# Patient Record
Sex: Female | Born: 1950
Health system: Southern US, Community
[De-identification: ages and names within clinical notes are randomized; demographics above are authoritative.]

## PROBLEM LIST (undated history)

## (undated) DIAGNOSIS — Z9289 Personal history of other medical treatment: Secondary | ICD-10-CM

## (undated) DIAGNOSIS — E079 Disorder of thyroid, unspecified: Secondary | ICD-10-CM

## (undated) DIAGNOSIS — J45909 Unspecified asthma, uncomplicated: Secondary | ICD-10-CM

## (undated) DIAGNOSIS — C55 Malignant neoplasm of uterus, part unspecified: Secondary | ICD-10-CM

## (undated) DIAGNOSIS — G709 Myoneural disorder, unspecified: Secondary | ICD-10-CM

## (undated) DIAGNOSIS — K219 Gastro-esophageal reflux disease without esophagitis: Secondary | ICD-10-CM

## (undated) DIAGNOSIS — E039 Hypothyroidism, unspecified: Secondary | ICD-10-CM

## (undated) DIAGNOSIS — M1990 Unspecified osteoarthritis, unspecified site: Secondary | ICD-10-CM

## (undated) DIAGNOSIS — I890 Lymphedema, not elsewhere classified: Secondary | ICD-10-CM

## (undated) HISTORY — PX: ABDOMINAL HYSTERECTOMY: SHX81

## (undated) HISTORY — PX: COLONOSCOPY W/ POLYPECTOMY: SHX1380

## (undated) HISTORY — PX: APPENDECTOMY: SHX54

## (undated) HISTORY — DX: Unspecified asthma, uncomplicated: J45.909

## (undated) HISTORY — DX: Disorder of thyroid, unspecified: E07.9

## (undated) HISTORY — PX: OTHER SURGICAL HISTORY: SHX169

## (undated) HISTORY — PX: LIVER BIOPSY: SHX301

## (undated) HISTORY — PX: EYE SURGERY: SHX253

## (undated) HISTORY — DX: Unspecified osteoarthritis, unspecified site: M19.90

## (undated) HISTORY — DX: Malignant neoplasm of uterus, part unspecified: C55

## (undated) HISTORY — DX: Myoneural disorder, unspecified: G70.9

---

## 2013-12-29 ENCOUNTER — Ambulatory Visit: Payer: Self-pay | Admitting: Gynecologic Oncology

## 2014-01-05 ENCOUNTER — Ambulatory Visit: Payer: Self-pay | Admitting: Gynecologic Oncology

## 2014-01-26 ENCOUNTER — Telehealth (HOSPITAL_COMMUNITY): Payer: Self-pay | Admitting: *Deleted

## 2015-05-28 ENCOUNTER — Encounter: Payer: Self-pay | Admitting: Gastroenterology

## 2015-06-03 ENCOUNTER — Encounter: Payer: Self-pay | Admitting: Gastroenterology

## 2015-06-03 ENCOUNTER — Other Ambulatory Visit (INDEPENDENT_AMBULATORY_CARE_PROVIDER_SITE_OTHER): Payer: BLUE CROSS/BLUE SHIELD

## 2015-06-03 ENCOUNTER — Ambulatory Visit (INDEPENDENT_AMBULATORY_CARE_PROVIDER_SITE_OTHER): Payer: BLUE CROSS/BLUE SHIELD | Admitting: Gastroenterology

## 2015-06-03 VITALS — BP 120/70 | HR 86 | Ht 62.0 in | Wt 163.8 lb

## 2015-06-03 DIAGNOSIS — R197 Diarrhea, unspecified: Secondary | ICD-10-CM

## 2015-06-03 DIAGNOSIS — C55 Malignant neoplasm of uterus, part unspecified: Secondary | ICD-10-CM

## 2015-06-03 DIAGNOSIS — R195 Other fecal abnormalities: Secondary | ICD-10-CM | POA: Diagnosis not present

## 2015-06-03 LAB — IGA: IgA: 108 mg/dL (ref 68–378)

## 2015-06-03 NOTE — Progress Notes (Signed)
HPI :  64 y/o female referred for heme-positive stool, diarrhea, and GERD from Dr. Shon Baton.  She denies blood in the stools. She had a (+) FOBT per Dr. Virgina Jock - resulted 05/17/15.  The patient reports she otheriwise has some bowel habit changes in relation to what she eats and stress levels. She reports episodes of diarrhea with urgency, after eating within 15-20 minutes she has an urge to have a BM, loose watery stools. She reports she has high frequency stools during this time. She has some abdominal cramping with this. No nausea or vomiting. She eats okay otherwise. She has some heartburn which bothers her, she thinks related to what she eats. No dysphagia. She has a strong gag reflux when she brushes her teeth which is chronic. She does not take any medications for heartburn at this time. She was on nexium remotely which she reported helped significantly, but she thinks it caused diarrhea at the time. She has had diarrhea ongoing for a few years at least. She reports most recently she had loose stools consistently for 3 weeks, and then some days of being symptom free. She has had some periods of time with nocturnal symptoms of loose stools that have woken her from sleep. She has had a few episodes of incontinence, unable to get to the restroom in time due to strong urge.   She has had synthroid started a few months ago. She has not noticed any changes in her bowels associated with this. She takes advil PRN, not routinely. She has lost a few lbs she thinks over the years with this symptom.   She has a history of uterine cancer, in remission, followed by oncology. She reports she has had multiple CT scans in the past year which have been normal and no concerning evidence for colon cancer or inflammation.   Last colonoscopy 2010 done in [place], report not available. She reports it was a normal exam. No FH of CRC. No FH of Crohns or celiac dz.   Past Medical History  Diagnosis Date  . Arthritis    . Uterine cancer ([place])   . Thyroid disease      Past Surgical History  Procedure Laterality Date  . Abdominal hysterectomy    . Appendectomy    . Liver biopsy    . Mass removed      2015 from pelvis   Family History  Problem Relation Age of Onset  . Heart disease Mother   . Multiple myeloma Mother    Social History  Substance Use Topics  . Smoking status: Never Smoker   . Smokeless tobacco: Never Used  . Alcohol Use: No   Current Outpatient Prescriptions  Medication Sig Dispense Refill  . levothyroxine (SYNTHROID, LEVOTHROID) 50 MCG tablet Take 50 mcg by mouth daily before breakfast.     No current facility-administered medications for this visit.   No Known Allergies   Review of Systems: All systems reviewed and negative except where noted in HPI.   L[facility]:  Labs 05/12/15  CBC - Hgb 13.3, MCV 96, WBC 4.7 Normal LFTs and renal panel  Physical Exam: BP 120/70 mmHg  Pulse 86  Ht 5' 2"  (1.575 m)  Wt 163 lb 12.8 oz (74.299 kg)  BMI 29.95 kg/m2  SpO2 97% Constitutional: Pleasant,well-developed, female in no acute distress. HEENT: Normocephalic and atraumatic. Conjunctivae are normal. No scleral icterus. Neck supple.  Cardiovascular: Normal rate, regular rhythm.  Pulmonary/chest: Effort normal and breath  sounds normal. No wheezing, rales or rhonchi. Abdominal: Soft, nondistended, nontender. Bowel sounds active throughout. There are no masses palpable. No hepatomegaly. Extremities: left leg (+) 1-2 edema, R leg with trace edema - stable per patient Lymphadenopathy: No cervical adenopathy noted. Neurological: Alert and oriented to person place and time. Skin: Skin is warm and dry. No rashes noted. Psychiatric: Normal mood and affect. Behavior is normal.   ASSESSMENT AND PLAN: 64 y/o female with a history of uterine cancer referred for (+) FOBT in the setting of diarrhea with increased fecal urgency. No overt rectal bleeding.  Symptoms of diarrhea have been longstanding.   I have discussed (+) FOBT and differential for this. I am recommending she have a colonoscopy to ensure no evidence of bleeding polyp / mass lesion, and to ensure no inflammatory changes / IBD in light of her symptoms. Following a discussion of the risks / benefits of colonoscopy, she did not want to go through with it and declined colonoscopy. I counseled her extensively on why I am recommending this in light of the FOBT, and she verbalized understanding, and declined. She reports she has had a lot of testing done for her uterine cancer and wants a "break" from procedures. She said she will think about it and let me know if she changes her mind. I provided her our contact information.   Otherwise, given her ongoing diarrhea, recommend celiac serology to ensure negative and I offered her a fecal calprotectin to assess for inflammation. If the fecal calprotectin is positive she said she may reconsider colonoscopy to rule out IBD. I otherwise counseled her on a low FODMOP diet, and she can try using immodium PRN for her symptoms. I will let her know results of labs. She will think about colonoscopy and let me know if and when she wishes to proceed with it.   [place] Cellar, MD [facility] Pager [phone]  CC: Dr. Shon Baton

## 2015-06-03 NOTE — Patient Instructions (Addendum)
Please start Low FODMAP diet.   Your physician has requested that you go to the basement for lab work before leaving today.  .It has been recommended to you by your physician that you have a(n) Colonoscopy completed. Per your request, we did not schedule the procedure(s) today. Please contact our office at [phone] should you decide to have the procedure completed.

## 2015-06-04 ENCOUNTER — Other Ambulatory Visit: Payer: BLUE CROSS/BLUE SHIELD

## 2015-06-04 DIAGNOSIS — R195 Other fecal abnormalities: Secondary | ICD-10-CM

## 2015-06-04 DIAGNOSIS — R197 Diarrhea, unspecified: Secondary | ICD-10-CM

## 2015-06-04 LAB — TISSUE TRANSGLUTAMINASE, IGA: Tissue Transglutaminase Ab, IgA: 1 U/mL (ref <4)

## 2015-06-17 ENCOUNTER — Telehealth: Payer: Self-pay | Admitting: Gastroenterology

## 2015-06-17 NOTE — Telephone Encounter (Signed)
Patient given lab results. Patient told that stool study is not back yet.

## 2015-06-23 LAB — CALPROTECTIN, FECAL: Calprotectin, Fecal: 52 ug/g (ref 0–120)

## 2016-05-19 ENCOUNTER — Telehealth (INDEPENDENT_AMBULATORY_CARE_PROVIDER_SITE_OTHER): Payer: Self-pay | Admitting: Orthopedic Surgery

## 2016-05-19 ENCOUNTER — Telehealth (INDEPENDENT_AMBULATORY_CARE_PROVIDER_SITE_OTHER): Payer: Self-pay | Admitting: Orthopaedic Surgery

## 2016-05-19 NOTE — Telephone Encounter (Signed)
Pt called would like to have hip replacement surgery before end of year but would also like cb with [phone]

## 2016-05-19 NOTE — Telephone Encounter (Signed)
Dana [name] sees Dr. Erlinda Hong for her hip.

## 2016-05-22 ENCOUNTER — Telehealth (INDEPENDENT_AMBULATORY_CARE_PROVIDER_SITE_OTHER): Payer: Self-pay | Admitting: Orthopaedic Surgery

## 2016-05-25 NOTE — Telephone Encounter (Signed)
I called patient and discuss surgery cost.   I made patient an appointment with Dr. Erlinda Hong to come in to further discuss.

## 2016-06-01 ENCOUNTER — Encounter (INDEPENDENT_AMBULATORY_CARE_PROVIDER_SITE_OTHER): Payer: Self-pay | Admitting: Orthopaedic Surgery

## 2016-06-01 ENCOUNTER — Encounter (INDEPENDENT_AMBULATORY_CARE_PROVIDER_SITE_OTHER): Payer: Self-pay

## 2016-06-01 ENCOUNTER — Ambulatory Visit (INDEPENDENT_AMBULATORY_CARE_PROVIDER_SITE_OTHER): Payer: BLUE CROSS/BLUE SHIELD | Admitting: Orthopaedic Surgery

## 2016-06-01 DIAGNOSIS — M1611 Unilateral primary osteoarthritis, right hip: Secondary | ICD-10-CM | POA: Insufficient documentation

## 2016-06-01 NOTE — Progress Notes (Signed)
Office Visit Note   Patient: Dana [name]           Date of Birth: [birth year]           MRN: [id number] Visit Date: 06/01/2016              Requested by: No referring provider defined for this encounter. PCP: Precious Reel, MD   Assessment & Plan: Visit Diagnoses:  1. Primary osteoarthritis of right hip     Plan:  Total face to face encounter time was greater than 25 minutes and over half of this time was spent in counseling and/or coordination of care. Today we had a long discussion about expectations from a hip replacement surgery including the risks benefits alternatives to surgery. She understands these risks and she wishes to proceed. I did show her on a model today with the surgery entails and where the incision would be placed. She denies a history of DVT. She would like to have the surgery done around Christmas time but before the end of the year. We will get her set up at her convenience.  Follow-Up Instructions: Return for will call patient to set up postop visit.   Orders:  No orders of the defined types were placed in this encounter.  No orders of the defined types were placed in this encounter.     Procedures: No procedures performed   Clinical Data: No additional findings.   Subjective: Chief Complaint  Patient presents with  . Right Hip - Pain    The patient follows up today for continued and worsening right hip pain related to her osteoarthritis. She is limping significantly. Her quality of life has deteriorated. She is interested in discussing surgery. She denies any back pain. She does endorse groin pain. The pain does not radiate.    Review of Systems  Constitutional: Negative.   HENT: Negative.   Eyes: Negative.   Respiratory: Negative.   Cardiovascular: Negative.   Endocrine: Negative.   Musculoskeletal: Negative.   Neurological: Negative.   Hematological: Negative.   Psychiatric/Behavioral: Negative.   All other systems reviewed and are  negative.    Objective: Vital Signs: There were no vitals taken for this visit.  Physical Exam  Constitutional: She is oriented to person, place, and time. She appears well-developed and well-nourished.  HENT:  Head: Atraumatic.  Eyes: EOM are normal.  Neck: Neck supple.  Cardiovascular: Intact distal pulses.   Pulmonary/Chest: Effort normal.  Abdominal: Soft.  Neurological: She is alert and oriented to person, place, and time.  Skin: Skin is warm. Capillary refill takes less than 2 seconds.  Psychiatric: She has a normal mood and affect. Her behavior is normal. Judgment and thought content normal.  Nursing note and vitals reviewed.   Ortho Exam Examination of the right hip shows no skin changes. She has significant pain with rotation of her hip. She has a positive Stinchfield sign. No sciatic tension signs. No radicular signs. The right lower extremity is neurovascularly intact. There is no knee pain. Specialty Comments:  No specialty comments available.  Imaging: Previous pelvis x-ray shows advanced arthritis of the right hip.   PMFS History: Patient Active Problem List   Diagnosis Date Noted  . Primary osteoarthritis of right hip 06/01/2016  . Uterine cancer ([place]) 06/03/2015  . Diarrhea 06/03/2015   Past Medical History:  Diagnosis Date  . Arthritis   . Thyroid disease   . Uterine cancer [facility])     Family History  Problem Relation  Age of Onset  . Heart disease Mother   . Multiple myeloma Mother     Past Surgical History:  Procedure Laterality Date  . ABDOMINAL HYSTERECTOMY    . APPENDECTOMY    . LIVER BIOPSY    . mass removed     2015 from pelvis   Social History   Occupational History  . food Nature conservation officer    Social History Main Topics  . Smoking status: Never Smoker  . Smokeless tobacco: Never Used  . Alcohol use No  . Drug use: No  . Sexual activity: Not on file

## 2016-06-02 ENCOUNTER — Ambulatory Visit (INDEPENDENT_AMBULATORY_CARE_PROVIDER_SITE_OTHER): Payer: Self-pay | Admitting: Orthopaedic Surgery

## 2016-06-05 ENCOUNTER — Other Ambulatory Visit: Payer: Self-pay | Admitting: Internal Medicine

## 2016-06-05 DIAGNOSIS — E010 Iodine-deficiency related diffuse (endemic) goiter: Secondary | ICD-10-CM

## 2016-06-13 ENCOUNTER — Ambulatory Visit
Admission: RE | Admit: 2016-06-13 | Discharge: 2016-06-13 | Disposition: A | Discharge status: Home / Self Care | Payer: BLUE CROSS/BLUE SHIELD | Source: Ambulatory Visit | Attending: Internal Medicine | Admitting: Internal Medicine

## 2016-06-13 DIAGNOSIS — E010 Iodine-deficiency related diffuse (endemic) goiter: Secondary | ICD-10-CM

## 2016-06-21 ENCOUNTER — Other Ambulatory Visit (INDEPENDENT_AMBULATORY_CARE_PROVIDER_SITE_OTHER): Payer: Self-pay | Admitting: Orthopaedic Surgery

## 2016-06-21 DIAGNOSIS — M1612 Unilateral primary osteoarthritis, left hip: Secondary | ICD-10-CM

## 2016-06-22 ENCOUNTER — Telehealth (INDEPENDENT_AMBULATORY_CARE_PROVIDER_SITE_OTHER): Payer: Self-pay | Admitting: Orthopaedic Surgery

## 2016-06-22 NOTE — Telephone Encounter (Signed)
Patient called asked if the FMLA papers were received  From [facility] claims management services.  The number to contact her is [phone]

## 2016-06-23 NOTE — Telephone Encounter (Signed)
Called pt no answer LMOM we received forms and I will fill out and fax with notes.

## 2016-06-26 ENCOUNTER — Other Ambulatory Visit (HOSPITAL_COMMUNITY): Payer: BLUE CROSS/BLUE SHIELD

## 2016-06-30 ENCOUNTER — Encounter (HOSPITAL_COMMUNITY): Payer: Self-pay

## 2016-07-03 ENCOUNTER — Other Ambulatory Visit (INDEPENDENT_AMBULATORY_CARE_PROVIDER_SITE_OTHER): Payer: Self-pay | Admitting: Orthopaedic Surgery

## 2016-07-03 ENCOUNTER — Encounter (HOSPITAL_COMMUNITY): Payer: Self-pay

## 2016-07-03 ENCOUNTER — Encounter (HOSPITAL_COMMUNITY)
Admission: RE | Admit: 2016-07-03 | Discharge: 2016-07-03 | Disposition: A | Discharge status: Home / Self Care | Payer: BLUE CROSS/BLUE SHIELD | Source: Ambulatory Visit | Attending: Orthopaedic Surgery | Admitting: Orthopaedic Surgery

## 2016-07-03 DIAGNOSIS — K219 Gastro-esophageal reflux disease without esophagitis: Secondary | ICD-10-CM | POA: Insufficient documentation

## 2016-07-03 DIAGNOSIS — E079 Disorder of thyroid, unspecified: Secondary | ICD-10-CM | POA: Insufficient documentation

## 2016-07-03 DIAGNOSIS — Z01812 Encounter for preprocedural laboratory examination: Secondary | ICD-10-CM | POA: Diagnosis present

## 2016-07-03 DIAGNOSIS — R197 Diarrhea, unspecified: Secondary | ICD-10-CM | POA: Diagnosis not present

## 2016-07-03 DIAGNOSIS — C55 Malignant neoplasm of uterus, part unspecified: Secondary | ICD-10-CM | POA: Insufficient documentation

## 2016-07-03 DIAGNOSIS — M1611 Unilateral primary osteoarthritis, right hip: Secondary | ICD-10-CM | POA: Insufficient documentation

## 2016-07-03 HISTORY — DX: Personal history of other medical treatment: Z92.89

## 2016-07-03 HISTORY — DX: Lymphedema, not elsewhere classified: I89.0

## 2016-07-03 HISTORY — DX: Gastro-esophageal reflux disease without esophagitis: K21.9

## 2016-07-03 HISTORY — DX: Hypothyroidism, unspecified: E03.9

## 2016-07-03 LAB — COMPREHENSIVE METABOLIC PANEL
ALT: 14 U/L (ref 14–54)
AST: 17 U/L (ref 15–41)
Albumin: 3.7 g/dL (ref 3.5–5.0)
Alkaline Phosphatase: 63 U/L (ref 38–126)
Anion gap: 7 (ref 5–15)
BUN: 14 mg/dL (ref 6–20)
CO2: 27 mmol/L (ref 22–32)
Calcium: 9.2 mg/dL (ref 8.9–10.3)
Chloride: 104 mmol/L (ref 101–111)
Creatinine, Ser: 0.87 mg/dL (ref 0.44–1.00)
GFR calc Af Amer: >60 mL/min (ref >60)
GFR calc non Af Amer: >60 mL/min (ref >60)
Glucose, Bld: 92 mg/dL (ref 65–99)
Potassium: 4.4 mmol/L (ref 3.5–5.1)
Sodium: 138 mmol/L (ref 135–145)
Total Bilirubin: 0.8 mg/dL (ref 0.3–1.2)
Total Protein: 6.1 g/dL — ABNORMAL LOW (ref 6.5–8.1)

## 2016-07-03 LAB — CBC WITH DIFFERENTIAL/PLATELET
Basophils Absolute: 0 10*3/uL (ref 0.0–0.1)
Basophils Relative: 1 %
Eosinophils Absolute: 0.1 10*3/uL (ref 0.0–0.7)
Eosinophils Relative: 2 %
HCT: 42.4 % (ref 36.0–46.0)
Hemoglobin: 14 g/dL (ref 12.0–15.0)
Lymphocytes Relative: 31 %
Lymphs Abs: 1.9 10*3/uL (ref 0.7–4.0)
MCH: 30.4 pg (ref 26.0–34.0)
MCHC: 33 g/dL (ref 30.0–36.0)
MCV: 92 fL (ref 78.0–100.0)
Monocytes Absolute: 0.5 10*3/uL (ref 0.1–1.0)
Monocytes Relative: 8 %
Neutro Abs: 3.5 10*3/uL (ref 1.7–7.7)
Neutrophils Relative %: 58 %
Platelets: 262 10*3/uL (ref 150–400)
RBC: 4.61 MIL/uL (ref 3.87–5.11)
RDW: 13.5 % (ref 11.5–15.5)
WBC: 6 10*3/uL (ref 4.0–10.5)

## 2016-07-03 LAB — URINALYSIS, ROUTINE W REFLEX MICROSCOPIC
Bacteria, UA: NONE SEEN
Bilirubin Urine: NEGATIVE
Glucose, UA: NEGATIVE mg/dL
Ketones, ur: NEGATIVE mg/dL
Leukocytes, UA: NEGATIVE
Nitrite: NEGATIVE
Protein, ur: NEGATIVE mg/dL
Specific Gravity, Urine: 1.016 (ref 1.005–1.030)
Squamous Epithelial / LPF: NONE SEEN
pH: 5 (ref 5.0–8.0)

## 2016-07-03 LAB — APTT: aPTT: 31 seconds (ref 24–36)

## 2016-07-03 LAB — SURGICAL PCR SCREEN
MRSA, PCR: NEGATIVE
Staphylococcus aureus: NEGATIVE

## 2016-07-03 LAB — C-REACTIVE PROTEIN: CRP: <0.8 mg/dL (ref <1.0)

## 2016-07-03 LAB — TYPE AND SCREEN
ABO/RH(D): O POS
Antibody Screen: NEGATIVE

## 2016-07-03 LAB — PROTIME-INR
INR: 1
Prothrombin Time: 13.2 seconds (ref 11.4–15.2)

## 2016-07-03 LAB — ABO/RH: ABO/RH(D): O POS

## 2016-07-03 LAB — SEDIMENTATION RATE: Sed Rate: 6 mm/hr (ref 0–22)

## 2016-07-03 NOTE — Pre-Procedure Instructions (Addendum)
    Dana [name]  07/03/2016    Your procedure is scheduled on Thursday, December 28.  Report to [facility] Admitting at 10:15 AM              Your surgery or procedure is scheduled for 12:15 PM   Call this number if you have problems the morning of surgery: [phone]   Remember:  Do not eat food or drink liquids after midnight December 27.  Take these medicines the morning of surgery with A SIP OF WATER: levothyroxine (SYNTHROID, LEVOTHROID)     1 Week prior to surgery STOP taking Aspirin, Aspirin Products (Goody Powder, Excedrin Migraine), Ibuprofen (Advil), Naproxen (Aleve), Vitamins and Herbal Products (ie Fish Oil)  Do not wear jewelry, make-up or nail polish.  Do not wear lotions, powders, or perfumes, or deodorant.  Do not shave 48 hours prior to surgery.    Do not bring valuables to the hospital.  [facility] is not responsible for any belongings or valuables.  Contacts, dentures or bridgework may not be worn into surgery.  Leave your suitcase in the car.  After surgery it may be brought to your room.  For patients admitted to the hospital, discharge time will be determined by your treatment team.  Special instructions:  Review  [facility] - Preparing For Surgery.  Please read over the following fact sheets that you were given: .[facility]- Preparing For Surgery and Patient Instructions for Mupirocin Application, Incentive Spirometry, Pain Booklet

## 2016-07-03 NOTE — Progress Notes (Signed)
Dana [name] has Lymphoedema in left leg, TED hose make it worse.I called and left a message for Dr Erlinda Hong, with his scheduler to see if he wants her to wear TED hose on right leg only.

## 2016-07-07 ENCOUNTER — Telehealth (INDEPENDENT_AMBULATORY_CARE_PROVIDER_SITE_OTHER): Payer: Self-pay | Admitting: Orthopaedic Surgery

## 2016-07-07 NOTE — Telephone Encounter (Signed)
Pt requesting back about her fmla forms they have some sort of urgency. Her number is  [phone]

## 2016-07-12 MED ORDER — SODIUM CHLORIDE 0.9 % IV SOLN
1000.0000 mg | INTRAVENOUS | Status: AC
  Administered 2016-07-13: 1000 mg via INTRAVENOUS
  Filled 2016-07-12: qty 10

## 2016-07-12 NOTE — Telephone Encounter (Signed)
Pt called requesting FMLA papers. She is having surgery tomorrow. Please call pt when forms are ready for pick up.

## 2016-07-12 NOTE — Telephone Encounter (Signed)
Called pt forms are done. She would like for me to fax and also mail her a copy.

## 2016-07-13 ENCOUNTER — Inpatient Hospital Stay (HOSPITAL_COMMUNITY): Payer: BLUE CROSS/BLUE SHIELD | Admitting: Certified Registered"

## 2016-07-13 ENCOUNTER — Encounter (HOSPITAL_COMMUNITY): Payer: Self-pay | Admitting: *Deleted

## 2016-07-13 ENCOUNTER — Inpatient Hospital Stay (HOSPITAL_COMMUNITY)
Admission: RE | Admit: 2016-07-13 | Discharge: 2016-07-15 | DRG: 470 | Disposition: A | Discharge status: Home Health | Payer: BLUE CROSS/BLUE SHIELD | Source: Ambulatory Visit | Attending: Orthopaedic Surgery | Admitting: Orthopaedic Surgery

## 2016-07-13 ENCOUNTER — Encounter (HOSPITAL_COMMUNITY)
Admission: RE | Disposition: A | Discharge status: Home Health | Payer: Self-pay | Source: Ambulatory Visit | Attending: Orthopaedic Surgery

## 2016-07-13 ENCOUNTER — Inpatient Hospital Stay (HOSPITAL_COMMUNITY): Payer: BLUE CROSS/BLUE SHIELD

## 2016-07-13 DIAGNOSIS — E039 Hypothyroidism, unspecified: Secondary | ICD-10-CM | POA: Diagnosis present

## 2016-07-13 DIAGNOSIS — Z91018 Allergy to other foods: Secondary | ICD-10-CM | POA: Diagnosis not present

## 2016-07-13 DIAGNOSIS — Z96649 Presence of unspecified artificial hip joint: Secondary | ICD-10-CM

## 2016-07-13 DIAGNOSIS — R110 Nausea: Secondary | ICD-10-CM | POA: Diagnosis not present

## 2016-07-13 DIAGNOSIS — Z79899 Other long term (current) drug therapy: Secondary | ICD-10-CM

## 2016-07-13 DIAGNOSIS — I890 Lymphedema, not elsewhere classified: Secondary | ICD-10-CM | POA: Diagnosis present

## 2016-07-13 DIAGNOSIS — D62 Acute posthemorrhagic anemia: Secondary | ICD-10-CM | POA: Diagnosis not present

## 2016-07-13 DIAGNOSIS — Z8249 Family history of ischemic heart disease and other diseases of the circulatory system: Secondary | ICD-10-CM | POA: Diagnosis not present

## 2016-07-13 DIAGNOSIS — Z880 Allergy status to penicillin: Secondary | ICD-10-CM | POA: Diagnosis not present

## 2016-07-13 DIAGNOSIS — Z91011 Allergy to milk products: Secondary | ICD-10-CM | POA: Diagnosis not present

## 2016-07-13 DIAGNOSIS — K219 Gastro-esophageal reflux disease without esophagitis: Secondary | ICD-10-CM | POA: Diagnosis present

## 2016-07-13 DIAGNOSIS — R262 Difficulty in walking, not elsewhere classified: Secondary | ICD-10-CM

## 2016-07-13 DIAGNOSIS — M1611 Unilateral primary osteoarthritis, right hip: Secondary | ICD-10-CM | POA: Diagnosis present

## 2016-07-13 DIAGNOSIS — M1612 Unilateral primary osteoarthritis, left hip: Secondary | ICD-10-CM

## 2016-07-13 DIAGNOSIS — Z419 Encounter for procedure for purposes other than remedying health state, unspecified: Secondary | ICD-10-CM

## 2016-07-13 DIAGNOSIS — Z8542 Personal history of malignant neoplasm of other parts of uterus: Secondary | ICD-10-CM | POA: Diagnosis not present

## 2016-07-13 HISTORY — PX: TOTAL HIP ARTHROPLASTY: SHX124

## 2016-07-13 SURGERY — ARTHROPLASTY, HIP, TOTAL, ANTERIOR APPROACH
Anesthesia: Spinal | Site: Hip | Laterality: Right

## 2016-07-13 MED ORDER — METHOCARBAMOL 750 MG PO TABS: 750.0000 mg | ORAL_TABLET | Freq: Two times a day (BID) | ORAL | 0 refills | Status: DC | PRN

## 2016-07-13 MED ORDER — SODIUM CHLORIDE 0.9% FLUSH
INTRAVENOUS | Status: DC | PRN
  Administered 2016-07-13: 30 mL via INTRAVENOUS

## 2016-07-13 MED ORDER — ALUM & MAG HYDROXIDE-SIMETH 200-200-20 MG/5ML PO SUSP: 30.0000 mL | ORAL | Status: DC | PRN

## 2016-07-13 MED ORDER — PROPOFOL 500 MG/50ML IV EMUL
INTRAVENOUS | Status: DC | PRN
  Administered 2016-07-13: 75 ug/kg/min via INTRAVENOUS

## 2016-07-13 MED ORDER — VITAMIN D 1000 UNITS PO TABS
4000.0000 [IU] | ORAL_TABLET | Freq: Every day | ORAL | Status: DC
  Administered 2016-07-14 – 2016-07-15 (×2): 4000 [IU] via ORAL
  Filled 2016-07-13 (×2): qty 4

## 2016-07-13 MED ORDER — BUPIVACAINE LIPOSOME 1.3 % IJ SUSP
20.0000 mL | INTRAMUSCULAR | Status: AC
  Administered 2016-07-13: 10 mL
  Filled 2016-07-13: qty 20

## 2016-07-13 MED ORDER — METHOCARBAMOL 500 MG PO TABS
500.0000 mg | ORAL_TABLET | Freq: Four times a day (QID) | ORAL | Status: DC | PRN
  Filled 2016-07-13: qty 1

## 2016-07-13 MED ORDER — LIDOCAINE 2% (20 MG/ML) 5 ML SYRINGE
INTRAMUSCULAR | Status: AC
  Filled 2016-07-13: qty 5

## 2016-07-13 MED ORDER — MAGNESIUM CITRATE PO SOLN: 1.0000 | Freq: Once | ORAL | Status: DC | PRN

## 2016-07-13 MED ORDER — SENNOSIDES-DOCUSATE SODIUM 8.6-50 MG PO TABS: 1.0000 | ORAL_TABLET | Freq: Every evening | ORAL | 1 refills | Status: DC | PRN

## 2016-07-13 MED ORDER — PROMETHAZINE HCL 25 MG/ML IJ SOLN
6.2500 mg | INTRAMUSCULAR | Status: DC | PRN
  Administered 2016-07-13: 12.5 mg via INTRAVENOUS

## 2016-07-13 MED ORDER — SODIUM CHLORIDE 0.9 % IV SOLN
INTRAVENOUS | Status: DC
  Administered 2016-07-13 – 2016-07-14 (×2): via INTRAVENOUS

## 2016-07-13 MED ORDER — VANCOMYCIN HCL IN DEXTROSE 1-5 GM/200ML-% IV SOLN
1000.0000 mg | Freq: Two times a day (BID) | INTRAVENOUS | Status: AC
  Administered 2016-07-13 – 2016-07-14 (×2): 1000 mg via INTRAVENOUS
  Filled 2016-07-13 (×2): qty 200

## 2016-07-13 MED ORDER — FENTANYL CITRATE (PF) 100 MCG/2ML IJ SOLN
INTRAMUSCULAR | Status: AC
  Filled 2016-07-13: qty 2

## 2016-07-13 MED ORDER — ONDANSETRON HCL 4 MG PO TABS: 4.0000 mg | ORAL_TABLET | Freq: Four times a day (QID) | ORAL | Status: DC | PRN

## 2016-07-13 MED ORDER — DEXAMETHASONE SODIUM PHOSPHATE 10 MG/ML IJ SOLN
10.0000 mg | Freq: Once | INTRAMUSCULAR | Status: AC
  Administered 2016-07-14: 10 mg via INTRAVENOUS
  Filled 2016-07-13: qty 1

## 2016-07-13 MED ORDER — OXYCODONE HCL 5 MG PO TABS
ORAL_TABLET | ORAL | Status: AC
  Filled 2016-07-13: qty 1

## 2016-07-13 MED ORDER — HYDROMORPHONE HCL 1 MG/ML IJ SOLN: 0.2500 mg | INTRAMUSCULAR | Status: DC | PRN

## 2016-07-13 MED ORDER — SODIUM CHLORIDE 0.9 % IV SOLN
2000.0000 mg | INTRAVENOUS | Status: DC
  Filled 2016-07-13: qty 20

## 2016-07-13 MED ORDER — PHENOL 1.4 % MT LIQD: 1.0000 | OROMUCOSAL | Status: DC | PRN

## 2016-07-13 MED ORDER — CEFAZOLIN SODIUM-DEXTROSE 2-4 GM/100ML-% IV SOLN
2.0000 g | INTRAVENOUS | Status: AC
  Administered 2016-07-13: 2 g via INTRAVENOUS
  Filled 2016-07-13: qty 100

## 2016-07-13 MED ORDER — SORBITOL 70 % SOLN
30.0000 mL | Freq: Every day | Status: DC | PRN
Start: 2016-07-13 — End: 2016-07-15

## 2016-07-13 MED ORDER — POLYETHYLENE GLYCOL 3350 17 G PO PACK: 17.0000 g | PACK | Freq: Every day | ORAL | Status: DC | PRN

## 2016-07-13 MED ORDER — OXYCODONE HCL ER 10 MG PO T12A: 10.0000 mg | EXTENDED_RELEASE_TABLET | Freq: Two times a day (BID) | ORAL | Status: DC

## 2016-07-13 MED ORDER — FENTANYL CITRATE (PF) 100 MCG/2ML IJ SOLN
INTRAMUSCULAR | Status: DC | PRN
  Administered 2016-07-13: 100 ug via INTRAVENOUS

## 2016-07-13 MED ORDER — ACETAMINOPHEN 650 MG RE SUPP: 650.0000 mg | Freq: Four times a day (QID) | RECTAL | Status: DC | PRN

## 2016-07-13 MED ORDER — PHENYLEPHRINE HCL 10 MG/ML IJ SOLN
INTRAMUSCULAR | Status: DC | PRN
  Administered 2016-07-13: 50 ug/min via INTRAVENOUS

## 2016-07-13 MED ORDER — SODIUM CHLORIDE 0.9 % IR SOLN
Status: DC | PRN
  Administered 2016-07-13: 3000 mL

## 2016-07-13 MED ORDER — LACTATED RINGERS IV SOLN
INTRAVENOUS | Status: DC
  Administered 2016-07-13: 10:00:00 via INTRAVENOUS

## 2016-07-13 MED ORDER — ASPIRIN EC 325 MG PO TBEC: 325.0000 mg | DELAYED_RELEASE_TABLET | Freq: Two times a day (BID) | ORAL | 0 refills | Status: DC

## 2016-07-13 MED ORDER — CHLORHEXIDINE GLUCONATE 4 % EX LIQD: 60.0000 mL | Freq: Once | CUTANEOUS | Status: DC

## 2016-07-13 MED ORDER — OXYCODONE HCL ER 10 MG PO T12A: 10.0000 mg | EXTENDED_RELEASE_TABLET | Freq: Two times a day (BID) | ORAL | 0 refills | Status: DC

## 2016-07-13 MED ORDER — MENTHOL 3 MG MT LOZG: 1.0000 | LOZENGE | OROMUCOSAL | Status: DC | PRN

## 2016-07-13 MED ORDER — LEVOTHYROXINE SODIUM 50 MCG PO TABS
50.0000 ug | ORAL_TABLET | Freq: Every day | ORAL | Status: DC
  Administered 2016-07-14 – 2016-07-15 (×2): 50 ug via ORAL
  Filled 2016-07-13 (×2): qty 1

## 2016-07-13 MED ORDER — PROMETHAZINE HCL 25 MG PO TABS: 25.0000 mg | ORAL_TABLET | Freq: Four times a day (QID) | ORAL | 1 refills | Status: DC | PRN

## 2016-07-13 MED ORDER — LIDOCAINE 2% (20 MG/ML) 5 ML SYRINGE
INTRAMUSCULAR | Status: DC | PRN
  Administered 2016-07-13: 60 mg via INTRAVENOUS

## 2016-07-13 MED ORDER — ONDANSETRON HCL 4 MG/2ML IJ SOLN
4.0000 mg | Freq: Four times a day (QID) | INTRAMUSCULAR | Status: DC | PRN
Start: 2016-07-13 — End: 2016-07-15
  Administered 2016-07-13: 4 mg via INTRAVENOUS
  Filled 2016-07-13: qty 2

## 2016-07-13 MED ORDER — METHOCARBAMOL 1000 MG/10ML IJ SOLN
500.0000 mg | Freq: Four times a day (QID) | INTRAVENOUS | Status: DC | PRN
  Filled 2016-07-13: qty 5

## 2016-07-13 MED ORDER — MIDAZOLAM HCL 2 MG/2ML IJ SOLN
INTRAMUSCULAR | Status: AC
  Filled 2016-07-13: qty 2

## 2016-07-13 MED ORDER — BISMUTH SUBSALICYLATE 262 MG PO CHEW
262.0000 mg | CHEWABLE_TABLET | Freq: Every day | ORAL | Status: DC | PRN
  Filled 2016-07-13: qty 1

## 2016-07-13 MED ORDER — MORPHINE SULFATE (PF) 2 MG/ML IV SOLN
1.0000 mg | INTRAVENOUS | Status: DC | PRN
  Administered 2016-07-13 – 2016-07-14 (×2): 1 mg via INTRAVENOUS
  Filled 2016-07-13 (×2): qty 1

## 2016-07-13 MED ORDER — ASPIRIN EC 325 MG PO TBEC
325.0000 mg | DELAYED_RELEASE_TABLET | Freq: Two times a day (BID) | ORAL | Status: DC
  Administered 2016-07-14 – 2016-07-15 (×3): 325 mg via ORAL
  Filled 2016-07-13 (×5): qty 1

## 2016-07-13 MED ORDER — OXYCODONE HCL 5 MG PO TABS: 5.0000 mg | ORAL_TABLET | ORAL | 0 refills | Status: DC | PRN

## 2016-07-13 MED ORDER — 0.9 % SODIUM CHLORIDE (POUR BTL) OPTIME
TOPICAL | Status: DC | PRN
  Administered 2016-07-13: 1000 mL

## 2016-07-13 MED ORDER — ACETAMINOPHEN 500 MG PO TABS
1000.0000 mg | ORAL_TABLET | Freq: Four times a day (QID) | ORAL | Status: AC
  Administered 2016-07-13 – 2016-07-14 (×3): 1000 mg via ORAL
  Filled 2016-07-13 (×3): qty 2

## 2016-07-13 MED ORDER — DIPHENHYDRAMINE HCL 12.5 MG/5ML PO ELIX: 25.0000 mg | ORAL_SOLUTION | ORAL | Status: DC | PRN

## 2016-07-13 MED ORDER — KETOROLAC TROMETHAMINE 30 MG/ML IJ SOLN
30.0000 mg | Freq: Four times a day (QID) | INTRAMUSCULAR | Status: DC | PRN
  Administered 2016-07-13: 30 mg via INTRAVENOUS
  Filled 2016-07-13: qty 1

## 2016-07-13 MED ORDER — MIDAZOLAM HCL 5 MG/5ML IJ SOLN
INTRAMUSCULAR | Status: DC | PRN
  Administered 2016-07-13: 2 mg via INTRAVENOUS

## 2016-07-13 MED ORDER — PROMETHAZINE HCL 25 MG/ML IJ SOLN
INTRAMUSCULAR | Status: AC
  Filled 2016-07-13: qty 1

## 2016-07-13 MED ORDER — TRANEXAMIC ACID 1000 MG/10ML IV SOLN
INTRAVENOUS | Status: DC | PRN
  Administered 2016-07-13: 2000 mg via TOPICAL

## 2016-07-13 MED ORDER — ONDANSETRON HCL 4 MG PO TABS: 4.0000 mg | ORAL_TABLET | Freq: Three times a day (TID) | ORAL | 0 refills | Status: DC | PRN

## 2016-07-13 MED ORDER — METOCLOPRAMIDE HCL 5 MG PO TABS: 5.0000 mg | ORAL_TABLET | Freq: Three times a day (TID) | ORAL | Status: DC | PRN

## 2016-07-13 MED ORDER — OXYCODONE HCL 5 MG PO TABS
5.0000 mg | ORAL_TABLET | ORAL | Status: DC | PRN
  Administered 2016-07-13: 5 mg via ORAL
  Filled 2016-07-13: qty 3

## 2016-07-13 MED ORDER — ACETAMINOPHEN 325 MG PO TABS
650.0000 mg | ORAL_TABLET | Freq: Four times a day (QID) | ORAL | Status: DC | PRN
  Administered 2016-07-14: 650 mg via ORAL
  Filled 2016-07-13: qty 2

## 2016-07-13 MED ORDER — METOCLOPRAMIDE HCL 5 MG/ML IJ SOLN: 5.0000 mg | Freq: Three times a day (TID) | INTRAMUSCULAR | Status: DC | PRN

## 2016-07-13 SURGICAL SUPPLY — 52 items
BAG DECANTER FOR FLEXI CONT (MISCELLANEOUS) ×4 IMPLANT
CAPT HIP TOTAL 2 ×2 IMPLANT
CELLS DAT CNTRL 66122 CELL SVR (MISCELLANEOUS) ×1 IMPLANT
COVER SURGICAL LIGHT HANDLE (MISCELLANEOUS) ×4 IMPLANT
DRAPE C-ARM 42X72 X-RAY (DRAPES) ×2 IMPLANT
DRAPE STERI IOBAN 125X83 (DRAPES) ×2 IMPLANT
DRAPE U-SHAPE 47X51 STRL (DRAPES) ×6 IMPLANT
DRSG AQUACEL AG ADV 3.5X10 (GAUZE/BANDAGES/DRESSINGS) ×2 IMPLANT
DRSG MEPILEX BORDER 4X8 (GAUZE/BANDAGES/DRESSINGS) ×2 IMPLANT
DURAPREP 26ML APPLICATOR (WOUND CARE) ×2 IMPLANT
ELECT BLADE 4.0 EZ CLEAN MEGAD (MISCELLANEOUS) ×2
ELECT REM PT RETURN 9FT ADLT (ELECTROSURGICAL) ×2
ELECTRODE BLDE 4.0 EZ CLN MEGD (MISCELLANEOUS) ×1 IMPLANT
ELECTRODE REM PT RTRN 9FT ADLT (ELECTROSURGICAL) ×1 IMPLANT
GLOVE SKINSENSE NS SZ7.5 (GLOVE) ×1
GLOVE SKINSENSE STRL SZ7.5 (GLOVE) ×1 IMPLANT
GLOVE SURG SYN 7.5  E (GLOVE) ×5
GLOVE SURG SYN 7.5 E (GLOVE) ×5 IMPLANT
GOWN SRG XL XLNG 56XLVL 4 (GOWN DISPOSABLE) ×1 IMPLANT
GOWN STRL NON-REIN XL XLG LVL4 (GOWN DISPOSABLE) ×1
GOWN STRL REUS W/ TWL LRG LVL3 (GOWN DISPOSABLE) IMPLANT
GOWN STRL REUS W/TWL LRG LVL3 (GOWN DISPOSABLE)
HANDPIECE INTERPULSE COAX TIP (DISPOSABLE) ×1
HOOD PEEL AWAY FLYTE STAYCOOL (MISCELLANEOUS) ×4 IMPLANT
IV NS 1000ML (IV SOLUTION) ×1
IV NS 1000ML BAXH (IV SOLUTION) ×1 IMPLANT
IV NS IRRIG 3000ML ARTHROMATIC (IV SOLUTION) ×2 IMPLANT
KIT BASIN OR (CUSTOM PROCEDURE TRAY) ×2 IMPLANT
MARKER SKIN DUAL TIP RULER LAB (MISCELLANEOUS) ×2 IMPLANT
NEEDLE SPNL 18GX3.5 QUINCKE PK (NEEDLE) ×2 IMPLANT
PACK TOTAL JOINT (CUSTOM PROCEDURE TRAY) ×2 IMPLANT
PACK UNIVERSAL I (CUSTOM PROCEDURE TRAY) ×2 IMPLANT
RTRCTR WOUND ALEXIS 18CM MED (MISCELLANEOUS) ×2
SAW OSC TIP CART 19.5X105X1.3 (SAW) ×2 IMPLANT
SEALER BIPOLAR AQUA 6.0 (INSTRUMENTS) ×2 IMPLANT
SET HNDPC FAN SPRY TIP SCT (DISPOSABLE) ×1 IMPLANT
STAPLER VISISTAT 35W (STAPLE) IMPLANT
STRIP CLOSURE SKIN 1/2X4 (GAUZE/BANDAGES/DRESSINGS) ×2 IMPLANT
SUT ETHIBOND 2 V 37 (SUTURE) ×2 IMPLANT
SUT ETHIBOND NAB CT1 #1 30IN (SUTURE) ×6 IMPLANT
SUT MNCRL AB 4-0 PS2 18 (SUTURE) ×2 IMPLANT
SUT VIC AB 0 CT1 27 (SUTURE) ×1
SUT VIC AB 0 CT1 27XBRD ANBCTR (SUTURE) ×1 IMPLANT
SUT VIC AB 1 CT1 27 (SUTURE) ×1
SUT VIC AB 1 CT1 27XBRD ANBCTR (SUTURE) ×1 IMPLANT
SUT VIC AB 2-0 CT1 27 (SUTURE) ×1
SUT VIC AB 2-0 CT1 TAPERPNT 27 (SUTURE) ×1 IMPLANT
SYR 20CC LL (SYRINGE) ×2 IMPLANT
SYR 50ML LL SCALE MARK (SYRINGE) ×2 IMPLANT
TOWEL OR 17X26 10 PK STRL BLUE (TOWEL DISPOSABLE) ×2 IMPLANT
TRAY CATH 16FR W/PLASTIC CATH (SET/KITS/TRAYS/PACK) ×2 IMPLANT
YANKAUER SUCT BULB TIP NO VENT (SUCTIONS) ×2 IMPLANT

## 2016-07-13 NOTE — Op Note (Signed)
RIGHT TOTAL HIP ARTHROPLASTY ANTERIOR APPROACH  Procedure Note MILDRID MYRICK   IC:[id number]  Pre-op Diagnosis: right hip degenerative joint disease     Post-op Diagnosis: same   Operative Procedures  1. Total hip replacement; Right hip; uncemented [id number]   Personnel  Surgeon(s): Leandrew Koyanagi, MD   Anesthesia: spinal  Prosthesis: Depuy Acetabulum: Pinnacle 52 mm Femur: Corail KA 14 Head: 36 size: +1.5 Liner: +4 Bearing Type: ceramic on poly  Date of Service: 07/13/2016  Total Hip Arthroplasty (Anterior Approach) Op Note:  After informed consent was obtained and the operative extremity marked in the holding area, the patient was brought back to the operating room and placed supine on the HANA table. Next, the operative extremity was prepped and draped in normal sterile fashion. Surgical timeout occurred verifying patient identification, surgical site, surgical procedure and administration of antibiotics.  A modified anterior Smith-Peterson approach to the hip was performed, using the interval between tensor fascia lata and sartorius.  Dissection was carried bluntly down onto the anterior hip capsule. The lateral femoral circumflex vessels were identified and coagulated. A capsulotomy was performed and the capsular flaps tagged for later repair.  Fluoroscopy was utilized to prepare for the femoral neck cut. The neck osteotomy was performed. The femoral head was removed, the acetabular rim was cleared of soft tissue and attention was turned to reaming the acetabulum.  Sequential reaming was performed under fluoroscopic guidance. We reamed to a size 51 mm, and then impacted the acetabular shell. The liner was then placed after irrigation and attention turned to the femur.  After placing the femoral hook, the leg was taken to externally rotated, extended and adducted position taking care to perform soft tissue releases to allow for adequate mobilization of the femur. Soft tissue was  cleared from the shoulder of the greater trochanter and the hook elevator used to improve exposure of the proximal femur. Sequential broaching performed up to a size 14. Trial neck and head were placed. The leg was brought back up to neutral and the construct reduced. The position and sizing of components, offset and leg lengths were checked using fluoroscopy. Stability of the  construct was checked in extension and external rotation without any subluxation or impingement of prosthesis. We dislocated the prosthesis, dropped the leg back into position, removed trial components, and irrigated copiously. The final stem and head was then placed, the leg brought back up, the system reduced and fluoroscopy used to verify positioning.  We irrigated, obtained hemostasis and closed the capsule using #2 ethibond suture.  Dilute betadyne solution was used. The fascia was closed with #1 vicryl plus, the deep fat layer was closed with 0 vicryl, the subcutaneous layers closed with 2.0 Vicryl Plus and the skin closed with 3.0 monocryl and steri strips. A sterile dressing was applied. The patient was awakened in the operating room and taken to recovery in stable condition.  All sponge, needle, and instrument counts were correct at the end of the case.   Position: supine  Complications: none.  Time Out: performed   Drains/Packing: none  Estimated blood loss: 100 cc  Returned to Recovery Room: in good condition.   Antibiotics: yes   Mechanical VTE (DVT) Prophylaxis: sequential compression devices, TED thigh-high  Chemical VTE (DVT) Prophylaxis: aspirin   Fluid Replacement: see anesthesia record  Specimens Removed: 1 to pathology   Sponge and Instrument Count Correct? yes   PACU: portable radiograph - low AP   Admission: inpatient status  Plan/RTC:  Return in 2 weeks for staple removal. Weight Bearing/Load Lower Extremity: full  Hip precautions: none Suture Removal: 10-14 days  Betadine to incision  twice daily once dressing is removed on POD#7  N. Eduard Roux, MD [place] [phone] 2:08 PM      Implant Name Type Inv. Item Serial No. Manufacturer Lot No. LRB No. Used  PIN SECTOR W/GRIP ACE CUP 52MM - [id number] Hips PIN SECTOR W/GRIP ACE CUP 52MM  DEPUY SYNTHES [id number] Right 1  LINER NEUTRAL 52X36MM PLUS 4 - [id number] Liner LINER NEUTRAL 52X36MM PLUS 4  DEPUY SYNTHES [id number] Right 1  STEM CORAIL KA14 - [id number] Stem STEM CORAIL KA14  DEPUY SYNTHES [id number] Right 1  HEAD CERAMIC DELTA 36 PLUS 1.5 - [id number] Hips HEAD CERAMIC DELTA 36 PLUS 1.5   DEPUY SYNTHES [id number] Right 1

## 2016-07-13 NOTE — Progress Notes (Signed)
Report given to ly rn as caregiver 

## 2016-07-13 NOTE — Transfer of Care (Signed)
Immediate Anesthesia Transfer of Care Note  Patient: Dana [name]  Procedure(s) Performed: Procedure(s): RIGHT TOTAL HIP ARTHROPLASTY ANTERIOR APPROACH (Right)  Patient Location: PACU  Anesthesia Type:MAC and Spinal  Level of Consciousness: awake, alert , oriented and patient cooperative  Airway & Oxygen Therapy: Patient Spontanous Breathing and Patient connected to nasal cannula oxygen  Post-op Assessment: Report given to RN and Post -op Vital signs reviewed and stable  Post vital signs: Reviewed and stable  Last Vitals:  Vitals:   07/13/16 1003  BP: (!) 152/91  Pulse: 79  Resp: 20  Temp: 36.8 C    Last Pain:  Vitals:   07/13/16 1005  TempSrc:   PainSc: 5       Patients Stated Pain Goal: 5 ([id number])  Complications: No apparent anesthesia complications

## 2016-07-13 NOTE — Discharge Instructions (Signed)

## 2016-07-13 NOTE — Progress Notes (Signed)
Patient has lymphedema in the left leg Dr Erlinda Hong requesting that ted hose not be placed before surgery.

## 2016-07-13 NOTE — Anesthesia Postprocedure Evaluation (Signed)
Anesthesia Post Note  Patient: Dana [name]  Procedure(s) Performed: Procedure(s) (LRB): RIGHT TOTAL HIP ARTHROPLASTY ANTERIOR APPROACH (Right)  Patient location during evaluation: PACU Anesthesia Type: Spinal Level of consciousness: oriented and awake and alert Pain management: pain level controlled Vital Signs Assessment: post-procedure vital signs reviewed and stable Respiratory status: spontaneous breathing, respiratory function stable and patient connected to nasal cannula oxygen Cardiovascular status: blood pressure returned to baseline and stable Postop Assessment: no headache and no backache Anesthetic complications: no       Last Vitals:  Vitals:   07/13/16 1558 07/13/16 1600  BP: 102/77   Pulse: 61 (!) 129  Resp: 15 16  Temp:      Last Pain:  Vitals:   07/13/16 1612  TempSrc:   PainSc: 3                  Kartel Wolbert S

## 2016-07-13 NOTE — H&P (Signed)
PREOPERATIVE H&P  Chief Complaint: right hip degenerative joint disease  HPI: Dana [name] is a 64 y.o. female who presents for surgical treatment of right hip degenerative joint disease.  She denies any changes in medical history.  Past Medical History:  Diagnosis Date  . Arthritis   . GERD (gastroesophageal reflux disease)   . History of blood transfusion   . Hypothyroidism   . Lymphedema    left leg- ted hose makes it worse  . Thyroid disease   . Uterine cancer [facility])    Endometrial cancer - surgery and chemo   Past Surgical History:  Procedure Laterality Date  . ABDOMINAL HYSTERECTOMY    . APPENDECTOMY    . COLONOSCOPY W/ POLYPECTOMY    . EYE SURGERY Bilateral    cataracts  . LIVER BIOPSY    . mass removed     2015 from pelvis   Social History   Social History  . Marital status: Single    Spouse name: N/A  . Number of children: N/A  . Years of education: N/A   Occupational History  . food Nature conservation officer    Social History Main Topics  . Smoking status: Never Smoker  . Smokeless tobacco: Never Used  . Alcohol use No  . Drug use: No  . Sexual activity: Not Asked   Other Topics Concern  . None   Social History Narrative  . None   Family History  Problem Relation Age of Onset  . Heart disease Mother   . Multiple myeloma Mother    Allergies  Allergen Reactions  . Chicken Allergy Diarrhea and Other (See Comments)    Claims must have "organic"  . Garlic Diarrhea and Other (See Comments)    Claims must have "organic"  . Other Diarrhea and Other (See Comments)    DAIRY PRODUCTS REDNESS Claims must have "organic"  . Penicillins Rash    Has patient had a PCN reaction causing immediate rash, facial/tongue/throat swelling, SOB or lightheadedness with hypotension: Yes Has patient had a PCN reaction causing severe rash involving mucus membranes or skin necrosis: No Has patient had a PCN reaction that required hospitalization No Has patient had a  PCN reaction occurring within the last 10 years: Yes If all of the above answers are "NO", then may proceed with Cephalosporin use.    Prior to Admission medications   Medication Sig Start Date End Date Taking? Authorizing Provider  bismuth subsalicylate (PEPTO BISMOL) 262 MG chewable tablet Chew 262 mg by mouth as needed for indigestion.   Yes Historical Provider, MD  Cholecalciferol (VITAMIN D) 2000 units CAPS Take 4,000 Units by mouth daily.   Yes Historical Provider, MD  levothyroxine (SYNTHROID, LEVOTHROID) 50 MCG tablet Take 50 mcg by mouth daily before breakfast.   Yes Historical Provider, MD  naproxen (NAPROSYN) 500 MG tablet Take 500 mg by mouth daily as needed for mild pain.   Yes Historical Provider, MD     Positive ROS: All other systems have been reviewed and were otherwise negative with the exception of those mentioned in the HPI and as above.  Physical Exam: General: Alert, no acute distress Cardiovascular: No pedal edema Respiratory: No cyanosis, no use of accessory musculature GI: abdomen soft Skin: No lesions in the area of chief complaint Neurologic: Sensation intact distally Psychiatric: Patient is competent for consent with normal mood and affect Lymphatic: no lymphedema  MUSCULOSKELETAL: exam stable  Assessment: right hip degenerative joint disease  Plan: Plan for Procedure(s):  RIGHT TOTAL HIP ARTHROPLASTY ANTERIOR APPROACH  The risks benefits and alternatives were discussed with the patient including but not limited to the risks of nonoperative treatment, versus surgical intervention including infection, bleeding, nerve injury,  blood clots, cardiopulmonary complications, morbidity, mortality, among others, and they were willing to proceed.   Eduard Roux, MD   07/13/2016 10:27 AM

## 2016-07-13 NOTE — Anesthesia Preprocedure Evaluation (Signed)
Anesthesia Evaluation  Patient identified by MRN, date of birth, ID band Patient awake    Reviewed: Allergy & Precautions, NPO status , Patient's Chart, lab work & pertinent test results  Airway Mallampati: II  TM Distance: >3 FB Neck ROM: Full    Dental no notable dental hx.    Pulmonary neg pulmonary ROS,    Pulmonary exam normal breath sounds clear to auscultation       Cardiovascular negative cardio ROS Normal cardiovascular exam Rhythm:Regular Rate:Normal     Neuro/Psych negative neurological ROS  negative psych ROS   GI/Hepatic negative GI ROS, Neg liver ROS,   Endo/Other  Hypothyroidism   Renal/GU negative Renal ROS  negative genitourinary   Musculoskeletal negative musculoskeletal ROS (+)   Abdominal   Peds negative pediatric ROS (+)  Hematology negative hematology ROS (+)   Anesthesia Other Findings   Reproductive/Obstetrics negative OB ROS                             Anesthesia Physical Anesthesia Plan  ASA: II  Anesthesia Plan: Spinal   Post-op Pain Management:    Induction: Intravenous  Airway Management Planned: Simple Face Mask  Additional Equipment:   Intra-op Plan:   Post-operative Plan:   Informed Consent: I have reviewed the patients History and Physical, chart, labs and discussed the procedure including the risks, benefits and alternatives for the proposed anesthesia with the patient or authorized representative who has indicated his/her understanding and acceptance.   Dental advisory given  Plan Discussed with: CRNA and Surgeon  Anesthesia Plan Comments:         Anesthesia Quick Evaluation

## 2016-07-13 NOTE — Anesthesia Procedure Notes (Signed)
Spinal  Patient location during procedure: OR Staffing Anesthesiologist: Nolon Nations Performed: anesthesiologist  Preanesthetic Checklist Completed: patient identified, site marked, surgical consent, pre-op evaluation, timeout performed, IV checked, risks and benefits discussed and monitors and equipment checked Spinal Block Patient position: sitting Prep: DuraPrep Patient monitoring: heart rate, continuous pulse ox and blood pressure Approach: right paramedian Location: L2-3 Injection technique: single-shot Needle Needle type: Sprotte  Needle gauge: 24 G Needle length: 9 cm Additional Notes Expiration date of kit checked and confirmed. Patient tolerated procedure well, without complications.

## 2016-07-14 ENCOUNTER — Encounter (HOSPITAL_COMMUNITY): Payer: Self-pay | Admitting: Orthopaedic Surgery

## 2016-07-14 LAB — BASIC METABOLIC PANEL
Anion gap: 8 (ref 5–15)
BUN: 15 mg/dL (ref 6–20)
CO2: 23 mmol/L (ref 22–32)
Calcium: 8 mg/dL — ABNORMAL LOW (ref 8.9–10.3)
Chloride: 106 mmol/L (ref 101–111)
Creatinine, Ser: 0.92 mg/dL (ref 0.44–1.00)
GFR calc Af Amer: >60 mL/min (ref >60)
GFR calc non Af Amer: >60 mL/min (ref >60)
Glucose, Bld: 113 mg/dL — ABNORMAL HIGH (ref 65–99)
Potassium: 3.7 mmol/L (ref 3.5–5.1)
Sodium: 137 mmol/L (ref 135–145)

## 2016-07-14 LAB — CBC
HCT: 34.5 % — ABNORMAL LOW (ref 36.0–46.0)
Hemoglobin: 11.5 g/dL — ABNORMAL LOW (ref 12.0–15.0)
MCH: 30.7 pg (ref 26.0–34.0)
MCHC: 33.3 g/dL (ref 30.0–36.0)
MCV: 92 fL (ref 78.0–100.0)
Platelets: 213 10*3/uL (ref 150–400)
RBC: 3.75 MIL/uL — ABNORMAL LOW (ref 3.87–5.11)
RDW: 13.8 % (ref 11.5–15.5)
WBC: 10 10*3/uL (ref 4.0–10.5)

## 2016-07-14 MED ORDER — NAPROXEN 250 MG PO TABS: 250.0000 mg | ORAL_TABLET | Freq: Two times a day (BID) | ORAL | Status: DC | PRN

## 2016-07-14 MED ORDER — HYDROMORPHONE HCL 2 MG PO TABS
1.0000 mg | ORAL_TABLET | ORAL | Status: DC | PRN
  Administered 2016-07-14 – 2016-07-15 (×3): 1 mg via ORAL
  Filled 2016-07-14 (×4): qty 1

## 2016-07-14 MED ORDER — HYDROMORPHONE HCL 2 MG PO TABS: 1.0000 mg | ORAL_TABLET | ORAL | 0 refills | Status: DC | PRN

## 2016-07-14 MED ORDER — NAPROXEN 250 MG PO TABS: 250.0000 mg | ORAL_TABLET | Freq: Two times a day (BID) | ORAL | 1 refills | Status: DC | PRN

## 2016-07-14 NOTE — Evaluation (Signed)
Occupational Therapy Evaluation Patient Details Name: Dana [name] MRN: [id number] DOB: [birth year] Today's Date: 07/14/2016    History of Present Illness 65 yo female admitted on 07/13/16 for elective right THA. PMH significant for OA, GERD, Hypothyroid, lymphadema LLE.    Clinical Impression   PTA, pt was independent with ADL and functional mobility. Pt currently requires mod assist for LB ADL and min assist for functional toilet transfers. Pt limited by pain this session and RN gave pain meds during session. Pt educated on compensatory ADL strategies and safety with ADL post-operatively. Pt will have intermittent assistance from friends at home. She would benefit from continued OT services while admitted to improve independence with ADL and functional mobility to maximize safety and return to PLOF. OT will continue to follow acutely with focus on shower transfers, use of AE for LB ADL, and fall prevention.    Follow Up Recommendations  No OT follow up;Supervision - Intermittent    Equipment Recommendations  3 in 1 bedside commode;Other (comment) (RW)    Recommendations for Other Services       Precautions / Restrictions Precautions Precautions: None Restrictions Weight Bearing Restrictions: Yes RLE Weight Bearing: Weight bearing as tolerated      Mobility Bed Mobility Overal bed mobility: Needs Assistance Bed Mobility: Supine to Sit     Supine to sit: Min assist;HOB elevated     General bed mobility comments: Received in recliner  Transfers Overall transfer level: Needs assistance Equipment used: Rolling walker (2 wheeled) Transfers: Sit to/from Stand Sit to Stand: Min assist         General transfer comment: Min A from EOB with assistance to stabilize at trunk, MIn a from commode with use of grab bars to stand    Balance Overall balance assessment: Needs assistance Sitting-balance support: Single extremity supported Sitting balance-Leahy Scale:  Fair Sitting balance - Comments: Relies on railings initially to sit at EOB with no back support.    Standing balance support: Bilateral upper extremity supported;During functional activity;No upper extremity supported Standing balance-Leahy Scale: Fair Standing balance comment: Able to stand without UE support for grooming at sink but requires B UE support for dynamic standing tasks.                            ADL Overall ADL's : Needs assistance/impaired     Grooming: Set up;Sitting   Upper Body Bathing: Set up;Sitting   Lower Body Bathing: Moderate assistance;Sit to/from stand   Upper Body Dressing : Set up;Sitting   Lower Body Dressing: Moderate assistance;Sit to/from stand   Toilet Transfer: Minimal assistance;Ambulation;RW   Toileting- Clothing Manipulation and Hygiene: Supervision/safety;Sitting/lateral lean       Functional mobility during ADLs: Minimal assistance;Rolling walker General ADL Comments: Educated pt on compensatory ADL strategies, safe toilet transfers, safe use of DME, and fall prevention strategies.      Vision Vision Assessment?: No apparent visual deficits   Perception     Praxis      Pertinent Vitals/Pain Pain Assessment: Faces Pain Score: 5  Faces Pain Scale: Hurts even more Pain Location: right hip Pain Descriptors / Indicators: Pressure Pain Intervention(s): Limited activity within patient's tolerance;Monitored during session;Repositioned;Ice applied;RN gave pain meds during session     Hand Dominance Right   Extremity/Trunk Assessment Upper Extremity Assessment Upper Extremity Assessment: Overall WFL for tasks assessed   Lower Extremity Assessment Lower Extremity Assessment: Defer to PT evaluation LLE Deficits / Details: normal post  op pain and weakenss. At least 3/5 grossly RLE   Cervical / Trunk Assessment Cervical / Trunk Assessment: Normal   Communication Communication Communication: No difficulties    Cognition Arousal/Alertness: Awake/alert Behavior During Therapy: WFL for tasks assessed/performed Overall Cognitive Status: Within Functional Limits for tasks assessed                     General Comments       Exercises Exercises: Total Joint     Shoulder Instructions      Home Living Family/patient expects to be discharged to:: Private residence Living Arrangements: Alone Available Help at Discharge: Friend(s) Type of Home: Other(Comment) (1 level townhome) Home Access: Stairs to enter [employer] of Steps: 2 Entrance Stairs-Rails: None Home Layout: One level     Bathroom Shower/Tub: Walk-in shower;Door   [employer] Toilet: Standard Bathroom Accessibility: Yes How Accessible: Accessible via walker Home Equipment: Cane - quad          Prior Functioning/Environment Level of Independence: Independent        Comments: pt was working for [employer], does a lot of walking and lifting. completely independent        OT Problem List: Decreased strength;Decreased range of motion;Decreased activity tolerance;Impaired balance (sitting and/or standing);Decreased safety awareness;Decreased knowledge of use of DME or AE;Decreased knowledge of precautions;Pain   OT Treatment/Interventions: Self-care/ADL training;Therapeutic exercise;DME and/or AE instruction;Therapeutic activities;Patient/family education;Balance training    OT Goals(Current goals can be found in the care plan section) Acute Rehab OT Goals Patient Stated Goal: to get home OT Goal Formulation: With patient Time For Goal Achievement: 07/21/16 Potential to Achieve Goals: Good ADL Goals Pt Will Perform Lower Body Bathing: sit to/from stand;with modified independence;with adaptive equipment Pt Will Perform Lower Body Dressing: with modified independence;with adaptive equipment;sit to/from stand Pt Will Transfer to Toilet: with modified independence;ambulating;bedside commode Pt Will Perform  Toileting - Clothing Manipulation and hygiene: with modified independence;sit to/from stand Pt Will Perform Tub/Shower Transfer: with modified independence;Shower transfer;rolling walker;3 in 1;ambulating  OT Frequency: Min 2X/week   Barriers to D/C:            Co-evaluation              End of Session Equipment Utilized During Treatment: Gait belt;Rolling walker Nurse Communication:  (Equipment/HH recommendations )  Activity Tolerance: Patient tolerated treatment well Patient left: in chair;with call bell/phone within reach   Time: 1027-1048 OT Time Calculation (min): 21 min Charges:  OT General Charges $OT Visit: 1 Procedure OT Evaluation $OT Eval Moderate Complexity: 1 Procedure G-CodesNorman Herrlich, OTR/L [id number] 07/14/2016, 11:47 AM

## 2016-07-14 NOTE — Progress Notes (Signed)
   Subjective:  Patient reports pain as moderate.  Nausea with oxycodone.  Objective:   VITALS:   Vitals:   07/13/16 1730 07/13/16 2018 07/14/16 0015 07/14/16 0440  BP:  114/69 (!) 102/58 108/62  Pulse: 61 67 69 65  Resp: 11 15 15 15   Temp:  98.1 F (36.7 C) 98.6 F (37 C) 98 F (36.7 C)  TempSrc:  Oral Oral Oral  SpO2: 100% 96% 94% 95%  Weight:        Neurologically intact Neurovascular intact Sensation intact distally Intact pulses distally Dorsiflexion/Plantar flexion intact Incision: dressing C/D/I and no drainage No cellulitis present Compartment soft   Lab Results  Component Value Date   WBC 10.0 07/14/2016   HGB 11.5 (L) 07/14/2016   HCT 34.5 (L) 07/14/2016   MCV 92.0 07/14/2016   PLT 213 07/14/2016     Assessment/Plan:  1 Day Post-Op   - Expected postop acute blood loss anemia - will monitor for symptoms - Up with PT/OT - DVT ppx - SCDs, ambulation, aspirin - WBAT operative extremity - Pain control - d/c'ed oxycodone, oxycontin.  Started dilaudid prn.  Naprosyn and toradol as needed. - Discharge planning - home sat likely  Eduard Roux 07/14/2016, 7:52 AM [phone]

## 2016-07-14 NOTE — Evaluation (Signed)
Physical Therapy Evaluation Patient Details Name: Dana [name] MRN: [id number] DOB: 1951/02/16 Today's Date: 07/14/2016   History of Present Illness  65 yo female admitted on 07/13/16 for elective right THA. PMH significant for OA, GERD, Hypothyroid, lymphadema LLE.   Clinical Impression  Pt is POD 1 following the above procedure. Prior to admission, pt was completely independent, living alone and working full time. Pt plans to return to her single level townhouse upon discharge with intermittent assistance from her friends. Pt is moving well with therapy with minimal assistance for bed mobs, transfers, and gait inside room and to bathroom. Pt will benefit from continuing to be seen acutely to address the below deficits in order to assist with a smooth transition home. Pt will benefit from HHPT upon discharge to maximize functional outcomes.     Follow Up Recommendations Home health PT    Equipment Recommendations  Rolling walker with 5" wheels;3in1 (PT)    Recommendations for Other Services       Precautions / Restrictions Precautions Precautions: None Restrictions Weight Bearing Restrictions: Yes RLE Weight Bearing: Weight bearing as tolerated      Mobility  Bed Mobility Overal bed mobility: Needs Assistance Bed Mobility: Supine to Sit     Supine to sit: Min assist;HOB elevated     General bed mobility comments: Min A to advance RLE EOB.   Transfers Overall transfer level: Needs assistance   Transfers: Sit to/from Stand Sit to Stand: Min assist;From elevated surface         General transfer comment: Min A from EOB with assistance to stabilize at trunk, MIn a from commode with use of grab bars to stand  Ambulation/Gait Ambulation/Gait assistance: Min assist Ambulation Distance (Feet): 30 Feet Assistive device: Rolling walker (2 wheeled) Gait Pattern/deviations: Step-to pattern;Decreased step length - left;Decreased stance time - right;Antalgic;Decreased weight  shift to right Gait velocity: decreased Gait velocity interpretation: Below normal speed for age/gender General Gait Details: Moderate antalgic gait, cues for proper sequencing and heel strike RLE.   Stairs            Wheelchair Mobility    Modified Rankin (Stroke Patients Only)       Balance Overall balance assessment: Needs assistance Sitting-balance support: Single extremity supported Sitting balance-Leahy Scale: Fair Sitting balance - Comments: Relies on railings initially to sit at EOB with no back support.    Standing balance support: Bilateral upper extremity supported;During functional activity Standing balance-Leahy Scale: Poor Standing balance comment: Relies on RW for stability in standing                             Pertinent Vitals/Pain Pain Assessment: 0-10 Pain Score: 5  Pain Location: right hip Pain Descriptors / Indicators: Pressure Pain Intervention(s): Monitored during session;Premedicated before session;Repositioned;Ice applied    Home Living Family/patient expects to be discharged to:: Private residence Living Arrangements: Alone Available Help at Discharge: Friend(s) Type of Home: Other(Comment) (1 level townhouse) Home Access: Stairs to enter Entrance Stairs-Rails: None Entrance Stairs-Number of Steps: 2 Home Layout: One level Home Equipment: Cane - quad      Prior Function Level of Independence: Independent         Comments: pt was working for [employer], does a lot of walking and lifting. completely independent     Hand Dominance   Dominant Hand: Right    Extremity/Trunk Assessment   Upper Extremity Assessment Upper Extremity Assessment: Defer to OT evaluation  Lower Extremity Assessment Lower Extremity Assessment: LLE deficits/detail LLE Deficits / Details: normal post op pain and weakenss. At least 3/5 grossly RLE    Cervical / Trunk Assessment Cervical / Trunk Assessment: Normal  Communication    Communication: No difficulties  Cognition Arousal/Alertness: Awake/alert Behavior During Therapy: WFL for tasks assessed/performed Overall Cognitive Status: Within Functional Limits for tasks assessed                      General Comments      Exercises Total Joint Exercises Ankle Circles/Pumps: AROM;Both;20 reps;Supine Quad Sets: AROM;Right;10 reps;Supine Heel Slides: AAROM;10 reps;Right;Supine   Assessment/Plan    PT Assessment Patient needs continued PT services  PT Problem List Decreased strength;Decreased range of motion;Decreased activity tolerance;Decreased balance;Decreased mobility;Decreased knowledge of use of DME;Pain          PT Treatment Interventions DME instruction;Gait training;Stair training;Functional mobility training;Therapeutic activities;Therapeutic exercise;Balance training;Patient/family education    PT Goals (Current goals can be found in the Care Plan section)  Acute Rehab PT Goals Patient Stated Goal: to get home PT Goal Formulation: With patient Time For Goal Achievement: 07/21/16 Potential to Achieve Goals: Good    Frequency 7X/week   Barriers to discharge        Co-evaluation               End of Session Equipment Utilized During Treatment: Gait belt Activity Tolerance: Patient tolerated treatment well Patient left: in chair;with call bell/phone within reach Nurse Communication: Mobility status         Time: [id number] PT Time Calculation (min) (ACUTE ONLY): 40 min   Charges:   PT Evaluation $PT Eval Moderate Complexity: 1 Procedure PT Treatments $Gait Training: 8-22 mins $Therapeutic Exercise: 8-22 mins   PT G Codes:        Scheryl Marten PT, DPT  [phone]  07/14/2016, 8:55 AM

## 2016-07-14 NOTE — Progress Notes (Signed)
Physical Therapy Treatment Patient Details Name: Dana [name] MRN: [id number] DOB: 23-Apr-1951 Today's Date: 07/14/2016    History of Present Illness 65 yo female admitted on 07/13/16 for elective right THA. PMH significant for OA, GERD, Hypothyroid, lymphadema LLE.     PT Comments    Pt presents with improved mobility and decreased discomfort this session. Increased gait distance with improved sequencing and initiating step through patterning. Pt's friend who will assist when she goes home is present throughout session. Reviewed exercises in HEP.    Follow Up Recommendations  Home health PT     Equipment Recommendations  Rolling walker with 5" wheels;3in1 (PT)    Recommendations for Other Services       Precautions / Restrictions Precautions Precautions: None Restrictions Weight Bearing Restrictions: Yes RLE Weight Bearing: Weight bearing as tolerated    Mobility  Bed Mobility               General bed mobility comments: Received in recliner  Transfers Overall transfer level: Needs assistance Equipment used: Rolling walker (2 wheeled) Transfers: Sit to/from Stand Sit to Stand: Min guard         General transfer comment: Min guard from recliner and commode  Ambulation/Gait Ambulation/Gait assistance: Min guard Ambulation Distance (Feet): 100 Feet Assistive device: Rolling walker (2 wheeled) Gait Pattern/deviations: Step-to pattern;Decreased step length - left;Decreased stance time - right;Antalgic;Decreased weight shift to right Gait velocity: decreased Gait velocity interpretation: Below normal speed for age/gender General Gait Details: Mild antalgic gait this session, improved sequencing and distance   Stairs            Wheelchair Mobility    Modified Rankin (Stroke Patients Only)       Balance Overall balance assessment: Needs assistance Sitting-balance support: Single extremity supported Sitting balance-Leahy Scale: Fair Sitting  balance - Comments: Relies on railings initially to sit at EOB with no back support.    Standing balance support: Bilateral upper extremity supported;During functional activity;No upper extremity supported Standing balance-Leahy Scale: Fair Standing balance comment: Able to stand without UE support for grooming at sink but requires B UE support for dynamic standing tasks.                    Cognition Arousal/Alertness: Awake/alert Behavior During Therapy: WFL for tasks assessed/performed Overall Cognitive Status: Within Functional Limits for tasks assessed                      Exercises Total Joint Exercises Short Arc Quad: AROM;Right;10 reps;Supine Heel Slides: AAROM;10 reps;Right;Supine Hip ABduction/ADduction: AROM;Right;10 reps;Supine    General Comments        Pertinent Vitals/Pain Pain Assessment: 0-10 Pain Score: 3  Faces Pain Scale: Hurts even more Pain Location: right hip Pain Descriptors / Indicators: Pressure Pain Intervention(s): Monitored during session;Premedicated before session;Ice applied    Home Living Family/patient expects to be discharged to:: Private residence Living Arrangements: Alone Available Help at Discharge: Friend(s) Type of Home: Other(Comment) (1 level townhome) Home Access: Stairs to enter Entrance Stairs-Rails: None Home Layout: One level Home Equipment: Cane - quad      Prior Function Level of Independence: Independent      Comments: pt was working for [employer], does a lot of walking and lifting. completely independent   PT Goals (current goals can now be found in the care plan section) Acute Rehab PT Goals Patient Stated Goal: to get home Progress towards PT goals: Progressing toward goals    Frequency  7X/week      PT Plan Current plan remains appropriate    Co-evaluation             End of Session Equipment Utilized During Treatment: Gait belt Activity Tolerance: Patient tolerated treatment  well Patient left: in chair;with call bell/phone within reach;with family/visitor present     Time: [id number] PT Time Calculation (min) (ACUTE ONLY): 26 min  Charges:  $Gait Training: 8-22 mins $Therapeutic Exercise: 8-22 mins                    G Codes:      Scheryl Marten PT, DPT  [phone]  07/14/2016, 1:52 PM

## 2016-07-14 NOTE — Care Management Note (Signed)
Case Management Note  Patient Details  Name: Dana [name] MRN: [id number] Date of Birth: May 16, 1951  Subjective/Objective:  65 yr old female s/p right total hip arthroplasty.                  Action/Plan: Case manager spoke with patient concerning Home health and DME needs. Patient was preoperatively setup with [facility], no changes. Case manager has ordered RW and 3in1 from [facility]. Patient states she has friends that will assist her at discharge.    Expected Discharge Date:   07/15/16               Expected Discharge Plan:  [place]  In-House Referral:  NA  Discharge planning Services  CM Consult  Post Acute Care Choice:  Durable Medical Equipment, Home Health Choice offered to:  Patient  DME Arranged:  3-N-1, [facility] youth DME Agency:  [place]:  PT [place] Agency:  [facility] (formerly [employer])  Status of Service:  Completed, signed off  If discussed at [employer], dates discussed:    Additional Comments:  Ninfa Meeker, RN 07/14/2016, 11:50 AM

## 2016-07-15 NOTE — Progress Notes (Signed)
Discharge instructions gone over with patient. Home medications gone over. Prescriptions given. Follow up appointment is made. Diet, activity, and reasons to call the doctor discussed. Incisional care and bowel regimen discussed. Hip precautions, signs and symptoms of infection, and reasons to call 911 also discussed. Patient verbalized understanding of instructions.

## 2016-07-15 NOTE — Progress Notes (Signed)
Occupational Therapy Treatment Patient Details Name: Dana [name] MRN: [id number] DOB: 08/10/1950 Today's Date: 07/15/2016    History of present illness 65 yo female admitted on 07/13/16 for elective right THA. PMH significant for OA, GERD, Hypothyroid, lymphadema LLE.    OT comments  Focus of today's session was introduction and use of A/E for LB ADLs.  Pt. Able to return demo of use and interested in purchase for home.  Reports she will sponge bathe initially and defer shower transfer to [facility] therapies.  Note d/c later today.    Follow Up Recommendations       Equipment Recommendations       Recommendations for Other Services      Precautions / Restrictions Precautions Precautions: None Restrictions Weight Bearing Restrictions: Yes RLE Weight Bearing: Weight bearing as tolerated       Mobility Bed Mobility               General bed mobility comments: seated in recliner upon entry into room, and end of session.  reports using bed sheet to guide RLE in/out of bed  Transfers Overall transfer level: Needs assistance Equipment used: Rolling walker (2 wheeled) Transfers: Sit to/from Stand Sit to Stand: Supervision         General transfer comment: Supervision for safety    Balance                                   ADL Overall ADL's : Needs assistance/impaired             Lower Body Bathing: Set up;Sitting/lateral leans;Cueing for sequencing;With adaptive equipment Lower Body Bathing Details (indicate cue type and reason): pt. interested in purchase of A/E for home use     Lower Body Dressing: Min guard;With adaptive equipment;Cueing for sequencing;Sitting/lateral leans;Sit to/from stand Lower Body Dressing Details (indicate cue type and reason): pt. interested in purchase of A/E for home use   Toilet Transfer Details (indicate cue type and reason): declined need for review, states she has been amb. to/from b.room without assist        Tub/Shower Transfer Details (indicate cue type and reason): declined need for review.  states she will sponge bathe initially and agreed not to attempt shower transfer at home until reviewed with [facility] therapy   General ADL Comments: provided demo of use for A/E, pt. interested in purchase for home.  reports she will have friends "in and out" but would not want them assisting with ADLS.        Vision                     Perception     Praxis      Cognition   Behavior During Therapy: [facility] for tasks assessed/performed Overall Cognitive Status: Within Functional Limits for tasks assessed                       Extremity/Trunk Assessment               Exercises Total Joint Exercises Ankle Circles/Pumps: AROM;Both;20 reps;Supine Quad Sets: AROM;Right;10 reps;Supine Heel Slides: AAROM;10 reps;Right;Supine Hip ABduction/ADduction: AROM;Right;10 reps;Supine Long Arc Quad: AROM;Right;10 reps;Seated   Shoulder Instructions       General Comments      Pertinent Vitals/ Pain       Pain Assessment: No/denies pain Pain Score: 4  Pain Location: right hip Pain Descriptors /  Indicators: Sore Pain Intervention(s): Monitored during session;Premedicated before session;Ice applied  Home Living                                          Prior Functioning/Environment              Frequency           Progress Toward Goals  OT Goals(current goals can now be found in the care plan section)  Progress towards OT goals: Progressing toward goals  Acute Rehab OT Goals Patient Stated Goal: to get home  Plan      Co-evaluation                 End of Session Equipment Utilized During Treatment: Other (comment) (A/E)   Activity Tolerance     Patient Left     Nurse Communication          Time: [id number] OT Time Calculation (min): 13 min  Charges: OT General Charges $OT Visit: 1 Procedure OT Treatments $Self Care/Home  Management : 8-22 mins  Janice Coffin, COTA/L 07/15/2016, 10:30 AM

## 2016-07-15 NOTE — Care Management Note (Addendum)
Case Management Note  Patient Details  Name: Dana [name] MRN: [id number] Date of Birth: Oct 23, 1950  Subjective/Objective:                  65 yr old female s/p right total hip arthroplasty.                 Action/Plan:  RNCM spoke with patient, states she is ready for discharge, no additional CM needs at this time.   Has received DME and a call from [facility] agency to verify start of care.    Telephone call to [name] at [facility] [phone]) advised patient discharged.  Expected Discharge Date: 07/15/16             Expected Discharge Plan:  [place]  In-House Referral:  NA  Discharge planning Services  CM Consult  Post Acute Care Choice:  Durable Medical Equipment, Home Health Choice offered to:  Patient  DME Arranged:  3-N-1, [facility] youth DME Agency:  [place]:  PT [place] Agency:  [facility] (formerly [employer])  Status of Service:  Completed, signed off  If discussed at [employer], dates discussed:    Additional Comments:  Serena Colonel, RN 07/15/2016, 11:36 AM

## 2016-07-15 NOTE — Progress Notes (Signed)
Physical Therapy Treatment Patient Details Name: Dana [name] MRN: [id number] DOB: 1951-03-01 Today's Date: 07/15/2016    History of Present Illness 64 yo female admitted on 07/13/16 for elective right THA. PMH significant for OA, GERD, Hypothyroid, lymphadema LLE.     PT Comments    Pt presents POD 2 and continues to be moving well with therapy. Performed seated exercises and gait training with c/o stiffness which improved with mobility. Instructed pt on continuing to get up at least 1x an hour at home in order to maintain mobility and reduce stiffness. Performed 1 step to simulate getting into home and pt was able to perform with little difficulty this session. Pt making good progress toward goals.   Follow Up Recommendations  Home health PT     Equipment Recommendations  Rolling walker with 5" wheels;3in1 (PT)    Recommendations for Other Services       Precautions / Restrictions Precautions Precautions: None Restrictions Weight Bearing Restrictions: Yes RLE Weight Bearing: Weight bearing as tolerated    Mobility  Bed Mobility               General bed mobility comments: Received in recliner  Transfers Overall transfer level: Needs assistance Equipment used: Rolling walker (2 wheeled) Transfers: Sit to/from Stand Sit to Stand: Supervision         General transfer comment: Supervision for safety  Ambulation/Gait Ambulation/Gait assistance: Supervision Ambulation Distance (Feet): 150 Feet Assistive device: Rolling walker (2 wheeled) Gait Pattern/deviations: Step-to pattern;Decreased step length - left;Decreased stance time - right;Antalgic;Decreased weight shift to right Gait velocity: decreased Gait velocity interpretation: Below normal speed for age/gender General Gait Details: Mild antalgic gait this session, improved sequencing and distance   Stairs Stairs: Yes   Stair Management: No rails;With walker;Forwards Number of Stairs: 1 General stair  comments: Up and down 1 step to simulate getting into home. Pt requires min cues for sequencing, but is able to perfrom with little difficulty this session.   Wheelchair Mobility    Modified Rankin (Stroke Patients Only)       Balance                                    Cognition Arousal/Alertness: Awake/alert Behavior During Therapy: WFL for tasks assessed/performed Overall Cognitive Status: Within Functional Limits for tasks assessed                      Exercises Total Joint Exercises Ankle Circles/Pumps: AROM;Both;20 reps;Supine Quad Sets: AROM;Right;10 reps;Supine Heel Slides: AAROM;10 reps;Right;Supine Hip ABduction/ADduction: AROM;Right;10 reps;Supine Long Arc Quad: AROM;Right;10 reps;Seated    General Comments        Pertinent Vitals/Pain Pain Assessment: 0-10 Pain Score: 4  Pain Location: right hip Pain Descriptors / Indicators: Sore Pain Intervention(s): Monitored during session;Premedicated before session;Ice applied    Home Living                      Prior Function            PT Goals (current goals can now be found in the care plan section) Acute Rehab PT Goals Patient Stated Goal: to get home Progress towards PT goals: Progressing toward goals    Frequency    7X/week      PT Plan Current plan remains appropriate    Co-evaluation  End of Session Equipment Utilized During Treatment: Gait belt Activity Tolerance: Patient tolerated treatment well Patient left: in chair;with call bell/phone within reach     Time: 0935-1002 PT Time Calculation (min) (ACUTE ONLY): 27 min  Charges:  $Gait Training: 23-37 mins                    G Codes:      Scheryl Marten PT, DPT  [phone]  07/15/2016, 10:27 AM

## 2016-07-15 NOTE — Discharge Summary (Signed)
Patient ID: Dana [name] MRN: [id number] DOB/AGE: June 03, 1951 65 y.o.  Admit date: 07/13/2016 Discharge date: 07/15/2016  Admission Diagnoses:  Active Problems:   Hip joint replacement status   Discharge Diagnoses:  Status post right total hip arthroplasty Post op anemia asymptomatic   Past Medical History:  Diagnosis Date  . Arthritis   . GERD (gastroesophageal reflux disease)   . History of blood transfusion   . Hypothyroidism   . Lymphedema    left leg- ted hose makes it worse  . Thyroid disease   . Uterine cancer [facility])    Endometrial cancer - surgery and chemo    Surgeries: Procedure(s): RIGHT TOTAL HIP ARTHROPLASTY ANTERIOR APPROACH on 07/13/2016   Consultants:   Discharged Condition: Improved  Hospital Course: Dana [name] is an 65 y.o. female who was admitted 07/13/2016 for operative treatment of<principal problem not specified>. Patient has severe unremitting pain that affects sleep, daily activities, and work/hobbies. After pre-op clearance the patient was taken to the operating room on 07/13/2016 and underwent  Procedure(s): RIGHT TOTAL HIP ARTHROPLASTY ANTERIOR APPROACH.    Patient was given perioperative antibiotics: Anti-infectives    Start     Dose/Rate Route Frequency Ordered Stop   07/13/16 1815  vancomycin (VANCOCIN) IVPB 1000 mg/200 mL premix     1,000 mg 200 mL/hr over 60 Minutes Intravenous Every 12 hours 07/13/16 1804 07/14/16 0655   07/13/16 1130  ceFAZolin (ANCEF) IVPB 2g/100 mL premix     2 g 200 mL/hr over 30 Minutes Intravenous To Surgery 07/13/16 0946 07/13/16 1234       Patient was given sequential compression devices, early ambulation, and chemoprophylaxis to prevent DVT.  Patient benefited maximally from hospital stay and there were no complications.    Recent vital signs: Patient Vitals for the past 24 hrs:  BP Temp Temp src Pulse Resp SpO2  07/15/16 0510 125/65 98.3 F (36.8 C) Oral 72 16 97 %  07/14/16 2021 138/76 98 F  (36.7 C) Oral 89 16 96 %  07/14/16 1348 123/70 98.6 F (37 C) Oral 85 16 96 %     Recent laboratory studies:  Recent Labs  07/14/16 0530  WBC 10.0  HGB 11.5*  HCT 34.5*  PLT 213  NA 137  K 3.7  CL 106  CO2 23  BUN 15  CREATININE 0.92  GLUCOSE 113*  CALCIUM 8.0*     Discharge Medications:   Allergies as of 07/15/2016      Reactions   Chicken Allergy Diarrhea, Other (See Comments)   Claims must have "organic"   Garlic Diarrhea, Other (See Comments)   Claims must have "organic"   Other Diarrhea, Other (See Comments)   DAIRY PRODUCTS REDNESS Claims must have "organic"   Penicillins Rash   Has patient had a PCN reaction causing immediate rash, facial/tongue/throat swelling, SOB or lightheadedness with hypotension: Yes Has patient had a PCN reaction causing severe rash involving mucus membranes or skin necrosis: No Has patient had a PCN reaction that required hospitalization No Has patient had a PCN reaction occurring within the last 10 years: Yes If all of the above answers are "NO", then may proceed with Cephalosporin use.      Medication List    TAKE these medications   aspirin EC 325 MG tablet Take 1 tablet (325 mg total) by mouth 2 (two) times daily.   bismuth subsalicylate 99991111 MG chewable tablet Commonly known as:  PEPTO BISMOL Chew 262 mg by mouth as needed for indigestion.  HYDROmorphone 2 MG tablet Commonly known as:  DILAUDID Take 0.5 tablets (1 mg total) by mouth every 4 (four) hours as needed for severe pain.   levothyroxine 50 MCG tablet Commonly known as:  SYNTHROID, LEVOTHROID Take 50 mcg by mouth daily before breakfast.   methocarbamol 750 MG tablet Commonly known as:  ROBAXIN Take 1 tablet (750 mg total) by mouth 2 (two) times daily as needed for muscle spasms.   naproxen 250 MG tablet Commonly known as:  NAPROSYN Take 1-2 tablets (250-500 mg total) by mouth 2 (two) times daily as needed (pain). What changed:  medication  strength  how much to take  when to take this  reasons to take this   ondansetron 4 MG tablet Commonly known as:  ZOFRAN Take 1-2 tablets (4-8 mg total) by mouth every 8 (eight) hours as needed for nausea or vomiting.   oxyCODONE 5 MG immediate release tablet Commonly known as:  Oxy IR/ROXICODONE Take 1-3 tablets (5-15 mg total) by mouth every 4 (four) hours as needed.   oxyCODONE 10 mg 12 hr tablet Commonly known as:  OXYCONTIN Take 1 tablet (10 mg total) by mouth every 12 (twelve) hours.   promethazine 25 MG tablet Commonly known as:  PHENERGAN Take 1 tablet (25 mg total) by mouth every 6 (six) hours as needed for nausea.   senna-docusate 8.6-50 MG tablet Commonly known as:  SENOKOT S Take 1 tablet by mouth at bedtime as needed.   Vitamin D 2000 units Caps Take 4,000 Units by mouth daily.            Durable Medical Equipment        Start     Ordered   07/13/16 1805  DME Walker rolling  Once    Question:  Patient needs a walker to treat with the following condition  Answer:  History of hip replacement   07/13/16 1804   07/13/16 1805  DME 3 n 1  Once     07/13/16 1804   07/13/16 1805  DME Bedside commode  Once    Question:  Patient needs a bedside commode to treat with the following condition  Answer:  History of hip replacement   07/13/16 1804      Diagnostic Studies: Dg Pelvis Portable  Result Date: 07/13/2016 CLINICAL DATA:  Status post right total hip arthroplasty EXAM: PORTABLE PELVIS 1-2 VIEWS COMPARISON:  Intraoperative fluoroscopic radiographs from right hip arthroplasty from earlier today. FINDINGS: Status post right total hip arthroplasty with well-positioned right acetabular and right proximal femoral prostheses, with no evidence of hardware fracture or loosening. No evidence of hip dislocation on the single frontal views. No osseous fracture. No suspicious focal osseous lesion. Spondylosis in the visualized lower lumbar spine. Expected postsurgical  soft tissue gas surrounding the right hip joint. IMPRESSION: Satisfactory postoperative appearance status post right hip arthroplasty. Electronically Signed   By: Ilona Sorrel M.D.   On: 07/13/2016 15:30   Dg C-arm 1-60 Min  Result Date: 07/13/2016 CLINICAL DATA:  Right total hip arthroplasty. EXAM: OPERATIVE  HIP (WITH PELVIS IF PERFORMED)  VIEWS TECHNIQUE: Fluoroscopic spot image(s) were submitted for interpretation post-operatively. COMPARISON:  None. FINDINGS: Three images are submitted. At right total hip arthroplasty was performed. No immediate complications are evident. IMPRESSION: Right total hip arthroplasty without radiographic evidence for complication on a single AP intraoperative spot image. Electronically Signed   By: San Morelle M.D.   On: 07/13/2016 14:40   Dg Hip Operative Unilat With Pelvis Left  Result Date: 07/13/2016 CLINICAL DATA:  Right total hip arthroplasty. EXAM: OPERATIVE  HIP (WITH PELVIS IF PERFORMED)  VIEWS TECHNIQUE: Fluoroscopic spot image(s) were submitted for interpretation post-operatively. COMPARISON:  None. FINDINGS: Three images are submitted. At right total hip arthroplasty was performed. No immediate complications are evident. IMPRESSION: Right total hip arthroplasty without radiographic evidence for complication on a single AP intraoperative spot image. Electronically Signed   By: San Morelle M.D.   On: 07/13/2016 14:40    Disposition: Final discharge disposition not confirmed    Follow-up Information    Eduard Roux, MD Follow up in 2 week(s).   Specialty:  Orthopedic Surgery Why:  For suture removal, For wound re-check Contact information: [place] [address] [phone]        [facility] Follow up.   Specialty:  [place] Why:  Someone from [facility] will contact you to arrange start date and time for therapy. Contact information: [address] [place]  [id number] [phone]            Signed: Erskine Emery 07/15/2016, 10:12 AM

## 2016-07-15 NOTE — Progress Notes (Signed)
Subjective: 2 Days Post-Op Procedure(s) (LRB): RIGHT TOTAL HIP ARTHROPLASTY ANTERIOR APPROACH (Right) Patient reports pain as mild.  No complaints wanting to be discharged home today.   Objective: Vital signs in last 24 hours: Temp:  [98 F (36.7 C)-98.6 F (37 C)] 98.3 F (36.8 C) (12/30 0510) Pulse Rate:  [72-89] 72 (12/30 0510) Resp:  [16] 16 (12/30 0510) BP: (123-138)/(65-76) 125/65 (12/30 0510) SpO2:  [96 %-97 %] 97 % (12/30 0510)  Intake/Output from previous day: 12/29 0701 - 12/30 0700 In: 2895 [P.O.:720; I.V.:2175] Out: -  Intake/Output this shift: Total I/O In: 120 [P.O.:120] Out: -    Recent Labs  07/14/16 0530  HGB 11.5*    Recent Labs  07/14/16 0530  WBC 10.0  RBC 3.75*  HCT 34.5*  PLT 213    Recent Labs  07/14/16 0530  NA 137  K 3.7  CL 106  CO2 23  BUN 15  CREATININE 0.92  GLUCOSE 113*  CALCIUM 8.0*   No results for input(s): LABPT, INR in the last 72 hours.  Right lower extremity: Sensation intact distally Intact pulses distally Dorsiflexion/Plantar flexion intact Incision: scant drainage Compartment soft  Assessment/Plan: 2 Days Post-Op Procedure(s) (LRB): RIGHT TOTAL HIP ARTHROPLASTY ANTERIOR APPROACH (Right) Up with therapy Discharge home with home health today.   GILBERT CLARK 07/15/2016, 10:09 AM

## 2016-07-18 NOTE — Telephone Encounter (Signed)
Please advise on message below.

## 2016-07-18 NOTE — Telephone Encounter (Signed)
yes

## 2016-07-18 NOTE — Telephone Encounter (Signed)
[name] Sunday 07/16/16, 2x a week for 3 weeks Need a verbal to proceed [phone]

## 2016-07-19 NOTE — Telephone Encounter (Signed)
[facility] Dr Erlinda Hong approved orders.

## 2016-07-20 ENCOUNTER — Inpatient Hospital Stay (INDEPENDENT_AMBULATORY_CARE_PROVIDER_SITE_OTHER): Payer: BLUE CROSS/BLUE SHIELD | Admitting: Orthopaedic Surgery

## 2016-07-27 ENCOUNTER — Ambulatory Visit (INDEPENDENT_AMBULATORY_CARE_PROVIDER_SITE_OTHER): Payer: BLUE CROSS/BLUE SHIELD | Admitting: Orthopaedic Surgery

## 2016-07-27 DIAGNOSIS — M1611 Unilateral primary osteoarthritis, right hip: Secondary | ICD-10-CM

## 2016-07-27 NOTE — Progress Notes (Signed)
Patient is 2 weeks status post right total hip replacement. She is doing well and almost with home health physical therapy. She does have some numbness around the incision and some pain but not taking narcotic pain medicines. The incisions well-healed no signs of infection. The surgical dressing was removed permanently today. Continue aspirin for DVT prophylaxis. Patient is doing very well and in my opinion does not need outpatient physical therapy. 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 Follow-up in 4 weeks with standing AP pelvis x-ray.

## 2016-08-24 ENCOUNTER — Ambulatory Visit (INDEPENDENT_AMBULATORY_CARE_PROVIDER_SITE_OTHER): Payer: BLUE CROSS/BLUE SHIELD

## 2016-08-24 ENCOUNTER — Encounter (INDEPENDENT_AMBULATORY_CARE_PROVIDER_SITE_OTHER): Payer: Self-pay | Admitting: Orthopaedic Surgery

## 2016-08-24 ENCOUNTER — Ambulatory Visit (INDEPENDENT_AMBULATORY_CARE_PROVIDER_SITE_OTHER): Payer: BLUE CROSS/BLUE SHIELD | Admitting: Orthopaedic Surgery

## 2016-08-24 DIAGNOSIS — M1611 Unilateral primary osteoarthritis, right hip: Secondary | ICD-10-CM | POA: Diagnosis not present

## 2016-08-24 NOTE — Progress Notes (Signed)
6 week THA follow up plan  Patient presents for follow up 6 weeks status post total hip replacement. The wound is healed and there is no evidence of infection. TED hose may be discontinued. Radiographs reveal a total hip arthroplasty in good position, with no evidence of subsidence, loosening, or complicating features. It was reinforced that with any procedure including dental work, colonoscopy, or any invasive procedure that pre-procedural prophylactic antibiotics must be taken to decrease the risk of infection. Reminders were given about signs to be aware of including redness, drainage, increased pain, fevers, calf pain, shortness of breath, or any concern should generate a phone call or a return to see [place] immediately. Will plan to follow up at 3 months postoperatively for recheck.  She needs to be out of work for another 6 weeks.  Continue with PT and HEP.  Increase activity as tolerated.

## 2016-08-25 ENCOUNTER — Telehealth: Payer: Self-pay | Admitting: Orthopaedic Surgery

## 2016-08-25 NOTE — Telephone Encounter (Signed)
-----   Message from Milbert Coulter sent at 08/25/2016  8:23 AM EST ----- Regarding: Out of Work Letter Contact: [phone] Good morning,  This patient called a moment ago. She said her work letter was insufficient for extending her benefits and they need a medical reason on the letter as to why she needs to be out the next 6 weeks. She needs an updated letter sent to fax#: [phone].

## 2016-08-25 NOTE — Telephone Encounter (Signed)
Faxed note to [phone].

## 2016-08-25 NOTE — Telephone Encounter (Signed)
Good morning,   This patient called a moment ago. She said her work letter was insufficient for extending her benefits and they need a medical reason on the letter as to why she needs to be out the next 6 weeks. She needs an updated letter sent to fax#: [phone].

## 2016-08-25 NOTE — Telephone Encounter (Signed)
State that she has not progressed with PT and strengthening and independence with her ADLs as expected so therefore she needs an additional 6 weeks.

## 2016-08-25 NOTE — Telephone Encounter (Signed)
Please advise. Thanks.  

## 2016-08-29 ENCOUNTER — Telehealth (INDEPENDENT_AMBULATORY_CARE_PROVIDER_SITE_OTHER): Payer: Self-pay | Admitting: Orthopaedic Surgery

## 2016-08-29 NOTE — Telephone Encounter (Signed)
Patient is requesting a copy of the letter that was faxed to [facility] regarding her extension of benefits for being out of work.  Please call her when ready to pick up.  [phone].

## 2016-08-29 NOTE — Telephone Encounter (Signed)
Letter ready for pick up at the front desk. Called pt.

## 2016-09-20 ENCOUNTER — Telehealth (INDEPENDENT_AMBULATORY_CARE_PROVIDER_SITE_OTHER): Payer: Self-pay | Admitting: *Deleted

## 2016-09-20 NOTE — Telephone Encounter (Signed)
Patient called in this morning in regards to needing a note for work stating that she is put out of work until March 22nd when she comes back in to see Dr. Erlinda Hong. Her CB #[phone]. Thank you

## 2016-09-20 NOTE — Telephone Encounter (Signed)
yes

## 2016-09-20 NOTE — Telephone Encounter (Signed)
Please advise thanks.

## 2016-09-21 NOTE — Telephone Encounter (Signed)
Called pt to let her know Work not is ready for pick up at the front desk. No answer LMOM

## 2016-10-02 ENCOUNTER — Ambulatory Visit (INDEPENDENT_AMBULATORY_CARE_PROVIDER_SITE_OTHER): Payer: BLUE CROSS/BLUE SHIELD | Admitting: Orthopaedic Surgery

## 2016-10-02 ENCOUNTER — Encounter (INDEPENDENT_AMBULATORY_CARE_PROVIDER_SITE_OTHER): Payer: Self-pay | Admitting: Orthopaedic Surgery

## 2016-10-02 ENCOUNTER — Ambulatory Visit (INDEPENDENT_AMBULATORY_CARE_PROVIDER_SITE_OTHER): Payer: Self-pay

## 2016-10-02 DIAGNOSIS — M79672 Pain in left foot: Secondary | ICD-10-CM | POA: Diagnosis not present

## 2016-10-02 DIAGNOSIS — M1611 Unilateral primary osteoarthritis, right hip: Secondary | ICD-10-CM

## 2016-10-02 MED ORDER — CLINDAMYCIN HCL 300 MG PO CAPS: 600.0000 mg | ORAL_CAPSULE | Freq: Once | ORAL | 0 refills | Status: AC

## 2016-10-02 MED ORDER — CLINDAMYCIN HCL 300 MG PO CAPS: 600.0000 mg | ORAL_CAPSULE | Freq: Once | ORAL | 0 refills | Status: DC

## 2016-10-02 NOTE — Progress Notes (Signed)
Office Visit Note   Patient: Dana [name]           Date of Birth: 08-Dec-1950           MRN: [id number] Visit Date: 10/02/2016              Requested by: Dana Baton, MD [place], [place] [address] PCP: Dana Reel, MD   Assessment & Plan: Visit Diagnoses:  1. Pain in left foot   2. Primary osteoarthritis of right hip     Plan: My impression is first ray pain and mild hallux rigidus from altered gait. Recommend continued ibuprofen and rest. I expect this to improve-her gait normalizes. I did give her a prescription for clindamycin for her upcoming dental cleaning for her antibiotic prophylaxis. I will see her back in 9 months for her 1 year visit with standing AP pelvis  Follow-Up Instructions: Return in about 9 months (around 07/04/2017).   Orders:  Orders Placed This Encounter  Procedures  . XR Foot Complete Left   Meds ordered this encounter  Medications  . DISCONTD: clindamycin (CLEOCIN) 300 MG capsule    Sig: Take 2 capsules (600 mg total) by mouth once.    Dispense:  1 capsule    Refill:  0  . clindamycin (CLEOCIN) 300 MG capsule    Sig: Take 2 capsules (600 mg total) by mouth once.    Dispense:  2 capsule    Refill:  0  . clindamycin (CLEOCIN) 300 MG capsule    Sig: Take 2 capsules (600 mg total) by mouth once.    Dispense:  1 capsule    Refill:  0      Procedures: No procedures performed   Clinical Data: No additional findings.   Subjective: Chief Complaint  Patient presents with  . Left Foot - Pain  . Right Hip - Follow-up    Patient comes in today for a new problem of left foot pain. She originally struck her small toe about 2 weeks ago. Now she has more pain on the first ray. She has been walking with an altered gait because of the pain on the lateral aspect of the foot. Terms of her right hip she is doing well she is about 3 months from surgery. She has no complaints.    Review of Systems  Constitutional: Negative.   HENT:  Negative.   Eyes: Negative.   Respiratory: Negative.   Cardiovascular: Negative.   Endocrine: Negative.   Musculoskeletal: Negative.   Neurological: Negative.   Hematological: Negative.   Psychiatric/Behavioral: Negative.   All other systems reviewed and are negative.    Objective: Vital Signs: There were no vitals taken for this visit.  Physical Exam  Constitutional: She is oriented to person, place, and time. She appears well-developed and well-nourished.  Pulmonary/Chest: Effort normal.  Neurological: She is alert and oriented to person, place, and time.  Skin: Skin is warm. Capillary refill takes less than 2 seconds.  Psychiatric: She has a normal mood and affect. Her behavior is normal. Judgment and thought content normal.  Nursing note and vitals reviewed.   Ortho Exam The left foot exam shows no significant swelling or bruising or acute changes. There is no skin changes. The small toe is not tender to palpation. She has more pain that localizes to the first ray. She has good range of motion of the great toe MP joint. Specialty Comments:  No specialty comments available.  Imaging: Xr Foot Complete Left  Result Date: 10/02/2016 No acute or structural abnormalities. Mild hallux rigidus    PMFS History: Patient Active Problem List   Diagnosis Date Noted  . Hip joint replacement status 07/13/2016  . Primary osteoarthritis of right hip 06/01/2016  . Uterine cancer ([place]) 06/03/2015  . Diarrhea 06/03/2015   Past Medical History:  Diagnosis Date  . Arthritis   . GERD (gastroesophageal reflux disease)   . History of blood transfusion   . Hypothyroidism   . Lymphedema    left leg- ted hose makes it worse  . Thyroid disease   . Uterine cancer [facility])    Endometrial cancer - surgery and chemo    Family History  Problem Relation Age of Onset  . Heart disease Mother   . Multiple myeloma Mother     Past Surgical History:  Procedure Laterality Date  . ABDOMINAL  HYSTERECTOMY    . APPENDECTOMY    . COLONOSCOPY W/ POLYPECTOMY    . EYE SURGERY Bilateral    cataracts  . LIVER BIOPSY    . mass removed     2015 from pelvis  . TOTAL HIP ARTHROPLASTY Right 07/13/2016   Procedure: RIGHT TOTAL HIP ARTHROPLASTY ANTERIOR APPROACH;  Surgeon: Dana Koyanagi, MD;  Location: [facility];  Service: Orthopedics;  Laterality: Right;   Social History   Occupational History  . food Nature conservation officer    Social History Main Topics  . Smoking status: Never Smoker  . Smokeless tobacco: Never Used  . Alcohol use No  . Drug use: No  . Sexual activity: Not on file

## 2016-10-04 ENCOUNTER — Telehealth (INDEPENDENT_AMBULATORY_CARE_PROVIDER_SITE_OTHER): Payer: Self-pay

## 2016-10-04 NOTE — Telephone Encounter (Signed)
Called pt and she states on May 1st she is having her teeth cleaned and later that week she will be having a procedure done. Will she need the ABx twice (each visit) or is she okay with ABx for for 1 day?  Please advise.

## 2016-10-04 NOTE — Telephone Encounter (Signed)
Yes, clindamycin 600 mg each time

## 2016-10-04 NOTE — Telephone Encounter (Signed)
-----   Message from Carl Best sent at 10/04/2016 11:22 AM EDT ----- Regarding: Rx for antibiotic Contact: [phone] Pt left a message asking when she is to take the antibiotic for dental work. Day of dental apt or day before? Please advise, thank you!

## 2016-10-05 ENCOUNTER — Ambulatory Visit (INDEPENDENT_AMBULATORY_CARE_PROVIDER_SITE_OTHER): Payer: BLUE CROSS/BLUE SHIELD | Admitting: Orthopaedic Surgery

## 2016-10-05 MED ORDER — CLINDAMYCIN HCL 300 MG PO CAPS: ORAL_CAPSULE | ORAL | 0 refills | Status: DC

## 2016-10-05 NOTE — Telephone Encounter (Signed)
Called pt she states she got Rx from Dr Erlinda Hong last time but needs another for next time she goes (cleaning & procedure)

## 2016-10-05 NOTE — Addendum Note (Signed)
Addended by: Precious Bard on: 10/05/2016 04:56 PM   Modules accepted: Orders

## 2016-10-20 ENCOUNTER — Telehealth (INDEPENDENT_AMBULATORY_CARE_PROVIDER_SITE_OTHER): Payer: Self-pay

## 2016-10-20 NOTE — Telephone Encounter (Signed)
Patient called and she states she is not getting paid for 1 day. She had her last examination 10/02/16. Dr Erlinda Hong took her OOW until 10/05/16. They need a medical reason why she had to be out until 10/05/16. Please advise.     CB [phone]

## 2016-10-20 NOTE — Telephone Encounter (Signed)
Patient is still recovering from her surgery.  Expected oow is approximately 3 months.  I wanted her to take the extra days in order to be ready to return to work

## 2016-10-23 ENCOUNTER — Other Ambulatory Visit (INDEPENDENT_AMBULATORY_CARE_PROVIDER_SITE_OTHER): Payer: Self-pay

## 2016-10-24 NOTE — Telephone Encounter (Signed)
Emailed work note to pt per pt.  Email: [email or web address]

## 2016-11-07 ENCOUNTER — Telehealth (INDEPENDENT_AMBULATORY_CARE_PROVIDER_SITE_OTHER): Payer: Self-pay | Admitting: Orthopaedic Surgery

## 2016-11-07 NOTE — Telephone Encounter (Signed)
Pt called and asked if you can re-email the same document from 10/24/16 you had sent her. She cannot find in her emails.

## 2016-11-08 NOTE — Telephone Encounter (Signed)
LMOM on her voicemail

## 2016-11-08 NOTE — Telephone Encounter (Signed)
Re emailed yesterday

## 2017-02-06 ENCOUNTER — Telehealth (INDEPENDENT_AMBULATORY_CARE_PROVIDER_SITE_OTHER): Payer: Self-pay

## 2017-02-06 NOTE — Telephone Encounter (Signed)
Patient would like a Rx for clindamycin for her dentist appt's.  Stated that she has 3 dentist appt's.  CB# is [phone].  Please advise.  Thank You.

## 2017-02-12 ENCOUNTER — Other Ambulatory Visit (INDEPENDENT_AMBULATORY_CARE_PROVIDER_SITE_OTHER): Payer: Self-pay

## 2017-02-12 MED ORDER — CLINDAMYCIN HCL 300 MG PO CAPS: ORAL_CAPSULE | ORAL | 0 refills | Status: DC

## 2017-05-11 ENCOUNTER — Telehealth (INDEPENDENT_AMBULATORY_CARE_PROVIDER_SITE_OTHER): Payer: Self-pay | Admitting: Orthopaedic Surgery

## 2017-05-11 MED ORDER — CLINDAMYCIN HCL 300 MG PO CAPS: ORAL_CAPSULE | ORAL | 0 refills | Status: DC

## 2017-05-11 NOTE — Telephone Encounter (Signed)
Please advise what you want me to send in. Thanks.

## 2017-05-11 NOTE — Telephone Encounter (Signed)
2 g of amoxicillin 30 minutes before dental procedure

## 2017-05-11 NOTE — Telephone Encounter (Signed)
Sent to pharmacy and advised patient done.

## 2017-05-11 NOTE — Telephone Encounter (Signed)
Patient states she need premeds for a dental cleaning next week.

## 2017-06-18 ENCOUNTER — Ambulatory Visit (INDEPENDENT_AMBULATORY_CARE_PROVIDER_SITE_OTHER): Payer: BLUE CROSS/BLUE SHIELD | Admitting: Orthopaedic Surgery

## 2017-06-26 ENCOUNTER — Ambulatory Visit (INDEPENDENT_AMBULATORY_CARE_PROVIDER_SITE_OTHER): Payer: BLUE CROSS/BLUE SHIELD | Admitting: Orthopaedic Surgery

## 2017-06-29 ENCOUNTER — Telehealth (INDEPENDENT_AMBULATORY_CARE_PROVIDER_SITE_OTHER): Payer: Self-pay | Admitting: Orthopaedic Surgery

## 2017-06-29 NOTE — Telephone Encounter (Signed)
Patient called and stated picked up handicap forms went to [facility] they will nor accept it because a line was drawn through the date.  Please call patient to advise when she can get another one.  [phone]

## 2017-09-03 IMAGING — RF DG HIP (WITH PELVIS) OPERATIVE*L*
1 series · 5 of 5 positions shown · non-contrast
Comparison: None.

CLINICAL DATA: Right total hip arthroplasty.

EXAM:
OPERATIVE  HIP (WITH PELVIS IF PERFORMED)  VIEWS
TECHNIQUE: Fluoroscopic spot image(s) were submitted for interpretation
post-operatively.

[Series 1: run · 5 of 5 slices shown]
[im 1/5]
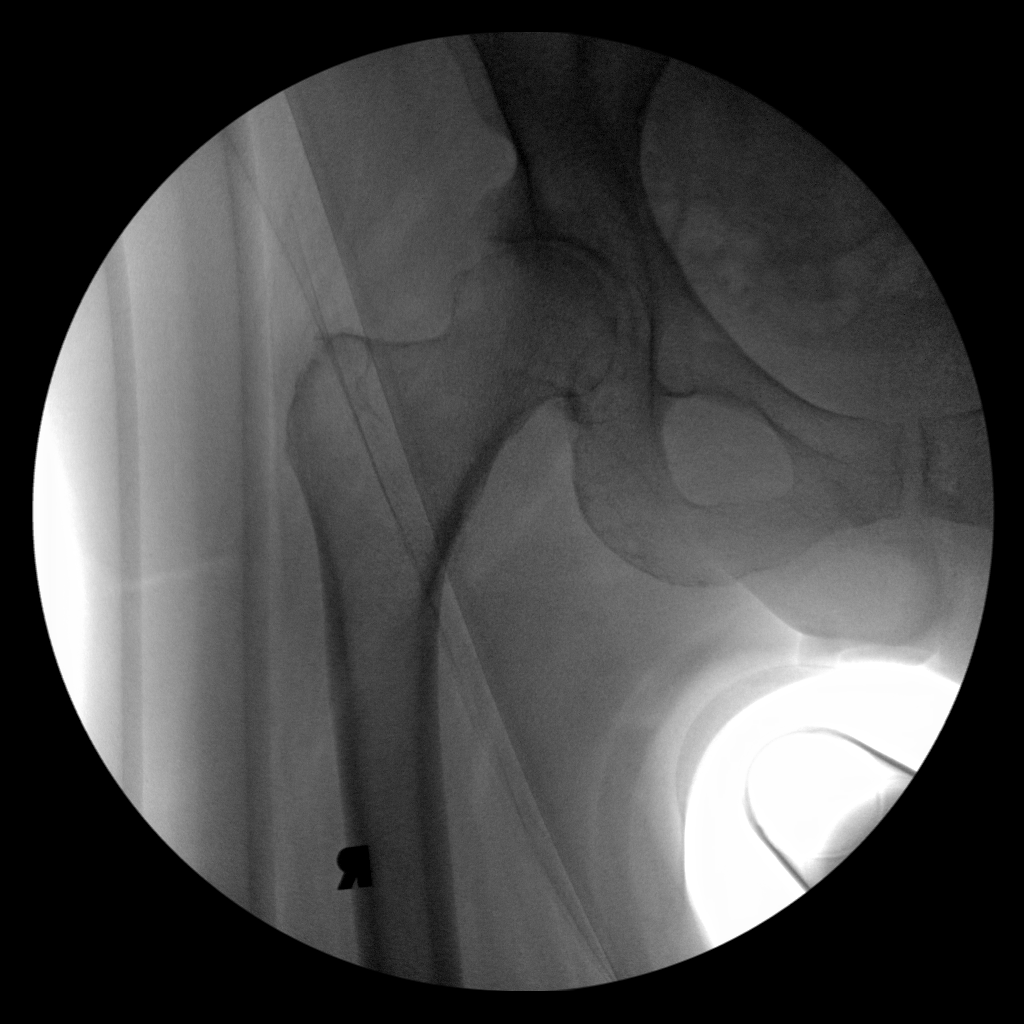
[im 2/5]
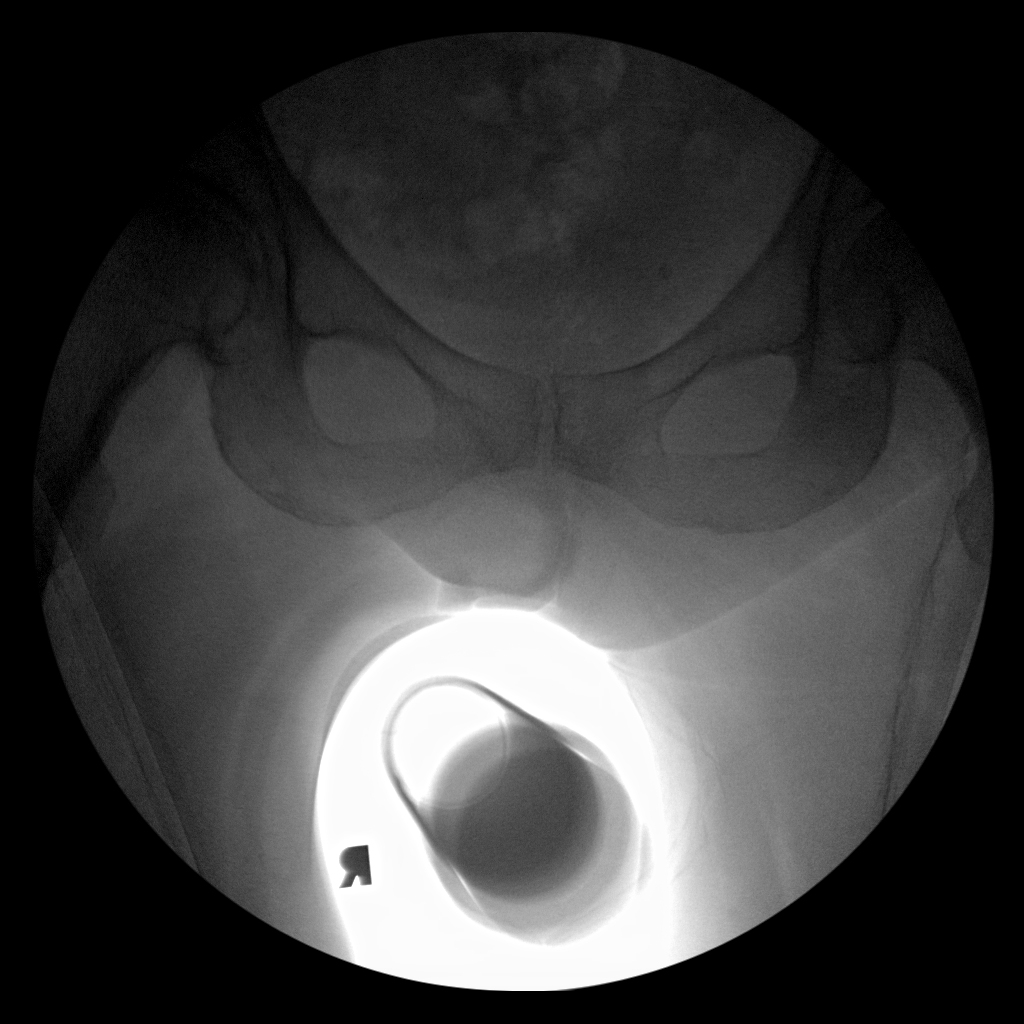
[im 3/5]
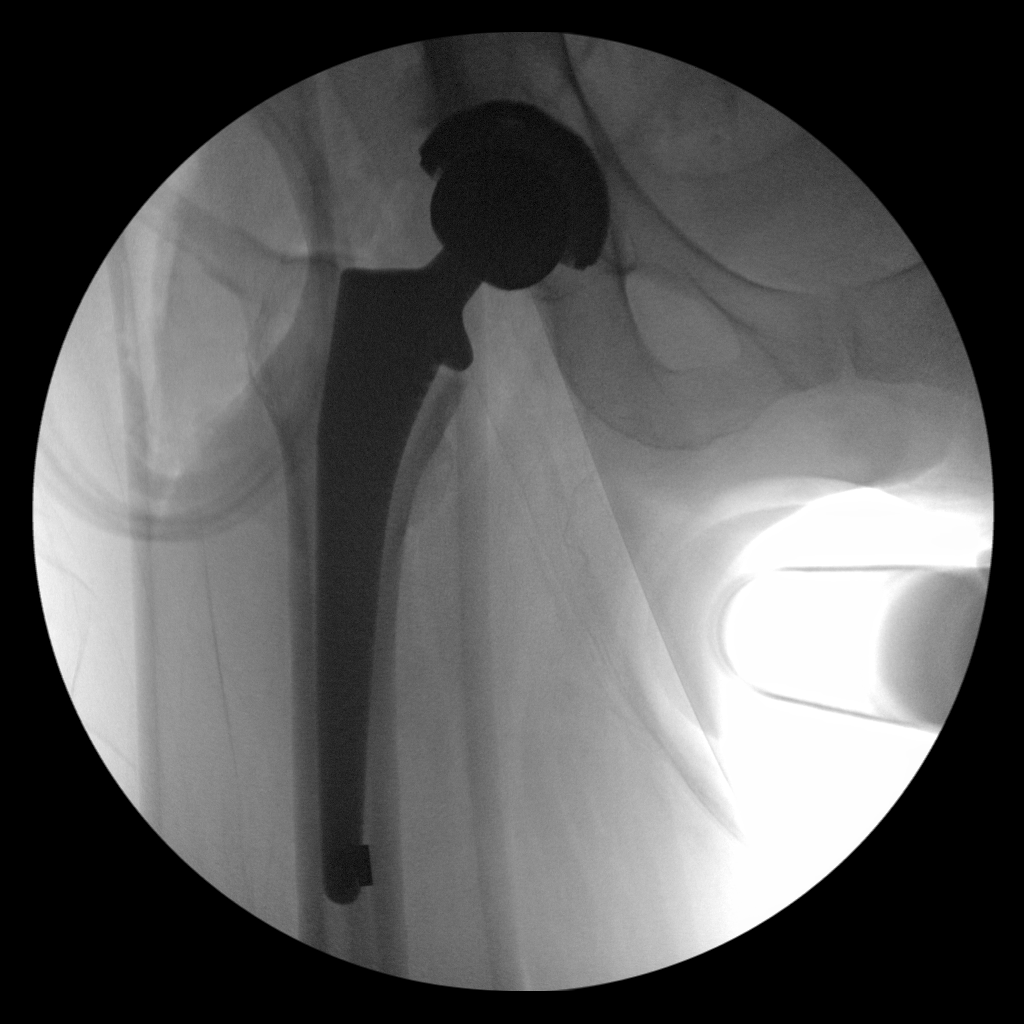
[im 4/5]
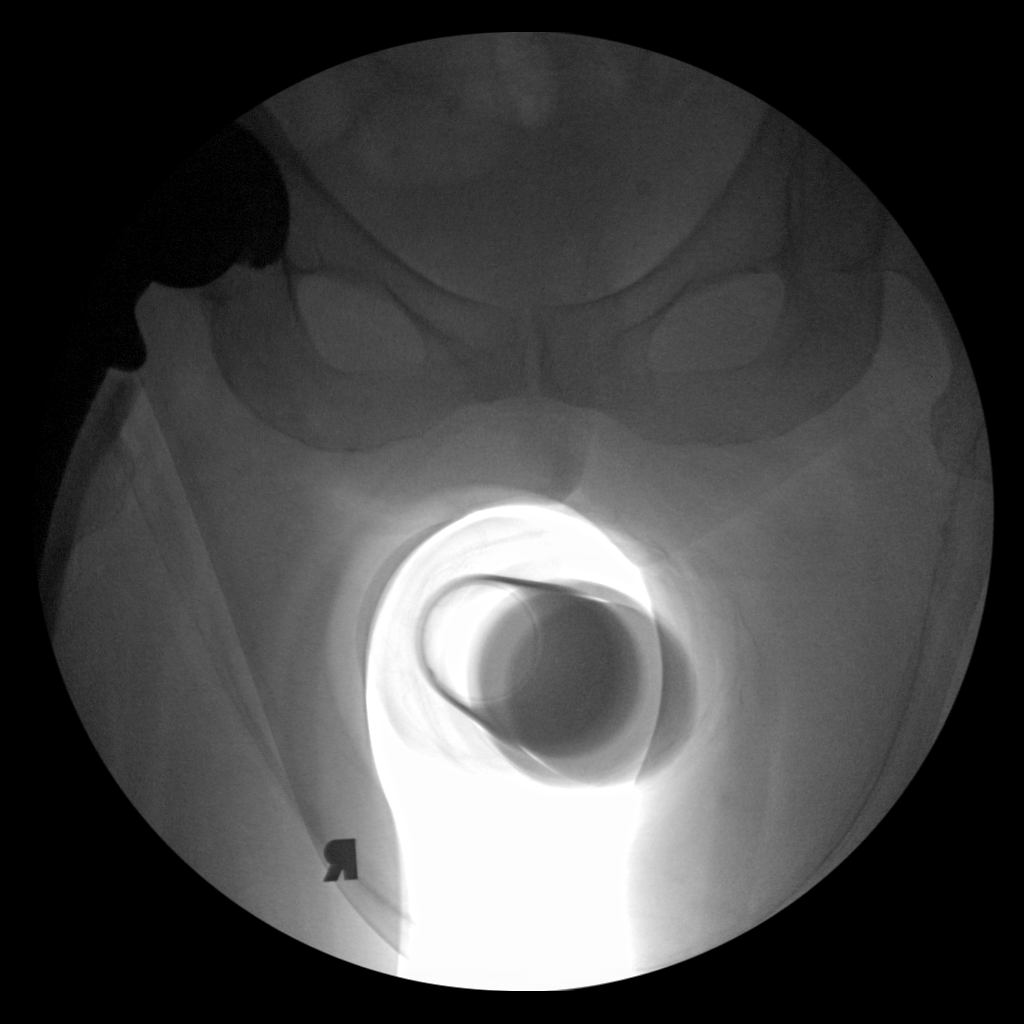
[im 5/5]
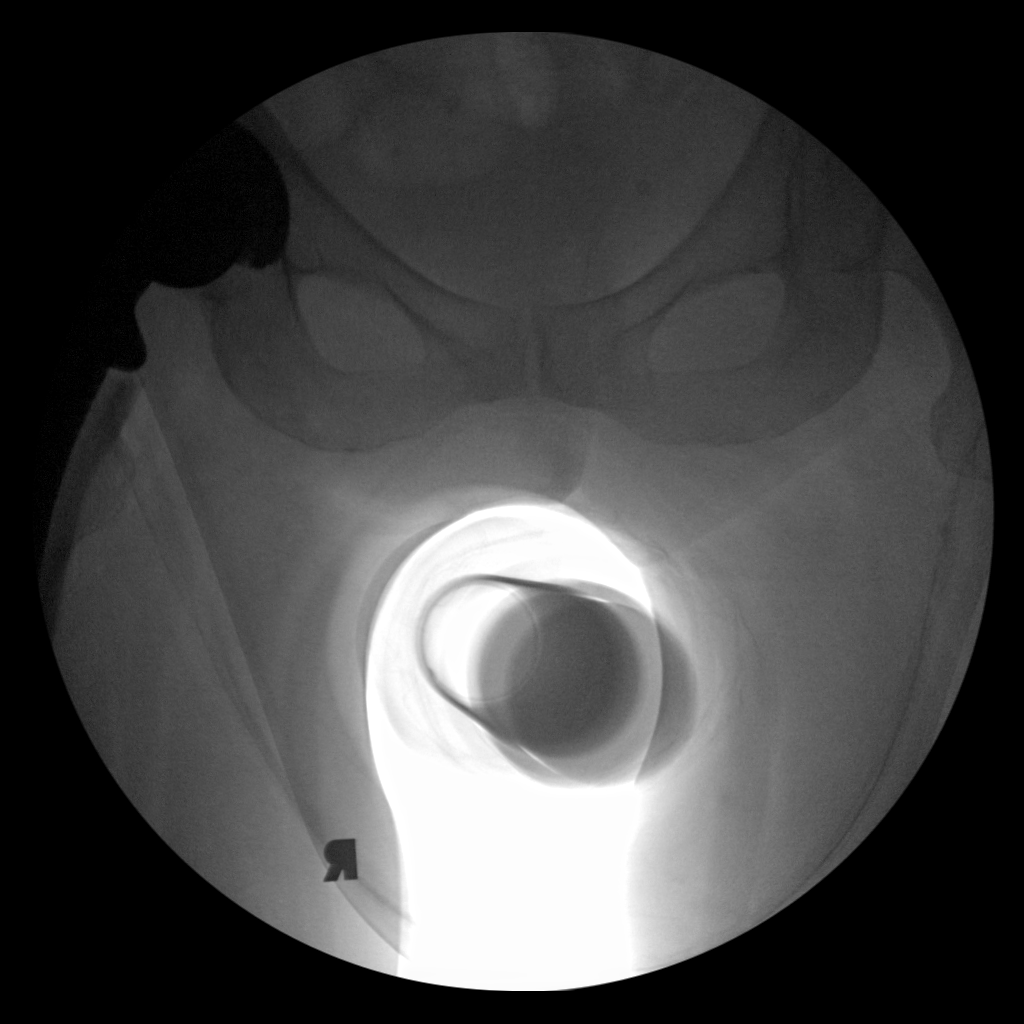

[5 of 5 positions shown; findings below may reference images not displayed]

FINDINGS: Three images are submitted. At right total hip arthroplasty was
performed. No immediate complications are evident.
IMPRESSION: Right total hip arthroplasty without radiographic evidence for
complication on a single AP intraoperative spot image.

## 2017-09-03 IMAGING — CR DG PORTABLE PELVIS
2 series · 2 of 2 positions shown · non-contrast
Comparison: Intraoperative fluoroscopic radiographs from right hip
arthroplasty from earlier today.

CLINICAL DATA: Status post right total hip arthroplasty

EXAM:
PORTABLE PELVIS 1-2 VIEWS

[AP (1 of 2)]
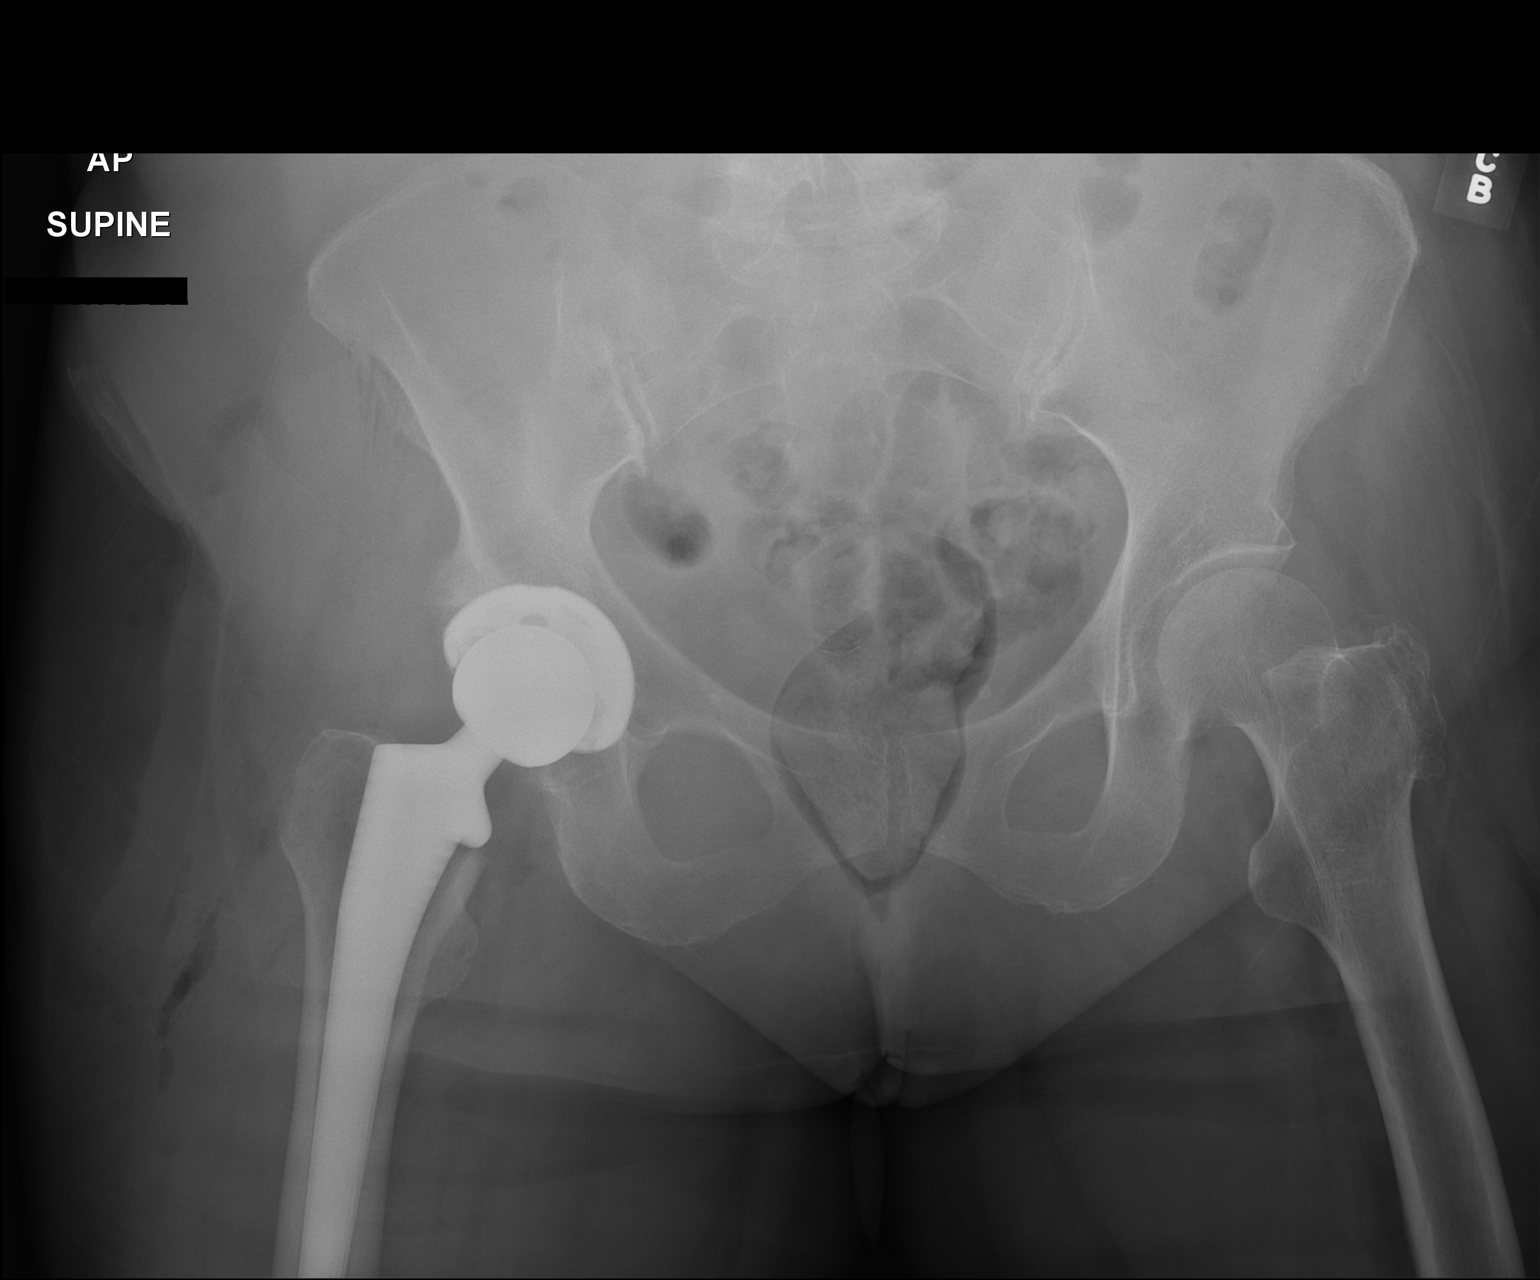

[AP (2 of 2)]
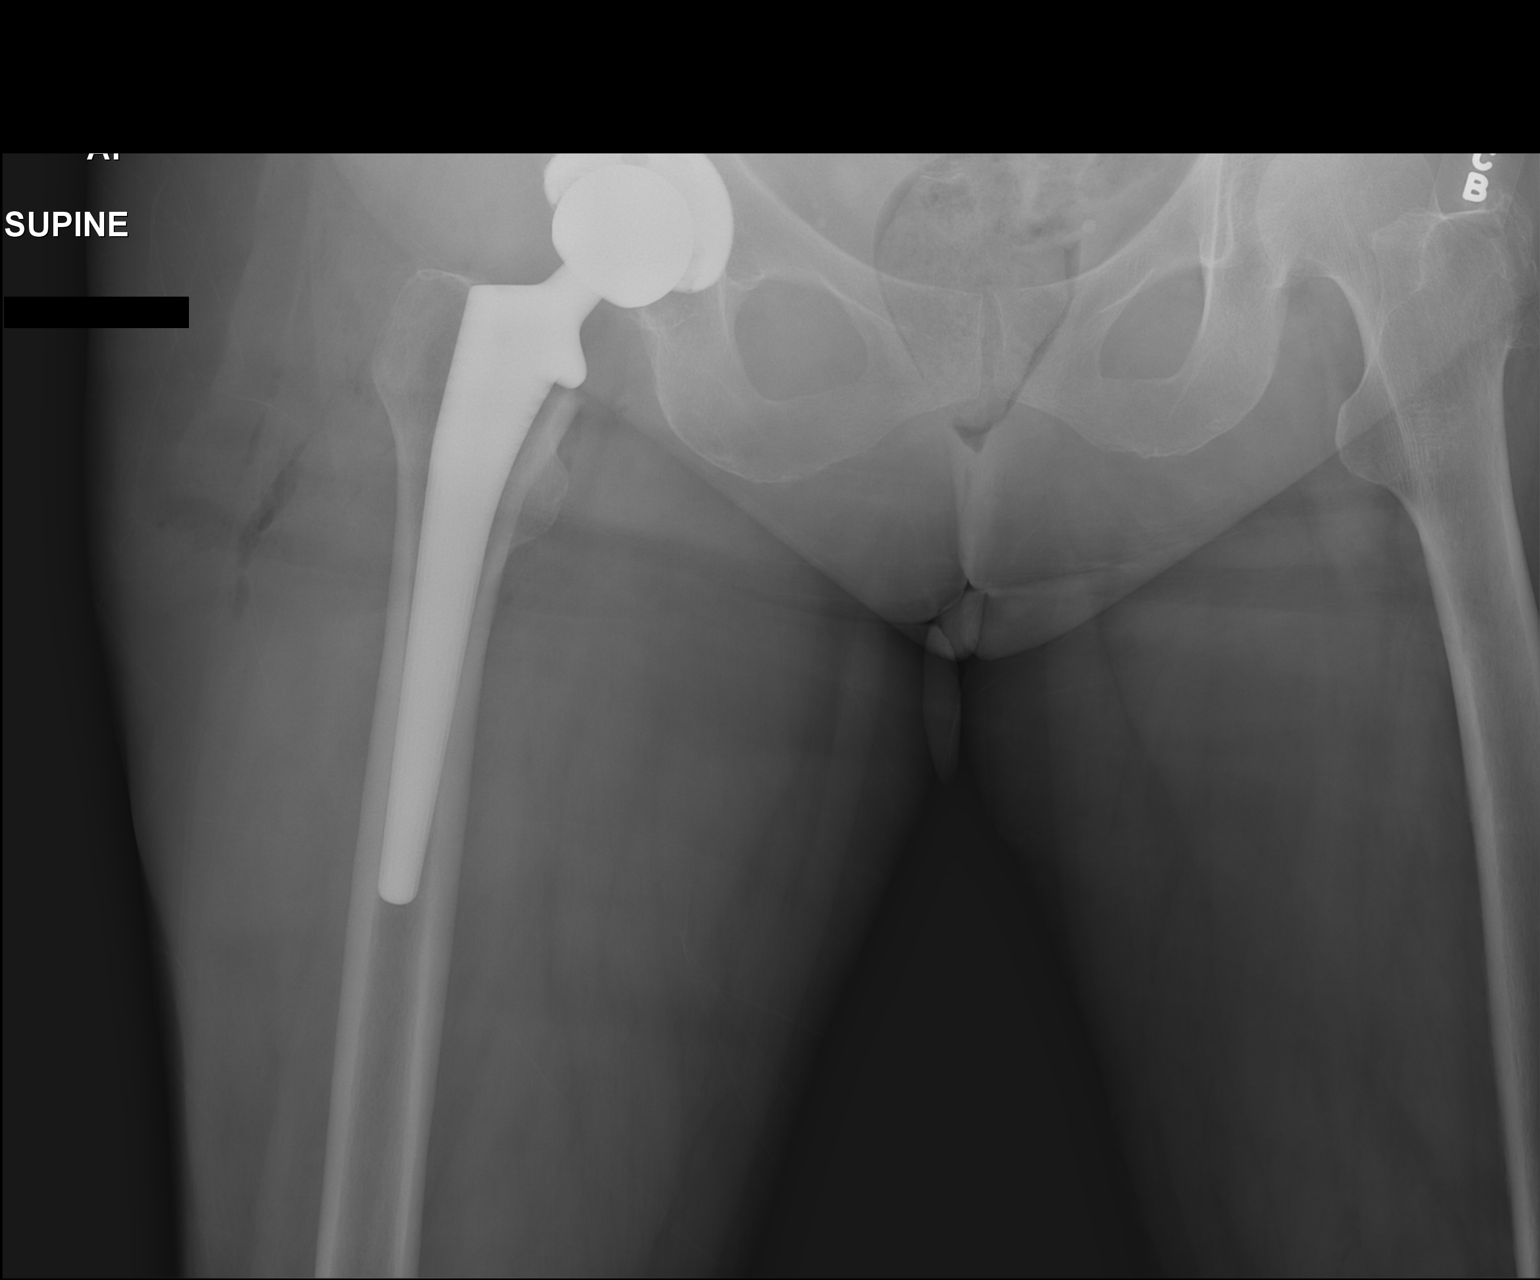

[2 of 2 positions shown; findings below may reference images not displayed]

FINDINGS: Status post right total hip arthroplasty with well-positioned right
acetabular and right proximal femoral prostheses, with no evidence
of hardware fracture or loosening. No evidence of hip dislocation on
the single frontal views. No osseous fracture. No suspicious focal
osseous lesion. Spondylosis in the visualized lower lumbar spine.
Expected postsurgical soft tissue gas surrounding the right hip
joint.
IMPRESSION: Satisfactory postoperative appearance status post right hip
arthroplasty.

## 2018-05-03 ENCOUNTER — Other Ambulatory Visit (HOSPITAL_COMMUNITY): Payer: Self-pay | Admitting: Internal Medicine

## 2018-05-03 ENCOUNTER — Ambulatory Visit (HOSPITAL_COMMUNITY)
Admission: RE | Admit: 2018-05-03 | Discharge: 2018-05-03 | Disposition: A | Discharge status: Home / Self Care | Payer: BLUE CROSS/BLUE SHIELD | Source: Ambulatory Visit | Attending: Family | Admitting: Family

## 2018-05-03 DIAGNOSIS — R42 Dizziness and giddiness: Secondary | ICD-10-CM | POA: Diagnosis present

## 2019-03-26 ENCOUNTER — Ambulatory Visit (HOSPITAL_COMMUNITY)
Admission: EM | Admit: 2019-03-26 | Discharge: 2019-03-26 | Disposition: A | Discharge status: Home / Self Care | Payer: 59 | Attending: Family Medicine | Admitting: Family Medicine

## 2019-03-26 ENCOUNTER — Ambulatory Visit (INDEPENDENT_AMBULATORY_CARE_PROVIDER_SITE_OTHER): Payer: 59

## 2019-03-26 ENCOUNTER — Encounter (HOSPITAL_COMMUNITY): Payer: Self-pay | Admitting: Emergency Medicine

## 2019-03-26 ENCOUNTER — Other Ambulatory Visit: Payer: Self-pay

## 2019-03-26 DIAGNOSIS — M546 Pain in thoracic spine: Secondary | ICD-10-CM

## 2019-03-26 DIAGNOSIS — R109 Unspecified abdominal pain: Secondary | ICD-10-CM

## 2019-03-26 LAB — POCT URINALYSIS DIP (DEVICE)
Bilirubin Urine: NEGATIVE
Glucose, UA: NEGATIVE mg/dL
Ketones, ur: NEGATIVE mg/dL
Leukocytes,Ua: NEGATIVE
Nitrite: NEGATIVE
Protein, ur: NEGATIVE mg/dL
Specific Gravity, Urine: 1.015 (ref 1.005–1.030)
Urobilinogen, UA: 0.2 mg/dL (ref 0.0–1.0)
pH: 6 (ref 5.0–8.0)

## 2019-03-26 MED ORDER — TRAMADOL HCL 50 MG PO TABS: 50.0000 mg | ORAL_TABLET | Freq: Four times a day (QID) | ORAL | 0 refills | Status: AC | PRN

## 2019-03-26 MED ORDER — TIZANIDINE HCL 4 MG PO TABS: 4.0000 mg | ORAL_TABLET | Freq: Four times a day (QID) | ORAL | 0 refills | Status: DC | PRN

## 2019-03-26 MED ORDER — ONDANSETRON 4 MG PO TBDP: 4.0000 mg | ORAL_TABLET | Freq: Three times a day (TID) | ORAL | 0 refills | Status: DC | PRN

## 2019-03-26 NOTE — Discharge Instructions (Addendum)
There was some blood in your urine today, x ray did not show kidney stones but this is not the best test for kidney stones. X ray showed some stool and gas which could be constipation.  The x ray also showed gallstones. This could be a gall bladder issue causing the pain that you are having.  I believe that you need an ultrasound.  Call your doctor to schedule this.  I will give you something for pain and nausea and if your symptoms worsen to include more severe pain, nausea or vomiting you will need to go to the hospital .

## 2019-03-26 NOTE — ED Provider Notes (Signed)
[facility]    CSN: [id number] Arrival date & time: 03/26/19  1258      History   Chief Complaint Chief Complaint  Patient presents with  . Appointment    1310  . Back Pain    HPI Dana [name] is a 68 y.o. female.   Patient is a 68 year old female with past medical history of arthritis, GERD, hypothyroidism, lymphedema, thyroid disease, uterine cancer, high hip replacement.  She presents today with approximately 5 days of right mid/upper back pain.  The pain is constant, waxing and waning.   Certain movements can exacerbate the pain but the pain is also there with sitting.  The pain radiates up her back towards her shoulder blade and around her flank into her groin area.  The pain is more intense at times than others.  She has been taking Aleve for the pain without any relief.  Slightly nauseous at times.  Denies any nausea currently.  No vomiting or diarrhea.  Last bowel movement was this morning and she reports is normal.  No blood in stool.  No fever, chills, body aches, dysuria, hematuria or urinary frequency.  She has had increased thirst. No numbess, tingling.   Patient has had a hysterectomy and appendectomy.   ROS per HPI      Past Medical History:  Diagnosis Date  . Arthritis   . GERD (gastroesophageal reflux disease)   . History of blood transfusion   . Hypothyroidism   . Lymphedema    left leg- ted hose makes it worse  . Thyroid disease   . Uterine cancer [facility])    Endometrial cancer - surgery and chemo    Patient Active Problem List   Diagnosis Date Noted  . Hip joint replacement status 07/13/2016  . Primary osteoarthritis of right hip 06/01/2016  . Uterine cancer ([place]) 06/03/2015  . Diarrhea 06/03/2015    Past Surgical History:  Procedure Laterality Date  . ABDOMINAL HYSTERECTOMY    . APPENDECTOMY    . COLONOSCOPY W/ POLYPECTOMY    . EYE SURGERY Bilateral    cataracts  . LIVER BIOPSY    . mass removed     2015 from pelvis  . TOTAL  HIP ARTHROPLASTY Right 07/13/2016   Procedure: RIGHT TOTAL HIP ARTHROPLASTY ANTERIOR APPROACH;  Surgeon: Leandrew Koyanagi, MD;  Location: [facility];  Service: Orthopedics;  Laterality: Right;    OB History   No obstetric history on file.      Home Medications    Prior to Admission medications   Medication Sig Start Date End Date Taking? Authorizing Provider  bismuth subsalicylate (PEPTO BISMOL) 262 MG chewable tablet Chew 262 mg by mouth as needed for indigestion.    [provider]  Cholecalciferol (VITAMIN D) 2000 units CAPS Take 4,000 Units by mouth daily.    [provider]  clindamycin (CLEOCIN) 300 MG capsule TAKE 2 TABS 30 MINS BEFORE PROCEDURE 05/11/17   Leandrew Koyanagi, MD  levothyroxine (SYNTHROID, LEVOTHROID) 50 MCG tablet Take 50 mcg by mouth daily before breakfast.    [provider]  ondansetron (ZOFRAN ODT) 4 MG disintegrating tablet Take 1 tablet (4 mg total) by mouth every 8 (eight) hours as needed for nausea or vomiting. 03/26/19   Loura Halt A, NP  tiZANidine (ZANAFLEX) 4 MG tablet Take 1 tablet (4 mg total) by mouth every 6 (six) hours as needed for muscle spasms. 03/26/19   Loura Halt A, NP  traMADol (ULTRAM) 50 MG tablet  Take 1 tablet (50 mg total) by mouth every 6 (six) hours as needed for up to 3 days. 03/26/19 03/29/19  Loura Halt A, NP  promethazine (PHENERGAN) 25 MG tablet Take 1 tablet (25 mg total) by mouth every 6 (six) hours as needed for nausea. Patient not taking: Reported on 08/24/2016 07/13/16 03/26/19  Leandrew Koyanagi, MD    Family History Family History  Problem Relation Age of Onset  . Heart disease Mother   . Multiple myeloma Mother     Social History Social History   Tobacco Use  . Smoking status: Never Smoker  . Smokeless tobacco: Never Used  Substance Use Topics  . Alcohol use: No    Alcohol/week: 0.0 standard drinks  . Drug use: No     Allergies   Chicken allergy, Garlic, Other, and Penicillins   Review of Systems  Review of Systems   Physical Exam Triage Vital Signs ED Triage Vitals [03/26/19 1320]  Enc Vitals Group     BP (!) 157/86     Pulse Rate 67     Resp 16     Temp 98.2 F (36.8 C)     Temp Source Temporal     SpO2 97 %     Weight      Height      Head Circumference      Peak Flow      Pain Score 8     Pain Loc      Pain Edu?      Excl. in [place]?    No data found.  Updated Vital Signs BP (!) 157/86 (BP Location: Left Arm)   Pulse 67   Temp 98.2 F (36.8 C) (Temporal)   Resp 16   SpO2 97%   Visual Acuity Right Eye Distance:   Left Eye Distance:   Bilateral Distance:    Right Eye Near:   Left Eye Near:    Bilateral Near:     Physical Exam Vitals signs and nursing note reviewed.  Constitutional:      General: She is not in acute distress.    Appearance: Normal appearance. She is not ill-appearing, toxic-appearing or diaphoretic.  HENT:     Head: Normocephalic and atraumatic.     Nose: Nose normal.  Eyes:     Conjunctiva/sclera: Conjunctivae normal.  Neck:     Musculoskeletal: Normal range of motion.  Cardiovascular:     Rate and Rhythm: Normal rate and regular rhythm.     Pulses: Normal pulses.     Heart sounds: Normal heart sounds.  Pulmonary:     Effort: Pulmonary effort is normal.     Breath sounds: Normal breath sounds.  Abdominal:     Palpations: Abdomen is soft.     Tenderness: There is right CVA tenderness. There is no guarding.  Musculoskeletal: Normal range of motion.        General: Tenderness present.     Thoracic back: She exhibits tenderness. She exhibits no swelling, no edema, no deformity, no laceration, no pain, no spasm and normal pulse.       Back:       Arms:  Skin:    Findings: No rash.  Neurological:     General: No focal deficit present.     Mental Status: She is alert.  Psychiatric:        Mood and Affect: Mood normal.      UC Treatments / Results  Labs (all labs ordered are listed, but only  abnormal results are  displayed) Labs Reviewed  POCT URINALYSIS DIP (DEVICE) - Abnormal; Notable for the following components:      Result Value   Hgb urine dipstick TRACE (*)    All other components within normal limits    EKG   Radiology Dg Abd 2 Views  Result Date: 03/26/2019 CLINICAL DATA:  68 year old female with history of flank pain and back pain. Right upper quadrant abdominal pain. EXAM: ABDOMEN - 2 VIEW COMPARISON:  No priors. FINDINGS: Gas and stool are seen scattered throughout the colon extending to the level of the distal rectum. No pathologic distension of small bowel is noted. No gross evidence of pneumoperitoneum. Faintly visualized rounded opacity projecting over the lower margin of the right lobe of the liver on the first image measuring 1.3 cm, suspicious for a gallstone. Small calcifications in the pelvis with central lucency, most compatible with small phleboliths. No definite calcifications projecting over either kidney or the expected course of either ureter. Surgical clips projecting over the mid abdomen. Status post right hip arthroplasty. IMPRESSION: 1. No definite urinary tract calculi confidently identified on today's plain film evaluation. 2. However, there may be a partially calcified gallstone. If there is clinical concern for cholelithiasis, further evaluation with right upper quadrant abdominal ultrasound would be recommended. Electronically Signed   By: Vinnie Langton M.D.   On: 03/26/2019 14:15    Procedures Procedures (including critical care time)  Medications Ordered in UC Medications - No data to display  Initial Impression / Assessment and Plan / UC Course  I have reviewed the triage vital signs and the nursing notes.  Pertinent labs & imaging results that were available during my care of the patient were reviewed by me and considered in my medical decision making (see chart for details).     Patient is a 68 year old female who presents with right upper back/right  flank pain that is been waxing waning over the past 5 days.  Vital signs stable here today she is nontoxic or ill-appearing. No specific abdominal discomfort or acute abdomen on exam. Patient did have some blood in her urine but was without infection No concern for pyelonephritis or UTI at this time. Not super tender on palpation of the right thoracic paravertebral musculature.  No rashes, bruising or swelling. Mild right CVA tenderness X-ray revealed stool and gas scattered throughout the colon along with possible  gallstone Recommended follow-up with ultrasound if suspicious for Cholelithiasis. No obvious kidney stones Will have patient call her PCP to schedule appointment for tomorrow for further evaluation with CT scan or ultrasound No need to send to the ER today, patient otherwise stable. We will give her something for pain and nausea to use as needed along with a muscle relaxer. Recommended trying some MiraLAX for constipation Drink plenty of fluids and if for any reason symptoms get more severe to include severe pain, nausea or vomiting she will need to go the hospital. Pt understanding and agreed.  Final Clinical Impressions(s) / UC Diagnoses   Final diagnoses:  Acute right-sided thoracic back pain  Flank pain     Discharge Instructions     There was some blood in your urine today, x ray did not show kidney stones but this is not the best test for kidney stones. X ray showed some stool and gas which could be constipation.  The x ray also showed gallstones. This could be a gall bladder issue causing the pain that you are having.  I believe that  you need an ultrasound.  Call your doctor to schedule this.  I will give you something for pain and nausea and if your symptoms worsen to include more severe pain, nausea or vomiting you will need to go to the hospital .      ED Prescriptions    Medication Sig Dispense Auth. Provider   traMADol (ULTRAM) 50 MG tablet Take 1 tablet  (50 mg total) by mouth every 6 (six) hours as needed for up to 3 days. 12 tablet Viridiana Spaid A, NP   ondansetron (ZOFRAN ODT) 4 MG disintegrating tablet Take 1 tablet (4 mg total) by mouth every 8 (eight) hours as needed for nausea or vomiting. 20 tablet Dierdra Salameh A, NP   tiZANidine (ZANAFLEX) 4 MG tablet Take 1 tablet (4 mg total) by mouth every 6 (six) hours as needed for muscle spasms. 30 tablet Loura Halt A, NP     Controlled Substance Prescriptions [place] Controlled Substance Registry consulted? Not Applicable   Orvan July, NP 03/26/19 1550

## 2019-03-26 NOTE — ED Triage Notes (Signed)
Pt sts right sided mid back pain x 2 days; denies obvious injury

## 2019-03-27 ENCOUNTER — Other Ambulatory Visit: Payer: Self-pay | Admitting: Internal Medicine

## 2019-03-27 DIAGNOSIS — M549 Dorsalgia, unspecified: Secondary | ICD-10-CM

## 2019-03-28 ENCOUNTER — Other Ambulatory Visit: Payer: Self-pay | Admitting: Internal Medicine

## 2019-03-28 ENCOUNTER — Ambulatory Visit
Admission: RE | Admit: 2019-03-28 | Discharge: 2019-03-28 | Disposition: A | Discharge status: Home / Self Care | Payer: 59 | Source: Ambulatory Visit | Attending: Internal Medicine | Admitting: Internal Medicine

## 2019-03-28 DIAGNOSIS — M549 Dorsalgia, unspecified: Secondary | ICD-10-CM

## 2019-03-28 MED ORDER — IOPAMIDOL (ISOVUE-300) INJECTION 61%
100.0000 mL | Freq: Once | INTRAVENOUS | Status: AC | PRN
  Administered 2019-03-28: 100 mL via INTRAVENOUS

## 2019-04-01 ENCOUNTER — Other Ambulatory Visit (HOSPITAL_COMMUNITY): Payer: Self-pay | Admitting: Internal Medicine

## 2019-04-01 DIAGNOSIS — Z8542 Personal history of malignant neoplasm of other parts of uterus: Secondary | ICD-10-CM

## 2019-04-01 DIAGNOSIS — R599 Enlarged lymph nodes, unspecified: Secondary | ICD-10-CM

## 2019-04-01 DIAGNOSIS — R590 Localized enlarged lymph nodes: Secondary | ICD-10-CM

## 2019-04-01 DIAGNOSIS — R591 Generalized enlarged lymph nodes: Secondary | ICD-10-CM

## 2019-04-09 ENCOUNTER — Other Ambulatory Visit: Payer: Self-pay | Admitting: Radiology

## 2019-04-09 ENCOUNTER — Other Ambulatory Visit: Payer: Self-pay | Admitting: Physician Assistant

## 2019-04-10 ENCOUNTER — Ambulatory Visit (HOSPITAL_COMMUNITY)
Admission: RE | Admit: 2019-04-10 | Discharge: 2019-04-10 | Disposition: A | Discharge status: Home / Self Care | Payer: 59 | Source: Ambulatory Visit | Attending: Internal Medicine | Admitting: Internal Medicine

## 2019-04-10 ENCOUNTER — Other Ambulatory Visit: Payer: Self-pay

## 2019-04-10 ENCOUNTER — Encounter (HOSPITAL_COMMUNITY): Payer: Self-pay

## 2019-04-10 DIAGNOSIS — Z8542 Personal history of malignant neoplasm of other parts of uterus: Secondary | ICD-10-CM | POA: Diagnosis not present

## 2019-04-10 DIAGNOSIS — R591 Generalized enlarged lymph nodes: Secondary | ICD-10-CM | POA: Insufficient documentation

## 2019-04-10 DIAGNOSIS — R590 Localized enlarged lymph nodes: Secondary | ICD-10-CM | POA: Diagnosis present

## 2019-04-10 DIAGNOSIS — R599 Enlarged lymph nodes, unspecified: Secondary | ICD-10-CM

## 2019-04-10 LAB — PROTIME-INR
INR: 1 (ref 0.8–1.2)
Prothrombin Time: 12.6 seconds (ref 11.4–15.2)

## 2019-04-10 LAB — CBC
HCT: 47 % — ABNORMAL HIGH (ref 36.0–46.0)
Hemoglobin: 15.3 g/dL — ABNORMAL HIGH (ref 12.0–15.0)
MCH: 30.5 pg (ref 26.0–34.0)
MCHC: 32.6 g/dL (ref 30.0–36.0)
MCV: 93.6 fL (ref 80.0–100.0)
Platelets: 292 10*3/uL (ref 150–400)
RBC: 5.02 MIL/uL (ref 3.87–5.11)
RDW: 12.9 % (ref 11.5–15.5)
WBC: 6.8 10*3/uL (ref 4.0–10.5)
nRBC: 0 % (ref 0.0–0.2)

## 2019-04-10 MED ORDER — MIDAZOLAM HCL 2 MG/2ML IJ SOLN
INTRAMUSCULAR | Status: AC | PRN
  Administered 2019-04-10: 1 mg via INTRAVENOUS

## 2019-04-10 MED ORDER — MIDAZOLAM HCL 2 MG/2ML IJ SOLN
INTRAMUSCULAR | Status: AC
  Filled 2019-04-10: qty 2

## 2019-04-10 MED ORDER — SODIUM CHLORIDE 0.9 % IV SOLN: INTRAVENOUS | Status: DC

## 2019-04-10 MED ORDER — FENTANYL CITRATE (PF) 100 MCG/2ML IJ SOLN
INTRAMUSCULAR | Status: AC | PRN
  Administered 2019-04-10: 50 ug via INTRAVENOUS

## 2019-04-10 MED ORDER — LIDOCAINE-EPINEPHRINE 1 %-1:100000 IJ SOLN
INTRAMUSCULAR | Status: AC
  Filled 2019-04-10: qty 1

## 2019-04-10 MED ORDER — FENTANYL CITRATE (PF) 100 MCG/2ML IJ SOLN
INTRAMUSCULAR | Status: AC
  Filled 2019-04-10: qty 2

## 2019-04-10 NOTE — H&P (Signed)
Chief Complaint: Lymphadenopathy  Referring Physician(s): Russo,John  Supervising Physician: Sandi Mariscal  Patient Status: [facility] - Out-pt  History of Present Illness: Dana [name] is a 68 y.o. female with history of endometrial cancer who developed some right sided back pain.  CT showed= 1. Enlarged right inguinal lymph node or soft tissue mass, measuring 3.8 x 3.5 cm (series 2, image 69). Additional enlarged right iliac lymph node measuring 1.9 x 1.1 cm (series 2, image 68). Findings are concerning for malignancy.  We are asked to perform an image guided biopsy of the right lymph node.  She is NPO. No blood thinners. No nausea/vomiting. No Fever/chills. ROS negative.   Past Medical History:  Diagnosis Date  . Arthritis   . GERD (gastroesophageal reflux disease)   . History of blood transfusion   . Hypothyroidism   . Lymphedema    left leg- ted hose makes it worse  . Thyroid disease   . Uterine cancer [facility])    Endometrial cancer - surgery and chemo    Past Surgical History:  Procedure Laterality Date  . ABDOMINAL HYSTERECTOMY    . APPENDECTOMY    . COLONOSCOPY W/ POLYPECTOMY    . EYE SURGERY Bilateral    cataracts  . LIVER BIOPSY    . mass removed     2015 from pelvis  . TOTAL HIP ARTHROPLASTY Right 07/13/2016   Procedure: RIGHT TOTAL HIP ARTHROPLASTY ANTERIOR APPROACH;  Surgeon: Leandrew Koyanagi, MD;  Location: [place];  Service: Orthopedics;  Laterality: Right;    Allergies: Oxycodone, Garlic, Other, and Penicillins  Medications: Prior to Admission medications   Medication Sig Start Date End Date Taking? Authorizing Provider  amLODipine (NORVASC) 5 MG tablet Take 5 mg by mouth daily.  01/26/19  Yes [provider]  clindamycin (CLEOCIN) 300 MG capsule TAKE 2 TABS 30 MINS BEFORE PROCEDURE 05/11/17   Leandrew Koyanagi, MD  promethazine (PHENERGAN) 25 MG tablet Take 1 tablet (25 mg total) by mouth every 6 (six) hours as needed for nausea. Patient not  taking: Reported on 08/24/2016 07/13/16 03/26/19  Leandrew Koyanagi, MD     Family History  Problem Relation Age of Onset  . Heart disease Mother   . Multiple myeloma Mother     Social History   Socioeconomic History  . Marital status: Single    Spouse name: Not on file  . Number of children: Not on file  . Years of education: Not on file  . Highest education level: Not on file  Occupational History  . Occupation: Armed forces technical officer  Social Needs  . Financial resource strain: Not on file  . Food insecurity    Worry: Not on file    Inability: Not on file  . Transportation needs    Medical: Not on file    Non-medical: Not on file  Tobacco Use  . Smoking status: Never Smoker  . Smokeless tobacco: Never Used  Substance and Sexual Activity  . Alcohol use: No    Alcohol/week: 0.0 standard drinks  . Drug use: No  . Sexual activity: Not on file  Lifestyle  . Physical activity    Days per week: Not on file    Minutes per session: Not on file  . Stress: Not on file  Relationships  . Social Herbalist on phone: Not on file    Gets together: Not on file    Attends religious service: Not on file  Active member of club or organization: Not on file    Attends meetings of clubs or organizations: Not on file    Relationship status: Not on file  Other Topics Concern  . Not on file  Social History Narrative  . Not on file     Review of Systems: A 12 point ROS discussed and pertinent positives are indicated in the HPI above.  All other systems are negative.  Review of Systems  Vital Signs: BP (!) 136/101   Pulse 79   Temp 98.1 F (36.7 C) (Oral)   Resp 16   Ht 5' 2"  (1.575 m)   Wt 80.7 kg   SpO2 100%   BMI 32.56 kg/m   Physical Exam Vitals signs reviewed.  Constitutional:      Appearance: Normal appearance.  HENT:     Head: Normocephalic and atraumatic.  Eyes:     Extraocular Movements: Extraocular movements intact.  Neck:     Musculoskeletal: Normal  range of motion.  Cardiovascular:     Rate and Rhythm: Normal rate and regular rhythm.  Pulmonary:     Effort: Pulmonary effort is normal.     Breath sounds: Normal breath sounds.  Abdominal:     Palpations: Abdomen is soft.     Tenderness: There is no abdominal tenderness.  Musculoskeletal: Normal range of motion.  Skin:    General: Skin is warm and dry.  Neurological:     General: No focal deficit present.     Mental Status: She is alert and oriented to person, place, and time.  Psychiatric:        Mood and Affect: Mood normal.        Behavior: Behavior normal.        Thought Content: Thought content normal.        Judgment: Judgment normal.     Imaging: Ct Abdomen Pelvis W Contrast  Result Date: 03/28/2019 CLINICAL DATA:  Mid CP 8 tenderness, right-sided back pain, query gallbladder pathology or polyp size cysts, history of endometrial cancer, hysterectomy, appendectomy EXAM: CT ABDOMEN AND PELVIS WITH CONTRAST TECHNIQUE: Multidetector CT imaging of the abdomen and pelvis was performed using the standard protocol following bolus administration of intravenous contrast. CONTRAST:  159m ISOVUE-300 IOPAMIDOL (ISOVUE-300) INJECTION 61% COMPARISON:  None. FINDINGS: Lower chest: No acute abnormality.  Small hiatal hernia. Hepatobiliary: There are multiple subcentimeter low-attenuation lesions of the liver, the largest in the left lobe the measuring 7 mm, too small to characterize (series 2, image 24). No gallstones, gallbladder wall thickening, or biliary dilatation. Pancreas: Unremarkable. No pancreatic ductal dilatation or surrounding inflammatory changes. Spleen: Normal in size without significant abnormality. Adrenals/Urinary Tract: Adrenal glands are unremarkable. Kidneys are normal, without renal calculi, solid lesion, or hydronephrosis. Bladder is unremarkable. Stomach/Bowel: Stomach is within normal limits. Appendix is surgically absent. No evidence of bowel wall thickening,  distention, or inflammatory changes. Sigmoid diverticulosis. Vascular/Lymphatic: Aortic atherosclerosis. Enlarged right inguinal lymph node or soft tissue mass, measuring 3.8 x 3.5 cm (series 2, image 69). Additional enlarged right iliac lymph node measuring 1.9 x 1.1 cm (series 2, image 68). Reproductive: Status post hysterectomy. Other: Small fat containing midline ventral hernia (series 2, image 36). No abdominopelvic ascites. Musculoskeletal: No acute or significant osseous findings. Status post right hip total arthroplasty IMPRESSION: 1. Enlarged right inguinal lymph node or soft tissue mass, measuring 3.8 x 3.5 cm (series 2, image 69). Additional enlarged right iliac lymph node measuring 1.9 x 1.1 cm (series 2, image 68). Findings are  concerning for malignancy. 2. Status post hysterectomy per stated history of endometrial malignancy. 3. No other findings in the abdomen or pelvis concerning for malignancy or metastatic disease. 4. There are multiple subcentimeter low-attenuation lesions of the liver, the largest in the left lobe the measuring 7 mm, too small to characterize (series 2, image 24). Attention on follow-up although these are most likely incidental benign small cysts and/or hemangiomata. 5.  Aortic atherosclerosis. Electronically Signed   By: Eddie Candle M.D.   On: 03/28/2019 16:17   Dg Abd 2 Views  Result Date: 03/26/2019 CLINICAL DATA:  68 year old female with history of flank pain and back pain. Right upper quadrant abdominal pain. EXAM: ABDOMEN - 2 VIEW COMPARISON:  No priors. FINDINGS: Gas and stool are seen scattered throughout the colon extending to the level of the distal rectum. No pathologic distension of small bowel is noted. No gross evidence of pneumoperitoneum. Faintly visualized rounded opacity projecting over the lower margin of the right lobe of the liver on the first image measuring 1.3 cm, suspicious for a gallstone. Small calcifications in the pelvis with central lucency, most  compatible with small phleboliths. No definite calcifications projecting over either kidney or the expected course of either ureter. Surgical clips projecting over the mid abdomen. Status post right hip arthroplasty. IMPRESSION: 1. No definite urinary tract calculi confidently identified on today's plain film evaluation. 2. However, there may be a partially calcified gallstone. If there is clinical concern for cholelithiasis, further evaluation with right upper quadrant abdominal ultrasound would be recommended. Electronically Signed   By: Vinnie Langton M.D.   On: 03/26/2019 14:15    Labs:  CBC: Recent Labs    04/10/19 1101  WBC 6.8  HGB 15.3*  HCT 47.0*  PLT 292    COAGS: Recent Labs    04/10/19 1101  INR 1.0    BMP: No results for input(s): NA, K, CL, CO2, GLUCOSE, BUN, CALCIUM, CREATININE, GFRNONAA, GFRAA in the last 8760 hours.  Invalid input(s): CMP  LIVER FUNCTION TESTS: No results for input(s): BILITOT, AST, ALT, ALKPHOS, PROT, ALBUMIN in the last 8760 hours.  TUMOR MARKERS: No results for input(s): AFPTM, CEA, CA199, CHROMGRNA in the last 8760 hours.  Assessment and Plan:  History of endometrial cancer.  Enlarged right inguinal lymph node.  Will proceed with right lymph node biopsy today by Dr. Pascal Lux.  Risks and benefits of lymph node biopsy was discussed with the patient and/or patient's family including, but not limited to bleeding, infection, damage to adjacent structures or low yield requiring additional tests.  All of the questions were answered and there is agreement to proceed.  Consent signed and in chart.  Thank you for this interesting consult.  I greatly enjoyed meeting [facility]RA[facility] and look forward to participating in their care.  A copy of this report was sent to the requesting provider on this date.  Electronically Signed: Murrell Redden, PA-C   04/10/2019, 12:02 PM      I spent a total of  30 Minutes   in face to face in clinical  consultation, greater than 50% of which was counseling/coordinating care for lymph node biopsy.

## 2019-04-10 NOTE — Discharge Instructions (Addendum)
Needle Biopsy, Care After °This sheet gives you information about how to care for yourself after your procedure. Your health care provider may also give you more specific instructions. If you have problems or questions, contact your health care provider. °What can I expect after the procedure? °After the procedure, it is common to have soreness, bruising, or mild pain at the puncture site. This should go away in a few days. °Follow these instructions at home: °Needle insertion site care ° °· Wash your hands with soap and water before you change your bandage (dressing). If you cannot use soap and water, use hand sanitizer. °· Follow instructions from your health care provider about how to take care of your puncture site. This includes: °? When and how to change your dressing. °? When to remove your dressing. °· Check your puncture site every day for signs of infection. Check for: °? Redness, swelling, or pain. °? Fluid or blood. °? Pus or a bad smell. °? Warmth. °General instructions °· Return to your normal activities as told by your health care provider. Ask your health care provider what activities are safe for you. °· Do not take baths, swim, or use a hot tub until your health care provider approves. Ask your health care provider if you may take showers. You may only be allowed to take sponge baths. °· Take over-the-counter and prescription medicines only as told by your health care provider. °· Keep all follow-up visits as told by your health care provider. This is important. °Contact a health care provider if: °· You have a fever. °· You have redness, swelling, or pain at the puncture site that lasts longer than a few days. °· You have fluid, blood, or pus coming from your puncture site. °· Your puncture site feels warm to the touch. °Get help right away if: °· You have severe bleeding from the puncture site. °Summary °· After the procedure, it is common to have soreness, bruising, or mild pain at the puncture  site. This should go away in a few days. °· Check your puncture site every day for signs of infection, such as redness, swelling, or pain. °· Get help right away if you have severe bleeding from your puncture site. °This information is not intended to replace advice given to you by your health care provider. Make sure you discuss any questions you have with your health care provider. °Document Released: 11/17/2014 Document Revised: 09/14/2017 Document Reviewed: 07/16/2017 °Elsevier Patient Education © 2020 [employer] °Moderate Conscious Sedation, Adult, Care After °These instructions provide you with information about caring for yourself after your procedure. Your health care provider may also give you more specific instructions. Your treatment has been planned according to current medical practices, but problems sometimes occur. Call your health care provider if you have any problems or questions after your procedure. °What can I expect after the procedure? °After your procedure, it is common: °· To feel sleepy for several hours. °· To feel clumsy and have poor balance for several hours. °· To have poor judgment for several hours. °· To vomit if you eat too soon. °Follow these instructions at home: °For at least 24 hours after the procedure: ° °· Do not: °? Participate in activities where you could fall or become injured. °? Drive. °? Use heavy machinery. °? Drink alcohol. °? Take sleeping pills or medicines that cause drowsiness. °? Make important decisions or sign legal documents. °? Take care of children on your own. °· Rest. °Eating and   drinking °· Follow the diet recommended by your health care provider. °· If you vomit: °? Drink water, juice, or soup when you can drink without vomiting. °? Make sure you have little or no nausea before eating solid foods. °General instructions °· Have a responsible adult stay with you until you are awake and alert. °· Take over-the-counter and prescription medicines only as  told by your health care provider. °· If you smoke, do not smoke without supervision. °· Keep all follow-up visits as told by your health care provider. This is important. °Contact a health care provider if: °· You keep feeling nauseous or you keep vomiting. °· You feel light-headed. °· You develop a rash. °· You have a fever. °Get help right away if: °· You have trouble breathing. °This information is not intended to replace advice given to you by your health care provider. Make sure you discuss any questions you have with your health care provider. °Document Released: 04/23/2013 Document Revised: 06/15/2017 Document Reviewed: 10/23/2015 °Elsevier Patient Education © 2020 [employer] ° °

## 2019-04-10 NOTE — Procedures (Signed)
Pre Procedure Dx: Right inguinal LAN Post Procedural Dx: Same  Technically successful US guided biopsy of indeterminate right inguinal LN.  EBL: None  No immediate complications.   Ronny Bacon, MD Pager #: [phone]

## 2019-04-14 ENCOUNTER — Telehealth: Payer: Self-pay | Admitting: *Deleted

## 2019-04-14 ENCOUNTER — Other Ambulatory Visit: Payer: Self-pay | Admitting: Internal Medicine

## 2019-04-14 DIAGNOSIS — C55 Malignant neoplasm of uterus, part unspecified: Secondary | ICD-10-CM

## 2019-04-14 DIAGNOSIS — R590 Localized enlarged lymph nodes: Secondary | ICD-10-CM

## 2019-04-14 NOTE — Telephone Encounter (Signed)
Called and spoke with the patient. Scheduled a new patient appt for 10/5

## 2019-04-17 ENCOUNTER — Other Ambulatory Visit: Payer: Self-pay

## 2019-04-21 ENCOUNTER — Encounter: Payer: Self-pay | Admitting: Oncology

## 2019-04-21 ENCOUNTER — Encounter: Payer: Self-pay | Admitting: Gynecologic Oncology

## 2019-04-21 ENCOUNTER — Other Ambulatory Visit: Payer: Self-pay

## 2019-04-21 ENCOUNTER — Telehealth: Payer: Self-pay | Admitting: Oncology

## 2019-04-21 ENCOUNTER — Inpatient Hospital Stay: Payer: 59 | Attending: Gynecologic Oncology | Admitting: Gynecologic Oncology

## 2019-04-21 VITALS — BP 138/90 | HR 80 | Temp 98.1°F | Resp 18 | Ht 61.0 in | Wt 179.4 lb

## 2019-04-21 DIAGNOSIS — Z90710 Acquired absence of both cervix and uterus: Secondary | ICD-10-CM | POA: Insufficient documentation

## 2019-04-21 DIAGNOSIS — Z79899 Other long term (current) drug therapy: Secondary | ICD-10-CM | POA: Insufficient documentation

## 2019-04-21 DIAGNOSIS — E039 Hypothyroidism, unspecified: Secondary | ICD-10-CM | POA: Diagnosis not present

## 2019-04-21 DIAGNOSIS — Z5111 Encounter for antineoplastic chemotherapy: Secondary | ICD-10-CM | POA: Diagnosis not present

## 2019-04-21 DIAGNOSIS — Z90722 Acquired absence of ovaries, bilateral: Secondary | ICD-10-CM | POA: Diagnosis not present

## 2019-04-21 DIAGNOSIS — Z23 Encounter for immunization: Secondary | ICD-10-CM | POA: Insufficient documentation

## 2019-04-21 DIAGNOSIS — C778 Secondary and unspecified malignant neoplasm of lymph nodes of multiple regions: Secondary | ICD-10-CM | POA: Diagnosis not present

## 2019-04-21 DIAGNOSIS — I890 Lymphedema, not elsewhere classified: Secondary | ICD-10-CM | POA: Diagnosis not present

## 2019-04-21 DIAGNOSIS — C55 Malignant neoplasm of uterus, part unspecified: Secondary | ICD-10-CM

## 2019-04-21 DIAGNOSIS — Z8542 Personal history of malignant neoplasm of other parts of uterus: Secondary | ICD-10-CM | POA: Insufficient documentation

## 2019-04-21 DIAGNOSIS — K219 Gastro-esophageal reflux disease without esophagitis: Secondary | ICD-10-CM | POA: Insufficient documentation

## 2019-04-21 DIAGNOSIS — M1990 Unspecified osteoarthritis, unspecified site: Secondary | ICD-10-CM | POA: Diagnosis not present

## 2019-04-21 DIAGNOSIS — C774 Secondary and unspecified malignant neoplasm of inguinal and lower limb lymph nodes: Secondary | ICD-10-CM | POA: Insufficient documentation

## 2019-04-21 NOTE — Progress Notes (Signed)
Met with Festus Aloe after Dr. Serita Grit appointment.  Reviewed plan for chemotherapy and radiation. She was given the [facility] folder and was encouraged to call with any questions.

## 2019-04-21 NOTE — Progress Notes (Signed)
Consult Note: Gyn-Onc  Consult was requested by Dr. Virgina Jock for the evaluation of Dana [name] 68 y.o. female  CC:  Chief Complaint  Patient presents with  . Endometrioid adenocarcinoma    metastatic    Assessment/Plan:  Dana [name]  is a 68 y.o.  year old with recurrent endometrial adenocarcinoma (grade 3) in inguinal and pelvic lymph nodes.  I discussed options with the patient including all potential therapies including surgery, radiation, and chemotherapy.  I discussed the limited role for surgery in the setting of recurrent endometrial cancer, as the standard of care therapy is chemoradiation.  She is never received radiation before and therefore is a candidate for this.  Given that she has extensive lymphedema in her left lower extremity from her original surgical intervention, and given that this is already quality of life limiting, I do not favor surgical intervention with pelvic and inguinal lymphadenectomy on the right as this would need to be accompanied by postoperative radiation and would certainly result in bilateral severe lymphedema which might and her ability to walk and have severe limitations in her quality of life.  Instead we decided the best approach would be to proceed with chemoradiation.  I am recommending consideration for radiation to the groins and pelvis followed by carboplatin paclitaxel chemotherapy.  We will test her tumor for microsatellite instability, ER PR, PDL 1 to evaluate for possible targeted therapeutic options.   HPI: Dana [name] is a 68 year old woman who is seen in consultation at the request of Dr Virgina Jock for recurrent endometrial cancer.  The patient has a history of a 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  The patient's initial surgery was with Dr. Marti [name] in [place] on January 05, 2014.  It included an exploratory laparotomy with TAH, BSO, appendectomy, pelvic  and para-aortic lymphadenectomy and wedge resection of a concerning appearing liver lesion.  Preoperatively she presented with a 26 cm right adnexal mass and an elevated Ca1 25 at 404 units/mL.  At the completion of surgery there was no gross residual disease.  Final pathology showed a primary grade 3 endometrial adenocarcinoma (endometrioid) involving both ovaries.  Lymph nodes and other biopsies were negative.  She had uncomplicated postoperative course.  She was treated with 6 cycles of carboplatin paclitaxel chemotherapy.  She was recommended adjuvant radiation therapy however declined this after counseling.  The patient was concerned about toxicities.  At the completion of therapy her Ca1 25 was normal at 4.1.  And CT scan showed no evidence of residual disease.  Following her initial therapy she developed severe left lower extremity lymphedema.  Follow-up CT scan in March 2016 showed no evidence of disease.  She was followed every 3 months for a total of 5 years with no recurrence appreciated during that time.  In the fall 2020 she began experiencing some right upper quadrant and back pain.  This prompted a series of evaluations including a CT scan of the abdomen and pelvis which was performed on March 28, 2019.  This scan showed enlarged right inguinal lymph node with soft tissue mass measuring 3.8 x 3.5 cm.  Additionally there was an enlarged right iliac lymph node measuring 1.9 x 1.1 cm.  Findings were concerning for malignancy.  There were no other findings in the abdomen and pelvis concerning for malignancy or metastatic disease.  There were benign findings in her liver.  A fine-needle aspirate biopsy was performed  of the right inguinal lymph node on April 10, 2019 and this revealed metastatic adenocarcinoma consistent with endometrioid primary.  The patient works in Contract[employer] for the [employer] in [place].  She stands on her feet for work.  She lives alone.  Current  Meds:  Outpatient Encounter Medications as of 04/21/2019  Medication Sig  . amLODipine (NORVASC) 5 MG tablet Take 5 mg by mouth daily.   Marland Kitchen ibuprofen (ADVIL) 200 MG tablet Take 200 mg by mouth every 6 (six) hours as needed.  . traMADol (ULTRAM) 50 MG tablet TK 1 T PO  BID PRF PAINS.  . [DISCONTINUED] ALPRAZolam (XANAX) 0.5 MG tablet TK 1/2 TO 1 T PO BID PRN  . [DISCONTINUED] clindamycin (CLEOCIN) 300 MG capsule TAKE 2 TABS 30 MINS BEFORE PROCEDURE (Patient not taking: Reported on 04/21/2019)  . [DISCONTINUED] promethazine (PHENERGAN) 25 MG tablet Take 1 tablet (25 mg total) by mouth every 6 (six) hours as needed for nausea. (Patient not taking: Reported on 08/24/2016)   No facility-administered encounter medications on file as of 04/21/2019.     Allergy:  Allergies  Allergen Reactions  . Oxycodone Nausea And Vomiting  . Garlic Diarrhea and Other (See Comments)    Claims must have "organic"  . Other Diarrhea and Other (See Comments)    DAIRY PRODUCTS REDNESS Claims must have "organic"  . Penicillins Rash    Has patient had a PCN reaction causing immediate rash, facial/tongue/throat swelling, SOB or lightheadedness with hypotension: Yes Has patient had a PCN reaction causing severe rash involving mucus membranes or skin necrosis: No Has patient had a PCN reaction that required hospitalization No Has patient had a PCN reaction occurring within the last 10 years: Yes If all of the above answers are "NO", then may proceed with Cephalosporin use.     Social Hx:   Social History   Socioeconomic History  . Marital status: Single    Spouse name: Not on file  . Number of children: Not on file  . Years of education: Not on file  . Highest education level: Not on file  Occupational History  . Occupation: Armed forces technical officer  Social Needs  . Financial resource strain: Not on file  . Food insecurity    Worry: Not on file    Inability: Not on file  . Transportation needs    Medical: Not  on file    Non-medical: Not on file  Tobacco Use  . Smoking status: Never Smoker  . Smokeless tobacco: Never Used  Substance and Sexual Activity  . Alcohol use: No    Alcohol/week: 0.0 standard drinks  . Drug use: No  . Sexual activity: Not on file  Lifestyle  . Physical activity    Days per week: Not on file    Minutes per session: Not on file  . Stress: Not on file  Relationships  . Social Herbalist on phone: Not on file    Gets together: Not on file    Attends religious service: Not on file    Active member of club or organization: Not on file    Attends meetings of clubs or organizations: Not on file    Relationship status: Not on file  . Intimate partner violence    Fear of current or ex partner: Not on file    Emotionally abused: Not on file    Physically abused: Not on file    Forced sexual activity: Not on file  Other  Topics Concern  . Not on file  Social History Narrative  . Not on file    Past Surgical Hx:  Past Surgical History:  Procedure Laterality Date  . ABDOMINAL HYSTERECTOMY    . APPENDECTOMY    . COLONOSCOPY W/ POLYPECTOMY    . EYE SURGERY Bilateral    cataracts  . LIVER BIOPSY    . mass removed     2015 from pelvis  . TOTAL HIP ARTHROPLASTY Right 07/13/2016   Procedure: RIGHT TOTAL HIP ARTHROPLASTY ANTERIOR APPROACH;  Surgeon: Leandrew Koyanagi, MD;  Location: [facility];  Service: Orthopedics;  Laterality: Right;    Past Medical Hx:  Past Medical History:  Diagnosis Date  . Arthritis   . GERD (gastroesophageal reflux disease)   . History of blood transfusion   . Hypothyroidism   . Lymphedema    left leg- ted hose makes it worse  . Thyroid disease   . Uterine cancer [facility])    Endometrial cancer - surgery and chemo    Past Gynecological History:  See HPI No LMP recorded. Patient has had a hysterectomy.  Family Hx:  Family History  Problem Relation Age of Onset  . Heart disease Mother   . Multiple myeloma Mother     Review of  Systems:  Constitutional  Feels well,    ENT Normal appearing ears and nares bilaterally Skin/Breast  No rash, sores, jaundice, itching, dryness Cardiovascular  No chest pain, shortness of breath, or edema  Pulmonary  No cough or wheeze.  Gastro Intestinal  No nausea, vomitting, or diarrhoea. No bright red blood per rectum, no abdominal pain, change in bowel movement, or constipation.  Genito Urinary  No frequency, urgency, dysuria, no bleeding Musculo Skeletal  No myalgia, arthralgia, joint swelling or pain  Neurologic  No weakness, numbness, change in gait,  Psychology  No depression, anxiety, insomnia.   Vitals:  Blood pressure 138/90, pulse 80, temperature 98.1 F (36.7 C), temperature source Oral, resp. rate 18, height 5' 1"  (1.549 m), weight 179 lb 6.4 oz (81.4 kg), SpO2 100 %.  Physical Exam: WD in NAD Neck  Supple NROM, without any enlargements.  Lymph Node Survey + right inguinal mass measuring 5cm, fixed.  Cardiovascular  Pulse normal rate, regularity and rhythm. S1 and S2 normal.  Lungs  Clear to auscultation bilateraly, without wheezes/crackles/rhonchi. Good air movement.  Skin  No rash/lesions/breakdown  Psychiatry  Alert and oriented to person, place, and time  Abdomen  Normoactive bowel sounds, abdomen soft, non-tender and obese without evidence of hernia.  Back No CVA tenderness Genito Urinary  Vulva/vagina: Normal external female genitalia.  No lesions. No discharge or bleeding.  Vaginal cuff smooth and free of lesions. Rectal  deferred Extremities  No bilateral cyanosis, clubbing or edema.   Thereasa Solo, MD  04/21/2019, 6:27 PM

## 2019-04-21 NOTE — Patient Instructions (Signed)
There is a recurrence of the endometrial cancer in the lymph nodes of the right pelvis and right groin.  The treatment for this is a combination of chemotherapy and radiation.  We will first look with a PET scan to see if the cancer has obviously spread to other organs.  We will evaluate the pathology to determine if there are different drugs (other than the medicines you received last time) that might work more specifically on your tumor.  Dr Serita Grit office can be reached at [phone].

## 2019-04-21 NOTE — Telephone Encounter (Signed)
Requested MSI, ER/PR and PD-L1 testing with Amber with [facility]ology on CASE: [id number].

## 2019-04-21 NOTE — Telephone Encounter (Signed)
[facility] and scheduled appointment to see Dr. Alvy Bimler on 04/23/19 at 8 am.  She verbalized understanding and agreement.

## 2019-04-22 ENCOUNTER — Encounter (HOSPITAL_COMMUNITY)
Admission: RE | Admit: 2019-04-22 | Discharge: 2019-04-22 | Disposition: A | Discharge status: Home / Self Care | Payer: 59 | Source: Ambulatory Visit | Attending: Internal Medicine | Admitting: Internal Medicine

## 2019-04-22 DIAGNOSIS — R590 Localized enlarged lymph nodes: Secondary | ICD-10-CM | POA: Diagnosis present

## 2019-04-22 DIAGNOSIS — C55 Malignant neoplasm of uterus, part unspecified: Secondary | ICD-10-CM | POA: Diagnosis not present

## 2019-04-22 LAB — GLUCOSE, CAPILLARY: Glucose-Capillary: 95 mg/dL (ref 70–99)

## 2019-04-22 MED ORDER — FLUDEOXYGLUCOSE F - 18 (FDG) INJECTION
9.6000 | Freq: Once | INTRAVENOUS | Status: AC | PRN
  Administered 2019-04-22: 9.6 via INTRAVENOUS

## 2019-04-23 ENCOUNTER — Encounter: Payer: Self-pay | Admitting: Hematology and Oncology

## 2019-04-23 ENCOUNTER — Inpatient Hospital Stay: Payer: 59 | Admitting: Hematology and Oncology

## 2019-04-23 ENCOUNTER — Other Ambulatory Visit: Payer: Self-pay

## 2019-04-23 VITALS — BP 131/82 | HR 72 | Temp 98.2°F | Resp 18 | Ht 61.0 in | Wt 182.8 lb

## 2019-04-23 DIAGNOSIS — C55 Malignant neoplasm of uterus, part unspecified: Secondary | ICD-10-CM

## 2019-04-23 DIAGNOSIS — Z23 Encounter for immunization: Secondary | ICD-10-CM

## 2019-04-23 DIAGNOSIS — C774 Secondary and unspecified malignant neoplasm of inguinal and lower limb lymph nodes: Secondary | ICD-10-CM

## 2019-04-23 DIAGNOSIS — Z5111 Encounter for antineoplastic chemotherapy: Secondary | ICD-10-CM | POA: Diagnosis not present

## 2019-04-23 MED ORDER — INFLUENZA VAC A&B SA ADJ QUAD 0.5 ML IM PRSY
0.5000 mL | PREFILLED_SYRINGE | Freq: Once | INTRAMUSCULAR | Status: AC
  Administered 2019-04-23: 0.5 mL via INTRAMUSCULAR

## 2019-04-23 MED ORDER — INFLUENZA VAC A&B SA ADJ QUAD 0.5 ML IM PRSY
PREFILLED_SYRINGE | INTRAMUSCULAR | Status: AC
  Filled 2019-04-23: qty 0.5

## 2019-04-24 ENCOUNTER — Telehealth: Payer: Self-pay | Admitting: Hematology and Oncology

## 2019-04-24 LAB — SURGICAL PATHOLOGY

## 2019-04-24 NOTE — Progress Notes (Signed)
[facility] CONSULT NOTE  Patient Care Team: Shon Baton, MD as PCP - General (Internal Medicine)  ASSESSMENT & PLAN:  Endometrioid adenocarcinoma of uterus [facility]) I have reviewed imaging studies and plan of care with the patient The patient has severe lymphedema from prior surgery Currently, she has recurrence of disease in the lymph node in the right groin Surgery is not recommended due to high risk of lymphedema as well She has appointment to see radiation oncologist next week to discuss about the role of radiation therapy If she decides not to pursue radiation treatment, I will prescribe systemic chemotherapy We reviewed the current guidelines Treatment options included carboplatin and Taxol, antiestrogen therapy with everolimus or pembrolizumab with lenvatinib I did not go into great details about each option If she decides to pursue radiation treatment, I will make return appointment to see her back at the end of radiation therapy to start treatment in a palliative fashion after that I have addressed all her questions  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[place]) She has minimum discomfort on the right groin She has prescription pain medicine to take as needed I recommend her to take analgesics as needed   No orders of the defined types were placed in this encounter.    CHIEF COMPLAINTS/PURPOSE OF CONSULTATION:  Recurrent endometrial cancer, for further management  HISTORY OF PRESENTING ILLNESS:  Dana [name] 68 y.o. female is here because of recent findings of recurrent endometrial cancer I have reviewed some electronic records Back in 2015, she presented with symptoms of bloating, postmenopausal bleeding and nausea She was subsequently diagnosed with uterine cancer and underwent surgery followed by chemotherapy She did not receive adjuvant radiation treatment She has regular follow-up  From her original treatment, she developed severe  lymphedema on the left leg.  She denies peripheral neuropathy or infectious complications Several months ago, she palpated a lump in the right groin with associated right groin pain She also have occasional pressure sensation in the region radiating to her back She underwent further evaluation and imaging study which show abnormalities She underwent biopsy and was referred here for further management  I have reviewed her chart and materials related to her cancer extensively and collaborated history with the patient. 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  The patient's initial surgery was with Dr. Marti Sleigh in [place] on January 05, 2014.  It included an exploratory laparotomy with TAH, BSO, appendectomy, pelvic and para-aortic lymphadenectomy and wedge resection of a concerning appearing liver lesion.  Preoperatively she presented with a 26 cm right adnexal mass and an elevated Ca1 25 at 404 units/mL.  At the completion of surgery there was no gross residual disease.  Final pathology showed a primary grade 3 endometrial adenocarcinoma (endometrioid) involving both ovaries.  Lymph nodes and other biopsies were negative.  She had uncomplicated postoperative course.  She was treated with 6 cycles of carboplatin paclitaxel chemotherapy.  She was recommended adjuvant radiation therapy however declined this after counseling.  Following her initial therapy she developed severe left lower extremity lymphedema.   Follow-up CT scan in March 2016 showed no evidence of disease.  She was followed every 3 months for a total of 5 years with no recurrence appreciated during that time   03/28/2019  Imaging   Ct abdomen and pelvis 1. Enlarged right inguinal lymph node or soft tissue  mass, measuring 3.8 x 3.5 cm (series 2, image 69). Additional enlarged right iliac lymph node measuring 1.9 x 1.1 cm (series 2, image 68). Findings are concerning for malignancy.   2. Status post hysterectomy per stated history of endometrial malignancy.   3. No other findings in the abdomen or pelvis concerning for malignancy or metastatic disease.   4. There are multiple subcentimeter low-attenuation lesions of the liver, the largest in the left lobe the measuring 7 mm, too small to characterize (series 2, image 24). Attention on follow-up although these are most likely incidental benign small cysts and/or hemangiomata.   5.  Aortic atherosclerosis.   04/10/2019 Pathology Results   A. LYMPH NODE, RIGHT INGUINAL, NEEDLE CORE BIOPSY:  - Metastatic adenocarcinoma.  - See comment.   COMMENT:   The morphology is most consistent with metastatic endometrioid adenocarcinoma.  Immunohistochemistry can be performed if clinically indicated.  There is sufficient tissue for additional testing   04/10/2019 Procedure   Technically successful ultrasound guided biopsy of dominant right inguinal lymph node   04/22/2019 Cancer Staging   Staging form: Corpus Uteri - Carcinoma and Carcinosarcoma, AJCC 8th Edition - Pathologic stage from 04/22/2019: FIGO Stage IVB (rpT3a, pN0, pM1) - Signed by [name], MD on 04/22/2019   04/22/2019 PET scan   1. Two hypermetabolic RIGHT iliac lymph nodes and larger metastatic RIGHT inguinal lymph node. 2. No evidence of metastatic disease outside the pelvis. 3. No clear evidence of activity at the vaginal cuff.   She is currently active She continues to work She denies abnormal weight loss but have reduced appetite  MEDICAL HISTORY:  Past Medical History:  Diagnosis Date  . Arthritis   . GERD (gastroesophageal reflux disease)   . History of blood transfusion   . Hypothyroidism   . Lymphedema    left leg- ted hose makes it worse  . Thyroid disease   . Uterine  cancer [facility])    Endometrial cancer - surgery and chemo    SURGICAL HISTORY: Past Surgical History:  Procedure Laterality Date  . ABDOMINAL HYSTERECTOMY    . APPENDECTOMY    . COLONOSCOPY W/ POLYPECTOMY    . EYE SURGERY Bilateral    cataracts  . LIVER BIOPSY    . mass removed     2015 from pelvis  . TOTAL HIP ARTHROPLASTY Right 07/13/2016   Procedure: RIGHT TOTAL HIP ARTHROPLASTY ANTERIOR APPROACH;  Surgeon: Leandrew Koyanagi, MD;  Location: [facility];  Service: Orthopedics;  Laterality: Right;    SOCIAL HISTORY: Social History   Socioeconomic History  . Marital status: Single    Spouse name: Not on file  . Number of children: Not on file  . Years of education: Not on file  . Highest education level: Not on file  Occupational History  . Occupation: Armed forces technical officer  Social Needs  . Financial resource strain: Not on file  . Food insecurity    Worry: Not on file    Inability: Not on file  . Transportation needs    Medical: Not on file    Non-medical: Not on file  Tobacco Use  . Smoking status: Never Smoker  . Smokeless tobacco: Never Used  Substance and Sexual Activity  . Alcohol use: No    Alcohol/week: 0.0 standard drinks  . Drug use: No  . Sexual activity: Not on file  Lifestyle  . Physical activity  Days per week: Not on file    Minutes per session: Not on file  . Stress: Not on file  Relationships  . Social Herbalist on phone: Not on file    Gets together: Not on file    Attends religious service: Not on file    Active member of club or organization: Not on file    Attends meetings of clubs or organizations: Not on file    Relationship status: Not on file  . Intimate partner violence    Fear of current or ex partner: Not on file    Emotionally abused: Not on file    Physically abused: Not on file    Forced sexual activity: Not on file  Other Topics Concern  . Not on file  Social History Narrative  . Not on file    FAMILY  HISTORY: Family History  Problem Relation Age of Onset  . Heart disease Mother   . Multiple myeloma Mother     ALLERGIES:  is allergic to oxycodone; garlic; other; and penicillins.  MEDICATIONS:  Current Outpatient Medications  Medication Sig Dispense Refill  . amLODipine (NORVASC) 5 MG tablet Take 5 mg by mouth daily.     Marland Kitchen ibuprofen (ADVIL) 200 MG tablet Take 200 mg by mouth every 6 (six) hours as needed.    . traMADol (ULTRAM) 50 MG tablet TK 1 T PO  BID PRF PAINS.     No current facility-administered medications for this visit.     REVIEW OF SYSTEMS:   Constitutional: Denies fevers, chills or abnormal night sweats Eyes: Denies blurriness of vision, double vision or watery eyes Ears, nose, mouth, throat, and face: Denies mucositis or sore throat Respiratory: Denies cough, dyspnea or wheezes Cardiovascular: Denies palpitation, chest discomfort  Gastrointestinal:  Denies nausea, heartburn or change in bowel habits Skin: Denies abnormal skin rashes Neurological:Denies numbness, tingling or new weaknesses Behavioral/Psych: Mood is stable, no new changes  All other systems were reviewed with the patient and are negative.  PHYSICAL EXAMINATION: ECOG PERFORMANCE STATUS: 1 - Symptomatic but completely ambulatory  Vitals:   04/23/19 0816  BP: 131/82  Pulse: 72  Resp: 18  Temp: 98.2 F (36.8 C)  SpO2: 100%   Filed Weights   04/23/19 0816  Weight: 182 lb 12.8 oz (82.9 kg)    GENERAL:alert, no distress and comfortable SKIN: skin color, texture, turgor are normal, no rashes or significant lesions EYES: normal, conjunctiva are pink and non-injected, sclera clear OROPHARYNX:no exudate, no erythema and lips, buccal mucosa, and tongue normal  NECK: supple, thyroid normal size, non-tender, without nodularity LYMPH: She has palpable lymph node on the right inguinal region LUNGS: clear to auscultation and percussion with normal breathing effort HEART: regular rate & rhythm and  no murmurs with significant bilateral lower extremity edema, left greater than the right ABDOMEN:abdomen soft, non-tender and normal bowel sounds Musculoskeletal:no cyanosis of digits and no clubbing  PSYCH: alert & oriented x 3 with fluent speech NEURO: no focal motor/sensory deficits  LABORATORY DATA:  I have reviewed the data as listed Lab Results  Component Value Date   WBC 6.8 04/10/2019   HGB 15.3 (H) 04/10/2019   HCT 47.0 (H) 04/10/2019   MCV 93.6 04/10/2019   PLT 292 04/10/2019   No results for input(s): NA, K, CL, CO2, GLUCOSE, BUN, CREATININE, CALCIUM, GFRNONAA, GFRAA, PROT, ALBUMIN, AST, ALT, ALKPHOS, BILITOT, BILIDIR, IBILI in the last 8760 hours.  RADIOGRAPHIC STUDIES: I have reviewed multiple imaging studies  with the patient I have personally reviewed the radiological images as listed and agreed with the findings in the report. Ct Abdomen Pelvis W Contrast  Result Date: 03/28/2019 CLINICAL DATA:  Mid CP 8 tenderness, right-sided back pain, query gallbladder pathology or polyp size cysts, history of endometrial cancer, hysterectomy, appendectomy EXAM: CT ABDOMEN AND PELVIS WITH CONTRAST TECHNIQUE: Multidetector CT imaging of the abdomen and pelvis was performed using the standard protocol following bolus administration of intravenous contrast. CONTRAST:  182m ISOVUE-300 IOPAMIDOL (ISOVUE-300) INJECTION 61% COMPARISON:  None. FINDINGS: Lower chest: No acute abnormality.  Small hiatal hernia. Hepatobiliary: There are multiple subcentimeter low-attenuation lesions of the liver, the largest in the left lobe the measuring 7 mm, too small to characterize (series 2, image 24). No gallstones, gallbladder wall thickening, or biliary dilatation. Pancreas: Unremarkable. No pancreatic ductal dilatation or surrounding inflammatory changes. Spleen: Normal in size without significant abnormality. Adrenals/Urinary Tract: Adrenal glands are unremarkable. Kidneys are normal, without renal calculi,  solid lesion, or hydronephrosis. Bladder is unremarkable. Stomach/Bowel: Stomach is within normal limits. Appendix is surgically absent. No evidence of bowel wall thickening, distention, or inflammatory changes. Sigmoid diverticulosis. Vascular/Lymphatic: Aortic atherosclerosis. Enlarged right inguinal lymph node or soft tissue mass, measuring 3.8 x 3.5 cm (series 2, image 69). Additional enlarged right iliac lymph node measuring 1.9 x 1.1 cm (series 2, image 68). Reproductive: Status post hysterectomy. Other: Small fat containing midline ventral hernia (series 2, image 36). No abdominopelvic ascites. Musculoskeletal: No acute or significant osseous findings. Status post right hip total arthroplasty IMPRESSION: 1. Enlarged right inguinal lymph node or soft tissue mass, measuring 3.8 x 3.5 cm (series 2, image 69). Additional enlarged right iliac lymph node measuring 1.9 x 1.1 cm (series 2, image 68). Findings are concerning for malignancy. 2. Status post hysterectomy per stated history of endometrial malignancy. 3. No other findings in the abdomen or pelvis concerning for malignancy or metastatic disease. 4. There are multiple subcentimeter low-attenuation lesions of the liver, the largest in the left lobe the measuring 7 mm, too small to characterize (series 2, image 24). Attention on follow-up although these are most likely incidental benign small cysts and/or hemangiomata. 5.  Aortic atherosclerosis. Electronically Signed   By: AEddie CandleM.D.   On: 03/28/2019 16:17   Nm Pet Image Initial (pi) Skull Base To Thigh  Result Date: 04/22/2019 CLINICAL DATA:  Initial treatment strategy for uterine carcinoma. EXAM: NUCLEAR MEDICINE PET SKULL BASE TO THIGH TECHNIQUE: 9.6 mCi F-18 FDG was injected intravenously. Full-ring PET imaging was performed from the skull base to thigh after the radiotracer. CT data was obtained and used for attenuation correction and anatomic localization. Fasting blood glucose: 96 mg/dl  COMPARISON:  6 FINDINGS: Mediastinal blood pool activity: SUV max 2.79 Liver activity: SUV max NA NECK: No hypermetabolic lymph nodes in the neck. Incidental CT findings: none CHEST: No hypermetabolic mediastinal or hilar nodes. No suspicious pulmonary nodules on the CT scan. Incidental CT findings: none ABDOMEN/PELVIS: Post hysterectomy anatomy. Two small intensely hypermetabolic nodes in the RIGHT pelvis. Node in the deep perirectal fat along the piriformis muscle measures 9 mm (image 166/4) on the RIGHT with SUV max equal 12.7. Small RIGHT external iliac lymph node measuring 8 mm (image 161/4) with SUV max equal 9.2. Larger lobular node in the RIGHT inguinal nodal station measuring 3.6 x 3.4 cm SUV max equal 9.2. Focus of intenseradiotracer activity in the RIGHT operator space is favored an extension of the bladder. No evidence of metastatic adenopathy outside the  pelvis. No abnormal activity in liver. Incidental CT findings: none SKELETON: No focal hypermetabolic activity to suggest skeletal metastasis. Incidental CT findings: none IMPRESSION: 1. Two hypermetabolic RIGHT iliac lymph nodes and larger metastatic RIGHT inguinal lymph node. 2. No evidence of metastatic disease outside the pelvis. 3. No clear evidence of activity at the vaginal cuff. Electronically Signed   By: Suzy Bouchard M.D.   On: 04/22/2019 16:22   Dg Abd 2 Views  Result Date: 03/26/2019 CLINICAL DATA:  68 year old female with history of flank pain and back pain. Right upper quadrant abdominal pain. EXAM: ABDOMEN - 2 VIEW COMPARISON:  No priors. FINDINGS: Gas and stool are seen scattered throughout the colon extending to the level of the distal rectum. No pathologic distension of small bowel is noted. No gross evidence of pneumoperitoneum. Faintly visualized rounded opacity projecting over the lower margin of the right lobe of the liver on the first image measuring 1.3 cm, suspicious for a gallstone. Small calcifications in the pelvis  with central lucency, most compatible with small phleboliths. No definite calcifications projecting over either kidney or the expected course of either ureter. Surgical clips projecting over the mid abdomen. Status post right hip arthroplasty. IMPRESSION: 1. No definite urinary tract calculi confidently identified on today's plain film evaluation. 2. However, there may be a partially calcified gallstone. If there is clinical concern for cholelithiasis, further evaluation with right upper quadrant abdominal ultrasound would be recommended. Electronically Signed   By: Vinnie Langton M.D.   On: 03/26/2019 14:15   [place] Core Biopsy (lymph Nodes)  Result Date: 04/10/2019 INDICATION: History of endometrial cancer, now with indeterminate right inguinal lymphadenopathy. Please perform ultrasound-guided right inguinal lymph node biopsy for tissue diagnostic purposes. EXAM: ULTRASOUND-GUIDED RIGHT INGUINAL LYMPH NODE BIOPSY COMPARISON:  CT abdomen and pelvis - 03/28/2019 MEDICATIONS: None ANESTHESIA/SEDATION: Moderate (conscious) sedation was employed during this procedure. A total of Fentanyl 50 mcg was administered intravenously. Moderate Sedation Time: 10 minutes. The patient's level of consciousness and vital signs were monitored continuously by radiology nursing throughout the procedure under my direct supervision. COMPLICATIONS: None immediate. TECHNIQUE: Informed written consent was obtained from the patient after a discussion of the risks, benefits and alternatives to treatment. Questions regarding the procedure were encouraged and answered. Initial ultrasound scanning demonstrated 2 adjacent malignant appearing right inguinal lymph nodes with dominant inguinal lymph node measuring 3.6 x 3.1 cm (image 3). An ultrasound image was saved for documentation purposes. The procedure was planned. A timeout was performed prior to the initiation of the procedure. The operative was prepped and draped in the usual sterile  fashion, and a sterile drape was applied covering the operative field. A timeout was performed prior to the initiation of the procedure. Local anesthesia was provided with 1% lidocaine with epinephrine. Under direct ultrasound guidance, an 18 gauge core needle device was utilized to obtain to obtain 6 core needle biopsies of the dominant right inguinal lymph node. The samples were placed in saline and submitted to pathology. The needle was removed and hemostasis was achieved with manual compression. Post procedure scan was negative for significant hematoma. A dressing was placed. The patient tolerated the procedure well without immediate postprocedural complication. IMPRESSION: Technically successful ultrasound guided biopsy of dominant right inguinal lymph node. Electronically Signed   By: Sandi Mariscal M.D.   On: 04/10/2019 15:47    I spent 60 minutes counseling the patient face to face. The total time spent in the appointment was 80 minutes and more than 50% was  on counseling.  All questions were answered. The patient knows to call the clinic with any problems, questions or concerns.  [name], MD 04/24/2019 10:43 AM

## 2019-04-24 NOTE — Telephone Encounter (Signed)
No 10/7 los.   

## 2019-04-24 NOTE — Assessment & Plan Note (Signed)
She has minimum discomfort on the right groin She has prescription pain medicine to take as needed I recommend her to take analgesics as needed

## 2019-04-24 NOTE — Assessment & Plan Note (Signed)
I have reviewed imaging studies and plan of care with the patient The patient has severe lymphedema from prior surgery Currently, she has recurrence of disease in the lymph node in the right groin Surgery is not recommended due to high risk of lymphedema as well She has appointment to see radiation oncologist next week to discuss about the role of radiation therapy If she decides not to pursue radiation treatment, I will prescribe systemic chemotherapy We reviewed the current guidelines Treatment options included carboplatin and Taxol, antiestrogen therapy with everolimus or pembrolizumab with lenvatinib I did not go into great details about each option If she decides to pursue radiation treatment, I will make return appointment to see her back at the end of radiation therapy to start treatment in a palliative fashion after that I have addressed all her questions

## 2019-04-25 NOTE — Progress Notes (Signed)
GYN Location of Tumor / Histology: recurrent endometrial adenocarcinoma (grade 3) in inguinal and pelvic lymph nodes.  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   In the fall 2020 she began experiencing some right upper quadrant and back pain.  This prompted a series of evaluations including a CT scan of the abdomen and pelvis which was performed on March 28, 2019.  This scan showed enlarged right inguinal lymph node with soft tissue mass measuring 3.8 x 3.5 cm.  Additionally there was an enlarged right iliac lymph node measuring 1.9 x 1.1 cm.  Findings were concerning for malignancy.  There were no other findings in the abdomen and pelvis concerning for malignancy or metastatic disease.  There were benign findings in her liver.  A fine-needle aspirate biopsy was performed of the right inguinal lymph node on April 10, 2019 and this revealed metastatic adenocarcinoma consistent with endometrioid primary.  Biopsies revealed: 04/10/19:  SURGICAL PATHOLOGY   CASE: [id number]  PATIENT: Dana [name]  Surgical Pathology Report   Clinical History: History of endometrial cancer, now with right inguinal  lymphadenopathy worrisome for mets (cm)   DIAGNOSIS:   A. LYMPH NODE, RIGHT INGUINAL, NEEDLE CORE BIOPSY:  - Metastatic adenocarcinoma.  - See comment.   COMMENT:   The morphology is most consistent with metastatic endometrioid  adenocarcinoma.  Estrogen Receptor:    POSITIVE, 90%, STRONG STAINING  Progesterone Receptor:  POSITIVE, 95% STRONG STAINING  Past/Anticipated interventions by Gyn/Onc surgery, if any: The patient's initial surgery was with Dr. Marti Sleigh in [place] on January 05, 2014.  It included an exploratory laparotomy with TAH, BSO, appendectomy, pelvic and para-aortic lymphadenectomy and wedge resection of a concerning appearing  liver lesion.  Preoperatively she presented with a 26 cm right adnexal mass and an elevated Ca1 25 at 404 units/mL.  At the completion of surgery there was no gross residual disease.  Final pathology showed a primary grade 3 endometrial adenocarcinoma (endometrioid) involving both ovaries.  Lymph nodes and other biopsies were negative.  She had uncomplicated postoperative course.  She was treated with 6 cycles of carboplatin paclitaxel chemotherapy.  She was recommended adjuvant radiation therapy however declined this after counseling.  The patient was concerned about toxicities.  At the completion of therapy her Ca1 25 was normal at 4.1.  And CT scan showed no evidence of residual disease.  Following her initial therapy she developed severe left lower extremity lymphedema.  Follow-up CT scan in March 2016 showed no evidence of disease.  She was followed every 3 months for a total of 5 years with no recurrence appreciated during that time.   Past/Anticipated interventions by medical oncology, if any: Per Dr. Alvy Bimler 04/23/19: ASSESSMENT & PLAN:  Endometrioid adenocarcinoma of uterus ([place]) I have reviewed imaging studies and plan of care with the patient The patient has severe lymphedema from prior surgery Currently, she has recurrence of disease in the lymph node in the right groin Surgery is not recommended due to high risk of lymphedema as well She has appointment to see radiation oncologist next week to discuss about the role of radiation therapy If she decides not to pursue radiation treatment, I will prescribe systemic chemotherapy We reviewed the current guidelines Treatment options included carboplatin and Taxol, antiestrogen therapy with everolimus or pembrolizumab with lenvatinib I did not go into great details about each option If she  decides to pursue radiation treatment, I will make return appointment to see her back at the end of radiation therapy to start treatment in a palliative  fashion after that I have addressed all her questions  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[place]) She has minimum discomfort on the right groin She has prescription pain medicine to take as needed I recommend her to take analgesics as needed  Weight changes, if any:  Wt Readings from Last 3 Encounters:  04/28/19 179 lb 8 oz (81.4 kg)  04/23/19 182 lb 12.8 oz (82.9 kg)  04/21/19 179 lb 6.4 oz (81.4 kg)     Bowel/Bladder complaints, if any: Pt denies dysuria/hematuria. Pt denies vaginal bleeding/discharge. Pt reports frequent diarrhea, takes Pepto Bismol with relief.  Nausea/Vomiting, if any: Pt reports Dr. Alvy Bimler thinks pt has abdominal bloating. Pt currently nauseated, no vomiting.  Pain issues, if any:  Pt reports "a twinge" LEFT lower rib cage. Pt states "its like a spasm and then it goes away". Rates 2/10 on pain scale.  SAFETY ISSUES:  Prior radiation? No  Pacemaker/ICD? No  Possible current pregnancy? No  Is the patient on methotrexate? No  Current Complaints / other details:  Pt presents today for initial consult with Dr. Sondra Come for Radiation Oncology. Pt is anxious.   BP (!) 147/89 (BP Location: Left Arm, Patient Position: Sitting)   Pulse 70   Temp 98 F (36.7 C) (Temporal)   Resp 18   Ht 5\' 1"  (1.549 m)   Wt 179 lb 8 oz (81.4 kg)   SpO2 99%   BMI 33.92 kg/m   Loma Sousa, RN BSN

## 2019-04-28 ENCOUNTER — Other Ambulatory Visit: Payer: Self-pay

## 2019-04-28 ENCOUNTER — Ambulatory Visit
Admission: RE | Admit: 2019-04-28 | Discharge: 2019-04-28 | Disposition: A | Discharge status: Home / Self Care | Payer: 59 | Source: Ambulatory Visit | Attending: Radiation Oncology | Admitting: Radiation Oncology

## 2019-04-28 ENCOUNTER — Encounter: Payer: Self-pay | Admitting: Radiation Oncology

## 2019-04-28 ENCOUNTER — Other Ambulatory Visit: Payer: Self-pay | Admitting: Oncology

## 2019-04-28 VITALS — BP 147/89 | HR 70 | Temp 98.0°F | Resp 18 | Ht 61.0 in | Wt 179.5 lb

## 2019-04-28 DIAGNOSIS — Z79899 Other long term (current) drug therapy: Secondary | ICD-10-CM | POA: Diagnosis not present

## 2019-04-28 DIAGNOSIS — C774 Secondary and unspecified malignant neoplasm of inguinal and lower limb lymph nodes: Secondary | ICD-10-CM | POA: Diagnosis not present

## 2019-04-28 DIAGNOSIS — Z90710 Acquired absence of both cervix and uterus: Secondary | ICD-10-CM | POA: Diagnosis not present

## 2019-04-28 DIAGNOSIS — K219 Gastro-esophageal reflux disease without esophagitis: Secondary | ICD-10-CM | POA: Diagnosis not present

## 2019-04-28 DIAGNOSIS — M129 Arthropathy, unspecified: Secondary | ICD-10-CM | POA: Insufficient documentation

## 2019-04-28 DIAGNOSIS — Z90722 Acquired absence of ovaries, bilateral: Secondary | ICD-10-CM | POA: Diagnosis not present

## 2019-04-28 DIAGNOSIS — E039 Hypothyroidism, unspecified: Secondary | ICD-10-CM | POA: Diagnosis not present

## 2019-04-28 DIAGNOSIS — C55 Malignant neoplasm of uterus, part unspecified: Secondary | ICD-10-CM | POA: Insufficient documentation

## 2019-04-28 DIAGNOSIS — R609 Edema, unspecified: Secondary | ICD-10-CM | POA: Insufficient documentation

## 2019-04-28 NOTE — Progress Notes (Signed)
Gynecologic Oncology Multi-Disciplinary Disposition Conference Note  Date of the Conference: 04/28/2019  Patient Name: Dana [name]  Primary GYN Oncologist: Dr. Denman George  Stage/Disposition:  Stage IIIa grade 3 recurrent endometrial adenocarcinoma. Disposition is to radiation to the groin/pelvis +/- cisplatin chemotherapy followed by carboplatin paclitaxel chemotherapy.   This Multidisciplinary conference took place involving physicians from [place], [place], Radiation Oncology, Pathology, Radiology along with the Gynecologic Oncology Nurse Practitioner and RN.  Comprehensive assessment of the patient's malignancy, staging, need for surgery, chemotherapy, radiation therapy, and need for further testing were reviewed. Supportive measures, both inpatient and following discharge were also discussed. The recommended plan of care is documented. Greater than 35 minutes were spent correlating and coordinating this patient's care.

## 2019-04-28 NOTE — Progress Notes (Signed)
Radiation Oncology         [phone] ________________________________  Initial Outpatient Consultation  Name: Dana [name] MRN: [id number]  Date: 04/28/2019  DOB: Mar 27, 1951  QK:MMNOT, Jenny Reichmann, MD  Everitt Amber, MD   REFERRING PHYSICIAN: Everitt Amber, MD  DIAGNOSIS: The primary encounter diagnosis was Endometrioid adenocarcinoma of uterus [facility]). A diagnosis of 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[place]) was also pertinent to this visit.   Recurrent endometrioid Adenocarcinoma  HISTORY OF PRESENT ILLNESS::Dana [name] is a 68 y.o. female who is accompanied by no one due to COVID-19 restrictions. The patient was initially diagnosed with stage IIIa, FIGO grade 3 endometrial cancer in 2015. She was found to have a 26 cm right adnexal mass and underwent hysterectomy with BSO, pelvic and para-aortic lymphadenectomy, and wedge resection of a liver lesion on 01/05/2014.  Surgery was performed at [facility] by Dr. Fermin Schwab.  final pathology showed a primary grade 3 endometrial adenocarcinoma involving both ovaries and negative lymph nodes. She was treated with 6 cycles of chemotherapy consisting of carboplatin and taxol under the care of Dr. Fermin Schwab. She was recommended to undergo radiation therapy but declined at that time. Follow up CT scan in 09/2014 showed no evidence of disease.  On 03/26/2019, she presented with right-sided thoracic back and flank pain. Abdomen x-ray performed at that time was indeterminate. She proceeded to abdomen/pelvis CT on 03/28/2019, which showed: enlarged right inguinal lymph node or soft tissue mass measuring 3.8 cm; additional enlarged right iliac lymph node measuring 1.9 cm; no other findings concerning for malignancy or metastatic disease; likely benign liver cysts.  The patient underwent a biopsy on 04/10/2019 showing: metastatic endometrioid adenocarcinoma; ER 90% positive, PR 95% positive.  She was referred to Dr. Denman George on 04/21/2019,  who recommended chemoradiation instead of surgical intervention given her severe problems with lymphedema along the left lower extremity. She ordered a PET scan.  Performed on 04/22/2019, PET scan showed: two hypermetabolic right iliac lymph nodes and larger metastatic right inguinal lymph node; no evidence of metastatic disease outside the pelvis; no clear evidence of activity at the vaginal cuff.  She met with Dr. Alvy Bimler on 04/24/2019, who recommended systemic chemotherapy or radiation treatment followed by full dose chemotherapy.  PREVIOUS RADIATION THERAPY: No  PAST MEDICAL HISTORY:  Past Medical History:  Diagnosis Date   Arthritis    GERD (gastroesophageal reflux disease)    History of blood transfusion    Hypothyroidism    Lymphedema    left leg- ted hose makes it worse   Thyroid disease    Uterine cancer ([place])    Endometrial cancer - surgery and chemo    PAST SURGICAL HISTORY: Past Surgical History:  Procedure Laterality Date   ABDOMINAL HYSTERECTOMY     APPENDECTOMY     COLONOSCOPY W/ POLYPECTOMY     EYE SURGERY Bilateral    cataracts   LIVER BIOPSY     mass removed     2015 from pelvis   TOTAL HIP ARTHROPLASTY Right 07/13/2016   Procedure: RIGHT TOTAL HIP ARTHROPLASTY ANTERIOR APPROACH;  Surgeon: Leandrew Koyanagi, MD;  Location: [facility];  Service: Orthopedics;  Laterality: Right;    FAMILY HISTORY:  Family History  Problem Relation Age of Onset   Heart disease Mother    Multiple myeloma Mother     SOCIAL HISTORY:  Social History   Tobacco Use   Smoking status: Never Smoker   Smokeless tobacco: Never Used  Substance Use Topics  Alcohol use: No    Alcohol/week: 0.0 standard drinks   Drug use: No    ALLERGIES:  Allergies  Allergen Reactions   Oxycodone Nausea And Vomiting   Garlic Diarrhea and Other (See Comments)    Claims must have "organic"   Other Diarrhea and Other (See Comments)    DAIRY PRODUCTS REDNESS Claims must have  "organic"   Penicillins Rash    Has patient had a PCN reaction causing immediate rash, facial/tongue/throat swelling, SOB or lightheadedness with hypotension: Yes Has patient had a PCN reaction causing severe rash involving mucus membranes or skin necrosis: No Has patient had a PCN reaction that required hospitalization No Has patient had a PCN reaction occurring within the last 10 years: Yes If all of the above answers are "NO", then may proceed with Cephalosporin use.     MEDICATIONS:  Current Outpatient Medications  Medication Sig Dispense Refill   amLODipine (NORVASC) 5 MG tablet Take 5 mg by mouth daily.      ibuprofen (ADVIL) 200 MG tablet Take 200 mg by mouth every 6 (six) hours as needed.     traMADol (ULTRAM) 50 MG tablet TK 1 T PO  BID PRF PAINS.     No current facility-administered medications for this encounter.     REVIEW OF SYSTEMS:  A 10+ POINT REVIEW OF SYSTEMS WAS OBTAINED including neurology, dermatology, psychiatry, cardiac, respiratory, lymph, extremities, GI, GU, musculoskeletal, constitutional, reproductive, HEENT. Is without complaints today. She denies dysuria or hematuria, vaginal discharge or bleeding, constipation, and vomiting. She reports frequent diarrhea, for which she uses pepto bismol with relief, and current nausea. She states Dr. Alvy Bimler thinks she has abdominal bloating. She also reports a "twinge" of pain to her lower left rib cage, stating "it's like a spasm and then it goes away," which she rates 2/10.   PHYSICAL EXAM:  height is 5' 1"  (1.549 m) and weight is 179 lb 8 oz (81.4 kg). Her temporal temperature is 98 F (36.7 C). Her blood pressure is 147/89 (abnormal) and her pulse is 70. Her respiration is 18 and oxygen saturation is 99%.   General: Alert and oriented, in no acute distress HEENT: Head is normocephalic. Extraocular movements are intact. Oropharynx is clear. Neck: Neck is supple, no palpable cervical or supraclavicular  lymphadenopathy. Heart: Regular in rate and rhythm with no murmurs, rubs, or gallops. Chest: Clear to auscultation bilaterally, with no rhonchi, wheezes, or rales. Abdomen: Soft, nontender, nondistended, with no rigidity or guarding. Extremities: No cyanosis or edema. Lymphatics: see Neck Exam Skin: No concerning lesions. Musculoskeletal: symmetric strength and muscle tone throughout. Neurologic: Cranial nerves II through XII are grossly intact. No obvious focalities. Speech is fluent. Coordination is intact. Psychiatric: Judgment and insight are intact. Affect is appropriate. Pelvic exam the external genitalia unremarkable.  A speculum was performed.  No mucosal lesions noted in the vaginal vault.  On bimanual examination no pelvic masses appreciated. The left inguinal area is free of palpable adenopathy.  The right inguinal area shows a large node in the high right inguinal area, possibly fixed to surrounding structures.  This is estimated to be at least 4 x 4.5 cm.  Patient is noted to have significant edema in her left lower extremity, pitting and tense.  No drainage noted at this time.  ECOG = 1  0 - Asymptomatic (Fully active, able to carry on all predisease activities without restriction)  1 - Symptomatic but completely ambulatory (Restricted in physically strenuous activity but ambulatory and able  to carry out work of a light or sedentary nature. For example, light housework, office work)  2 - Symptomatic, <50% in bed during the day (Ambulatory and capable of all self care but unable to carry out any work activities. Up and about more than 50% of waking hours)  3 - Symptomatic, >50% in bed, but not bedbound (Capable of only limited self-care, confined to bed or chair 50% or more of waking hours)  4 - Bedbound (Completely disabled. Cannot carry on any self-care. Totally confined to bed or chair)  5 - Death   Eustace Pen MM, Creech RH, Tormey DC, et al. [phone]). "Toxicity and response  criteria of the [facility]". [place] Oncol. 5 (6): 649-55  LABORATORY DATA:  Lab Results  Component Value Date   WBC 6.8 04/10/2019   HGB 15.3 (H) 04/10/2019   HCT 47.0 (H) 04/10/2019   MCV 93.6 04/10/2019   PLT 292 04/10/2019   NEUTROABS 3.5 07/03/2016   Lab Results  Component Value Date   NA 137 07/14/2016   K 3.7 07/14/2016   CL 106 07/14/2016   CO2 23 07/14/2016   GLUCOSE 113 (H) 07/14/2016   CREATININE 0.92 07/14/2016   CALCIUM 8.0 (L) 07/14/2016      RADIOGRAPHY: Nm Pet Image Initial (pi) Skull Base To Thigh  Result Date: 04/22/2019 CLINICAL DATA:  Initial treatment strategy for uterine carcinoma. EXAM: NUCLEAR MEDICINE PET SKULL BASE TO THIGH TECHNIQUE: 9.6 mCi F-18 FDG was injected intravenously. Full-ring PET imaging was performed from the skull base to thigh after the radiotracer. CT data was obtained and used for attenuation correction and anatomic localization. Fasting blood glucose: 96 mg/dl COMPARISON:  6 FINDINGS: Mediastinal blood pool activity: SUV max 2.79 Liver activity: SUV max NA NECK: No hypermetabolic lymph nodes in the neck. Incidental CT findings: none CHEST: No hypermetabolic mediastinal or hilar nodes. No suspicious pulmonary nodules on the CT scan. Incidental CT findings: none ABDOMEN/PELVIS: Post hysterectomy anatomy. Two small intensely hypermetabolic nodes in the RIGHT pelvis. Node in the deep perirectal fat along the piriformis muscle measures 9 mm (image 166/4) on the RIGHT with SUV max equal 12.7. Small RIGHT external iliac lymph node measuring 8 mm (image 161/4) with SUV max equal 9.2. Larger lobular node in the RIGHT inguinal nodal station measuring 3.6 x 3.4 cm SUV max equal 9.2. Focus of intenseradiotracer activity in the RIGHT operator space is favored an extension of the bladder. No evidence of metastatic adenopathy outside the pelvis. No abnormal activity in liver. Incidental CT findings: none SKELETON: No focal  hypermetabolic activity to suggest skeletal metastasis. Incidental CT findings: none IMPRESSION: 1. Two hypermetabolic RIGHT iliac lymph nodes and larger metastatic RIGHT inguinal lymph node. 2. No evidence of metastatic disease outside the pelvis. 3. No clear evidence of activity at the vaginal cuff. Electronically Signed   By: Suzy Bouchard M.D.   On: 04/22/2019 16:22   [place] Core Biopsy (lymph Nodes)  Result Date: 04/10/2019 INDICATION: History of endometrial cancer, now with indeterminate right inguinal lymphadenopathy. Please perform ultrasound-guided right inguinal lymph node biopsy for tissue diagnostic purposes. EXAM: ULTRASOUND-GUIDED RIGHT INGUINAL LYMPH NODE BIOPSY COMPARISON:  CT abdomen and pelvis - 03/28/2019 MEDICATIONS: None ANESTHESIA/SEDATION: Moderate (conscious) sedation was employed during this procedure. A total of Fentanyl 50 mcg was administered intravenously. Moderate Sedation Time: 10 minutes. The patient's level of consciousness and vital signs were monitored continuously by radiology nursing throughout the procedure under my direct supervision. COMPLICATIONS: None immediate. TECHNIQUE: Informed  written consent was obtained from the patient after a discussion of the risks, benefits and alternatives to treatment. Questions regarding the procedure were encouraged and answered. Initial ultrasound scanning demonstrated 2 adjacent malignant appearing right inguinal lymph nodes with dominant inguinal lymph node measuring 3.6 x 3.1 cm (image 3). An ultrasound image was saved for documentation purposes. The procedure was planned. A timeout was performed prior to the initiation of the procedure. The operative was prepped and draped in the usual sterile fashion, and a sterile drape was applied covering the operative field. A timeout was performed prior to the initiation of the procedure. Local anesthesia was provided with 1% lidocaine with epinephrine. Under direct ultrasound guidance, an 18  gauge core needle device was utilized to obtain to obtain 6 core needle biopsies of the dominant right inguinal lymph node. The samples were placed in saline and submitted to pathology. The needle was removed and hemostasis was achieved with manual compression. Post procedure scan was negative for significant hematoma. A dressing was placed. The patient tolerated the procedure well without immediate postprocedural complication. IMPRESSION: Technically successful ultrasound guided biopsy of dominant right inguinal lymph node. Electronically Signed   By: Sandi Mariscal M.D.   On: 04/10/2019 15:47      IMPRESSION: Recurrent endometrial cancer.  Patient's recurrence is limited to the right pelvic nodal chain and right inguinal area.  As above the patient is not felt to be a candidate for surgery to this area.  the patient has severe problems with lymphedema in the left lower extremity.  If she were to develop this issue along the right side the patient would not be able to work and would have  difficulty with walking.  She would be a good candidate for definitive course of radiation therapy along with either radiosensitizing chemotherapy or radiation therapy alone followed by full dose chemotherapy.  Patient wished to delay initiation of her therapy until mid November in order to make a smooth transition at her workplace when she is out.  I discussed with the patient with this would not be optimal with further delay in treatment.  She appears to understand this issue and will proceed with planning in the next several days to begin treatments in late October or early November.  She will make a determination at a later date as to whether she would proceed with radiosensitizing chemotherapy during her pelvic radiation treatments.  She reports not undergoing physical therapy evaluation in the past for her left lower extremity edema and will schedule this evaluation in the near future.  Today, I talked to the patient  about the findings and work-up thus far.  We discussed the natural history of recurrent endometrial cancer , highlighting the role of radiotherapy in the management.  We discussed the available radiation techniques, and focused on the details of logistics and delivery.  We reviewed the anticipated acute and late sequelae associated with radiation in this setting.  The patient was encouraged to ask questions that I answered to the best of my ability.  A patient consent form was discussed and signed.  We retained a copy for our records.  The patient would like to proceed with radiation and will be scheduled for CT simulation.  PLAN: Patient will return on October 19 for simulation and planning.  She will begin her treatment several days later.  Anticipate 25 treatments directed at the pelvis and inguinal areas bilaterally.  This will then be followed by a boost to the PET positive disease  along the right pelvis and right inguinal region.    ------------------------------------------------  Blair Promise, PhD, MD  This document serves as a record of services personally performed by Gery Pray, MD. It was created on his behalf by Wilburn Mylar, a trained medical scribe. The creation of this record is based on the scribe's personal observations and the provider's statements to them. This document has been checked and approved by the attending provider.

## 2019-04-28 NOTE — Patient Instructions (Signed)
Coronavirus (COVID-19) Are you at risk?  Are you at risk for the Coronavirus (COVID-19)?  To be considered HIGH RISK for Coronavirus (COVID-19), you have to meet the following criteria:  . Traveled to [place], [place], [place], [place] or [place]; or in the [place] to [place], [place], [place], or [place]; and have fever, cough, and shortness of breath within the last 2 weeks of travel OR . Been in close contact with a person diagnosed with COVID-19 within the last 2 weeks and have fever, cough, and shortness of breath . IF YOU DO NOT MEET THESE CRITERIA, YOU ARE CONSIDERED LOW RISK FOR COVID-19.  What to do if you are HIGH RISK for COVID-19?  . If you are having a medical emergency, call 911. . Seek medical care right away. Before you go to a doctor's office, urgent care or emergency department, call ahead and tell them about your recent travel, contact with someone diagnosed with COVID-19, and your symptoms. You should receive instructions from your physician's office regarding next steps of care.  . When you arrive at healthcare provider, tell the healthcare staff immediately you have returned from visiting [place], [place], [place], [place] or [place]; or traveled in the [place] to [place], [place], [place], or [place]; in the last two weeks or you have been in close contact with a person diagnosed with COVID-19 in the last 2 weeks.   . Tell the health care staff about your symptoms: fever, cough and shortness of breath. . After you have been seen by a medical provider, you will be either: o Tested for (COVID-19) and discharged home on quarantine except to seek medical care if symptoms worsen, and asked to  - Stay home and avoid contact with others until you get your results (4-5 days)  - Avoid travel on public transportation if possible (such as bus, train, or airplane) or o Sent to the Emergency Department by EMS for evaluation, COVID-19 testing, and possible  admission depending on your condition and test results.  What to do if you are LOW RISK for COVID-19?  Reduce your risk of any infection by using the same precautions used for avoiding the common cold or flu:  . Wash your hands often with soap and warm water for at least 20 seconds.  If soap and water are not readily available, use an alcohol-based hand sanitizer with at least 60% alcohol.  . If coughing or sneezing, cover your mouth and nose by coughing or sneezing into the elbow areas of your shirt or coat, into a tissue or into your sleeve (not your hands). . Avoid shaking hands with others and consider head nods or verbal greetings only. . Avoid touching your eyes, nose, or mouth with unwashed hands.  . Avoid close contact with people who are sick. . Avoid places or events with large numbers of people in one location, like concerts or sporting events. . Carefully consider travel plans you have or are making. . If you are planning any travel outside or inside the [place], visit the CDC's Travelers' Health webpage for the latest health notices. . If you have some symptoms but not all symptoms, continue to monitor at home and seek medical attention if your symptoms worsen. . If you are having a medical emergency, call 911.   ADDITIONAL HEALTHCARE OPTIONS FOR PATIENTS  [facility] / e-Visit: [email or web address]         [facility]: [phone]  [facility]: [phone]                   [facility]: [phone]   

## 2019-04-29 ENCOUNTER — Telehealth: Payer: Self-pay

## 2019-04-29 ENCOUNTER — Telehealth: Payer: Self-pay | Admitting: Oncology

## 2019-04-29 NOTE — Telephone Encounter (Signed)
[facility] and scheduled appointment to see Dr. Alvy Bimler on Wednesday, 05/07/19 at 8:15 am to discuss chemotherapy.    Dana [name] also said she has talked to her insurance and would like codes for the chemotherapy medications so that she can check how much her out of pocket costs would be.  Advised her that the medications will be preauthorized with her insurance before she begins treatment.  Also advised her that radiation will be the same - preauthorized before she begins.  Gave her the phone number for Ailene Ravel, [employer] Advocate with Radiation who may be able to help her find out her co pay amount.

## 2019-04-29 NOTE — Telephone Encounter (Signed)
Spoke with patient she wishes to change appointment for Ct message passed to Ct.

## 2019-04-30 ENCOUNTER — Ambulatory Visit: Payer: 59 | Attending: Radiation Oncology

## 2019-04-30 ENCOUNTER — Other Ambulatory Visit: Payer: Self-pay

## 2019-04-30 DIAGNOSIS — M79605 Pain in left leg: Secondary | ICD-10-CM | POA: Insufficient documentation

## 2019-04-30 DIAGNOSIS — I890 Lymphedema, not elsewhere classified: Secondary | ICD-10-CM | POA: Diagnosis present

## 2019-04-30 DIAGNOSIS — C774 Secondary and unspecified malignant neoplasm of inguinal and lower limb lymph nodes: Secondary | ICD-10-CM

## 2019-04-30 DIAGNOSIS — C55 Malignant neoplasm of uterus, part unspecified: Secondary | ICD-10-CM | POA: Insufficient documentation

## 2019-04-30 DIAGNOSIS — R293 Abnormal posture: Secondary | ICD-10-CM | POA: Diagnosis present

## 2019-04-30 NOTE — Therapy (Signed)
[place], [place], [address] Phone: [phone]   Fax:  [phone]  Physical Therapy Evaluation  Patient Details  Name: Dana [name] MRN: [id number] Date of Birth: 1951/01/08 Referring Provider (PT): Gery Pray   Encounter Date: 04/30/2019  PT End of Session - 04/30/19 0929    Visit Number  1    Number of Visits  15    Date for PT Re-Evaluation  06/18/19    PT Start Time  0800    PT Stop Time  0855    PT Time Calculation (min)  55 min    Activity Tolerance  Patient tolerated treatment well    Behavior During Therapy  [facility] for tasks assessed/performed       Past Medical History:  Diagnosis Date  . Arthritis   . GERD (gastroesophageal reflux disease)   . History of blood transfusion   . Hypothyroidism   . Lymphedema    left leg- ted hose makes it worse  . Thyroid disease   . Uterine cancer [facility])    Endometrial cancer - surgery and chemo    Past Surgical History:  Procedure Laterality Date  . ABDOMINAL HYSTERECTOMY    . APPENDECTOMY    . COLONOSCOPY W/ POLYPECTOMY    . EYE SURGERY Bilateral    cataracts  . LIVER BIOPSY    . mass removed     2015 from pelvis  . TOTAL HIP ARTHROPLASTY Right 07/13/2016   Procedure: RIGHT TOTAL HIP ARTHROPLASTY ANTERIOR APPROACH;  Surgeon: Leandrew Koyanagi, MD;  Location: [facility];  Service: Orthopedics;  Laterality: Right;    There were no vitals filed for this visit.   Subjective Assessment - 04/30/19 0805    Subjective  Pt reports that she has noticed swelling in her lower extremity since her surgery in 2015. She states that even before surgery she had swelling in her L lateral ankle. She states that then after chemotherapy her L lower leg strated to swell even more. She has been wearing knee high compression which seemed to just keep pushing the fluid up her leg. Pt states recently her R ankle has started to swell and she is concerned becuase she is  getting radiation on that side starting on 05/19/2019. She reports that her MD told her that the Amlopidine could cause edema in her ankle and had increased her dosage about 7 months ago.    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  5 minutes    Patient Stated Goals  I want to be able to walk again.    Currently in Pain?  Yes    Pain Score  3     Pain Location  Leg    Pain Orientation  Left    Pain Descriptors / Indicators  Tightness    Pain Type  Chronic pain    Pain Onset  More than a month ago    Pain Frequency  Constant    Aggravating Factors   Standing, walking    Pain Relieving Factors  Elevating legs.         [facility] PT Assessment - 04/30/19 0001      Assessment   Medical Diagnosis  Endometrioid Cancer    Referring Provider (PT)  Gery Pray    Next MD Visit  05/07/2019    Prior Therapy  Not for lymphedema       Precautions   Precautions  Other (comment)   Cancer     Balance Screen   Has the patient fallen in the past 6 months  No    Has the patient had a decrease in activity level because of a fear of falling?   Yes    Is the patient reluctant to leave their home because of a fear of falling?   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   Overall Cognitive Status  Within Functional Limits for tasks assessed      Posture/Postural Control   Posture/Postural Control  Postural limitations    Postural Limitations  Rounded Shoulders;Forward head        LYMPHEDEMA/ONCOLOGY QUESTIONNAIRE - 04/30/19 0827      Type   Cancer Type  Endometrioid Adenocarcinoma of the inguinal  nodes      Treatment   Active Chemotherapy Treatment  No    Past Chemotherapy Treatment  Yes    Date  05/17/14    Active Radiation Treatment  No    Past Radiation Treatment  No    Current Hormone Treatment  No    Past Hormone Therapy  No      What other symptoms do you have   Are you Having Heaviness or Tightness  Yes    Are you having Pain  Yes    Are you having pitting edema  Yes    Body Site  R lateral lower leg +1 and L lower leg    Is it Hard or Difficult finding clothes that fit  No    Do you have infections  No    Stemmer Sign  Yes      Lymphedema Stage   Stage  STAGE 2 SPONTANEOUSLY IRREVERSIBLE      Lymphedema Assessments   Lymphedema Assessments  Lower extremities             Outpatient Re[facility]  Lymphedema Life Impact Scale Total Score  70.59 %      Objective measurements completed on examination: See above findings.              PT Education - 04/30/19 0926    Education Details  Pt was educated on the anatomy and physiology of the lymphatic system. Discussed in depth the management of lymphedema in cluding exercise, skin care, compression and MLD. She was educated on the difference between circular and flat knit garments and the indications for each. Discussed and agreed upon POC including pt will come 1x/week for 2 weeks and then 3x/week for 4 weeks since she will be taking off work starting at the beginning of next month. Discussed risk reduction practices and e-maile due to printer is not working    [employer]) Educated  Patient    Methods  Explanation    Comprehension  Verbalized understanding       PT Short Term Goals - 04/30/19 1015      PT SHORT TERM GOAL #1   Title  Pt will be independent  with lymphatic facilitation exercises within 2 weeks.    Baseline  Pt did not have exercises    Time  2    Period  Weeks    Status  New    Target Date  05/21/19      PT SHORT TERM GOAL  #2   Title  Pt will be independent with self MLD to demonstrate autonomy of care    Baseline  Pt does not know how to perform MLD    Time  2    Period  Weeks    Status  New    Target Date  05/21/19        PT Long Term Goals - 04/30/19 1017      PT LONG TERM GOAL #1   Title  Pt will report 1/10 pain or less within 6 weeks in order to promote mobility and increase gait ability within    Baseline  3/10 pain in LLE    Time  6    Period  Weeks    Status  New    Target Date  06/18/19      PT LONG TERM GOAL #2   Title  Patient will have reduction of LLE limb  girth by 3 cm or greater and RLE lower leg limb girth by atleast 2 cm within 6 weeks to demonstrate decrease lymphatic build up and decrease risk for infection.    Time  6    Period  Weeks    Status  New    Target Date  06/18/19      PT LONG TERM GOAL #3   Title  Patient to be properly fitted with compression garment to wear on daily basis    Baseline  pt has knee high circular knit compression but requires flat knee thigh high on the L and possibly flat knit knee high on the R    Time  6    Period  Weeks    Status  New    Target Date  06/18/19             Plan - 04/30/19 0931    Clinical Impression Statement  Pt presents to physical therapy with lymphedema stage II noted in the LLE as demonstrated by positive stemmers sign and reports that her edema stays consistently in her LLE that goes up to about 10 cm above her L knee and stage II in her R lower leg as demonstrated by positive stemmer's sign. Measurements were taken today but did not save in the computer and patient had left so measurements will be re-taken next session. Measurements showed LLE larger than the R up to 10 cm above the L knee and then comparable measurements followed by the R thigh was slightly larger than the L possibly due to greater use of the RLE due to prolonged pain in the LLE secondary to lymphedema. Pt will benefit from skilled physical therapy  services in order to address the above mentioned limitations 1x/week for 2 weeks due to work for education/HEP/self MLD followed by 3x/week for 4 weeks for wrapping and MLD for the BLE to decrease risk for increased lymphedema and infection.    Personal Factors and Comorbidities  Comorbidity 1    Comorbidities  Endometrioid cancer    Stability/Clinical Decision Making  Stable/Uncomplicated    Clinical Decision Making  Low    Rehab Potential  Excellent    PT Frequency  3x / week   1x/week for the first 2 weeks  due to pt is currently working and unable to come 3x/week she is off work beginning of November and will start wrapping at that time.   PT Duration  6 weeks    PT Treatment/Interventions  Cryotherapy;Moist Heat;Gait training;Stair training;Functional mobility training;Therapeutic activities;Therapeutic exercise;Balance training;Neuromuscular re-education;Patient/family education;Manual techniques;Manual lymph drainage;Compression bandaging    PT Next Visit Plan  Please re-measure; measurements got deleted. 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[place]) 04/21/2019  . Hip joint replacement status 07/13/2016  . Primary osteoarthritis of  right hip 06/01/2016  . 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 Diarrhea 06/03/2015    Ander Purpura, PT 04/30/2019, 10:27 AM  [place], [place], [address] Phone: [phone]   Fax:  [phone]  Name: Dana [name] MRN: [id number] Date of Birth: [birth year]

## 2019-05-05 ENCOUNTER — Ambulatory Visit: Payer: 59 | Admitting: Radiation Oncology

## 2019-05-06 ENCOUNTER — Ambulatory Visit: Payer: 59

## 2019-05-07 ENCOUNTER — Other Ambulatory Visit: Payer: Self-pay

## 2019-05-07 ENCOUNTER — Encounter: Payer: Self-pay | Admitting: Hematology and Oncology

## 2019-05-07 ENCOUNTER — Inpatient Hospital Stay: Payer: 59 | Admitting: Hematology and Oncology

## 2019-05-07 DIAGNOSIS — Z5111 Encounter for antineoplastic chemotherapy: Secondary | ICD-10-CM | POA: Diagnosis not present

## 2019-05-07 DIAGNOSIS — I890 Lymphedema, not elsewhere classified: Secondary | ICD-10-CM | POA: Diagnosis not present

## 2019-05-07 DIAGNOSIS — C55 Malignant neoplasm of uterus, part unspecified: Secondary | ICD-10-CM | POA: Diagnosis not present

## 2019-05-07 NOTE — Assessment & Plan Note (Signed)
She has chronic left leg lymphedema from prior surgery She has been referred to cancer rehab facility and will be undergoing aggressive physical therapy for lymphedema

## 2019-05-07 NOTE — Progress Notes (Signed)
Dana [name] OFFICE PROGRESS NOTE  Patient Care Team: Shon Baton, MD as PCP - General (Internal Medicine)  ASSESSMENT & PLAN:  Endometrioid adenocarcinoma of uterus [facility]) I reviewed recent GYN oncology tumor board recommendation with the patient There is a slight difference in opinions about the sequencing of treatment Everybody agrees surgery would be risky and could potentially cause lifelong lymphedema in her leg and the risk outweighs the benefits She has agreed to accept radiation treatment In terms of chemotherapy, we discussed the approach and the goals of treatment For most recurrent cancer patients, the disease are considered incurable and the patient will likely need long-term treatment Personally, I would prefer for her to start radiation treatment first followed by repeat imaging study and to consolidate her future treatment with chemotherapy Alternatively, a more aggressive approach would be integration of cisplatin to be given on days 1 and 28, followed by chemotherapy of carboplatin and Taxol for 4 cycles within 2 to 3 months of finishing radiation treatment, followed by possible maintenance therapy The addition of cisplatin as radiosensitizing agent will give a small benefit but at the expense of potential risk of bone marrow suppression, peripheral neuropathy and nausea We discussed the risk, benefits, side effects of cisplatin Ultimately, she is undecided She will need a port placement before cisplatin along with chemo education class I have advised my GYN oncology navigator to follow-up with her decision early next week  Lymphedema She has chronic left leg lymphedema from prior surgery She has been referred to cancer rehab facility and will be undergoing aggressive physical therapy for lymphedema   No orders of the defined types were placed in this encounter.   INTERVAL HISTORY: Please see below for problem oriented charting. 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  The patient's initial surgery was with Dr. Marti Sleigh in [place] on January 05, 2014.  It included an exploratory laparotomy with TAH, BSO, appendectomy, pelvic and para-aortic lymphadenectomy and wedge resection of a concerning appearing liver lesion.  Preoperatively she presented with a 26 cm right adnexal mass and an elevated Ca1 25 at 404 units/mL.  At the completion of surgery there was no gross residual disease.  Final pathology showed a primary grade 3 endometrial adenocarcinoma (endometrioid) involving both ovaries.  Lymph nodes and other biopsies were negative.  She had uncomplicated postoperative course.  She was treated with 6 cycles of carboplatin paclitaxel chemotherapy.  She was recommended adjuvant radiation therapy however declined this after counseling.  Following her initial therapy she developed severe left lower extremity lymphedema.   Follow-up CT scan in March 2016 showed no evidence of disease.  She was followed every 3 months for a total of 5 years with no recurrence appreciated during that time   03/28/2019 Imaging   Ct abdomen and pelvis 1. Enlarged right inguinal lymph node or soft tissue mass, measuring 3.8 x 3.5 cm (series 2, image 69). Additional enlarged right iliac lymph node measuring  1.9 x 1.1 cm (series 2, image 68). Findings are concerning for malignancy.   2. Status post hysterectomy per  stated history of endometrial malignancy.   3. No other findings in the abdomen or pelvis concerning for malignancy or metastatic disease.   4. There are multiple subcentimeter low-attenuation lesions of the liver, the largest in the left lobe the measuring 7 mm, too small to characterize (series 2, image 24). Attention on follow-up although these are most likely incidental benign small cysts and/or hemangiomata.   5.  Aortic atherosclerosis.   04/10/2019 Pathology Results   A. LYMPH NODE, RIGHT INGUINAL, NEEDLE CORE BIOPSY:  - Metastatic adenocarcinoma.  - See comment.   COMMENT:   The morphology is most consistent with metastatic endometrioid adenocarcinoma.  Immunohistochemistry can be performed if clinically indicated.  There is sufficient tissue for additional testing   04/10/2019 Procedure   Technically successful ultrasound guided biopsy of dominant right inguinal lymph node   04/22/2019 Cancer Staging   Staging form: Corpus Uteri - Carcinoma and Carcinosarcoma, AJCC 8th Edition - Pathologic stage from 04/22/2019: FIGO Stage IVB (rpT3a, pN0, pM1) - Signed by [name], MD on 04/22/2019   04/22/2019 PET scan   1. Two hypermetabolic RIGHT iliac lymph nodes and larger metastatic RIGHT inguinal lymph node. 2. No evidence of metastatic disease outside the pelvis. 3. No clear evidence of activity at the vaginal cuff.     REVIEW OF SYSTEMS:   Constitutional: Denies fevers, chills or abnormal weight loss Eyes: Denies blurriness of vision Ears, nose, mouth, throat, and face: Denies mucositis or sore throat Respiratory: Denies cough, dyspnea or wheezes Cardiovascular: Denies palpitation, chest discomfort  Gastrointestinal:  Denies nausea, heartburn or change in bowel habits Skin: Denies abnormal skin rashes Lymphatics: Denies new lymphadenopathy or easy bruising Neurological:Denies numbness, tingling or new weaknesses Behavioral/Psych: Mood is stable, no new changes  All other  systems were reviewed with the patient and are negative.  I have reviewed the past medical history, past surgical history, social history and family history with the patient and they are unchanged from previous note.  ALLERGIES:  is allergic to oxycodone; garlic; other; and penicillins.  MEDICATIONS:  Current Outpatient Medications  Medication Sig Dispense Refill  . amLODipine (NORVASC) 5 MG tablet Take 5 mg by mouth daily.     Marland Kitchen ibuprofen (ADVIL) 200 MG tablet Take 200 mg by mouth every 6 (six) hours as needed.    . traMADol (ULTRAM) 50 MG tablet TK 1 T PO  BID PRF PAINS.     No current facility-administered medications for this visit.     PHYSICAL EXAMINATION: ECOG PERFORMANCE STATUS: 1 - Symptomatic but completely ambulatory  Vitals:   05/07/19 0832  BP: 137/90  Pulse: 77  Resp: 18  Temp: 97.8 F (36.6 C)  SpO2: 99%   Filed Weights   05/07/19 0832  Weight: 179 lb 1.6 oz (81.2 kg)    GENERAL:alert, no distress and comfortable NEURO: alert & oriented x 3 with fluent speech, no focal motor/sensory deficits  LABORATORY DATA:  I have reviewed the data as listed    Component Value Date/Time   NA 137 07/14/2016 0530   K 3.7 07/14/2016 0530   CL 106 07/14/2016 0530   CO2 23 07/14/2016 0530   GLUCOSE 113 (H) 07/14/2016 0530   BUN 15 07/14/2016 0530   CREATININE 0.92 07/14/2016 0530   CALCIUM 8.0 (L) 07/14/2016 0530   PROT 6.1 (L) 07/03/2016 0921   ALBUMIN 3.7 07/03/2016 [id number]  AST 17 07/03/2016 0921   ALT 14 07/03/2016 0921   ALKPHOS 63 07/03/2016 0921   BILITOT 0.8 07/03/2016 0921   GFRNONAA >60 07/14/2016 0530   GFRAA >60 07/14/2016 0530    No results found for: SPEP, UPEP  Lab Results  Component Value Date   WBC 6.8 04/10/2019   NEUTROABS 3.5 07/03/2016   HGB 15.3 (H) 04/10/2019   HCT 47.0 (H) 04/10/2019   MCV 93.6 04/10/2019   PLT 292 04/10/2019      Chemistry      Component Value Date/Time   NA 137 07/14/2016 0530   K 3.7 07/14/2016 0530   CL  106 07/14/2016 0530   CO2 23 07/14/2016 0530   BUN 15 07/14/2016 0530   CREATININE 0.92 07/14/2016 0530      Component Value Date/Time   CALCIUM 8.0 (L) 07/14/2016 0530   ALKPHOS 63 07/03/2016 0921   AST 17 07/03/2016 0921   ALT 14 07/03/2016 0921   BILITOT 0.8 07/03/2016 0921       RADIOGRAPHIC STUDIES: I have personally reviewed the radiological images as listed and agreed with the findings in the report. Nm Pet Image Initial (pi) Skull Base To Thigh  Result Date: 04/22/2019 CLINICAL DATA:  Initial treatment strategy for uterine carcinoma. EXAM: NUCLEAR MEDICINE PET SKULL BASE TO THIGH TECHNIQUE: 9.6 mCi F-18 FDG was injected intravenously. Full-ring PET imaging was performed from the skull base to thigh after the radiotracer. CT data was obtained and used for attenuation correction and anatomic localization. Fasting blood glucose: 96 mg/dl COMPARISON:  6 FINDINGS: Mediastinal blood pool activity: SUV max 2.79 Liver activity: SUV max NA NECK: No hypermetabolic lymph nodes in the neck. Incidental CT findings: none CHEST: No hypermetabolic mediastinal or hilar nodes. No suspicious pulmonary nodules on the CT scan. Incidental CT findings: none ABDOMEN/PELVIS: Post hysterectomy anatomy. Two small intensely hypermetabolic nodes in the RIGHT pelvis. Node in the deep perirectal fat along the piriformis muscle measures 9 mm (image 166/4) on the RIGHT with SUV max equal 12.7. Small RIGHT external iliac lymph node measuring 8 mm (image 161/4) with SUV max equal 9.2. Larger lobular node in the RIGHT inguinal nodal station measuring 3.6 x 3.4 cm SUV max equal 9.2. Focus of intenseradiotracer activity in the RIGHT operator space is favored an extension of the bladder. No evidence of metastatic adenopathy outside the pelvis. No abnormal activity in liver. Incidental CT findings: none SKELETON: No focal hypermetabolic activity to suggest skeletal metastasis. Incidental CT findings: none IMPRESSION: 1. Two  hypermetabolic RIGHT iliac lymph nodes and larger metastatic RIGHT inguinal lymph node. 2. No evidence of metastatic disease outside the pelvis. 3. No clear evidence of activity at the vaginal cuff. Electronically Signed   By: Suzy Bouchard M.D.   On: 04/22/2019 16:22   [place] Core Biopsy (lymph Nodes)  Result Date: 04/10/2019 INDICATION: History of endometrial cancer, now with indeterminate right inguinal lymphadenopathy. Please perform ultrasound-guided right inguinal lymph node biopsy for tissue diagnostic purposes. EXAM: ULTRASOUND-GUIDED RIGHT INGUINAL LYMPH NODE BIOPSY COMPARISON:  CT abdomen and pelvis - 03/28/2019 MEDICATIONS: None ANESTHESIA/SEDATION: Moderate (conscious) sedation was employed during this procedure. A total of Fentanyl 50 mcg was administered intravenously. Moderate Sedation Time: 10 minutes. The patient's level of consciousness and vital signs were monitored continuously by radiology nursing throughout the procedure under my direct supervision. COMPLICATIONS: None immediate. TECHNIQUE: Informed written consent was obtained from the patient after a discussion of the risks, benefits and alternatives to treatment. Questions regarding the procedure  were encouraged and answered. Initial ultrasound scanning demonstrated 2 adjacent malignant appearing right inguinal lymph nodes with dominant inguinal lymph node measuring 3.6 x 3.1 cm (image 3). An ultrasound image was saved for documentation purposes. The procedure was planned. A timeout was performed prior to the initiation of the procedure. The operative was prepped and draped in the usual sterile fashion, and a sterile drape was applied covering the operative field. A timeout was performed prior to the initiation of the procedure. Local anesthesia was provided with 1% lidocaine with epinephrine. Under direct ultrasound guidance, an 18 gauge core needle device was utilized to obtain to obtain 6 core needle biopsies of the dominant right  inguinal lymph node. The samples were placed in saline and submitted to pathology. The needle was removed and hemostasis was achieved with manual compression. Post procedure scan was negative for significant hematoma. A dressing was placed. The patient tolerated the procedure well without immediate postprocedural complication. IMPRESSION: Technically successful ultrasound guided biopsy of dominant right inguinal lymph node. Electronically Signed   By: Sandi Mariscal M.D.   On: 04/10/2019 15:47    All questions were answered. The patient knows to call the clinic with any problems, questions or concerns. No barriers to learning was detected.  I spent 25 minutes counseling the patient face to face. The total time spent in the appointment was 30 minutes and more than 50% was on counseling and review of test results  [name], MD 05/07/2019 9:20 AM

## 2019-05-07 NOTE — Assessment & Plan Note (Signed)
I reviewed recent GYN oncology tumor board recommendation with the patient There is a slight difference in opinions about the sequencing of treatment Everybody agrees surgery would be risky and could potentially cause lifelong lymphedema in her leg and the risk outweighs the benefits She has agreed to accept radiation treatment In terms of chemotherapy, we discussed the approach and the goals of treatment For most recurrent cancer patients, the disease are considered incurable and the patient will likely need long-term treatment Personally, I would prefer for her to start radiation treatment first followed by repeat imaging study and to consolidate her future treatment with chemotherapy Alternatively, a more aggressive approach would be integration of cisplatin to be given on days 1 and 28, followed by chemotherapy of carboplatin and Taxol for 4 cycles within 2 to 3 months of finishing radiation treatment, followed by possible maintenance therapy The addition of cisplatin as radiosensitizing agent will give a small benefit but at the expense of potential risk of bone marrow suppression, peripheral neuropathy and nausea We discussed the risk, benefits, side effects of cisplatin Ultimately, she is undecided She will need a port placement before cisplatin along with chemo education class I have advised my GYN oncology navigator to follow-up with her decision early next week

## 2019-05-08 ENCOUNTER — Encounter: Payer: Self-pay | Admitting: Physical Therapy

## 2019-05-08 ENCOUNTER — Telehealth: Payer: Self-pay | Admitting: Oncology

## 2019-05-08 ENCOUNTER — Ambulatory Visit: Payer: 59 | Admitting: Physical Therapy

## 2019-05-08 DIAGNOSIS — I890 Lymphedema, not elsewhere classified: Secondary | ICD-10-CM

## 2019-05-08 DIAGNOSIS — C55 Malignant neoplasm of uterus, part unspecified: Secondary | ICD-10-CM

## 2019-05-08 DIAGNOSIS — C774 Secondary and unspecified malignant neoplasm of inguinal and lower limb lymph nodes: Secondary | ICD-10-CM | POA: Diagnosis not present

## 2019-05-08 NOTE — Telephone Encounter (Signed)
Called [facility] and scheduled a lab appointment for after her CT [facility] on Monday.  She verbalized understanding and agreement.

## 2019-05-08 NOTE — Patient Instructions (Signed)
Deep Effective Breath   Standing, sitting, or laying down place both hands on the belly. Take a deep breath IN, expanding the belly; then breath OUT, contracting the belly. Repeat __5__ times. Do __2-3__ sessions per day and before each self massage.  http://gt2.exer.us/866   Copyright  VHI. All rights reserved.  Inguinal Nodes to Axilla - Clear   On involved side, at armpit, make _5__ in-place circles. Then from hip proceed in sections to armpit with stationary circles or pumps _5_ times, this is your pathway. Do _1__ time per day.  Copyright  VHI. All rights reserved.  LEG: Knee to Hip - Clear   Pump up outer thigh of involved leg from knee to outer hip. Then do stationary circles from inner to outer thigh, then do outer thigh again. Next, interlace fingers behind knee IF ABLE and make in-place circles. Do _5_ times of each sequence.  Do _1__ time per day.  Copyright  VHI. All rights reserved.  LEG: Ankle to Hip Sweep   Hands on sides of ankle of involved leg, pump _5__ times up both sides of lower leg, then retrace steps up outer thigh to hip as before and back to pathway. Do _2-3_ times. Do __1_ time per day.  Copyright  VHI. All rights reserved.  FOOT: Dorsum of Foot and Toes Massage   One hand on top of foot make _5_ stationary circles or pumps, then either on top of toes or each individual toe do _5_ pumps. Then retrace all steps pumping back up both sides of lower leg, outer thigh, and then pathway. Finish with what you started with, _5_ circles at involved side arm pit. All _2-3_ times at each sequence. Do _1__ time per day.  Copyright  VHI. All rights reserved.    

## 2019-05-08 NOTE — Therapy (Signed)
[place], [place], [address] Phone: [phone]   Fax:  [phone]  Physical Therapy Treatment  Patient Details  Name: Dana [name] MRN: [id number] Date of Birth: 1951-05-25 Referring Provider (PT): Gery Pray   Encounter Date: 05/08/2019  PT End of Session - 05/08/19 1552    Visit Number  2    Number of Visits  15    Date for PT Re-Evaluation  06/18/19    PT Start Time  1501    PT Stop Time  1546    PT Time Calculation (min)  45 min    Activity Tolerance  Patient tolerated treatment well    Behavior During Therapy  [facility] for tasks assessed/performed       Past Medical History:  Diagnosis Date  . Arthritis   . GERD (gastroesophageal reflux disease)   . History of blood transfusion   . Hypothyroidism   . Lymphedema    left leg- ted hose makes it worse  . Thyroid disease   . Uterine cancer [facility])    Endometrial cancer - surgery and chemo    Past Surgical History:  Procedure Laterality Date  . ABDOMINAL HYSTERECTOMY    . APPENDECTOMY    . COLONOSCOPY W/ POLYPECTOMY    . EYE SURGERY Bilateral    cataracts  . LIVER BIOPSY    . mass removed     2015 from pelvis  . TOTAL HIP ARTHROPLASTY Right 07/13/2016   Procedure: RIGHT TOTAL HIP ARTHROPLASTY ANTERIOR APPROACH;  Surgeon: Leandrew Koyanagi, MD;  Location: [place];  Service: Orthopedics;  Laterality: Right;    There were no vitals filed for this visit.  Subjective Assessment - 05/08/19 1500    Subjective  The left leg is feeling pretty big. The swelling gets worse as the week goes on. I have to wear clogs by Friday.    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  5 minutes    Patient Stated Goals  I want to be able to walk again.    Currently in Pain?  No/denies    Pain Score  0-No pain             LYMPHEDEMA/ONCOLOGY QUESTIONNAIRE - 05/08/19 1504      Right Lower Extremity Lymphedema   20 cm Proximal to Suprapatella  59.5 cm    10 cm Proximal to Suprapatella  50.5 cm    At Midpatella/Popliteal Crease  40.5 cm    30 cm Proximal to Floor at Lateral Plantar Foot  41.5 cm    20 cm Proximal to Floor at Lateral Plantar Foot  33.3 1    10  cm Proximal to Floor at Lateral Malleoli  24 cm    5 cm Proximal to 1st MTP Joint  22 cm    Across MTP Joint  22.2 cm    Around Proximal Great Toe  8 cm      Left Lower Extremity Lymphedema   20 cm Proximal to Suprapatella  55 cm    10 cm Proximal to Suprapatella  49.7 cm    At Midpatella/Popliteal Crease  43.5 cm    30 cm Proximal to Floor at Lateral Plantar Foot  49.9 cm    20 cm Proximal to Floor at Lateral Plantar Foot  42 cm    10 cm Proximal to Floor  at Lateral Malleoli  30 cm    5 cm Proximal to 1st MTP Joint  22.3 cm    Across MTP Joint  23 cm    Around Proximal Great Toe  8 cm           Outpatient [facility]  Lymphedema Life Impact Scale Total Score  70.59 %           [facility] Adult PT Treatment/Exercise - 05/08/19 0001      Manual Therapy   Manual Therapy  Manual Lymphatic Drainage (MLD);Edema management    Edema Management  circumferential measurements    Manual Lymphatic Drainage (MLD)  In supine with head of bed elevated: issued handout and instructed pt throughout in correct technique and sequence as follows: 5 diaphragmatic breaths, left axillary nodes and establishment of axillo inguinal pathway, L LE working proximal to distal then retracing all steps               PT Short Term Goals - 04/30/19 1015      PT SHORT TERM GOAL #1   Title  Pt will be independent with lymphatic facilitation exercises within 2 weeks.    Baseline  Pt did not have exercises    Time  2    Period  Weeks    Status  New    Target Date  05/21/19      PT SHORT  TERM GOAL #2   Title  Pt will be independent with self MLD to demonstrate autonomy of care    Baseline  Pt does not know how to perform MLD    Time  2    Period  Weeks    Status  New    Target Date  05/21/19        PT Long Term Goals - 04/30/19 1017      PT LONG TERM GOAL #1   Title  Pt will report 1/10 pain or less within 6 weeks in order to promote mobility and increase gait ability within    Baseline  3/10 pain in LLE    Time  6    Period  Weeks    Status  New    Target Date  06/18/19      PT LONG TERM GOAL #2   Title  Patient will have reduction of LLE limb  girth by 3 cm or greater and RLE lower leg limb girth by atleast 2 cm within 6 weeks to demonstrate decrease lymphatic build up and decrease risk for infection.    Time  6    Period  Weeks    Status  New    Target Date  06/18/19      PT LONG TERM GOAL #3   Title  Patient to be properly fitted with compression garment to wear on daily basis    Baseline  pt has knee high circular knit compression but requires flat knee thigh high on the L and possibly flat knit knee high on the R    Time  6    Period  Weeks    Status  New    Target Date  06/18/19            Plan - 05/08/19 1552    Clinical Impression Statement  Remeasured pt's circumferences today. Pt is more swollen on LLE. Began instructing pt in self MLD to LLE today and issued handout. Educated pt in correct hand technique and sequence. Pt has  limited right shoulder ROM which may make it difficult for her to use that arm for self MLD. She would benefit from a compression pump because of this.    PT Frequency  3x / week   1x/wk for 2 weeks then increasing to 3x/wk for 4 wks for CDT   PT Duration  6 weeks    PT Treatment/Interventions  Cryotherapy;Moist Heat;Gait training;Stair training;Functional mobility training;Therapeutic activities;Therapeutic exercise;Balance training;Neuromuscular re-education;Patient/family education;Manual techniques;Manual lymph  drainage;Compression bandaging    PT Next Visit Plan  follow up with [facility], pt reports she has not heard from them, HEP for lymphedema in the lower extremities, continue teaching self MLD, first two visits then beginning of november begin MLD and wrapping    Consulted and Agree with Plan of Care  Patient       Patient will benefit from skilled therapeutic intervention in order to improve the following deficits and impairments:  Pain, Postural dysfunction, Decreased knowledge of precautions, Increased edema  Visit Diagnosis: Lymphedema, not elsewhere classified     Problem List Patient Active Problem List   Diagnosis Date Noted  . Lymphedema   . 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[place]) 04/21/2019  . Hip joint replacement status 07/13/2016  . Primary osteoarthritis of right hip 06/01/2016  . Endometrioid adenocarcinoma of uterus ([place]) 06/03/2015  . Diarrhea 06/03/2015    Allyson Sabal [facility] 05/08/2019, 3:55 PM  [place], [place], [address] Phone: [phone]   Fax:  [phone]  Name: MARKIYAH MALTER MRN: [id number] Date of Birth: 1951-05-19  Manus Gunning, PT 05/08/19 3:56 PM

## 2019-05-12 ENCOUNTER — Inpatient Hospital Stay: Payer: 59

## 2019-05-12 ENCOUNTER — Other Ambulatory Visit: Payer: Self-pay

## 2019-05-12 ENCOUNTER — Ambulatory Visit
Admission: RE | Admit: 2019-05-12 | Discharge: 2019-05-12 | Disposition: A | Discharge status: Home / Self Care | Payer: 59 | Source: Ambulatory Visit | Attending: Radiation Oncology | Admitting: Radiation Oncology

## 2019-05-12 ENCOUNTER — Encounter: Payer: Self-pay | Admitting: Oncology

## 2019-05-12 ENCOUNTER — Telehealth: Payer: Self-pay | Admitting: Hematology and Oncology

## 2019-05-12 ENCOUNTER — Other Ambulatory Visit: Payer: Self-pay | Admitting: Hematology and Oncology

## 2019-05-12 DIAGNOSIS — C55 Malignant neoplasm of uterus, part unspecified: Secondary | ICD-10-CM

## 2019-05-12 DIAGNOSIS — C541 Malignant neoplasm of endometrium: Secondary | ICD-10-CM | POA: Diagnosis not present

## 2019-05-12 DIAGNOSIS — Z7189 Other specified counseling: Secondary | ICD-10-CM | POA: Insufficient documentation

## 2019-05-12 MED ORDER — ONDANSETRON HCL 8 MG PO TABS: 8.0000 mg | ORAL_TABLET | Freq: Three times a day (TID) | ORAL | 1 refills | Status: DC | PRN

## 2019-05-12 MED ORDER — PROCHLORPERAZINE MALEATE 10 MG PO TABS: 10.0000 mg | ORAL_TABLET | Freq: Four times a day (QID) | ORAL | 1 refills | Status: DC | PRN

## 2019-05-12 MED ORDER — LIDOCAINE-PRILOCAINE 2.5-2.5 % EX CREA: TOPICAL_CREAM | CUTANEOUS | 3 refills | Status: DC

## 2019-05-12 NOTE — Progress Notes (Signed)
START OFF PATHWAY REGIMEN - Uterine   [id number]:Cisplatin 40 mg/m2 IV D1 q7 Days + RT:   A cycle is every 7 days:     Cisplatin   **Always confirm dose/schedule in your pharmacy ordering system**  Administration Notes: cisplatin to be given on days 1 and 28 only  Patient Characteristics: Endometrioid Histology, Recurrent/Progressive Disease, Second Line, Vaginal Recurrence Only, No Prior Radiation Therapy Histology: Endometrioid Histology Therapeutic Status: Recurrent or Progressive Disease AJCC T Category: T3 AJCC N Category: N0 AJCC M Category: M1 AJCC 8 Stage Grouping: IVB Line of Therapy: Second Line Radiation Therapy Status: No Prior Radiation Therapy Intent of Therapy: Curative Intent, Not Discussed with Patient

## 2019-05-12 NOTE — Progress Notes (Signed)
Dana [name] has decided to have cisplatin chemotherapy on day 1 and 28 concurrent with radiation followed by carboplatin and Taxol for 4 cycles.  Advised her that a port placement will be scheduled for this week if possible, that chemotherapy will start Friday, 10/30 or Monday, 11/2 and that a patient education class has been scheduled for Thursday, 05/15/19 at 4 pm.  She verbalized agreement and understanding.  She has also been given a new calender will all currently scheduled appointments.

## 2019-05-12 NOTE — Telephone Encounter (Signed)
Received telephone call on the patient's decision She would like to proceed with concurrent chemotherapy with cisplatin and radiation followed by carboplatin and Taxol I will order baseline blood work, chemo education class, prescriptions of EMLA cream along with antiemetics, chemotherapy as well as port placement.  We will try to get them scheduled as soon as possible.

## 2019-05-13 ENCOUNTER — Other Ambulatory Visit: Payer: Self-pay | Admitting: Radiology

## 2019-05-13 ENCOUNTER — Telehealth: Payer: Self-pay | Admitting: Hematology and Oncology

## 2019-05-13 ENCOUNTER — Ambulatory Visit: Payer: 59

## 2019-05-13 DIAGNOSIS — C55 Malignant neoplasm of uterus, part unspecified: Secondary | ICD-10-CM

## 2019-05-13 DIAGNOSIS — I890 Lymphedema, not elsewhere classified: Secondary | ICD-10-CM

## 2019-05-13 DIAGNOSIS — R293 Abnormal posture: Secondary | ICD-10-CM

## 2019-05-13 DIAGNOSIS — M79605 Pain in left leg: Secondary | ICD-10-CM

## 2019-05-13 DIAGNOSIS — C774 Secondary and unspecified malignant neoplasm of inguinal and lower limb lymph nodes: Secondary | ICD-10-CM

## 2019-05-13 NOTE — Therapy (Signed)
[place], [place], [address] Phone: [phone]   Fax:  [phone]  Physical Therapy Treatment  Patient Details  Name: Dana [name] MRN: [id number] Date of Birth: [birth year] Referring Provider (PT): Gery Pray   Encounter Date: 05/13/2019  PT End of Session - 05/13/19 1650    Visit Number  3    Number of Visits  15    Date for PT Re-Evaluation  06/18/19    PT Start Time  [id number]    PT Stop Time  1648    PT Time Calculation (min)  53 min    Activity Tolerance  Patient tolerated treatment well    Behavior During Therapy  [facility] for tasks assessed/performed       Past Medical History:  Diagnosis Date  . Arthritis   . GERD (gastroesophageal reflux disease)   . History of blood transfusion   . Hypothyroidism   . Lymphedema    left leg- ted hose makes it worse  . Thyroid disease   . Uterine cancer [facility])    Endometrial cancer - surgery and chemo    Past Surgical History:  Procedure Laterality Date  . ABDOMINAL HYSTERECTOMY    . APPENDECTOMY    . COLONOSCOPY W/ POLYPECTOMY    . EYE SURGERY Bilateral    cataracts  . LIVER BIOPSY    . mass removed     2015 from pelvis  . TOTAL HIP ARTHROPLASTY Right 07/13/2016   Procedure: RIGHT TOTAL HIP ARTHROPLASTY ANTERIOR APPROACH;  Surgeon: Leandrew Koyanagi, MD;  Location: [place];  Service: Orthopedics;  Laterality: Right;    There were no vitals filed for this visit.               Outpatient Rehab from [facility]/30/2019 [facility]  Lymphedema Life Impact Scale Total Score  70.59 %           [facility] Adult PT Treatment/Exercise - 05/13/19 0001      Exercises   Exercises  Other Exercises    Other Exercises   Lymphatic facilitation exercises for the BLE including cervical flexion/extension, cervical rotation R/L, cervicla side bending R/L, Shoulder flexion, Shoulder shrugs, scapular retraction, Trunk side  bending R/L, hip extension in standing R/L, KNee flexion/extension R/L, Ankle DF/PF R/L 5x for each movement.       Manual Therapy   Manual Therapy  Manual Lymphatic Drainage (MLD)    Manual Lymphatic Drainage (MLD)  In supine, instructed pt throughout session. Short neck, Bil axillary nodes, Bil inguinal nodes, Bil inguino-axillary anastomosis, LLE medial-lateral, lateral, posterior, medial knee, posterior knee, all surface of the lower leg, medial/lateral ankle then re-worked all surfaces and 5 diaphragmatic breathes.              PT Education - 05/13/19 1619    Education Details  Access Code: [email or web address] pt was provided w/new HEP to perform with demonstration and explantion for how this stimulate lymphatic flow. Discussed direction, pressure, body positioning and hand placement for self MLD. Discussed in depth type of shoe to wear for wrapping next week and provided pt with visuals.    Person(s) Educated  Patient    Methods  Explanation    Comprehension  Verbalized understanding       PT Short Term Goals - 04/30/19 1015      PT SHORT TERM GOAL #1   Title  Pt will be independent with lymphatic facilitation exercises within 2 weeks.  Baseline  Pt did not have exercises    Time  2    Period  Weeks    Status  New    Target Date  05/21/19      PT SHORT TERM GOAL #2   Title  Pt will be independent with self MLD to demonstrate autonomy of care    Baseline  Pt does not know how to perform MLD    Time  2    Period  Weeks    Status  New    Target Date  05/21/19        PT Long Term Goals - 04/30/19 1017      PT LONG TERM GOAL #1   Title  Pt will report 1/10 pain or less within 6 weeks in order to promote mobility and increase gait ability within    Baseline  3/10 pain in LLE    Time  6    Period  Weeks    Status  New    Target Date  06/18/19      PT LONG TERM GOAL #2   Title  Patient will have reduction of LLE limb  girth by 3 cm or greater and RLE lower leg limb girth by  atleast 2 cm within 6 weeks to demonstrate decrease lymphatic build up and decrease risk for infection.    Time  6    Period  Weeks    Status  New    Target Date  06/18/19      PT LONG TERM GOAL #3   Title  Patient to be properly fitted with compression garment to wear on daily basis    Baseline  pt has knee high circular knit compression but requires flat knee thigh high on the L and possibly flat knit knee high on the R    Time  6    Period  Weeks    Status  New    Target Date  06/18/19            Plan - 05/13/19 1603    Clinical Impression Statement  Pt presents with continued edema in her LLE compared to R with edema in B lower legs and upper L leg. Pt responds well to MLD as demonstrated by significant decrease in indentation from sock on the LLE following MLD for the trunk and LLE. Pt was educated throughout and discussed wrapping, shoes to be worn during wrapping, lymphatic facilitative exercises and purpose of MLD/vasopneumatic pump. Discussed direction, hand placement, body positioning and pressure througout MLD this session for pt home self MLD. Pt will benefit from continued POC.    Personal Factors and Comorbidities  Comorbidity 1    Comorbidities  Endometrioid cancer    Stability/Clinical Decision Making  Stable/Uncomplicated    PT Frequency  3x / week    PT Duration  6 weeks    PT Treatment/Interventions  Cryotherapy;Moist Heat;Gait training;Stair training;Functional mobility training;Therapeutic activities;Therapeutic exercise;Balance training;Neuromuscular re-education;Patient/family education;Manual techniques;Manual lymph drainage;Compression bandaging    PT Next Visit Plan  Measurements, Ask if Flexitouch was able to make contact, assess LE lymphedema HEP and MLD. Beginning of november begin MLD and wrapping.    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 Goals of care, counseling/discussion 05/12/2019  . Lymphedema   . 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[place]) 04/21/2019  . Hip joint replacement status 07/13/2016  . Primary osteoarthritis of right hip 06/01/2016  . Endometrioid adenocarcinoma of uterus ([place]) 06/03/2015  . Diarrhea 06/03/2015    Ander Purpura, PT 05/13/2019, 4:57 PM  [place], [place], [address] Phone: [phone]   Fax:  [phone]  Name: Dana [name] MRN: [id number] Date of Birth: [birth year]

## 2019-05-13 NOTE — Patient Instructions (Signed)
Access Code: [email or web address]  URL: [email or web address]  Date: 05/13/2019  Prepared by: Tomma Rakers   Exercises Standing Upper Cervical Flexion and Extension - 20 reps - 1 sets - 1x daily - 7x weekly Standing Cervical Sidebending AROM - 20 reps - 1 sets - 1x daily - 7x weekly Standing Cervical Rotation AROM - 20 reps - 1 sets - 1x daily - 7x weekly Standing Shoulder Flexion Full Range - 20 reps - 1 sets - 1x daily - 7x weekly Standing Shoulder Shrugs - 20 reps - 1 sets - 1x daily - 7x weekly Standing Scapular Retraction - 20 reps - 1 sets - 1x daily - 7x weekly Trunk Sidebending with Compression Garment - 20 reps - 1 sets - 1x daily - 7x weekly Standing Hip Extension with Counter Support - 20 reps - 1 sets - 1x daily - 7x weekly Seated Knee Flexion Extension AROM - 20 reps - 1 sets - 1x daily - 7x weekly Heel Toe Raises with Counter Support - 20 reps - 1 sets - 1x daily - 7x weekly

## 2019-05-13 NOTE — Telephone Encounter (Signed)
Called pt per 10/26 sch message - unable to reach pt . Left message with appt date and time

## 2019-05-15 ENCOUNTER — Inpatient Hospital Stay: Payer: 59

## 2019-05-15 ENCOUNTER — Ambulatory Visit (HOSPITAL_COMMUNITY)
Admission: RE | Admit: 2019-05-15 | Discharge: 2019-05-15 | Disposition: A | Discharge status: Home / Self Care | Payer: 59 | Source: Ambulatory Visit | Attending: Hematology and Oncology | Admitting: Hematology and Oncology

## 2019-05-15 ENCOUNTER — Other Ambulatory Visit: Payer: Self-pay

## 2019-05-15 ENCOUNTER — Ambulatory Visit (HOSPITAL_COMMUNITY)
Admission: RE | Admit: 2019-05-15 | Discharge: 2019-05-15 | Disposition: A | Discharge status: Home / Self Care | Payer: 59 | Source: Ambulatory Visit

## 2019-05-15 ENCOUNTER — Encounter: Payer: Self-pay | Admitting: Hematology and Oncology

## 2019-05-15 ENCOUNTER — Encounter (HOSPITAL_COMMUNITY): Payer: Self-pay

## 2019-05-15 DIAGNOSIS — C801 Malignant (primary) neoplasm, unspecified: Secondary | ICD-10-CM | POA: Diagnosis not present

## 2019-05-15 DIAGNOSIS — K219 Gastro-esophageal reflux disease without esophagitis: Secondary | ICD-10-CM | POA: Diagnosis not present

## 2019-05-15 DIAGNOSIS — Z807 Family history of other malignant neoplasms of lymphoid, hematopoietic and related tissues: Secondary | ICD-10-CM | POA: Insufficient documentation

## 2019-05-15 DIAGNOSIS — Z880 Allergy status to penicillin: Secondary | ICD-10-CM | POA: Insufficient documentation

## 2019-05-15 DIAGNOSIS — Z90710 Acquired absence of both cervix and uterus: Secondary | ICD-10-CM | POA: Insufficient documentation

## 2019-05-15 DIAGNOSIS — Z885 Allergy status to narcotic agent status: Secondary | ICD-10-CM | POA: Diagnosis not present

## 2019-05-15 DIAGNOSIS — C7982 Secondary malignant neoplasm of genital organs: Secondary | ICD-10-CM | POA: Diagnosis not present

## 2019-05-15 DIAGNOSIS — Z8249 Family history of ischemic heart disease and other diseases of the circulatory system: Secondary | ICD-10-CM | POA: Insufficient documentation

## 2019-05-15 DIAGNOSIS — M1990 Unspecified osteoarthritis, unspecified site: Secondary | ICD-10-CM | POA: Insufficient documentation

## 2019-05-15 DIAGNOSIS — Z79899 Other long term (current) drug therapy: Secondary | ICD-10-CM | POA: Insufficient documentation

## 2019-05-15 DIAGNOSIS — C541 Malignant neoplasm of endometrium: Secondary | ICD-10-CM | POA: Diagnosis not present

## 2019-05-15 DIAGNOSIS — E039 Hypothyroidism, unspecified: Secondary | ICD-10-CM | POA: Insufficient documentation

## 2019-05-15 DIAGNOSIS — C55 Malignant neoplasm of uterus, part unspecified: Secondary | ICD-10-CM

## 2019-05-15 HISTORY — PX: IR IMAGING GUIDED PORT INSERTION: IMG5740

## 2019-05-15 LAB — CBC WITH DIFFERENTIAL/PLATELET
Abs Immature Granulocytes: 0.02 10*3/uL (ref 0.00–0.07)
Basophils Absolute: 0 10*3/uL (ref 0.0–0.1)
Basophils Relative: 1 %
Eosinophils Absolute: 0.2 10*3/uL (ref 0.0–0.5)
Eosinophils Relative: 3 %
HCT: 44.4 % (ref 36.0–46.0)
Hemoglobin: 14.6 g/dL (ref 12.0–15.0)
Immature Granulocytes: 0 %
Lymphocytes Relative: 27 %
Lymphs Abs: 2 10*3/uL (ref 0.7–4.0)
MCH: 30.3 pg (ref 26.0–34.0)
MCHC: 32.9 g/dL (ref 30.0–36.0)
MCV: 92.1 fL (ref 80.0–100.0)
Monocytes Absolute: 0.6 10*3/uL (ref 0.1–1.0)
Monocytes Relative: 7 %
Neutro Abs: 4.6 10*3/uL (ref 1.7–7.7)
Neutrophils Relative %: 62 %
Platelets: 274 10*3/uL (ref 150–400)
RBC: 4.82 MIL/uL (ref 3.87–5.11)
RDW: 13 % (ref 11.5–15.5)
WBC: 7.5 10*3/uL (ref 4.0–10.5)
nRBC: 0 % (ref 0.0–0.2)

## 2019-05-15 LAB — BASIC METABOLIC PANEL
Anion gap: 10 (ref 5–15)
BUN: 12 mg/dL (ref 8–23)
CO2: 22 mmol/L (ref 22–32)
Calcium: 8.8 mg/dL — ABNORMAL LOW (ref 8.9–10.3)
Chloride: 105 mmol/L (ref 98–111)
Creatinine, Ser: 0.89 mg/dL (ref 0.44–1.00)
GFR calc Af Amer: >60 mL/min (ref >60)
GFR calc non Af Amer: >60 mL/min (ref >60)
Glucose, Bld: 96 mg/dL (ref 70–99)
Potassium: 3.7 mmol/L (ref 3.5–5.1)
Sodium: 137 mmol/L (ref 135–145)

## 2019-05-15 LAB — PROTIME-INR
INR: 0.9 (ref 0.8–1.2)
Prothrombin Time: 11.6 seconds (ref 11.4–15.2)

## 2019-05-15 MED ORDER — LIDOCAINE HCL (PF) 1 % IJ SOLN
INTRAMUSCULAR | Status: AC | PRN
  Administered 2019-05-15: 10 mL

## 2019-05-15 MED ORDER — CLINDAMYCIN PHOSPHATE 900 MG/50ML IV SOLN
900.0000 mg | Freq: Once | INTRAVENOUS | Status: AC
  Administered 2019-05-15: 900 mg via INTRAVENOUS
  Filled 2019-05-15: qty 50

## 2019-05-15 MED ORDER — LIDOCAINE-EPINEPHRINE 1 %-1:100000 IJ SOLN
INTRAMUSCULAR | Status: AC | PRN
  Administered 2019-05-15: 10 mL

## 2019-05-15 MED ORDER — LIDOCAINE HCL 1 % IJ SOLN
INTRAMUSCULAR | Status: AC
  Filled 2019-05-15: qty 20

## 2019-05-15 MED ORDER — HEPARIN SOD (PORK) LOCK FLUSH 100 UNIT/ML IV SOLN
INTRAVENOUS | Status: AC
  Filled 2019-05-15: qty 5

## 2019-05-15 MED ORDER — FENTANYL CITRATE (PF) 100 MCG/2ML IJ SOLN
INTRAMUSCULAR | Status: AC
  Filled 2019-05-15: qty 2

## 2019-05-15 MED ORDER — MIDAZOLAM HCL 2 MG/2ML IJ SOLN
INTRAMUSCULAR | Status: AC | PRN
  Administered 2019-05-15 (×3): 1 mg via INTRAVENOUS

## 2019-05-15 MED ORDER — FENTANYL CITRATE (PF) 100 MCG/2ML IJ SOLN
INTRAMUSCULAR | Status: AC | PRN
  Administered 2019-05-15 (×2): 50 ug via INTRAVENOUS

## 2019-05-15 MED ORDER — LIDOCAINE-EPINEPHRINE 1 %-1:100000 IJ SOLN
INTRAMUSCULAR | Status: AC
  Filled 2019-05-15: qty 1

## 2019-05-15 MED ORDER — SODIUM CHLORIDE 0.9 % IV SOLN: INTRAVENOUS | Status: DC

## 2019-05-15 MED ORDER — HEPARIN SOD (PORK) LOCK FLUSH 100 UNIT/ML IV SOLN
INTRAVENOUS | Status: AC | PRN
  Administered 2019-05-15: 500 [IU] via INTRAVENOUS

## 2019-05-15 MED ORDER — MIDAZOLAM HCL 2 MG/2ML IJ SOLN
INTRAMUSCULAR | Status: AC
  Filled 2019-05-15: qty 4

## 2019-05-15 NOTE — Discharge Instructions (Addendum)
Moderate Conscious Sedation, Adult, Care After °These instructions provide you with information about caring for yourself after your procedure. Your health care provider may also give you more specific instructions. Your treatment has been planned according to current medical practices, but problems sometimes occur. Call your health care provider if you have any problems or questions after your procedure. °What can I expect after the procedure? °After your procedure, it is common: °· To feel sleepy for several hours. °· To feel clumsy and have poor balance for several hours. °· To have poor judgment for several hours. °· To vomit if you eat too soon. °Follow these instructions at home: °For at least 24 hours after the procedure: ° °· Do not: °? Participate in activities where you could fall or become injured. °? Drive. °? Use heavy machinery. °? Drink alcohol. °? Take sleeping pills or medicines that cause drowsiness. °? Make important decisions or sign legal documents. °? Take care of children on your own. °· Rest. °Eating and drinking °· Follow the diet recommended by your health care provider. °· If you vomit: °? Drink water, juice, or soup when you can drink without vomiting. °? Make sure you have little or no nausea before eating solid foods. °General instructions °· Have a responsible adult stay with you until you are awake and alert. °· Take over-the-counter and prescription medicines only as told by your health care provider. °· If you smoke, do not smoke without supervision. °· Keep all follow-up visits as told by your health care provider. This is important. °Contact a health care provider if: °· You keep feeling nauseous or you keep vomiting. °· You feel light-headed. °· You develop a rash. °· You have a fever. °Get help right away if: °· You have trouble breathing. °This information is not intended to replace advice given to you by your health care provider. Make sure you discuss any questions you have  with your health care provider. °Document Released: 04/23/2013 Document Revised: 06/15/2017 Document Reviewed: 10/23/2015 °Elsevier Patient Education © 2020 Else[employer] Inc[employer] °Implanted Port Insertion, Care After °This sheet gives you information about how to care for yourself after your procedure. Your health care provider may also give you more specific instructions. If you have problems or questions, contact your health care provider. °What can I expect after the procedure? °After the procedure, it is common to have: °· Discomfort at the port insertion site. °· Bruising on the skin over the port. This should improve over 3-4 days. °Follow these instructions at home: °Port care °· After your port is placed, you will get a manufacturer's information card. The card has information about your port. Keep this card with you at all times. °· Take care of the port as told by your health care provider. Ask your health care provider if you or a family member can get training for taking care of the port at home. A home health care nurse may also take care of the port. °· Make sure to remember what type of port you have. °Incision care ° °  ° °· Follow instructions from your health care provider about how to take care of your port insertion site. Make sure you: °? Wash your hands with soap and water before and after you change your bandage (dressing). If soap and water are not available, use hand sanitizer. °? Change your dressing as told by your health care provider. °? Leave stitches (sutures), skin glue, or adhesive strips in place. These skin closures may   need to stay in place for 2 weeks or longer. If adhesive strip edges start to loosen and curl up, you may trim the loose edges. Do not remove adhesive strips completely unless your health care provider tells you to do that. °· Check your port insertion site every day for signs of infection. Check for: °? Redness, swelling, or pain. °? Fluid or blood. °? Warmth. °? Pus or a  bad smell. °Activity °· Return to your normal activities as told by your health care provider. Ask your health care provider what activities are safe for you. °· Do not lift anything that is heavier than 10 lb (4.5 kg), or the limit that you are told, until your health care provider says that it is safe. °General instructions °· Take over-the-counter and prescription medicines only as told by your health care provider. °· Do not take baths, swim, or use a hot tub until your health care provider approves. Ask your health care provider if you may take showers. You may only be allowed to take sponge baths. °· Do not drive for 24 hours if you were given a sedative during your procedure. °· Wear a medical alert bracelet in case of an emergency. This will tell any health care providers that you have a port. °· Keep all follow-up visits as told by your health care provider. This is important. °Contact a health care provider if: °· You cannot flush your port with saline as directed, or you cannot draw blood from the port. °· You have a fever or chills. °· You have redness, swelling, or pain around your port insertion site. °· You have fluid or blood coming from your port insertion site. °· Your port insertion site feels warm to the touch. °· You have pus or a bad smell coming from the port insertion site. °Get help right away if: °· You have chest pain or shortness of breath. °· You have bleeding from your port that you cannot control. °Summary °· Take care of the port as told by your health care provider. Keep the manufacturer's information card with you at all times. °· Change your dressing as told by your health care provider. °· Contact a health care provider if you have a fever or chills or if you have redness, swelling, or pain around your port insertion site. °· Keep all follow-up visits as told by your health care provider. °This information is not intended to replace advice given to you by your health care  provider. Make sure you discuss any questions you have with your health care provider. °Document Released: 04/23/2013 Document Revised: 01/29/2018 Document Reviewed: 01/29/2018 °Elsevier Patient Education © 2020 Elsevier Inc. ° ° °

## 2019-05-15 NOTE — Procedures (Signed)
Interventional Radiology Procedure:   Indications: Metastatic endometrial cancer  Procedure: Port placement  Findings: Right jugular port, tip in lower SVC.  Complications: None     EBL: Minimal, less than 10 ml  Plan: Discharge in one hour.  Keep port site and incisions dry for at least 24 hours.     Lanetra Hartley R. Anselm Pancoast, MD  Pager: [phone]

## 2019-05-15 NOTE — H&P (Signed)
Chief Complaint: Patient was seen in consultation today for port placement at the request of [place]  Referring Physician(s): [place]  Supervising Physician: Markus Daft  Patient Status: [facility] Out[facility]  History of Present Illness: Dana [name] is a 68 y.o. female with recurrent metastatic endometrial cancer. She is to have chemotherapy and is referred for port placement. She states she has had a right sided port in the past, removed in 2016. PMHx, meds, labs, imaging, allergies reviewed. Feels well, no recent fevers, chills, illness. Has been NPO today as directed.   Past Medical History:  Diagnosis Date  . Arthritis   . GERD (gastroesophageal reflux disease)   . History of blood transfusion   . Hypothyroidism   . Lymphedema    left leg- ted hose makes it worse  . Thyroid disease   . Uterine cancer [facility])    Endometrial cancer - surgery and chemo    Past Surgical History:  Procedure Laterality Date  . ABDOMINAL HYSTERECTOMY    . APPENDECTOMY    . COLONOSCOPY W/ POLYPECTOMY    . EYE SURGERY Bilateral    cataracts  . LIVER BIOPSY    . mass removed     2015 from pelvis  . TOTAL HIP ARTHROPLASTY Right 07/13/2016   Procedure: RIGHT TOTAL HIP ARTHROPLASTY ANTERIOR APPROACH;  Surgeon: Leandrew Koyanagi, MD;  Location: [place];  Service: Orthopedics;  Laterality: Right;    Allergies: Oxycodone, Garlic, Other, and Penicillins  Medications: Prior to Admission medications   Medication Sig Start Date End Date Taking? Authorizing Provider  amLODipine (NORVASC) 5 MG tablet Take 5 mg by mouth daily.  01/26/19  Yes [provider]  ibuprofen (ADVIL) 200 MG tablet Take 200 mg by mouth every 6 (six) hours as needed.   Yes [provider]  lidocaine-prilocaine (EMLA) cream Apply to affected area once 05/12/19   [name], MD  ondansetron (ZOFRAN) 8 MG tablet Take 1 tablet (8 mg total) by mouth every 8 (eight) hours as needed. 05/12/19   [name], MD   prochlorperazine (COMPAZINE) 10 MG tablet Take 1 tablet (10 mg total) by mouth every 6 (six) hours as needed (Nausea or vomiting). 05/12/19   [name], MD  traMADol (ULTRAM) 50 MG tablet TK 1 T PO  BID PRF PAINS. 03/31/19   [provider]  promethazine (PHENERGAN) 25 MG tablet Take 1 tablet (25 mg total) by mouth every 6 (six) hours as needed for nausea. Patient not taking: Reported on 08/24/2016 07/13/16 03/26/19  Leandrew Koyanagi, MD     Family History  Problem Relation Age of Onset  . Heart disease Mother   . Multiple myeloma Mother     Social History   Socioeconomic History  . Marital status: Single    Spouse name: Not on file  . Number of children: Not on file  . Years of education: Not on file  . Highest education level: Not on file  Occupational History  . Occupation: Armed forces technical officer  Social Needs  . Financial resource strain: Not on file  . Food insecurity    Worry: Not on file    Inability: Not on file  . Transportation needs    Medical: Not on file    Non-medical: Not on file  Tobacco Use  . Smoking status: Never Smoker  . Smokeless tobacco: Never Used  Substance and Sexual Activity  . Alcohol use: No    Alcohol/week: 0.0 standard drinks  . Drug use: No  .  Sexual activity: Not on file  Lifestyle  . Physical activity    Days per week: Not on file    Minutes per session: Not on file  . Stress: Not on file  Relationships  . Social Herbalist on phone: Not on file    Gets together: Not on file    Attends religious service: Not on file    Active member of club or organization: Not on file    Attends meetings of clubs or organizations: Not on file    Relationship status: Not on file  Other Topics Concern  . Not on file  Social History Narrative  . Not on file    Review of Systems: A 12 point ROS discussed and pertinent positives are indicated in the HPI above.  All other systems are negative.  Review of Systems  Vital Signs: BP  (!) 152/89   Pulse 95   Temp 98.7 F (37.1 C) (Oral)   Resp 16   SpO2 97%   Physical Exam Constitutional:      Appearance: Normal appearance.  HENT:     Mouth/Throat:     Mouth: Mucous membranes are moist.     Pharynx: Oropharynx is clear.  Cardiovascular:     Rate and Rhythm: Normal rate and regular rhythm.     Heart sounds: Normal heart sounds.  Pulmonary:     Effort: Pulmonary effort is normal. No respiratory distress.     Breath sounds: Normal breath sounds.  Skin:    General: Skin is warm.  Neurological:     General: No focal deficit present.     Mental Status: She is alert and oriented to person, place, and time.  Psychiatric:        Mood and Affect: Mood normal.        Judgment: Judgment normal.       Imaging: Nm Pet Image Initial (pi) Skull Base To Thigh  Result Date: 04/22/2019 CLINICAL DATA:  Initial treatment strategy for uterine carcinoma. EXAM: NUCLEAR MEDICINE PET SKULL BASE TO THIGH TECHNIQUE: 9.6 mCi F-18 FDG was injected intravenously. Full-ring PET imaging was performed from the skull base to thigh after the radiotracer. CT data was obtained and used for attenuation correction and anatomic localization. Fasting blood glucose: 96 mg/dl COMPARISON:  6 FINDINGS: Mediastinal blood pool activity: SUV max 2.79 Liver activity: SUV max NA NECK: No hypermetabolic lymph nodes in the neck. Incidental CT findings: none CHEST: No hypermetabolic mediastinal or hilar nodes. No suspicious pulmonary nodules on the CT scan. Incidental CT findings: none ABDOMEN/PELVIS: Post hysterectomy anatomy. Two small intensely hypermetabolic nodes in the RIGHT pelvis. Node in the deep perirectal fat along the piriformis muscle measures 9 mm (image 166/4) on the RIGHT with SUV max equal 12.7. Small RIGHT external iliac lymph node measuring 8 mm (image 161/4) with SUV max equal 9.2. Larger lobular node in the RIGHT inguinal nodal station measuring 3.6 x 3.4 cm SUV max equal 9.2. Focus of  intenseradiotracer activity in the RIGHT operator space is favored an extension of the bladder. No evidence of metastatic adenopathy outside the pelvis. No abnormal activity in liver. Incidental CT findings: none SKELETON: No focal hypermetabolic activity to suggest skeletal metastasis. Incidental CT findings: none IMPRESSION: 1. Two hypermetabolic RIGHT iliac lymph nodes and larger metastatic RIGHT inguinal lymph node. 2. No evidence of metastatic disease outside the pelvis. 3. No clear evidence of activity at the vaginal cuff. Electronically Signed   By: Helane Gunther.D.  On: 04/22/2019 16:22    Labs:  CBC: Recent Labs    04/10/19 1101 05/15/19 1156  WBC 6.8 7.5  HGB 15.3* 14.6  HCT 47.0* 44.4  PLT 292 274    COAGS: Recent Labs    04/10/19 1101 05/15/19 1156  INR 1.0 0.9    BMP: Recent Labs    05/15/19 1156  NA 137  K 3.7  CL 105  CO2 22  GLUCOSE 96  BUN 12  CALCIUM 8.8*  CREATININE 0.89  GFRNONAA >60  GFRAA >60    LIVER FUNCTION TESTS: No results for input(s): BILITOT, AST, ALT, ALKPHOS, PROT, ALBUMIN in the last 8760 hours.  TUMOR MARKERS: No results for input(s): AFPTM, CEA, CA199, CHROMGRNA in the last 8760 hours.  Assessment and Plan: Metastatic endometrial cancer For port  Labs ok Risks and benefits of image guided port-a-catheter placement was discussed with the patient including, but not limited to bleeding, infection, pneumothorax, or fibrin sheath development and need for additional procedures.  All of the patient's questions were answered, patient is agreeable to proceed. Consent signed and in chart.    Thank you for this interesting consult.  I greatly enjoyed meeting JADIN KAGEL and look forward to participating in their care.  A copy of this report was sent to the requesting provider on this date.  Electronically Signed: Ascencion Dike, PA-C 05/15/2019, 12:36 PM   I spent a total of 20 minutes in face to face in clinical  consultation, greater than 50% of which was counseling/coordinating care for port

## 2019-05-15 NOTE — Progress Notes (Signed)
Met with patient at registration to introduce myself as Arboriculturist and to offer available resources.  Discussed one-time $1000 Radio broadcast assistant to assist with personal expenses while going through treatment.  Gave her my card if interested in applying and for any additional financial questions or concerns.

## 2019-05-16 ENCOUNTER — Telehealth: Payer: Self-pay | Admitting: Oncology

## 2019-05-16 ENCOUNTER — Encounter (HOSPITAL_COMMUNITY): Payer: Self-pay | Admitting: Hematology and Oncology

## 2019-05-16 ENCOUNTER — Inpatient Hospital Stay: Payer: 59

## 2019-05-16 ENCOUNTER — Other Ambulatory Visit: Payer: Self-pay

## 2019-05-16 VITALS — BP 121/84 | HR 82 | Temp 97.8°F | Resp 18

## 2019-05-16 DIAGNOSIS — Z5111 Encounter for antineoplastic chemotherapy: Secondary | ICD-10-CM | POA: Diagnosis not present

## 2019-05-16 DIAGNOSIS — C55 Malignant neoplasm of uterus, part unspecified: Secondary | ICD-10-CM

## 2019-05-16 DIAGNOSIS — Z7189 Other specified counseling: Secondary | ICD-10-CM

## 2019-05-16 MED ORDER — HEPARIN SOD (PORK) LOCK FLUSH 100 UNIT/ML IV SOLN
500.0000 [IU] | Freq: Once | INTRAVENOUS | Status: AC | PRN
  Administered 2019-05-16: 500 [IU]
  Filled 2019-05-16: qty 5

## 2019-05-16 MED ORDER — PALONOSETRON HCL INJECTION 0.25 MG/5ML
0.2500 mg | Freq: Once | INTRAVENOUS | Status: AC
  Administered 2019-05-16: 0.25 mg via INTRAVENOUS

## 2019-05-16 MED ORDER — SODIUM CHLORIDE 0.9 % IV SOLN
50.0000 mg/m2 | Freq: Once | INTRAVENOUS | Status: AC
  Administered 2019-05-16: 94 mg via INTRAVENOUS
  Filled 2019-05-16: qty 94

## 2019-05-16 MED ORDER — SODIUM CHLORIDE 0.9 % IV SOLN
Freq: Once | INTRAVENOUS | Status: AC
  Administered 2019-05-16: 11:00:00 via INTRAVENOUS
  Filled 2019-05-16: qty 5

## 2019-05-16 MED ORDER — PALONOSETRON HCL INJECTION 0.25 MG/5ML
INTRAVENOUS | Status: AC
  Filled 2019-05-16: qty 5

## 2019-05-16 MED ORDER — SODIUM CHLORIDE 0.9% FLUSH
10.0000 mL | INTRAVENOUS | Status: DC | PRN
  Administered 2019-05-16: 10 mL
  Filled 2019-05-16: qty 10

## 2019-05-16 MED ORDER — POTASSIUM CHLORIDE 2 MEQ/ML IV SOLN
Freq: Once | INTRAVENOUS | Status: AC
  Administered 2019-05-16: 09:00:00 via INTRAVENOUS
  Filled 2019-05-16: qty 10

## 2019-05-16 MED ORDER — SODIUM CHLORIDE 0.9 % IV SOLN
Freq: Once | INTRAVENOUS | Status: AC
  Administered 2019-05-16: 09:00:00 via INTRAVENOUS
  Filled 2019-05-16: qty 250

## 2019-05-16 NOTE — Patient Instructions (Signed)
[facility] Discharge Instructions for Patients Receiving Chemotherapy  Today you received the following chemotherapy agents Cisplatin (PLATINOL).  To help prevent nausea and vomiting after your treatment, we encourage you to take your nausea medication as prescribed.   If you develop nausea and vomiting that is not controlled by your nausea medication, call the clinic.   BELOW ARE SYMPTOMS THAT SHOULD BE REPORTED IMMEDIATELY:  *FEVER GREATER THAN 100.5 F  *CHILLS WITH OR WITHOUT FEVER  NAUSEA AND VOMITING THAT IS NOT CONTROLLED WITH YOUR NAUSEA MEDICATION  *UNUSUAL SHORTNESS OF BREATH  *UNUSUAL BRUISING OR BLEEDING  TENDERNESS IN MOUTH AND THROAT WITH OR WITHOUT PRESENCE OF ULCERS  *URINARY PROBLEMS  *BOWEL PROBLEMS  UNUSUAL RASH Items with * indicate a potential emergency and should be followed up as soon as possible.  Feel free to call the clinic should you have any questions or concerns. The clinic phone number is [phone].  Please show the CHEMO ALERT CARD at check-in to the Emergency Department and triage nurse.  Cisplatin injection What is this medicine? CISPLATIN (SIS pla tin) is a chemotherapy drug. It targets fast dividing cells, like cancer cells, and causes these cells to die. This medicine is used to treat many types of cancer like bladder, ovarian, and testicular cancers. This medicine may be used for other purposes; ask your health care provider or pharmacist if you have questions. COMMON BRAND NAME(S): Platinol, Platinol -AQ What should I tell my health care provider before I take this medicine? They need to know if you have any of these conditions:  blood disorders  hearing problems  kidney disease  recent or ongoing radiation therapy  an unusual or allergic reaction to cisplatin, carboplatin, other chemotherapy, other medicines, foods, dyes, or preservatives  pregnant or trying to get pregnant  breast-feeding How should I  use this medicine? This drug is given as an infusion into a vein. It is administered in a hospital or clinic by a specially trained health care professional. Talk to your pediatrician regarding the use of this medicine in children. Special care may be needed. Overdosage: If you think you have taken too much of this medicine contact a poison control center or emergency room at once. NOTE: This medicine is only for you. Do not share this medicine with others. What if I miss a dose? It is important not to miss a dose. Call your doctor or health care professional if you are unable to keep an appointment. What may interact with this medicine?  dofetilide  foscarnet  medicines for seizures  medicines to increase blood counts like filgrastim, pegfilgrastim, sargramostim  probenecid  pyridoxine used with altretamine  rituximab  some antibiotics like amikacin, gentamicin, neomycin, polymyxin B, streptomycin, tobramycin  sulfinpyrazone  vaccines  zalcitabine Talk to your doctor or health care professional before taking any of these medicines:  acetaminophen  aspirin  ibuprofen  ketoprofen  naproxen This list may not describe all possible interactions. Give your health care provider a list of all the medicines, herbs, non-prescription drugs, or dietary supplements you use. Also tell them if you smoke, drink alcohol, or use illegal drugs. Some items may interact with your medicine. What should I watch for while using this medicine? Your condition will be monitored carefully while you are receiving this medicine. You will need important blood work done while you are taking this medicine. This drug may make you feel generally unwell. This is not uncommon, as chemotherapy can affect healthy cells as well   as cancer cells. Report any side effects. Continue your course of treatment even though you feel ill unless your doctor tells you to stop. In some cases, you may be given additional  medicines to help with side effects. Follow all directions for their use. Call your doctor or health care professional for advice if you get a fever, chills or sore throat, or other symptoms of a cold or flu. Do not treat yourself. This drug decreases your body's ability to fight infections. Try to avoid being around people who are sick. This medicine may increase your risk to bruise or bleed. Call your doctor or health care professional if you notice any unusual bleeding. Be careful brushing and flossing your teeth or using a toothpick because you may get an infection or bleed more easily. If you have any dental work done, tell your dentist you are receiving this medicine. Avoid taking products that contain aspirin, acetaminophen, ibuprofen, naproxen, or ketoprofen unless instructed by your doctor. These medicines may hide a fever. Do not become pregnant while taking this medicine. Women should inform their doctor if they wish to become pregnant or think they might be pregnant. There is a potential for serious side effects to an unborn child. Talk to your health care professional or pharmacist for more information. Do not breast-feed an infant while taking this medicine. Drink fluids as directed while you are taking this medicine. This will help protect your kidneys. Call your doctor or health care professional if you get diarrhea. Do not treat yourself. What side effects may I notice from receiving this medicine? Side effects that you should report to your doctor or health care professional as soon as possible:  allergic reactions like skin rash, itching or hives, swelling of the face, lips, or tongue  signs of infection - fever or chills, cough, sore throat, pain or difficulty passing urine  signs of decreased platelets or bleeding - bruising, pinpoint red spots on the skin, black, tarry stools, nosebleeds  signs of decreased red blood cells - unusually weak or tired, fainting spells,  lightheadedness  breathing problems  changes in hearing  gout pain  low blood counts - This drug may decrease the number of white blood cells, red blood cells and platelets. You may be at increased risk for infections and bleeding.  nausea and vomiting  pain, swelling, redness or irritation at the injection site  pain, tingling, numbness in the hands or feet  problems with balance, movement  trouble passing urine or change in the amount of urine Side effects that usually do not require medical attention (report to your doctor or health care professional if they continue or are bothersome):  changes in vision  loss of appetite  metallic taste in the mouth or changes in taste This list may not describe all possible side effects. Call your doctor for medical advice about side effects. You may report side effects to FDA at 1-800-FDA-1088. Where should I keep my medicine? This drug is given in a hospital or clinic and will not be stored at home. NOTE: This sheet is a summary. It may not cover all possible information. If you have questions about this medicine, talk to your doctor, pharmacist, or health care provider.  2020 Elsevier/Gold Standard (2007-10-08 14:40:54)  Coronavirus (COVID-19) Are you at risk?  Are you at risk for the Coronavirus (COVID-19)?  To be considered HIGH RISK for Coronavirus (COVID-19), you have to meet the following criteria:  . Traveled to [place], [place], [place],   [place] or [place]; or in the [place] to [place], [place], [place], or [place]; and have fever, cough, and shortness of breath within the last 2 weeks of travel OR . Been in close contact with a person diagnosed with COVID-19 within the last 2 weeks and have fever, cough, and shortness of breath . IF YOU DO NOT MEET THESE CRITERIA, YOU ARE CONSIDERED LOW RISK FOR COVID-19.  What to do if you are HIGH RISK for COVID-19?  . If you are having a medical emergency, call 911. . Seek  medical care right away. Before you go to a doctor's office, urgent care or emergency department, call ahead and tell them about your recent travel, contact with someone diagnosed with COVID-19, and your symptoms. You should receive instructions from your physician's office regarding next steps of care.  . When you arrive at healthcare provider, tell the healthcare staff immediately you have returned from visiting [place], [place], [place], [place] or [place]; or traveled in the [place] to [place], [place], [place], or [place]; in the last two weeks or you have been in close contact with a person diagnosed with COVID-19 in the last 2 weeks.   . Tell the health care staff about your symptoms: fever, cough and shortness of breath. . After you have been seen by a medical provider, you will be either: o Tested for (COVID-19) and discharged home on quarantine except to seek medical care if symptoms worsen, and asked to  - Stay home and avoid contact with others until you get your results (4-5 days)  - Avoid travel on public transportation if possible (such as bus, train, or airplane) or o Sent to the Emergency Department by EMS for evaluation, COVID-19 testing, and possible admission depending on your condition and test results.  What to do if you are LOW RISK for COVID-19?  Reduce your risk of any infection by using the same precautions used for avoiding the common cold or flu:  . Wash your hands often with soap and warm water for at least 20 seconds.  If soap and water are not readily available, use an alcohol-based hand sanitizer with at least 60% alcohol.  . If coughing or sneezing, cover your mouth and nose by coughing or sneezing into the elbow areas of your shirt or coat, into a tissue or into your sleeve (not your hands). . Avoid shaking hands with others and consider head nods or verbal greetings only. . Avoid touching your eyes, nose, or mouth with unwashed hands.  . Avoid close  contact with people who are sick. . Avoid places or events with large numbers of people in one location, like concerts or sporting events. . Carefully consider travel plans you have or are making. . If you are planning any travel outside or inside the [place], visit the CDC's Travelers' Health webpage for the latest health notices. . If you have some symptoms but not all symptoms, continue to monitor at home and seek medical attention if your symptoms worsen. . If you are having a medical emergency, call 911.   ADDITIONAL HEALTHCARE OPTIONS FOR PATIENTS  [facility] / e-Visit: [email or web address]         [facility]: [phone]  [facility]: [phone]                   [facility]: [phone]   

## 2019-05-16 NOTE — Telephone Encounter (Signed)
Dana [name] asked if her FMLA/Disability paperwork has been sent out yet.  Advised her that it went to Dr. Alvy Bimler to sign today and we will notifiy her when it is faxed.  She also asked when her appointment for radiation is on 05/21/19 because it is not showing up on her calendars.  Advised her it will be at 4:30 on 05/21/19.  She verbalized agreement.

## 2019-05-19 ENCOUNTER — Other Ambulatory Visit: Payer: Self-pay

## 2019-05-19 ENCOUNTER — Ambulatory Visit
Admission: RE | Admit: 2019-05-19 | Discharge: 2019-05-19 | Disposition: A | Discharge status: Home / Self Care | Payer: 59 | Source: Ambulatory Visit | Attending: Radiation Oncology | Admitting: Radiation Oncology

## 2019-05-19 DIAGNOSIS — C541 Malignant neoplasm of endometrium: Secondary | ICD-10-CM | POA: Insufficient documentation

## 2019-05-19 DIAGNOSIS — C55 Malignant neoplasm of uterus, part unspecified: Secondary | ICD-10-CM

## 2019-05-19 NOTE — Progress Notes (Signed)
  Radiation Oncology         [phone] ________________________________  Name: Dana [name] MRN: [id number]  Date: 05/19/2019  DOB: 1950/12/23  Simulation Verification Note    ICD-10-CM   1. 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 The clinical setup was verified. Then port films were obtained and uploaded to the radiation oncology medical record software.  The treatment beams were carefully compared against the planned radiation fields. The position location and shape of the radiation fields was reviewed. They targeted volume of tissue appears to be appropriately covered by the radiation beams. Organs at risk appear to be excluded as planned.  Based on my personal review, I approved the simulation verification. The patient's treatment will proceed as planned.  -----------------------------------  Blair Promise, PhD, MD

## 2019-05-20 ENCOUNTER — Ambulatory Visit: Payer: 59

## 2019-05-20 ENCOUNTER — Encounter (HOSPITAL_COMMUNITY): Payer: Self-pay | Admitting: Hematology and Oncology

## 2019-05-21 ENCOUNTER — Ambulatory Visit
Admission: RE | Admit: 2019-05-21 | Discharge: 2019-05-21 | Disposition: A | Discharge status: Home / Self Care | Payer: 59 | Source: Ambulatory Visit | Attending: Radiation Oncology | Admitting: Radiation Oncology

## 2019-05-21 ENCOUNTER — Ambulatory Visit: Payer: 59 | Attending: Radiation Oncology

## 2019-05-21 ENCOUNTER — Ambulatory Visit: Payer: 59 | Admitting: Radiation Oncology

## 2019-05-21 ENCOUNTER — Other Ambulatory Visit: Payer: Self-pay

## 2019-05-21 DIAGNOSIS — C774 Secondary and unspecified malignant neoplasm of inguinal and lower limb lymph nodes: Secondary | ICD-10-CM | POA: Diagnosis present

## 2019-05-21 DIAGNOSIS — R293 Abnormal posture: Secondary | ICD-10-CM

## 2019-05-21 DIAGNOSIS — I890 Lymphedema, not elsewhere classified: Secondary | ICD-10-CM

## 2019-05-21 DIAGNOSIS — C55 Malignant neoplasm of uterus, part unspecified: Secondary | ICD-10-CM

## 2019-05-21 DIAGNOSIS — M79605 Pain in left leg: Secondary | ICD-10-CM | POA: Diagnosis present

## 2019-05-21 DIAGNOSIS — C541 Malignant neoplasm of endometrium: Secondary | ICD-10-CM | POA: Diagnosis not present

## 2019-05-21 NOTE — Therapy (Signed)
[place], [place], [address] Phone: [phone]   Fax:  [phone]  Physical Therapy Treatment  Patient Details  Name: Dana [name] MRN: [id number] Date of Birth: 19-Sep-1950 Referring Provider (PT): Gery Pray   Encounter Date: 05/21/2019  PT End of Session - 05/21/19 1514    Visit Number  4    Number of Visits  15    Date for PT Re-Evaluation  06/18/19    PT Start Time  1509    PT Stop Time  1602    PT Time Calculation (min)  53 min    Activity Tolerance  Patient tolerated treatment well    Behavior During Therapy  [facility] for tasks assessed/performed       Past Medical History:  Diagnosis Date  . Arthritis   . GERD (gastroesophageal reflux disease)   . History of blood transfusion   . Hypothyroidism   . Lymphedema    left leg- ted hose makes it worse  . Thyroid disease   . Uterine cancer [facility])    Endometrial cancer - surgery and chemo    Past Surgical History:  Procedure Laterality Date  . ABDOMINAL HYSTERECTOMY    . APPENDECTOMY    . COLONOSCOPY W/ POLYPECTOMY    . EYE SURGERY Bilateral    cataracts  . IR IMAGING GUIDED PORT INSERTION  05/15/2019  . LIVER BIOPSY    . mass removed     2015 from pelvis  . TOTAL HIP ARTHROPLASTY Right 07/13/2016   Procedure: RIGHT TOTAL HIP ARTHROPLASTY ANTERIOR APPROACH;  Surgeon: Leandrew Koyanagi, MD;  Location: [facility];  Service: Orthopedics;  Laterality: Right;    There were no vitals filed for this visit.  Subjective Assessment - 05/21/19 1509    Subjective  Pt reports that she has not been able to do much wrapping becuase she has felt very sick from the chemotherapy and the radiation. She has been very fatigued.    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  5 minutes    Patient Stated Goals  I want to be able to walk  again.    Currently in Pain?  No/denies    Pain Score  0-No pain    Pain Location  --   3/10 pain at catheter site in the neck   Aggravating Factors   standing, walking    Pain Relieving Factors  elevating legs.                  Outpatient Re[facility]  Lymphedema Life Impact Scale Total Score  70.59 %           [facility] Adult PT Treatment/Exercise - 05/21/19 0001      Manual Therapy   Manual Therapy  Compression Bandaging;Manual Lymphatic Drainage (MLD)    Manual Lymphatic Drainage (MLD)  In supine, Deep abdominals, Short neck, Bil axillary nodes, Bil inguinal nodes, Bil inguino-axillary anastomosis, LLE medial-lateral, lateral, posterior, medial knee, posterior knee, all surface of the lower leg, medial/lateral ankle then re-worked all surfaces and 5 diaphragmatic breathes.     Compression Bandaging  Pt was wrapped with TG soft base layer, 1x 2 inch folded elastomull at the toes, 3 artiflex for shaping, 1x 6 cm for roman sandal, 1x 8 cm for ankle-sole-heel, 2x 10 cm and  1 x 12 cm spiral pattern upward from foot to groin.              PT Education - 05/21/19 1604    Education Details  Pt was educated on reasons to remove bandages and on care of bandages. See instructions.    Person(s) Educated  Patient    Methods  Explanation    Comprehension  Verbalized understanding       PT Short Term Goals - 04/30/19 1015      PT SHORT TERM GOAL #1   Title  Pt will be independent with lymphatic facilitation exercises within 2 weeks.    Baseline  Pt did not have exercises    Time  2    Period  Weeks    Status  New    Target Date  05/21/19      PT SHORT TERM GOAL #2   Title  Pt will be independent with self MLD to demonstrate autonomy of care    Baseline  Pt does not know how to perform MLD    Time  2    Period  Weeks    Status  New    Target Date  05/21/19        PT Long Term Goals - 04/30/19 1017       PT LONG TERM GOAL #1   Title  Pt will report 1/10 pain or less within 6 weeks in order to promote mobility and increase gait ability within    Baseline  3/10 pain in LLE    Time  6    Period  Weeks    Status  New    Target Date  06/18/19      PT LONG TERM GOAL #2   Title  Patient will have reduction of LLE limb  girth by 3 cm or greater and RLE lower leg limb girth by atleast 2 cm within 6 weeks to demonstrate decrease lymphatic build up and decrease risk for infection.    Time  6    Period  Weeks    Status  New    Target Date  06/18/19      PT LONG TERM GOAL #3   Title  Patient to be properly fitted with compression garment to wear on daily basis    Baseline  pt has knee high circular knit compression but requires flat knee thigh high on the L and possibly flat knit knee high on the R    Time  6    Period  Weeks    Status  New    Target Date  06/18/19            Plan - 05/21/19 1512    Clinical Impression Statement  Pt presents to physical therapy after missing an appt on Monday due to nausea and fatigue related to chemotherapy and radiation. MLD was performed for the anterior trunk and the LLE with extra focus on the lower leg and ankle this session. Pt was wrapped with spiral wrap using short stretch bandages and artiflex for shaping to create an upwrad gradient for lymphatic fluid flow with extra padding behind the knee and at the ankle. Pt was educated on reasons to remove the wrap including trying to move about and removing the top bandage if this does not work remove bandage and alert therapist on next appt. Pt will benefit from continued POC.    Personal Factors and Comorbidities  Comorbidity 1    Comorbidities  Endometrioid  cancer    PT Frequency  3x / week    PT Duration  6 weeks    PT Treatment/Interventions  Cryotherapy;Moist Heat;Gait training;Stair training;Functional mobility training;Therapeutic activities;Therapeutic exercise;Balance training;Neuromuscular  re-education;Patient/family education;Manual techniques;Manual lymph drainage;Compression bandaging    PT Next Visit Plan  Assess wrapping, Measurements, assess LE lymphedema HEP and MLD. Beginning of november begin MLD and wrapping. teach self MLD    PT Home Exercise Plan  Access Code: [email or web address]    Recommended Other Services  Remind patient on 06/09/19 she has measurements by Melissa at 12:30 before 1 pm appt.    Consulted and Agree with Plan of Care  Patient       Patient will benefit from skilled therapeutic intervention in order to improve the following deficits and impairments:  Pain, Postural dysfunction, Decreased knowledge of precautions, Increased edema  Visit Diagnosis: Lymphedema, not elsewhere classified  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[facility])  Endometrioid adenocarcinoma of uterus ([facility])  Abnormal posture  Pain in left leg     Problem List Patient Active Problem List   Diagnosis Date Noted  . Goals of care, counseling/discussion 05/12/2019  . Lymphedema   . 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[place]) 04/21/2019  . Hip joint replacement status 07/13/2016  . Primary osteoarthritis of right hip 06/01/2016  . 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 Diarrhea 06/03/2015    Ander Purpura, PT 05/21/2019, 4:33 PM  [place], [place], [address] Phone: [phone]   Fax:  [phone]  Name: Dana [name] MRN: [id number] Date of Birth: 1950-11-08

## 2019-05-21 NOTE — Patient Instructions (Signed)

## 2019-05-22 ENCOUNTER — Ambulatory Visit
Admission: RE | Admit: 2019-05-22 | Discharge: 2019-05-22 | Disposition: A | Discharge status: Home / Self Care | Payer: 59 | Source: Ambulatory Visit | Attending: Radiation Oncology | Admitting: Radiation Oncology

## 2019-05-22 ENCOUNTER — Inpatient Hospital Stay: Payer: 59 | Attending: Gynecologic Oncology

## 2019-05-22 ENCOUNTER — Other Ambulatory Visit: Payer: 59

## 2019-05-22 ENCOUNTER — Ambulatory Visit: Payer: 59

## 2019-05-22 ENCOUNTER — Other Ambulatory Visit: Payer: Self-pay

## 2019-05-22 ENCOUNTER — Telehealth: Payer: Self-pay | Admitting: Oncology

## 2019-05-22 DIAGNOSIS — C541 Malignant neoplasm of endometrium: Secondary | ICD-10-CM | POA: Diagnosis not present

## 2019-05-22 DIAGNOSIS — I890 Lymphedema, not elsewhere classified: Secondary | ICD-10-CM | POA: Insufficient documentation

## 2019-05-22 DIAGNOSIS — R197 Diarrhea, unspecified: Secondary | ICD-10-CM | POA: Diagnosis not present

## 2019-05-22 DIAGNOSIS — C55 Malignant neoplasm of uterus, part unspecified: Secondary | ICD-10-CM

## 2019-05-22 DIAGNOSIS — Z90710 Acquired absence of both cervix and uterus: Secondary | ICD-10-CM | POA: Diagnosis not present

## 2019-05-22 DIAGNOSIS — R350 Frequency of micturition: Secondary | ICD-10-CM | POA: Diagnosis not present

## 2019-05-22 DIAGNOSIS — Z79899 Other long term (current) drug therapy: Secondary | ICD-10-CM | POA: Insufficient documentation

## 2019-05-22 DIAGNOSIS — R110 Nausea: Secondary | ICD-10-CM | POA: Diagnosis not present

## 2019-05-22 DIAGNOSIS — Z9079 Acquired absence of other genital organ(s): Secondary | ICD-10-CM | POA: Insufficient documentation

## 2019-05-22 DIAGNOSIS — C774 Secondary and unspecified malignant neoplasm of inguinal and lower limb lymph nodes: Secondary | ICD-10-CM | POA: Insufficient documentation

## 2019-05-22 DIAGNOSIS — Z90722 Acquired absence of ovaries, bilateral: Secondary | ICD-10-CM | POA: Diagnosis not present

## 2019-05-22 LAB — CBC WITH DIFFERENTIAL/PLATELET
Abs Immature Granulocytes: 0.02 10*3/uL (ref 0.00–0.07)
Basophils Absolute: 0 10*3/uL (ref 0.0–0.1)
Basophils Relative: 0 %
Eosinophils Absolute: 0.2 10*3/uL (ref 0.0–0.5)
Eosinophils Relative: 3 %
HCT: 40.3 % (ref 36.0–46.0)
Hemoglobin: 13.6 g/dL (ref 12.0–15.0)
Immature Granulocytes: 0 %
Lymphocytes Relative: 30 %
Lymphs Abs: 1.6 10*3/uL (ref 0.7–4.0)
MCH: 30.4 pg (ref 26.0–34.0)
MCHC: 33.7 g/dL (ref 30.0–36.0)
MCV: 90 fL (ref 80.0–100.0)
Monocytes Absolute: 0.5 10*3/uL (ref 0.1–1.0)
Monocytes Relative: 10 %
Neutro Abs: 3.1 10*3/uL (ref 1.7–7.7)
Neutrophils Relative %: 57 %
Platelets: 231 10*3/uL (ref 150–400)
RBC: 4.48 MIL/uL (ref 3.87–5.11)
RDW: 12.3 % (ref 11.5–15.5)
WBC: 5.4 10*3/uL (ref 4.0–10.5)
nRBC: 0 % (ref 0.0–0.2)

## 2019-05-22 LAB — COMPREHENSIVE METABOLIC PANEL
ALT: 10 U/L (ref 0–44)
AST: 10 U/L — ABNORMAL LOW (ref 15–41)
Albumin: 3.4 g/dL — ABNORMAL LOW (ref 3.5–5.0)
Alkaline Phosphatase: 84 U/L (ref 38–126)
Anion gap: 10 (ref 5–15)
BUN: 9 mg/dL (ref 8–23)
CO2: 26 mmol/L (ref 22–32)
Calcium: 8.3 mg/dL — ABNORMAL LOW (ref 8.9–10.3)
Chloride: 97 mmol/L — ABNORMAL LOW (ref 98–111)
Creatinine, Ser: 0.79 mg/dL (ref 0.44–1.00)
GFR calc Af Amer: >60 mL/min (ref >60)
GFR calc non Af Amer: >60 mL/min (ref >60)
Glucose, Bld: 102 mg/dL — ABNORMAL HIGH (ref 70–99)
Potassium: 3.7 mmol/L (ref 3.5–5.1)
Sodium: 133 mmol/L — ABNORMAL LOW (ref 135–145)
Total Bilirubin: 0.4 mg/dL (ref 0.3–1.2)
Total Protein: 6.1 g/dL — ABNORMAL LOW (ref 6.5–8.1)

## 2019-05-22 LAB — MAGNESIUM: Magnesium: 1.6 mg/dL — ABNORMAL LOW (ref 1.7–2.4)

## 2019-05-22 NOTE — Telephone Encounter (Signed)
Dana [name] called and asked if her short term disability paperwork has been sent yet.  Advised her that it was sent yesterday.

## 2019-05-23 ENCOUNTER — Other Ambulatory Visit: Payer: Self-pay

## 2019-05-23 ENCOUNTER — Ambulatory Visit
Admission: RE | Admit: 2019-05-23 | Discharge: 2019-05-23 | Disposition: A | Discharge status: Home / Self Care | Payer: 59 | Source: Ambulatory Visit | Attending: Radiation Oncology | Admitting: Radiation Oncology

## 2019-05-23 ENCOUNTER — Ambulatory Visit: Payer: 59

## 2019-05-23 ENCOUNTER — Telehealth: Payer: Self-pay | Admitting: Hematology and Oncology

## 2019-05-23 ENCOUNTER — Encounter: Payer: Self-pay | Admitting: Hematology and Oncology

## 2019-05-23 ENCOUNTER — Inpatient Hospital Stay: Payer: 59 | Admitting: Hematology and Oncology

## 2019-05-23 DIAGNOSIS — C55 Malignant neoplasm of uterus, part unspecified: Secondary | ICD-10-CM | POA: Diagnosis not present

## 2019-05-23 DIAGNOSIS — R110 Nausea: Secondary | ICD-10-CM | POA: Diagnosis not present

## 2019-05-23 DIAGNOSIS — R112 Nausea with vomiting, unspecified: Secondary | ICD-10-CM | POA: Insufficient documentation

## 2019-05-23 DIAGNOSIS — R197 Diarrhea, unspecified: Secondary | ICD-10-CM | POA: Diagnosis not present

## 2019-05-23 DIAGNOSIS — I890 Lymphedema, not elsewhere classified: Secondary | ICD-10-CM | POA: Diagnosis not present

## 2019-05-23 DIAGNOSIS — C541 Malignant neoplasm of endometrium: Secondary | ICD-10-CM | POA: Diagnosis not present

## 2019-05-23 NOTE — Assessment & Plan Note (Signed)
We discussed management for nausea I will prescribe lorazepam for her next treatment

## 2019-05-23 NOTE — Assessment & Plan Note (Signed)
She developed significant nausea after cycle 1 of cisplatin Her nausea is resolving She will continue to take antiemetics as needed For cycle 2, I will consider adding lorazepam but I will wait until I see her back the day before Thanksgiving

## 2019-05-23 NOTE — Progress Notes (Signed)
[place] OFFICE PROGRESS NOTE  Patient Care Team: Shon Baton, MD as PCP - General (Internal Medicine)  ASSESSMENT & PLAN:  Endometrioid adenocarcinoma of uterus [facility]) She developed significant nausea after cycle 1 of cisplatin Her nausea is resolving She will continue to take antiemetics as needed For cycle 2, I will consider adding lorazepam but I will wait until I see her back the day before Thanksgiving  Nausea without vomiting We discussed management for nausea I will prescribe lorazepam for her next treatment  Lymphedema I recommend against taking ibuprofen for mild pain due to risk of fluid retention In the meantime, she will continue physical therapy  Diarrhea She has mild diarrhea likely secondary to side effects of treatment I recommend hydration as tolerated   No orders of the defined types were placed in this encounter.   INTERVAL HISTORY: Please see below for problem oriented charting. 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  The patient's initial surgery was with Dr. Marti Sleigh in [place] on January 05, 2014.  It included an exploratory laparotomy with TAH, BSO, appendectomy, pelvic and para-aortic lymphadenectomy and wedge resection of a concerning appearing liver lesion.  Preoperatively she presented with a 26 cm right adnexal mass and an elevated Ca1 25 at 404 units/mL.  At  the completion of surgery there was no gross residual disease.  Final pathology showed a primary grade 3 endometrial adenocarcinoma (endometrioid) involving both ovaries.  Lymph nodes and other biopsies were negative.  She had uncomplicated postoperative course.  She was treated with 6 cycles of carboplatin paclitaxel chemotherapy.  She was recommended adjuvant radiation therapy however declined this after counseling.  Following her initial therapy she developed severe left lower extremity lymphedema.   Follow-up CT scan in March 2016 showed no evidence of disease.  She was followed every 3 months for a total of 5 years with no recurrence appreciated during that time   03/28/2019 Imaging   Ct abdomen and pelvis 1. Enlarged right inguinal lymph node or soft tissue mass, measuring 3.8 x 3.5 cm (series 2, image 69). Additional enlarged right iliac lymph node measuring 1.9 x 1.1 cm (series 2, image 68). Findings are concerning for malignancy.   2. Status post hysterectomy per stated history of endometrial malignancy.   3. No other findings in the abdomen or pelvis concerning for malignancy or metastatic disease.   4. There are multiple subcentimeter low-attenuation lesions of the liver, the largest in the left lobe the measuring 7 mm, too small to characterize (series 2, image 24). Attention on follow-up although these are most likely incidental benign small cysts and/or hemangiomata.   5.  Aortic atherosclerosis.   04/10/2019 Pathology Results   A. LYMPH NODE, RIGHT INGUINAL, NEEDLE CORE BIOPSY:  - Metastatic adenocarcinoma.  - See comment.   COMMENT:   The morphology is most consistent with metastatic endometrioid adenocarcinoma.  Immunohistochemistry can be performed if clinically indicated.  There is sufficient tissue for  additional testing   04/10/2019 Procedure   Technically successful ultrasound guided biopsy of dominant right inguinal lymph node   04/22/2019 Cancer Staging   Staging  form: Corpus Uteri - Carcinoma and Carcinosarcoma, AJCC 8th Edition - Pathologic stage from 04/22/2019: FIGO Stage IVB (rpT3a, pN0, pM1) - Signed by [name], MD on 04/22/2019   04/22/2019 PET scan   1. Two hypermetabolic RIGHT iliac lymph nodes and larger metastatic RIGHT inguinal lymph node. 2. No evidence of metastatic disease outside the pelvis. 3. No clear evidence of activity at the vaginal cuff.   05/15/2019 Procedure   Placement of a subcutaneous port device. Catheter tip in the lower SVC   05/16/2019 -  Chemotherapy   The patient had cisplatin for chemotherapy treatment.      Genetic Testing   Patient has genetic testing done for MSI. Results revealed patient has the following: MSI Stable     REVIEW OF SYSTEMS:   Constitutional: Denies fevers, chills or abnormal weight loss Eyes: Denies blurriness of vision Ears, nose, mouth, throat, and face: Denies mucositis or sore throat Respiratory: Denies cough, dyspnea or wheezes Cardiovascular: Denies palpitation, chest discomfort or lower extremity swelling Skin: Denies abnormal skin rashes Lymphatics: Denies new lymphadenopathy or easy bruising Neurological:Denies numbness, tingling or new weaknesses Behavioral/Psych: Mood is stable, no new changes  All other systems were reviewed with the patient and are negative.  I have reviewed the past medical history, past surgical history, social history and family history with the patient and they are unchanged from previous note.  ALLERGIES:  is allergic to oxycodone; garlic; other; and penicillins.  MEDICATIONS:  Current Outpatient Medications  Medication Sig Dispense Refill  . amLODipine (NORVASC) 5 MG tablet Take 5 mg by mouth daily.     Marland Kitchen ibuprofen (ADVIL) 200 MG tablet Take 200 mg by mouth every 6 (six) hours as needed.    . lidocaine-prilocaine (EMLA) cream Apply to affected area once 30 g 3  . ondansetron (ZOFRAN) 8 MG tablet Take 1 tablet (8 mg total) by mouth every 8  (eight) hours as needed. 30 tablet 1  . prochlorperazine (COMPAZINE) 10 MG tablet Take 1 tablet (10 mg total) by mouth every 6 (six) hours as needed (Nausea or vomiting). 30 tablet 1  . traMADol (ULTRAM) 50 MG tablet TK 1 T PO  BID PRF PAINS.     No current facility-administered medications for this visit.     PHYSICAL EXAMINATION: ECOG PERFORMANCE STATUS: 1 - Symptomatic but completely ambulatory  Vitals:   05/23/19 1515  BP: 140/77  Pulse: 83  Resp: 18  Temp: 98.3 F (36.8 C)  SpO2: 100%   Filed Weights   05/23/19 1515  Weight: 176 lb 8 oz (80.1 kg)    GENERAL:alert, no distress and comfortable SKIN: skin color, texture, turgor are normal, no rashes or significant lesions EYES: normal, Conjunctiva are pink and non-injected, sclera clear OROPHARYNX:no exudate, no erythema and lips, buccal mucosa, and tongue normal  NECK: supple, thyroid normal size, non-tender, without nodularity LYMPH:  no palpable lymphadenopathy in the cervical, axillary or inguinal LUNGS: clear to auscultation and percussion with normal breathing effort HEART: regular rate & rhythm and no murmurs with mild bilateral lower extremity edema ABDOMEN:abdomen soft, non-tender and normal bowel sounds Musculoskeletal:no cyanosis of digits and no clubbing  NEURO: alert & oriented x 3 with fluent speech, no focal motor/sensory deficits  LABORATORY DATA:  I have reviewed the data as listed    Component Value Date/Time  NA 133 (L) 05/22/2019 1241   K 3.7 05/22/2019 1241   CL 97 (L) 05/22/2019 1241   CO2 26 05/22/2019 1241   GLUCOSE 102 (H) 05/22/2019 1241   BUN 9 05/22/2019 1241   CREATININE 0.79 05/22/2019 1241   CALCIUM 8.3 (L) 05/22/2019 1241   PROT 6.1 (L) 05/22/2019 1241   ALBUMIN 3.4 (L) 05/22/2019 1241   AST 10 (L) 05/22/2019 1241   ALT 10 05/22/2019 1241   ALKPHOS 84 05/22/2019 1241   BILITOT 0.4 05/22/2019 1241   GFRNONAA >60 05/22/2019 1241   GFRAA >60 05/22/2019 1241    No results found  for: SPEP, UPEP  Lab Results  Component Value Date   WBC 5.4 05/22/2019   NEUTROABS 3.1 05/22/2019   HGB 13.6 05/22/2019   HCT 40.3 05/22/2019   MCV 90.0 05/22/2019   PLT 231 05/22/2019      Chemistry      Component Value Date/Time   NA 133 (L) 05/22/2019 1241   K 3.7 05/22/2019 1241   CL 97 (L) 05/22/2019 1241   CO2 26 05/22/2019 1241   BUN 9 05/22/2019 1241   CREATININE 0.79 05/22/2019 1241      Component Value Date/Time   CALCIUM 8.3 (L) 05/22/2019 1241   ALKPHOS 84 05/22/2019 1241   AST 10 (L) 05/22/2019 1241   ALT 10 05/22/2019 1241   BILITOT 0.4 05/22/2019 1241       RADIOGRAPHIC STUDIES: I have personally reviewed the radiological images as listed and agreed with the findings in the report. Ir Imaging Guided Port Insertion  Result Date: 05/15/2019 INDICATION: 68 year old with metastatic endometrial cancer. Port-A-Cath needed for chemotherapy. EXAM: FLUOROSCOPIC AND ULTRASOUND GUIDED PLACEMENT OF A SUBCUTANEOUS PORT COMPARISON:  None. MEDICATIONS: Clindamycin 900 mg; The antibiotic was administered within an appropriate time interval prior to skin puncture. ANESTHESIA/SEDATION: Versed 3.0 mg IV; Fentanyl 100 mcg IV; Moderate Sedation Time:  31 minutes The patient was continuously monitored during the procedure by the interventional radiology nurse under my direct supervision. FLUOROSCOPY TIME:  30 seconds, 3 mGy COMPLICATIONS: None immediate. PROCEDURE: The procedure, risks, benefits, and alternatives were explained to the patient. Questions regarding the procedure were encouraged and answered. The patient understands and consents to the procedure. Patient was placed supine on the interventional table. Ultrasound confirmed a patent right internal jugular vein. Ultrasound image was saved for documentation. The right chest and neck were cleaned with a skin antiseptic and a sterile drape was placed. Maximal barrier sterile technique was utilized including caps, mask, sterile  gowns, sterile gloves, sterile drape, hand hygiene and skin antiseptic. The right neck was anesthetized with 1% lidocaine. Small incision was made in the right neck with a blade. Micropuncture set was placed in the right internal jugular vein with ultrasound guidance. The micropuncture wire was used for measurement purposes. The right chest was anesthetized with 1% lidocaine with epinephrine. #15 blade was used to make an incision and a subcutaneous port pocket was formed. Noonday was assembled. Subcutaneous tunnel was formed with a stiff tunneling device. The port catheter was brought through the subcutaneous tunnel. The port was placed in the subcutaneous pocket. The micropuncture set was exchanged for a peel-away sheath. The catheter was placed through the peel-away sheath and the tip was positioned in the lower SVC. Catheter placement was confirmed with fluoroscopy. The port was accessed and flushed with heparinized saline. The port pocket was closed using two layers of absorbable sutures and Dermabond. The vein skin site was  closed using a single layer of absorbable suture and Dermabond. Sterile dressings were applied. Patient tolerated the procedure well without an immediate complication. Ultrasound and fluoroscopic images were taken and saved for this procedure. IMPRESSION: Placement of a subcutaneous port device. Catheter tip in the lower SVC. Electronically Signed   By: Markus Daft M.D.   On: 05/15/2019 14:49    All questions were answered. The patient knows to call the clinic with any problems, questions or concerns. No barriers to learning was detected.  I spent 15 minutes counseling the patient face to face. The total time spent in the appointment was 20 minutes and more than 50% was on counseling and review of test results  [name], MD 05/23/2019 3:36 PM

## 2019-05-23 NOTE — Telephone Encounter (Signed)
Confirmed November appointments with patient.

## 2019-05-23 NOTE — Assessment & Plan Note (Signed)
I recommend against taking ibuprofen for mild pain due to risk of fluid retention In the meantime, she will continue physical therapy

## 2019-05-23 NOTE — Assessment & Plan Note (Signed)
She has mild diarrhea likely secondary to side effects of treatment I recommend hydration as tolerated

## 2019-05-26 ENCOUNTER — Ambulatory Visit: Payer: 59 | Admitting: Rehabilitation

## 2019-05-26 ENCOUNTER — Other Ambulatory Visit: Payer: Self-pay

## 2019-05-26 ENCOUNTER — Ambulatory Visit
Admission: RE | Admit: 2019-05-26 | Discharge: 2019-05-26 | Disposition: A | Discharge status: Home / Self Care | Payer: 59 | Source: Ambulatory Visit | Attending: Radiation Oncology | Admitting: Radiation Oncology

## 2019-05-26 ENCOUNTER — Encounter: Payer: Self-pay | Admitting: Rehabilitation

## 2019-05-26 DIAGNOSIS — C55 Malignant neoplasm of uterus, part unspecified: Secondary | ICD-10-CM

## 2019-05-26 DIAGNOSIS — C774 Secondary and unspecified malignant neoplasm of inguinal and lower limb lymph nodes: Secondary | ICD-10-CM

## 2019-05-26 DIAGNOSIS — R293 Abnormal posture: Secondary | ICD-10-CM

## 2019-05-26 DIAGNOSIS — M79605 Pain in left leg: Secondary | ICD-10-CM

## 2019-05-26 DIAGNOSIS — C541 Malignant neoplasm of endometrium: Secondary | ICD-10-CM | POA: Diagnosis not present

## 2019-05-26 DIAGNOSIS — I890 Lymphedema, not elsewhere classified: Secondary | ICD-10-CM

## 2019-05-26 NOTE — Therapy (Signed)
[place], [place], [address] Phone: [phone]   Fax:  [phone]  Physical Therapy Treatment  Patient Details  Name: Dana [name] MRN: [id number] Date of Birth: [birth year] Referring Provider (PT): Gery Pray   Encounter Date: 05/26/2019  PT End of Session - 05/26/19 1445    Visit Number  5    Number of Visits  15    Date for PT Re-Evaluation  06/18/19    PT Start Time  1638    PT Stop Time  1356    PT Time Calculation (min)  54 min    Activity Tolerance  Patient tolerated treatment well    Behavior During Therapy  [facility] for tasks assessed/performed       Past Medical History:  Diagnosis Date  . Arthritis   . GERD (gastroesophageal reflux disease)   . History of blood transfusion   . Hypothyroidism   . Lymphedema    left leg- ted hose makes it worse  . Thyroid disease   . Uterine cancer [facility])    Endometrial cancer - surgery and chemo    Past Surgical History:  Procedure Laterality Date  . ABDOMINAL HYSTERECTOMY    . APPENDECTOMY    . COLONOSCOPY W/ POLYPECTOMY    . EYE SURGERY Bilateral    cataracts  . IR IMAGING GUIDED PORT INSERTION  05/15/2019  . LIVER BIOPSY    . mass removed     2015 from pelvis  . TOTAL HIP ARTHROPLASTY Right 07/13/2016   Procedure: RIGHT TOTAL HIP ARTHROPLASTY ANTERIOR APPROACH;  Surgeon: Leandrew Koyanagi, MD;  Location: [facility];  Service: Orthopedics;  Laterality: Right;    There were no vitals filed for this visit.  Subjective Assessment - 05/26/19 1306    Subjective  My doctor told me I can only do 2x per week so I will only be doing monday and wednesday.  Bandages today have been off since Friday and it didn't seem to get any larger.  I already feel less off balance    Pertinent History  recurrent endometrial adenocarcinoma (grade 3) in  R inguinal and pelvic lymph nodes, Previously on L in 2015    Currently in Pain?  No/denies             LYMPHEDEMA/ONCOLOGY QUESTIONNAIRE - 05/26/19 1309      Left Lower Extremity Lymphedema   10 cm Proximal to Suprapatella  48 cm    At Midpatella/Popliteal Crease  45.8 cm   in seated   30 cm Proximal to Floor at Lateral Plantar Foot  45.6 cm    20 cm Proximal to Floor at Lateral Plantar Foot  39.5 cm    10 cm Proximal to Floor at Lateral Malleoli  28.5 cm    5 cm Proximal to 1st MTP Joint  22.7 cm    Across MTP Joint  23 cm    Around Proximal Great Toe  8.1 cm           Outpatient Re[facility] from [facility]30/[facility]  Lymphedema Life Impact Scale Total Score  70.59 %           [facility] Adult PT Treatment/Exercise - 05/26/19 0001      Manual Therapy   Manual therapy comments  measurements taken    Manual Lymphatic Drainage (MLD)  In supine, no abdominals due to radiation, Left axillary nodes, Left inguinal nodes, Lt inguino-axillary anastomosis, LLE medial-lateral, lateral, posterior, medial knee,  posterior knee, all surface of the lower leg, medial/lateral ankle then re-worked all surfaces    Compression Bandaging  Pt was wrapped with TG soft base layer, 1x 2 inch folded elastomull at the toes, 3 artiflex for shaping, 1x 6 cm for roman sandal, 1x 8 cm for ankle-sole-heel, 2x 10 cm and 1 x 12 cm spiral pattern upward from foot to groin.                PT Short Term Goals - 04/30/19 1015      PT SHORT TERM GOAL #1   Title  Pt will be independent with lymphatic facilitation exercises within 2 weeks.    Baseline  Pt did not have exercises    Time  2    Period  Weeks    Status  New    Target Date  05/21/19      PT SHORT TERM GOAL #2   Title  Pt will be independent with self MLD to demonstrate autonomy of care    Baseline  Pt does not know how to perform MLD    Time  2    Period  Weeks    Status  New    Target Date  05/21/19        PT Long Term Goals - 05/26/19 1446      PT LONG TERM GOAL #1   Title  Pt will  report 1/10 pain or less within 6 weeks in order to promote mobility and increase gait ability within    Baseline  reporting 0.10 pain on 05/26/19    Status  Achieved      PT LONG TERM GOAL #2   Title  Patient will have reduction of LLE limb  girth by 3 cm or greater and RLE lower leg limb girth by atleast 2 cm within 6 weeks to demonstrate decrease lymphatic build up and decrease risk for infection.    Status  Partially Met      PT LONG TERM GOAL #3   Title  Patient to be properly fitted with compression garment to wear on daily basis    Status  On-going            Plan - 05/26/19 1446    Clinical Impression Statement  Discussed POC with patient whose MD advised cancelling all Friday appointments due to energy levels.  Pt ultimately wanting to see if that frequency will work ok without learning to self bandage or use velcro instead.  Pt open to velcro assist if needed.  Pt did already have a signficant reduction in lower leg measurements around 2.5-3cm, no changes in the foot, and decrease of around 1cm in the thigh.    PT Frequency  2x / week    PT Duration  6 weeks    PT Treatment/Interventions  Cryotherapy;Moist Heat;Gait training;Stair training;Functional mobility training;Therapeutic activities;Therapeutic exercise;Balance training;Neuromuscular re-education;Patient/family education;Manual techniques;Manual lymph drainage;Compression bandaging    PT Next Visit Plan  Measurements on Monday or as needed, continue CDT of the Lt LE until garments arrive.  teach self MLD for weekends when not bandaged with decreased POC       Patient will benefit from skilled therapeutic intervention in order to improve the following deficits and impairments:     Visit Diagnosis: Lymphedema, not elsewhere classified  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[facility])  Endometrioid adenocarcinoma of uterus ([facility])  Abnormal posture  Pain in left leg     Problem List Patient Active  Problem  List   Diagnosis Date Noted  . Nausea without vomiting 05/23/2019  . Goals of care, counseling/discussion 05/12/2019  . Lymphedema   . 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[place]) 04/21/2019  . Hip joint replacement status 07/13/2016  . Primary osteoarthritis of right hip 06/01/2016  . Endometrioid adenocarcinoma of uterus ([place]) 06/03/2015  . Diarrhea 06/03/2015    Stark Bray 05/26/2019, 2:50 PM  [place], [place], [address] Phone: [phone]   Fax:  [phone]  Name: SHENICKA SUNDERLIN MRN: [id number] Date of Birth: 12/11/1950

## 2019-05-27 ENCOUNTER — Other Ambulatory Visit: Payer: Self-pay

## 2019-05-27 ENCOUNTER — Ambulatory Visit
Admission: RE | Admit: 2019-05-27 | Discharge: 2019-05-27 | Disposition: A | Discharge status: Home / Self Care | Payer: 59 | Source: Ambulatory Visit | Attending: Radiation Oncology | Admitting: Radiation Oncology

## 2019-05-27 DIAGNOSIS — C541 Malignant neoplasm of endometrium: Secondary | ICD-10-CM | POA: Diagnosis not present

## 2019-05-28 ENCOUNTER — Ambulatory Visit
Admission: RE | Admit: 2019-05-28 | Discharge: 2019-05-28 | Disposition: A | Discharge status: Home / Self Care | Payer: 59 | Source: Ambulatory Visit | Attending: Radiation Oncology | Admitting: Radiation Oncology

## 2019-05-28 ENCOUNTER — Ambulatory Visit: Payer: 59

## 2019-05-28 ENCOUNTER — Other Ambulatory Visit: Payer: Self-pay

## 2019-05-28 DIAGNOSIS — C541 Malignant neoplasm of endometrium: Secondary | ICD-10-CM | POA: Diagnosis not present

## 2019-05-28 DIAGNOSIS — C774 Secondary and unspecified malignant neoplasm of inguinal and lower limb lymph nodes: Secondary | ICD-10-CM

## 2019-05-28 DIAGNOSIS — I890 Lymphedema, not elsewhere classified: Secondary | ICD-10-CM

## 2019-05-28 DIAGNOSIS — C55 Malignant neoplasm of uterus, part unspecified: Secondary | ICD-10-CM

## 2019-05-28 DIAGNOSIS — R293 Abnormal posture: Secondary | ICD-10-CM

## 2019-05-28 DIAGNOSIS — M79605 Pain in left leg: Secondary | ICD-10-CM

## 2019-05-28 NOTE — Therapy (Signed)
[place], [place], [address] Phone: [phone]   Fax:  [phone]  Physical Therapy Treatment  Patient Details  Name: Dana [name] MRN: [id number] Date of Birth: 01/29/1951 Referring Provider (PT): Gery Pray   Encounter Date: 05/28/2019  PT End of Session - 05/28/19 1307    Visit Number  6    Number of Visits  15    Date for PT Re-Evaluation  06/18/19    PT Start Time  0315    PT Stop Time  1358    PT Time Calculation (min)  55 min    Activity Tolerance  Patient tolerated treatment well    Behavior During Therapy  [facility] for tasks assessed/performed       Past Medical History:  Diagnosis Date  . Arthritis   . GERD (gastroesophageal reflux disease)   . History of blood transfusion   . Hypothyroidism   . Lymphedema    left leg- ted hose makes it worse  . Thyroid disease   . Uterine cancer [facility])    Endometrial cancer - surgery and chemo    Past Surgical History:  Procedure Laterality Date  . ABDOMINAL HYSTERECTOMY    . APPENDECTOMY    . COLONOSCOPY W/ POLYPECTOMY    . EYE SURGERY Bilateral    cataracts  . IR IMAGING GUIDED PORT INSERTION  05/15/2019  . LIVER BIOPSY    . mass removed     2015 from pelvis  . TOTAL HIP ARTHROPLASTY Right 07/13/2016   Procedure: RIGHT TOTAL HIP ARTHROPLASTY ANTERIOR APPROACH;  Surgeon: Leandrew Koyanagi, MD;  Location: [place];  Service: Orthopedics;  Laterality: Right;    There were no vitals filed for this visit.  Subjective Assessment - 05/28/19 1307    Subjective  Pt states that she has been doing some of the lymphatic massage at home. She States that she does have some soreness at her R ankle.    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  5 minutes    Patient Stated Goals  I want to be able to walk again.    Currently in Pain?   Yes    Pain Score  1     Pain Location  Ankle    Pain Orientation  Left;Lateral    Pain Descriptors / Indicators  Burning    Pain Type  Chronic pain    Pain Onset  More than a month ago                  Outpatient Re[facility]  Lymphedema Life Impact Scale Total Score  70.59 %           [facility] Adult PT Treatment/Exercise - 05/28/19 0001      Manual Therapy   Manual Therapy  Manual Lymphatic Drainage (MLD);Compression Bandaging    Manual Lymphatic Drainage (MLD)  In supine, deep breathing only due to radiation, Bil axillary nodes, Bil inguinal nodes, Bil inguino-axillary anastomosis, Figure 7 on the inguno axillary anastomosis, LLE medial-lateral, lateral, posterior, medial knee, posterior knee, all surface of the lower leg and medial/lateral ankle with extra time spent on the loewr leg. Reworked all surfaces.     Compression Bandaging  Pt was wrapped with TG soft base layer, 1x 2 inch folded elastomull at the toes, 3  artiflex for shaping, 1x 6 cm for roman sandal, 1x 8 cm for ankle-sole-heel, 2x 10 cm and 1 x 12 cm spiral pattern upward from knee to groin. 0.5 cm gray foam was used on the media/lateral lower leg for fibrosis.              PT Education - 05/28/19 1403    Education Details  pt was provided with self leg wrapping instructions due to she will only be coming 2x/week. Discussed performing what she is able to due to fatigue/nausea.    Person(s) Educated  Patient    Methods  Explanation    Comprehension  Verbalized understanding       PT Short Term Goals - 04/30/19 1015      PT SHORT TERM GOAL #1   Title  Pt will be independent with lymphatic facilitation exercises within 2 weeks.    Baseline  Pt did not have exercises    Time  2    Period  Weeks    Status  New    Target Date  05/21/19      PT SHORT TERM GOAL #2   Title  Pt will be independent with self MLD to demonstrate autonomy of care     Baseline  Pt does not know how to perform MLD    Time  2    Period  Weeks    Status  New    Target Date  05/21/19        PT Long Term Goals - 05/26/19 1446      PT LONG TERM GOAL #1   Title  Pt will report 1/10 pain or less within 6 weeks in order to promote mobility and increase gait ability within    Baseline  reporting 0.10 pain on 05/26/19    Status  Achieved      PT LONG TERM GOAL #2   Title  Patient will have reduction of LLE limb  girth by 3 cm or greater and RLE lower leg limb girth by atleast 2 cm within 6 weeks to demonstrate decrease lymphatic build up and decrease risk for infection.    Status  Partially Met      PT LONG TERM GOAL #3   Title  Patient to be properly fitted with compression garment to wear on daily basis    Status  On-going            Plan - 05/28/19 1305    Clinical Impression Statement  Pt presents with observable decrease in edema since this physical therapist saw her last. She continues with fibrosis in the lower leg and was provided with thin gray foam in order to address this this session. Extra time spent on MLD over the L ankle and lower leg to facilitate fluid flow out of this area. She was provided with self wrapping instructions to go over and only to perform if she feels comfortable; otherwise we will go over this at the next session. Pt will perform MLD and possibly self wrapping at home as she feels comfortable with decreased frequency. Pt will benefit from continued POC.    Personal Factors and Comorbidities  Comorbidity 1    Comorbidities  Endometrioid cancer    Stability/Clinical Decision Making  Stable/Uncomplicated    Rehab Potential  Excellent    PT Frequency  2x / week    PT Duration  6 weeks    PT Treatment/Interventions  Cryotherapy;Moist Heat;Gait training;Stair training;Functional mobility training;Therapeutic activities;Therapeutic exercise;Balance training;Neuromuscular re-education;Patient/family  education;Manual  techniques;Manual lymph drainage;Compression bandaging    PT Next Visit Plan  Measurements as needed, self wrapping, continue CDT of the Lt LE until garments arrive.  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 Nausea without vomiting 05/23/2019  . Goals of care, counseling/discussion 05/12/2019  . Lymphedema   . 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[place]) 04/21/2019  . Hip joint replacement status 07/13/2016  . Primary osteoarthritis of right hip 06/01/2016  . Endometrioid adenocarcinoma of uterus ([place]) 06/03/2015  . Diarrhea 06/03/2015    Ander Purpura, PT 05/28/2019, 2:06 PM  [place], [place], [address] Phone: [phone]   Fax:  [phone]  Name: CAPRISHA BRIDGETT MRN: [id number] Date of Birth: 1951/02/12

## 2019-05-29 ENCOUNTER — Other Ambulatory Visit: Payer: Self-pay

## 2019-05-29 ENCOUNTER — Ambulatory Visit
Admission: RE | Admit: 2019-05-29 | Discharge: 2019-05-29 | Disposition: A | Discharge status: Home / Self Care | Payer: 59 | Source: Ambulatory Visit | Attending: Radiation Oncology | Admitting: Radiation Oncology

## 2019-05-29 DIAGNOSIS — C541 Malignant neoplasm of endometrium: Secondary | ICD-10-CM | POA: Diagnosis not present

## 2019-05-30 ENCOUNTER — Ambulatory Visit: Payer: 59

## 2019-06-02 ENCOUNTER — Ambulatory Visit
Admission: RE | Admit: 2019-06-02 | Discharge: 2019-06-02 | Disposition: A | Discharge status: Home / Self Care | Payer: 59 | Source: Ambulatory Visit | Attending: Radiation Oncology | Admitting: Radiation Oncology

## 2019-06-02 ENCOUNTER — Ambulatory Visit: Payer: 59

## 2019-06-02 ENCOUNTER — Other Ambulatory Visit: Payer: Self-pay

## 2019-06-02 DIAGNOSIS — C541 Malignant neoplasm of endometrium: Secondary | ICD-10-CM | POA: Diagnosis not present

## 2019-06-02 DIAGNOSIS — C55 Malignant neoplasm of uterus, part unspecified: Secondary | ICD-10-CM

## 2019-06-02 DIAGNOSIS — I890 Lymphedema, not elsewhere classified: Secondary | ICD-10-CM

## 2019-06-02 DIAGNOSIS — M79605 Pain in left leg: Secondary | ICD-10-CM

## 2019-06-02 DIAGNOSIS — C774 Secondary and unspecified malignant neoplasm of inguinal and lower limb lymph nodes: Secondary | ICD-10-CM

## 2019-06-02 DIAGNOSIS — R293 Abnormal posture: Secondary | ICD-10-CM

## 2019-06-02 NOTE — Therapy (Signed)
[place], [place], [address] Phone: [phone]   Fax:  [phone]  Physical Therapy Treatment  Patient Details  Name: Dana [name] MRN: [id number] Date of Birth: [birth year] Referring Provider (PT): Gery Pray   Encounter Date: 06/02/2019  PT End of Session - 06/02/19 1300    Visit Number  7    Number of Visits  15    Date for PT Re-Evaluation  06/18/19    PT Start Time  1300    PT Stop Time  1345    PT Time Calculation (min)  45 min    Activity Tolerance  Patient tolerated treatment well    Behavior During Therapy  [facility] for tasks assessed/performed       Past Medical History:  Diagnosis Date  . Arthritis   . GERD (gastroesophageal reflux disease)   . History of blood transfusion   . Hypothyroidism   . Lymphedema    left leg- ted hose makes it worse  . Thyroid disease   . Uterine cancer [facility])    Endometrial cancer - surgery and chemo    Past Surgical History:  Procedure Laterality Date  . ABDOMINAL HYSTERECTOMY    . APPENDECTOMY    . COLONOSCOPY W/ POLYPECTOMY    . EYE SURGERY Bilateral    cataracts  . IR IMAGING GUIDED PORT INSERTION  05/15/2019  . LIVER BIOPSY    . mass removed     2015 from pelvis  . TOTAL HIP ARTHROPLASTY Right 07/13/2016   Procedure: RIGHT TOTAL HIP ARTHROPLASTY ANTERIOR APPROACH;  Surgeon: Leandrew Koyanagi, MD;  Location: [place];  Service: Orthopedics;  Laterality: Right;    There were no vitals filed for this visit.  Subjective Assessment - 06/02/19 1301    Subjective  Pt states that she has started her exercises back and has been performing MLD. She was called by the pump company and they told her she should get her pump in 2 days. She states that she is so fatigued and not feeling well she would like to decrease her physical therapy once she gets the pump and would like to work on self wrapping.    Pertinent History  recurrent endometrial adenocarcinoma  (grade 3) in  R inguinal and pelvic lymph nodes, Previously on L in 2015    How long can you walk comfortably?  5 minutes    Patient Stated Goals  I want to be able to walk again.    Currently in Pain?  No/denies    Pain Score  0-No pain                  Outpatient Rehab from [facility]/30/2019 [facility]  Lymphedema Life Impact Scale Total Score  70.59 %           [facility] Adult PT Treatment/Exercise - 06/02/19 0001      Manual Therapy   Manual Therapy  Compression Bandaging    Compression Bandaging  Pt was wrapped with TG soft base layer, 1x 2 inch folded elastomull at the toes, 3 artiflex for shaping, 1x 6 cm for roman sandal, 1x 8 cm for ankle-sole-heel, 2x 10 cm and 1 x 12 cm spiral pattern upward from knee to groin. Demonstration with return demonstration and then physical therapist wrap for each section including the toes. VC for direction, over lapping by half, figure 8 and the importance of not creating a tourniquet.  PT Education - 06/02/19 1345    Education Details  Pt was educated on self wrapping with demonstration and return demonstration for each step with VC for overlapping by half, avoiding tourniquet effect, firm wrapping and the importance of wrapping the toes including risk for toe swelling/blistering if she does not cover the toes.    Person(s) Educated  Patient    Methods  Explanation;Demonstration;Verbal cues;Handout    Comprehension  Verbalized understanding;Returned demonstration       PT Short Term Goals - 04/30/19 1015      PT SHORT TERM GOAL #1   Title  Pt will be independent with lymphatic facilitation exercises within 2 weeks.    Baseline  Pt did not have exercises    Time  2    Period  Weeks    Status  New    Target Date  05/21/19      PT SHORT TERM GOAL #2   Title  Pt will be independent with self MLD to demonstrate autonomy of care    Baseline  Pt does not know how to perform MLD     Time  2    Period  Weeks    Status  New    Target Date  05/21/19        PT Long Term Goals - 05/26/19 1446      PT LONG TERM GOAL #1   Title  Pt will report 1/10 pain or less within 6 weeks in order to promote mobility and increase gait ability within    Baseline  reporting 0.10 pain on 05/26/19    Status  Achieved      PT LONG TERM GOAL #2   Title  Patient will have reduction of LLE limb  girth by 3 cm or greater and RLE lower leg limb girth by atleast 2 cm within 6 weeks to demonstrate decrease lymphatic build up and decrease risk for infection.    Status  Partially Met      PT LONG TERM GOAL #3   Title  Patient to be properly fitted with compression garment to wear on daily basis    Status  On-going            Plan - 06/02/19 1300    Clinical Impression Statement  Due to fatigue, nausea from chemotherapy pt would like to perform wrapping and MLD at home. She should be receiving her vasopneumatic pump soon and was educated on self wrapping this session with demonstration and return demonstration for accuracy. She was educatedon wrapping concepts and the importance of avoiding tourniquet effect as well as wrapping the toes to decrease risk for blistering. Pt will return for 2 more visits to ensure that she is comfortable with self MLD and wrapping for autonomy of care and will then be discharged to home management. She will return if she has any increased in heaviness, aching, visible edema.    Personal Factors and Comorbidities  Comorbidity 1    Comorbidities  Endometrioid cancer    PT Frequency  2x / week    PT Duration  6 weeks    PT Treatment/Interventions  Cryotherapy;Moist Heat;Gait training;Stair training;Functional mobility training;Therapeutic activities;Therapeutic exercise;Balance training;Neuromuscular re-education;Patient/family education;Manual techniques;Manual lymph drainage;Compression bandaging    PT Next Visit Plan  measurements, assess self wrapping and self  MLD. Let patient know if alight will pay or not.    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 Nausea without vomiting 05/23/2019  . Goals of care, counseling/discussion 05/12/2019  . Lymphedema   . 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[place]) 04/21/2019  . Hip joint replacement status 07/13/2016  . Primary osteoarthritis of right hip 06/01/2016  . Endometrioid adenocarcinoma of uterus ([place]) 06/03/2015  . Diarrhea 06/03/2015    Ander Purpura, PT 06/02/2019, 1:51 PM  [place], [place], [address] Phone: [phone]   Fax:  [phone]  Name: NYLAN NAKATANI MRN: [id number] Date of Birth: 05-Mar-1951

## 2019-06-03 ENCOUNTER — Other Ambulatory Visit: Payer: Self-pay

## 2019-06-03 ENCOUNTER — Ambulatory Visit
Admission: RE | Admit: 2019-06-03 | Discharge: 2019-06-03 | Disposition: A | Discharge status: Home / Self Care | Payer: 59 | Source: Ambulatory Visit | Attending: Radiation Oncology | Admitting: Radiation Oncology

## 2019-06-03 ENCOUNTER — Other Ambulatory Visit: Payer: Self-pay | Admitting: Radiation Oncology

## 2019-06-03 DIAGNOSIS — C541 Malignant neoplasm of endometrium: Secondary | ICD-10-CM | POA: Diagnosis not present

## 2019-06-03 MED ORDER — DIPHENOXYLATE-ATROPINE 2.5-0.025 MG PO TABS: 1.0000 | ORAL_TABLET | Freq: Four times a day (QID) | ORAL | 0 refills | Status: DC | PRN

## 2019-06-04 ENCOUNTER — Other Ambulatory Visit: Payer: Self-pay

## 2019-06-04 ENCOUNTER — Ambulatory Visit
Admission: RE | Admit: 2019-06-04 | Discharge: 2019-06-04 | Disposition: A | Discharge status: Home / Self Care | Payer: 59 | Source: Ambulatory Visit | Attending: Radiation Oncology | Admitting: Radiation Oncology

## 2019-06-04 DIAGNOSIS — C541 Malignant neoplasm of endometrium: Secondary | ICD-10-CM | POA: Diagnosis not present

## 2019-06-05 ENCOUNTER — Ambulatory Visit: Payer: 59

## 2019-06-05 ENCOUNTER — Telehealth: Payer: Self-pay | Admitting: Oncology

## 2019-06-05 NOTE — Telephone Encounter (Signed)
Malanni called and said she wants to stop the radiation treatments.  She is having severe diarrhea that starts 2 hours after radiation and lasts all night.  She is also having nausea.  She is taking Imodium and Lomotil which are not helping. She is also taking compazine as needed for the nausea.  She is wondering what her other treatment options would be and said Dr. Denman George had discussed possible surgery.  Advised her that she can come in and see Dr. Sondra Come today before her treatment.  She said she feels too weak and said she was up all night. Advised her that I will notify the treatment machine that she needs to cancel today and also notify Dr. Sondra Come and call her back.

## 2019-06-06 ENCOUNTER — Telehealth: Payer: Self-pay | Admitting: Oncology

## 2019-06-06 ENCOUNTER — Ambulatory Visit: Payer: 59

## 2019-06-06 NOTE — Telephone Encounter (Signed)
Called Dana [name] back and advised her that Dr. Denman George has been notified and that her case will be discussed on Monday's GYN tumor conference and that we will call her back with the recommendations.

## 2019-06-06 NOTE — Telephone Encounter (Signed)
Called Dana [name] to see how she is feeling.  She said she is feeling a little better today.  She has not had any diarrhea since yesterday.  She can't take Imodium because it gives her severe stomach cramps.  She took Lomotil 3 times yesterday and Pepto Bismol 3 times.  She said she is still feeling sick to her stomach.  She is able to drink water and fruit juice and is eating very little.  Advised her to try drinking some Gatorade as well as taking Lomotil 1 pill every 6 hours and compazine as needed for the nausea.  Offered an appointment for the symptom management clinic and she refused.  She again said that she wants to stop radiation.  She wants to talk to Dr. Denman George about surgery or just chemotherapy.

## 2019-06-09 ENCOUNTER — Ambulatory Visit: Payer: 59

## 2019-06-09 ENCOUNTER — Telehealth: Payer: Self-pay | Admitting: Hematology and Oncology

## 2019-06-09 ENCOUNTER — Other Ambulatory Visit: Payer: Self-pay

## 2019-06-09 ENCOUNTER — Telehealth: Payer: Self-pay | Admitting: Oncology

## 2019-06-09 DIAGNOSIS — R293 Abnormal posture: Secondary | ICD-10-CM

## 2019-06-09 DIAGNOSIS — M79605 Pain in left leg: Secondary | ICD-10-CM

## 2019-06-09 DIAGNOSIS — C774 Secondary and unspecified malignant neoplasm of inguinal and lower limb lymph nodes: Secondary | ICD-10-CM

## 2019-06-09 DIAGNOSIS — I890 Lymphedema, not elsewhere classified: Secondary | ICD-10-CM | POA: Diagnosis not present

## 2019-06-09 DIAGNOSIS — C55 Malignant neoplasm of uterus, part unspecified: Secondary | ICD-10-CM

## 2019-06-09 NOTE — Therapy (Addendum)
[place], [place], [address] Phone: [phone]   Fax:  [phone]  Physical Therapy Treatment Progress Note Reporting Period 04/30/19  to 06/09/19  See note below for Objective Data and Assessment of Progress/Goals.    Patient Details  Name: Dana [name] MRN: [id number] Date of Birth: 31-Jul-1950 Referring Provider (PT): Gery Pray   Encounter Date: 06/09/2019  PT End of Session - 06/09/19 1305    Visit Number  8    Number of Visits  15    Date for PT Re-Evaluation  06/18/19    PT Start Time  1300    PT Stop Time  1354    PT Time Calculation (min)  54 min    Activity Tolerance  Patient tolerated treatment well    Behavior During Therapy  [facility] for tasks assessed/performed       Past Medical History:  Diagnosis Date  . Arthritis   . GERD (gastroesophageal reflux disease)   . History of blood transfusion   . Hypothyroidism   . Lymphedema    left leg- ted hose makes it worse  . Thyroid disease   . Uterine cancer [facility])    Endometrial cancer - surgery and chemo    Past Surgical History:  Procedure Laterality Date  . ABDOMINAL HYSTERECTOMY    . APPENDECTOMY    . COLONOSCOPY W/ POLYPECTOMY    . EYE SURGERY Bilateral    cataracts  . IR IMAGING GUIDED PORT INSERTION  05/15/2019  . LIVER BIOPSY    . mass removed     2015 from pelvis  . TOTAL HIP ARTHROPLASTY Right 07/13/2016   Procedure: RIGHT TOTAL HIP ARTHROPLASTY ANTERIOR APPROACH;  Surgeon: Leandrew Koyanagi, MD;  Location: [place];  Service: Orthopedics;  Laterality: Right;    There were no vitals filed for this visit.  Subjective Assessment - 06/09/19 1305    Subjective  Pt reports that she got her pump and has used it a couple of times. She states that she was having difficulty getting it to work at first but figured out if she sits on her sofa it works perfectly. She has been wrapping her leg at home without difficulty.    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  5 minutes    Patient Stated Goals  I want to be able to walk again.    Currently in Pain?  No/denies    Pain Score  0-No pain            LYMPHEDEMA/ONCOLOGY QUESTIONNAIRE - 06/09/19 1311      Right Lower Extremity Lymphedema   20 cm Proximal to Suprapatella  58.5 cm    10 cm Proximal to Suprapatella  49.5 cm    At Midpatella/Popliteal Crease  38 cm    30 cm Proximal to Floor at Lateral Plantar Foot  39.6 cm    20 cm Proximal to Floor at Lateral Plantar Foot  30.5 1    10  cm Proximal to Floor at Lateral Malleoli  22.5 cm    5 cm Proximal to 1st MTP Joint  21.5 cm    Across MTP Joint  21.5 cm    Around Proximal Great Toe  7.2 cm      Left Lower Extremity Lymphedema   20 cm Proximal to Suprapatella  53 cm  10 cm Proximal to Suprapatella  48 cm    At Midpatella/Popliteal Crease  41.8 cm    30 cm Proximal to Floor at Lateral Plantar Foot  43 cm    20 cm Proximal to Floor at Lateral Plantar Foot  36.3 cm    10 cm Proximal to Floor at Lateral Malleoli  28.2 cm    5 cm Proximal to 1st MTP Joint  22.4 cm    Across MTP Joint  23 cm    Around Proximal Great Toe  7.6 cm           Outpatient Re[facility]  Lymphedema Life Impact Scale Total Score  70.59 %           [facility] Adult PT Treatment/Exercise - 06/09/19 0001      Manual Therapy   Manual Therapy  Compression Bandaging    Compression Bandaging  Pt performed self wrapping initiatlly with physical therapist oversight demonstrating good and safe technique for wrapping the LLE to the groin. Pt was wrapped with TG soft base layer, 1x 2 inch folded elastomull at the toes, 3 artiflex for shaping, 1x 6 cm for roman sandal, 1x 8 cm for ankle-sole-heel, 2x 10 cm and 1 x 12 cm spiral pattern upward from knee  to groin. Demonstration with return demonstration and then physical therapist wrap for each section including the toes. VC for direction, over lapping by half, figure 8 and the importance of not creating a tourniquet.              PT Education - 06/09/19 1404    Education Details  Pt educated on self wrapping with pt performing demonstration with physical therapist oversight. Pt performed safe, appropriate technique when wrapping from foot to groin. Discussed the importance of continueing to wrap until she receives her garment in order to decrease edema in the Bil LE. Discussed POC and that pt will return for 1 more visit to make sure she is able to don her garment and continues with good home management.    Person(s) Educated  Patient    Methods  Explanation    Comprehension  Verbalized understanding       PT Short Term Goals - 06/09/19 1357      PT SHORT TERM GOAL #1   Title  Pt will be independent with lymphatic facilitation exercises within 2 weeks.    Baseline  Pt reports that she is performing her exercises at home.    Status  Achieved      PT SHORT TERM GOAL #2   Title  Pt will be independent with self MLD to demonstrate autonomy of care    Baseline  Pt is independent with self MLD and has a vasopneumatic pump that she uses daily.    Status  Achieved        PT Long Term Goals - 06/09/19 1357      PT LONG TERM GOAL #1   Title  Pt will report 1/10 pain or less within 6 weeks in order to promote mobility and increase gait ability within    Baseline  Pt reports 0/10 pain at this time in her legs.    Status  Achieved      PT LONG TERM GOAL #2   Title  Patient will have reduction of LLE limb  girth by 3 cm or greater and RLE lower leg limb girth by atleast 2 cm within 6 weeks to demonstrate decrease lymphatic  build up and decrease risk for infection.    Baseline  See measurements pt has improved by 4 cm at the L popliteal crease and 2.5 cm at the R popliteal crease.     Status  Achieved      PT LONG TERM GOAL #3   Title  Patient to be properly fitted with compression garment to wear on daily basis    Baseline  Pt was measured today for her compression garment but has not yet received it.    Status  On-going            Plan - 06/09/19 1304    Clinical Impression Statement  Pt was able to demonstrate wrapping of the LLE with good recall requiring only 1x cue for artiflex foam under the short stretch bandages but pt was performing completely from memory. She currenty is using her vasopneumatic pump at home daily and is wrapping daily using good technique. She was measured today before her appointment for LLE flat knee thigh high and RLE flat knee knee high compresison garments which she has not yet recieved. At this time she will continue home management and return to physical therapy in 2-3 weeks in order to ensure that home management is successful and that she has received her compression garment.    Personal Factors and Comorbidities  Comorbidity 1    PT Frequency  [place]    PT Duration  4 weeks    PT Treatment/Interventions  Cryotherapy;Moist Heat;Gait training;Stair training;Functional mobility training;Therapeutic activities;Therapeutic exercise;Balance training;Neuromuscular re-education;Patient/family education;Manual techniques;Manual lymph drainage;Compression bandaging    PT Next Visit Plan  Ask if she has recieved her flat knit compression, overview exercises and ask about compression pump.    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 Nausea without vomiting 05/23/2019  . Goals of care, counseling/discussion 05/12/2019  . Lymphedema   . 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[place]) 04/21/2019  . Hip joint replacement status 07/13/2016  . Primary osteoarthritis of right hip 06/01/2016  . Endometrioid adenocarcinoma of uterus ([place]) 06/03/2015  . Diarrhea 06/03/2015    Ander Purpura, PT 06/09/2019, 2:06 PM  [place], [place], [address] Phone: [phone]   Fax:  [phone]  Name: KATIE RUFF MRN: [id number] Date of Birth: 1951/05/12

## 2019-06-09 NOTE — Telephone Encounter (Signed)
Dana [name] and discussed tumor board recommendations and scheduled a virtual mychart visit tomorrow at 10:15.  Also discussed that her labs for tomorrow will be canceled and her infusion on Friday will be rescheduled.

## 2019-06-09 NOTE — Telephone Encounter (Signed)
Contacted patient to verify mychart visit for pre reg 

## 2019-06-09 NOTE — Telephone Encounter (Signed)
Called patient regarding providers request, informed patient this will be a virtual visit and they will receive an e-mail. Patient is notified.

## 2019-06-09 NOTE — Addendum Note (Signed)
Addended by: Ander Purpura on: 06/09/2019 03:10 PM   Modules accepted: Orders

## 2019-06-10 ENCOUNTER — Encounter: Payer: Self-pay | Admitting: Hematology and Oncology

## 2019-06-10 ENCOUNTER — Other Ambulatory Visit: Payer: 59

## 2019-06-10 ENCOUNTER — Inpatient Hospital Stay (HOSPITAL_BASED_OUTPATIENT_CLINIC_OR_DEPARTMENT_OTHER): Payer: 59 | Admitting: Hematology and Oncology

## 2019-06-10 ENCOUNTER — Ambulatory Visit: Payer: 59

## 2019-06-10 ENCOUNTER — Ambulatory Visit: Payer: 59 | Admitting: Hematology and Oncology

## 2019-06-10 DIAGNOSIS — C55 Malignant neoplasm of uterus, part unspecified: Secondary | ICD-10-CM

## 2019-06-10 DIAGNOSIS — R197 Diarrhea, unspecified: Secondary | ICD-10-CM | POA: Diagnosis not present

## 2019-06-10 DIAGNOSIS — R5381 Other malaise: Secondary | ICD-10-CM | POA: Insufficient documentation

## 2019-06-10 MED ORDER — DIPHENOXYLATE-ATROPINE 2.5-0.025 MG PO TABS: 1.0000 | ORAL_TABLET | Freq: Four times a day (QID) | ORAL | 0 refills | Status: DC | PRN

## 2019-06-10 NOTE — Assessment & Plan Note (Signed)
She is quite disabled from side effects of treatment She is immunocompromised I gave her a letter so that she can avoid excessive exposure and going to court

## 2019-06-10 NOTE — Assessment & Plan Note (Addendum)
Her case was recently discussed at the GYN oncology tumor board I explained to the patient why surgery is not indicated as explained by Dr. Denman George recently Due to side effects of treatment, the patient has made informed decision not to continue concurrent chemoradiation therapy Per tumor board discussion, I will wait until the patient is almost back to her baseline before repeating CT imaging and further discussion about the role of future chemotherapy She agreed

## 2019-06-10 NOTE — Progress Notes (Signed)
HEMATOLOGY-ONCOLOGY ELECTRONIC VISIT PROGRESS NOTE  Patient Care Team: Shon Baton, MD as PCP - General (Internal Medicine)  I connected with by [facility] video conference and verified that I am speaking with the correct person using two identifiers.  I discussed the limitations, risks, security and privacy concerns of performing an evaluation and management service by [facility] and the availability of in person appointments.  I also discussed with the patient that there may be a patient responsible charge related to this service. The patient expressed understanding and agreed to proceed.   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 Denman George recently Due to side effects of treatment, the patient has made informed decision not to continue concurrent chemoradiation therapy Per tumor board discussion, I will wait until the patient is almost back to her baseline before repeating CT imaging and further discussion about the role of future chemotherapy She agreed  Diarrhea She has severe, ongoing diarrhea I recommend she take scheduled Lomotil 4 times a day I refill her prescription  Physical debility She is quite disabled from side effects of treatment She is immunocompromised I gave her a letter so that she can avoid excessive exposure and going to court   No orders of the defined types were placed in this encounter.   INTERVAL HISTORY: Please see below for problem oriented charting. 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  The patient's initial surgery was with Dr. Marti Sleigh in [place] on January 05, 2014.  It included an exploratory laparotomy with TAH, BSO, appendectomy, pelvic and para-aortic lymphadenectomy and wedge resection of a concerning appearing liver lesion.  Preoperatively she presented with a 26 cm right adnexal mass and an elevated Ca1 25 at 404 units/mL.  At the completion of surgery there was no gross residual disease.  Final pathology showed a primary grade 3 endometrial adenocarcinoma (endometrioid) involving both ovaries.  Lymph nodes and other biopsies were negative.  She had uncomplicated postoperative course.  She was treated with 6 cycles of carboplatin paclitaxel chemotherapy.  She was recommended adjuvant radiation therapy however declined this after counseling.  Following her initial therapy she developed severe left lower extremity lymphedema.   Follow-up CT scan in March 2016 showed no evidence of disease.  She was followed every 3 months for a total of 5 years with no recurrence appreciated during that time   03/28/2019 Imaging   Ct abdomen and pelvis 1. Enlarged right inguinal lymph node or soft tissue mass, measuring 3.8 x 3.5 cm (series 2, image 69). Additional enlarged right iliac lymph node measuring 1.9 x 1.1 cm (series 2, image 68). Findings are concerning  for malignancy.   2. Status post hysterectomy per stated history of endometrial malignancy.   3. No other findings in the abdomen or pelvis concerning for malignancy or metastatic disease.   4. There are  multiple subcentimeter low-attenuation lesions of the liver, the largest in the left lobe the measuring 7 mm, too small to characterize (series 2, image 24). Attention on follow-up although these are most likely incidental benign small cysts and/or hemangiomata.   5.  Aortic atherosclerosis.   04/10/2019 Pathology Results   A. LYMPH NODE, RIGHT INGUINAL, NEEDLE CORE BIOPSY:  - Metastatic adenocarcinoma.  - See comment.   COMMENT:   The morphology is most consistent with metastatic endometrioid adenocarcinoma.  Immunohistochemistry can be performed if clinically indicated.  There is sufficient tissue for additional testing   04/10/2019 Procedure   Technically successful ultrasound guided biopsy of dominant right inguinal lymph node   04/22/2019 Cancer Staging   Staging form: Corpus Uteri - Carcinoma and Carcinosarcoma, AJCC 8th Edition - Pathologic stage from 04/22/2019: FIGO Stage IVB (rpT3a, pN0, pM1) - Signed by [name], MD on 04/22/2019   04/22/2019 PET scan   1. Two hypermetabolic RIGHT iliac lymph nodes and larger metastatic RIGHT inguinal lymph node. 2. No evidence of metastatic disease outside the pelvis. 3. No clear evidence of activity at the vaginal cuff.   05/15/2019 Procedure   Placement of a subcutaneous port device. Catheter tip in the lower SVC   05/16/2019 - 06/12/2019 Chemotherapy   The patient had cisplatin for chemotherapy treatment.      Genetic Testing   Patient has genetic testing done for MSI. Results revealed patient has the following: MSI Stable     REVIEW OF SYSTEMS:   Constitutional: Denies fevers, chills or abnormal weight loss Eyes: Denies blurriness of vision Ears, nose, mouth, throat, and face: Denies mucositis or sore throat Respiratory: Denies cough, dyspnea or wheezes Cardiovascular: Denies palpitation, chest discomfort Skin: Denies abnormal skin rashes Lymphatics: Denies new lymphadenopathy or easy bruising Neurological:Denies numbness,  tingling or new weaknesses Behavioral/Psych: Mood is stable, no new changes  Extremities: No lower extremity edema All other systems were reviewed with the patient and are negative.  I have reviewed the past medical history, past surgical history, social history and family history with the patient and they are unchanged from previous note.  ALLERGIES:  is allergic to oxycodone; garlic; other; and penicillins.  MEDICATIONS:  Current Outpatient Medications  Medication Sig Dispense Refill   amLODipine (NORVASC) 5 MG tablet Take 5 mg by mouth daily.      diphenoxylate-atropine (LOMOTIL) 2.5-0.025 MG tablet Take 1 tablet by mouth 4 (four) times daily as needed for diarrhea or loose stools. 60 tablet 0   ibuprofen (ADVIL) 200 MG tablet Take 200 mg by mouth every 6 (six) hours as needed.     lidocaine-prilocaine (EMLA) cream Apply to affected area once 30 g 3   ondansetron (ZOFRAN) 8 MG tablet Take 1 tablet (8 mg total) by mouth every 8 (eight) hours as needed. 30 tablet 1   prochlorperazine (COMPAZINE) 10 MG tablet Take 1 tablet (10 mg total) by mouth every 6 (six) hours as needed (Nausea or vomiting). 30 tablet 1   traMADol (ULTRAM) 50 MG tablet TK 1 T PO  BID PRF PAINS.     No current facility-administered medications for this visit.     PHYSICAL EXAMINATION: ECOG PERFORMANCE STATUS: 1 - Symptomatic but completely ambulatory  LABORATORY DATA:  I have reviewed the data as listed CMP  Latest Ref Rng & Units 05/22/2019 05/15/2019 07/14/2016  Glucose 70 - 99 mg/dL 102(H) 96 113(H)  BUN 8 - 23 mg/dL 9 12 15   Creatinine 0.44 - 1.00 mg/dL 0.79 0.89 0.92  Sodium 135 - 145 mmol/L 133(L) 137 137  Potassium 3.5 - 5.1 mmol/L 3.7 3.7 3.7  Chloride 98 - 111 mmol/L 97(L) 105 106  CO2 22 - 32 mmol/L 26 22 23   Calcium 8.9 - 10.3 mg/dL 8.3(L) 8.8(L) 8.0(L)  Total Protein 6.5 - 8.1 g/dL 6.1(L) - -  Total Bilirubin 0.3 - 1.2 mg/dL 0.4 - -  Alkaline Phos 38 - 126 U/L 84 - -  AST 15 - 41 U/L 10(L)  - -  ALT 0 - 44 U/L 10 - -    Lab Results  Component Value Date   WBC 5.4 05/22/2019   HGB 13.6 05/22/2019   HCT 40.3 05/22/2019   MCV 90.0 05/22/2019   PLT 231 05/22/2019   NEUTROABS 3.1 05/22/2019     RADIOGRAPHIC STUDIES: I have personally reviewed the radiological images as listed and agreed with the findings in the report. Ir Imaging Guided Port Insertion  Result Date: 05/15/2019 INDICATION: 68 year old with metastatic endometrial cancer. Port-A-Cath needed for chemotherapy. EXAM: FLUOROSCOPIC AND ULTRASOUND GUIDED PLACEMENT OF A SUBCUTANEOUS PORT COMPARISON:  None. MEDICATIONS: Clindamycin 900 mg; The antibiotic was administered within an appropriate time interval prior to skin puncture. ANESTHESIA/SEDATION: Versed 3.0 mg IV; Fentanyl 100 mcg IV; Moderate Sedation Time:  31 minutes The patient was continuously monitored during the procedure by the interventional radiology nurse under my direct supervision. FLUOROSCOPY TIME:  30 seconds, 3 mGy COMPLICATIONS: None immediate. PROCEDURE: The procedure, risks, benefits, and alternatives were explained to the patient. Questions regarding the procedure were encouraged and answered. The patient understands and consents to the procedure. Patient was placed supine on the interventional table. Ultrasound confirmed a patent right internal jugular vein. Ultrasound image was saved for documentation. The right chest and neck were cleaned with a skin antiseptic and a sterile drape was placed. Maximal barrier sterile technique was utilized including caps, mask, sterile gowns, sterile gloves, sterile drape, hand hygiene and skin antiseptic. The right neck was anesthetized with 1% lidocaine. Small incision was made in the right neck with a blade. Micropuncture set was placed in the right internal jugular vein with ultrasound guidance. The micropuncture wire was used for measurement purposes. The right chest was anesthetized with 1% lidocaine with epinephrine.  #15 blade was used to make an incision and a subcutaneous port pocket was formed. Radnor was assembled. Subcutaneous tunnel was formed with a stiff tunneling device. The port catheter was brought through the subcutaneous tunnel. The port was placed in the subcutaneous pocket. The micropuncture set was exchanged for a peel-away sheath. The catheter was placed through the peel-away sheath and the tip was positioned in the lower SVC. Catheter placement was confirmed with fluoroscopy. The port was accessed and flushed with heparinized saline. The port pocket was closed using two layers of absorbable sutures and Dermabond. The vein skin site was closed using a single layer of absorbable suture and Dermabond. Sterile dressings were applied. Patient tolerated the procedure well without an immediate complication. Ultrasound and fluoroscopic images were taken and saved for this procedure. IMPRESSION: Placement of a subcutaneous port device. Catheter tip in the lower SVC. Electronically Signed   By: Markus Daft M.D.   On: 05/15/2019 14:49    I discussed the assessment and treatment plan with the patient.  The patient was provided an opportunity to ask questions and all were answered. The patient agreed with the plan and demonstrated an understanding of the instructions. The patient was advised to call back or seek an in-person evaluation if the symptoms worsen or if the condition fails to improve as anticipated.    I spent 15 minutes counseling the patient face to face. The total time spent in the appointment was 20 minutes and more than 50% was on counseling and review of test results  [name], MD 06/10/2019 12:57 PM

## 2019-06-10 NOTE — Assessment & Plan Note (Signed)
She has severe, ongoing diarrhea I recommend she take scheduled Lomotil 4 times a day I refill her prescription

## 2019-06-11 ENCOUNTER — Ambulatory Visit: Payer: 59

## 2019-06-11 ENCOUNTER — Ambulatory Visit: Payer: 59 | Admitting: Hematology and Oncology

## 2019-06-13 ENCOUNTER — Ambulatory Visit: Payer: 59

## 2019-06-16 ENCOUNTER — Ambulatory Visit: Payer: 59

## 2019-06-17 ENCOUNTER — Telehealth: Payer: Self-pay

## 2019-06-17 ENCOUNTER — Other Ambulatory Visit: Payer: Self-pay | Admitting: Hematology and Oncology

## 2019-06-17 ENCOUNTER — Telehealth: Payer: Self-pay | Admitting: Hematology and Oncology

## 2019-06-17 ENCOUNTER — Ambulatory Visit: Payer: 59

## 2019-06-17 DIAGNOSIS — C55 Malignant neoplasm of uterus, part unspecified: Secondary | ICD-10-CM

## 2019-06-17 NOTE — Telephone Encounter (Signed)
Can you call her back and ask when she feels ready for repeat scan and discuss chemo? If she is ready now, I will order CT next week and see her to discuss If she is not ready and want to wait a bit, she needs to call us back and let me know It is not advisable to order scan and then patient is not ready for treatment

## 2019-06-17 NOTE — Telephone Encounter (Signed)
Left message re 12/7 and 12/8 appointments. Patient also mychart active.

## 2019-06-17 NOTE — Telephone Encounter (Signed)
Called and left the below message. Ask her to call the office back.

## 2019-06-17 NOTE — Telephone Encounter (Signed)
Schedule mailed.  °

## 2019-06-17 NOTE — Telephone Encounter (Signed)
Called and given below message. She is feeling good. Eating and drinking with no problems. Has 2-3 bm's a day. Stools are more formed. She takes Pepto bismol 2 tablets once a day. She will have loose stools is she does not eat a bland diet. She is trying to follow a bland diet. Instructed to call the office if needed. She verbalized understanding.

## 2019-06-17 NOTE — Telephone Encounter (Signed)
Called and given below message. She verbalized understanding.  She is ready for scan next week and see Dr. Alvy Bimler to discuss.

## 2019-06-17 NOTE — Telephone Encounter (Signed)
I ordered Ct for 12/7 and see me the next day Please help get Ct scheduled

## 2019-06-17 NOTE — Telephone Encounter (Signed)
-----   Message from [name], MD sent at 06/17/2019  8:47 AM EST ----- Regarding: how is she doing? is diarrhea resolved?

## 2019-06-18 ENCOUNTER — Ambulatory Visit: Payer: 59

## 2019-06-19 ENCOUNTER — Telehealth: Payer: Self-pay | Admitting: Oncology

## 2019-06-19 ENCOUNTER — Ambulatory Visit: Payer: 59

## 2019-06-19 NOTE — Telephone Encounter (Signed)
Dana [name] called and asked about the contrast for the CT on Monday.  She said the barium contrast gave her stomach problems for 2 days in the past.  For her last CT, she had water based contrast and said she didn't have any stomach issues.  Advised her that she can get water based contrast on Monday as well.  Rescheduled her lab for 10 am at the [facility] and advised her that she will need to arrive for the CT scan at 10:30.  She verbalized understanding and agreement.

## 2019-06-20 ENCOUNTER — Ambulatory Visit: Payer: 59

## 2019-06-23 ENCOUNTER — Other Ambulatory Visit: Payer: Self-pay

## 2019-06-23 ENCOUNTER — Other Ambulatory Visit: Payer: 59

## 2019-06-23 ENCOUNTER — Inpatient Hospital Stay: Payer: 59 | Attending: Gynecologic Oncology

## 2019-06-23 ENCOUNTER — Ambulatory Visit (HOSPITAL_COMMUNITY)
Admission: RE | Admit: 2019-06-23 | Discharge: 2019-06-23 | Disposition: A | Discharge status: Home / Self Care | Payer: 59 | Source: Ambulatory Visit | Attending: Hematology and Oncology | Admitting: Hematology and Oncology

## 2019-06-23 ENCOUNTER — Encounter (HOSPITAL_COMMUNITY): Payer: Self-pay

## 2019-06-23 ENCOUNTER — Ambulatory Visit: Payer: 59

## 2019-06-23 DIAGNOSIS — I890 Lymphedema, not elsewhere classified: Secondary | ICD-10-CM | POA: Insufficient documentation

## 2019-06-23 DIAGNOSIS — Z79899 Other long term (current) drug therapy: Secondary | ICD-10-CM | POA: Diagnosis not present

## 2019-06-23 DIAGNOSIS — C541 Malignant neoplasm of endometrium: Secondary | ICD-10-CM | POA: Insufficient documentation

## 2019-06-23 DIAGNOSIS — R197 Diarrhea, unspecified: Secondary | ICD-10-CM | POA: Diagnosis not present

## 2019-06-23 DIAGNOSIS — C55 Malignant neoplasm of uterus, part unspecified: Secondary | ICD-10-CM | POA: Diagnosis not present

## 2019-06-23 DIAGNOSIS — R5381 Other malaise: Secondary | ICD-10-CM | POA: Insufficient documentation

## 2019-06-23 LAB — COMPREHENSIVE METABOLIC PANEL
ALT: 14 U/L (ref 0–44)
AST: 16 U/L (ref 15–41)
Albumin: 3.6 g/dL (ref 3.5–5.0)
Alkaline Phosphatase: 88 U/L (ref 38–126)
Anion gap: 9 (ref 5–15)
BUN: 8 mg/dL (ref 8–23)
CO2: 26 mmol/L (ref 22–32)
Calcium: 9 mg/dL (ref 8.9–10.3)
Chloride: 104 mmol/L (ref 98–111)
Creatinine, Ser: 0.79 mg/dL (ref 0.44–1.00)
GFR calc Af Amer: >60 mL/min (ref >60)
GFR calc non Af Amer: >60 mL/min (ref >60)
Glucose, Bld: 96 mg/dL (ref 70–99)
Potassium: 4.3 mmol/L (ref 3.5–5.1)
Sodium: 139 mmol/L (ref 135–145)
Total Bilirubin: 0.4 mg/dL (ref 0.3–1.2)
Total Protein: 6.6 g/dL (ref 6.5–8.1)

## 2019-06-23 LAB — CBC WITH DIFFERENTIAL/PLATELET
Abs Immature Granulocytes: 0.01 10*3/uL (ref 0.00–0.07)
Basophils Absolute: 0 10*3/uL (ref 0.0–0.1)
Basophils Relative: 1 %
Eosinophils Absolute: 0.2 10*3/uL (ref 0.0–0.5)
Eosinophils Relative: 4 %
HCT: 40.9 % (ref 36.0–46.0)
Hemoglobin: 13.7 g/dL (ref 12.0–15.0)
Immature Granulocytes: 0 %
Lymphocytes Relative: 16 %
Lymphs Abs: 0.7 10*3/uL (ref 0.7–4.0)
MCH: 31 pg (ref 26.0–34.0)
MCHC: 33.5 g/dL (ref 30.0–36.0)
MCV: 92.5 fL (ref 80.0–100.0)
Monocytes Absolute: 0.5 10*3/uL (ref 0.1–1.0)
Monocytes Relative: 12 %
Neutro Abs: 2.9 10*3/uL (ref 1.7–7.7)
Neutrophils Relative %: 67 %
Platelets: 242 10*3/uL (ref 150–400)
RBC: 4.42 MIL/uL (ref 3.87–5.11)
RDW: 14.5 % (ref 11.5–15.5)
WBC: 4.3 10*3/uL (ref 4.0–10.5)
nRBC: 0 % (ref 0.0–0.2)

## 2019-06-23 LAB — MAGNESIUM: Magnesium: 1.7 mg/dL (ref 1.7–2.4)

## 2019-06-23 MED ORDER — IOHEXOL 9 MG/ML PO SOLN
500.0000 mL | ORAL | Status: AC
  Administered 2019-06-23: 500 mL via ORAL

## 2019-06-23 MED ORDER — IOHEXOL 300 MG/ML  SOLN
100.0000 mL | Freq: Once | INTRAMUSCULAR | Status: AC | PRN
  Administered 2019-06-23: 100 mL via INTRAVENOUS

## 2019-06-23 MED ORDER — IOHEXOL 9 MG/ML PO SOLN
ORAL | Status: AC
  Filled 2019-06-23: qty 1000

## 2019-06-23 MED ORDER — SODIUM CHLORIDE (PF) 0.9 % IJ SOLN
INTRAMUSCULAR | Status: AC
  Filled 2019-06-23: qty 50

## 2019-06-24 ENCOUNTER — Other Ambulatory Visit: Payer: Self-pay

## 2019-06-24 ENCOUNTER — Ambulatory Visit: Payer: 59

## 2019-06-24 ENCOUNTER — Inpatient Hospital Stay (HOSPITAL_BASED_OUTPATIENT_CLINIC_OR_DEPARTMENT_OTHER): Payer: 59 | Admitting: Hematology and Oncology

## 2019-06-24 DIAGNOSIS — R5381 Other malaise: Secondary | ICD-10-CM

## 2019-06-24 DIAGNOSIS — C55 Malignant neoplasm of uterus, part unspecified: Secondary | ICD-10-CM

## 2019-06-24 DIAGNOSIS — Z7189 Other specified counseling: Secondary | ICD-10-CM

## 2019-06-24 DIAGNOSIS — C541 Malignant neoplasm of endometrium: Secondary | ICD-10-CM | POA: Diagnosis not present

## 2019-06-25 ENCOUNTER — Telehealth: Payer: Self-pay | Admitting: Oncology

## 2019-06-25 ENCOUNTER — Ambulatory Visit: Payer: 59

## 2019-06-25 ENCOUNTER — Encounter: Payer: Self-pay | Admitting: Hematology and Oncology

## 2019-06-25 NOTE — Progress Notes (Signed)
[facility] OFFICE PROGRESS NOTE  Patient Care Team: Shon Baton, MD as PCP - General (Internal Medicine) Awanda Mink Craige Cotta, RN as Oncology Nurse Navigator (Oncology)  ASSESSMENT & PLAN:  Endometrioid adenocarcinoma of uterus [facility]) I have reviewed CT imaging with the patient She had positive response to therapy with almost 50% regression of the size of the lymphadenopathy She has almost completely recovered from side effects of chemotherapy and radiation We discussed options and I reviewed the current guidelines with her Given low burden of disease, and the endometrioid subtype of disease that was also strong ER/PR positive, I think combination treatment with everolimus/letrozole is a reasonable option.  Alternatively, combination treatment with pembrolizumab/lenvatinib is also a reasonable approach.  Ultimately, aggressive chemotherapy such as carboplatin and Taxol is also available but personally, I will elect for option 1 or 2 and to see if aggressive chemotherapy to the future unless she progresses on the other options The patient is undecided The risks, benefits, side effects of each option were fully discussed with the patient I have addressed all her questions She will go home and think about it and will discuss with family She will get back to me at the end of the week about her final decision  Physical debility She is still quite debilitated from treatment although she is recovering nicely since discontinuation of chemotherapy She will continue physical therapy at the cancer rehab facility When she is ready to commit to starting chemotherapy, I recommend her to be off work for a few more months to observe for toxicity  Goals of care, counseling/discussion We had numerous goals of care discussions in the past Due to recurrence of disease, even though her condition is treatable, it is considered noncurable   No orders of the defined types were placed in this  encounter.   INTERVAL HISTORY: Please see below for problem oriented charting. 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  The patient's initial surgery was with Dr. Marti Sleigh in [place] on January 05, 2014.  It included an exploratory laparotomy with TAH, BSO, appendectomy, pelvic and para-aortic lymphadenectomy and wedge resection of a concerning appearing liver lesion.  Preoperatively she presented with a 26 cm right adnexal mass and an elevated Ca1 25 at 404 units/mL.  At the completion of surgery there was no gross residual disease.  Final pathology showed a primary grade 3 endometrial adenocarcinoma (endometrioid) involving both ovaries.  Lymph nodes and other biopsies were negative.  She had uncomplicated postoperative course.  She was treated with 6 cycles of carboplatin paclitaxel chemotherapy.  She was recommended adjuvant radiation therapy however declined this after counseling.  Following her initial therapy she developed severe left lower extremity lymphedema.   Follow-up CT scan in March 2016 showed no evidence of disease.  She was followed every 3 months for a total of 5 years with no recurrence appreciated during that time   03/28/2019 Imaging   Ct abdomen and pelvis 1. Enlarged  right inguinal lymph node or soft tissue mass, measuring 3.8 x 3.5 cm (series 2, image 69). Additional enlarged right iliac lymph node measuring 1.9 x 1.1 cm (series 2, image 68). Findings  are concerning for malignancy.   2. Status post hysterectomy per stated history of endometrial malignancy.   3. No other findings in the abdomen or pelvis concerning for malignancy or metastatic disease.   4. There are multiple subcentimeter low-attenuation lesions of the liver, the largest in the left lobe the measuring 7 mm, too small to characterize (series 2, image 24). Attention on follow-up although these are most likely incidental benign small cysts and/or hemangiomata.   5.  Aortic atherosclerosis.   04/10/2019 Pathology Results   A. LYMPH NODE, RIGHT INGUINAL, NEEDLE CORE BIOPSY:  - Metastatic adenocarcinoma.  - See comment.   COMMENT:   The morphology is most consistent with metastatic endometrioid adenocarcinoma.  Immunohistochemistry can be performed if clinically indicated.  There is sufficient tissue for additional testing   04/10/2019 Procedure   Technically successful ultrasound guided biopsy of dominant right inguinal lymph node   04/22/2019 Cancer Staging   Staging form: Corpus Uteri - Carcinoma and Carcinosarcoma, AJCC 8th Edition - Pathologic stage from 04/22/2019: FIGO Stage IVB (rpT3a, pN0, pM1) - Signed by [name], MD on 04/22/2019   04/22/2019 PET scan   1. Two hypermetabolic RIGHT iliac lymph nodes and larger metastatic RIGHT inguinal lymph node. 2. No evidence of metastatic disease outside the pelvis. 3. No clear evidence of activity at the vaginal cuff.   05/15/2019 Procedure   Placement of a subcutaneous port device. Catheter tip in the lower SVC   05/16/2019 - 06/12/2019 Chemotherapy   The patient had cisplatin for chemotherapy treatment.      Genetic Testing   Patient has genetic testing done for MSI. Results revealed patient has the following: MSI Stable   06/23/2019 Imaging   1. Interval decreased size of right inguinal and external iliac lymph nodes. 2. No evidence of progressive metastatic disease. 3. Stable small low-density hepatic  lesions, likely cysts. 4. Aortic Atherosclerosis (ICD10-I70.0).       REVIEW OF SYSTEMS:   Constitutional: Denies fevers, chills or abnormal weight loss Eyes: Denies blurriness of vision Ears, nose, mouth, throat, and face: Denies mucositis or sore throat Respiratory: Denies cough, dyspnea or wheezes Cardiovascular: Denies palpitation, chest discomfort or lower extremity swelling Gastrointestinal:  Denies nausea, heartburn or change in bowel habits Skin: Denies abnormal skin rashes Lymphatics: Denies new lymphadenopathy or easy bruising Neurological:Denies numbness, tingling or new weaknesses Behavioral/Psych: Mood is stable, no new changes  All other systems were reviewed with the patient and are negative.  I have reviewed the past medical history, past surgical history, social history and family history with the patient and they are unchanged from previous note.  ALLERGIES:  is allergic to oxycodone; garlic; other; and penicillins.  MEDICATIONS:  Current Outpatient Medications  Medication Sig Dispense Refill  . amLODipine (NORVASC) 5 MG tablet Take 5 mg by mouth daily.     . diphenoxylate-atropine (LOMOTIL) 2.5-0.025 MG tablet Take 1 tablet by mouth 4 (four) times daily as needed for diarrhea or loose stools. 60 tablet 0  . ibuprofen (ADVIL) 200 MG tablet Take 200 mg by mouth every 6 (six) hours as needed.    . lidocaine-prilocaine (EMLA) cream Apply to affected area once 30 g 3  . ondansetron (ZOFRAN) 8 MG tablet Take 1 tablet (8 mg total) by mouth every 8 (eight) hours as needed.  30 tablet 1  . prochlorperazine (COMPAZINE) 10 MG tablet Take 1 tablet (10 mg total) by mouth every 6 (six) hours as needed (Nausea or vomiting). 30 tablet 1  . traMADol (ULTRAM) 50 MG tablet TK 1 T PO  BID PRF PAINS.     No current facility-administered medications for this visit.     PHYSICAL EXAMINATION: ECOG PERFORMANCE STATUS: 1 - Symptomatic but completely ambulatory  Vitals:   06/24/19  1254  BP: 124/74  Pulse: 76  Resp: 18  Temp: (!) 97.3 F (36.3 C)  SpO2: 98%   Filed Weights   06/24/19 1254  Weight: 175 lb (79.4 kg)    GENERAL:alert, no distress and comfortable Musculoskeletal:no cyanosis of digits and no clubbing  NEURO: alert & oriented x 3 with fluent speech, no focal motor/sensory deficits  LABORATORY DATA:  I have reviewed the data as listed    Component Value Date/Time   NA 139 06/23/2019 0956   K 4.3 06/23/2019 0956   CL 104 06/23/2019 0956   CO2 26 06/23/2019 0956   GLUCOSE 96 06/23/2019 0956   BUN 8 06/23/2019 0956   CREATININE 0.79 06/23/2019 0956   CALCIUM 9.0 06/23/2019 0956   PROT 6.6 06/23/2019 0956   ALBUMIN 3.6 06/23/2019 0956   AST 16 06/23/2019 0956   ALT 14 06/23/2019 0956   ALKPHOS 88 06/23/2019 0956   BILITOT 0.4 06/23/2019 0956   GFRNONAA >60 06/23/2019 0956   GFRAA >60 06/23/2019 0956    No results found for: SPEP, UPEP  Lab Results  Component Value Date   WBC 4.3 06/23/2019   NEUTROABS 2.9 06/23/2019   HGB 13.7 06/23/2019   HCT 40.9 06/23/2019   MCV 92.5 06/23/2019   PLT 242 06/23/2019      Chemistry      Component Value Date/Time   NA 139 06/23/2019 0956   K 4.3 06/23/2019 0956   CL 104 06/23/2019 0956   CO2 26 06/23/2019 0956   BUN 8 06/23/2019 0956   CREATININE 0.79 06/23/2019 0956      Component Value Date/Time   CALCIUM 9.0 06/23/2019 0956   ALKPHOS 88 06/23/2019 0956   AST 16 06/23/2019 0956   ALT 14 06/23/2019 0956   BILITOT 0.4 06/23/2019 0956       RADIOGRAPHIC STUDIES: I have reviewed multiple imaging studies with the patient I have personally reviewed the radiological images as listed and agreed with the findings in the report. Ct Abdomen Pelvis W Contrast  Result Date: 06/23/2019 CLINICAL DATA:  Endometrial cancer diagnosed in 2015 with recurrence earlier this year. Chemotherapy and radiation therapy completed. Patient reports right lower quadrant abdominal tenderness with nausea and  diarrhea following radiation therapy. EXAM: CT ABDOMEN AND PELVIS WITH CONTRAST TECHNIQUE: Multidetector CT imaging of the abdomen and pelvis was performed using the standard protocol following bolus administration of intravenous contrast. CONTRAST:  129m OMNIPAQUE IOHEXOL 300 MG/ML  SOLN COMPARISON:  CT 03/28/2019. PET-CT 04/22/2019. FINDINGS: Lower chest: Clear lung bases. No significant pleural or pericardial effusion. Stable small hiatal hernia. Hepatobiliary: Several subcentimeter low-density hepatic lesions are stable, likely cysts. No new or enlarging hepatic lesions. No evidence of gallstones, gallbladder wall thickening or biliary dilatation. Pancreas: Unremarkable. No pancreatic ductal dilatation or surrounding inflammatory changes. Spleen: Normal in size without focal abnormality. Adrenals/Urinary Tract: Both adrenal glands appear normal. There are small left renal parapelvic cysts. No evidence of urinary tract calculus or hydronephrosis. The bladder is incompletely distended and partially obscured by artifact from the right  total hip arthroplasty, although demonstrates no abnormality. Stomach/Bowel: No evidence of bowel wall thickening, distention or surrounding inflammatory change. Moderate sigmoid diverticulosis. Vascular/Lymphatic: There are no enlarged abdominal lymph nodes. The previously demonstrated right inguinal mass/node is smaller, measuring 19 x 16 mm on image 69/2 (previously 30 x 35 mm). A small right external iliac node is also slightly smaller, with a short axis dimension of 8 mm on image 69/2. These were both hypermetabolic on previous PET-CT. Mild aortic and branch vessel atherosclerosis. No acute vascular findings. Reproductive: Hysterectomy. No evidence of adnexal mass. The inferior right pelvis is partly obscured by artifact from the right total hip arthroplasty. Other: Small supraumbilical hernia containing only fat. No ascites or peritoneal nodularity. Musculoskeletal: No acute or  significant osseous findings. Degenerative and postsurgical changes in the lower lumbar spine. Previous right total hip arthroplasty. IMPRESSION: 1. Interval decreased size of right inguinal and external iliac lymph nodes. 2. No evidence of progressive metastatic disease. 3. Stable small low-density hepatic lesions, likely cysts. 4. Aortic Atherosclerosis (ICD10-I70.0). Electronically Signed   By: Richardean Sale M.D.   On: 06/23/2019 15:35    All questions were answered. The patient knows to call the clinic with any problems, questions or concerns. No barriers to learning was detected.  I spent 30 minutes counseling the patient face to face. The total time spent in the appointment was 40 minutes and more than 50% was on counseling and review of test results  [name], MD 06/25/2019 9:40 AM

## 2019-06-25 NOTE — Assessment & Plan Note (Signed)
She is still quite debilitated from treatment although she is recovering nicely since discontinuation of chemotherapy She will continue physical therapy at the cancer rehab facility When she is ready to commit to starting chemotherapy, I recommend her to be off work for a few more months to observe for toxicity

## 2019-06-25 NOTE — Assessment & Plan Note (Signed)
We had numerous goals of care discussions in the past Due to recurrence of disease, even though her condition is treatable, it is considered noncurable

## 2019-06-25 NOTE — Telephone Encounter (Signed)
Dana [name] called and had questions about why her treatment options have changed.  Discussed that her CT results from 12/7 showed positive results per Dr. Alvy Bimler and that her biopsy from 04/10/19 was strongly ER/PR positive which gives her some different treatment options than originally recommended.  She also wondered what she needs to do with her port since she will not be getting chemotherapy right away.  Advised her that flush appointments will be scheduled for every 6-8 weeks until it is removed.    She is also concerned about the cost of the treatments since she will be switching insurance carriers in January.  Advised her that the treatments will be authorized with her insurance before she starts treatments.  She said she is leaning away from the immunotherapy treatments because she already has trouble with her thyroid and hypertension.  Advised her that I will call her tomorrow to see if she has anymore questions.

## 2019-06-25 NOTE — Assessment & Plan Note (Signed)
I have reviewed CT imaging with the patient She had positive response to therapy with almost 50% regression of the size of the lymphadenopathy She has almost completely recovered from side effects of chemotherapy and radiation We discussed options and I reviewed the current guidelines with her Given low burden of disease, and the endometrioid subtype of disease that was also strong ER/PR positive, I think combination treatment with everolimus/letrozole is a reasonable option.  Alternatively, combination treatment with pembrolizumab/lenvatinib is also a reasonable approach.  Ultimately, aggressive chemotherapy such as carboplatin and Taxol is also available but personally, I will elect for option 1 or 2 and to see if aggressive chemotherapy to the future unless she progresses on the other options The patient is undecided The risks, benefits, side effects of each option were fully discussed with the patient I have addressed all her questions She will go home and think about it and will discuss with family She will get back to me at the end of the week about her final decision

## 2019-06-26 ENCOUNTER — Ambulatory Visit: Payer: 59

## 2019-06-26 NOTE — Telephone Encounter (Addendum)
Dana [name] called back and said she has done some research and has decided on the antiestrogen treatment (erolimus/letrozole). She is concerned about hypertension and thyroid issues with immunotherapy. Advised her I would notify Dr. Alvy Bimler.

## 2019-06-27 ENCOUNTER — Other Ambulatory Visit: Payer: Self-pay | Admitting: Hematology and Oncology

## 2019-06-27 ENCOUNTER — Ambulatory Visit: Payer: 59

## 2019-06-27 ENCOUNTER — Telehealth: Payer: Self-pay | Admitting: Oncology

## 2019-06-27 ENCOUNTER — Telehealth: Payer: Self-pay | Admitting: Pharmacist

## 2019-06-27 ENCOUNTER — Telehealth: Payer: Self-pay | Admitting: Hematology and Oncology

## 2019-06-27 DIAGNOSIS — C55 Malignant neoplasm of uterus, part unspecified: Secondary | ICD-10-CM

## 2019-06-27 MED ORDER — EVEROLIMUS 10 MG PO TABS: 10.0000 mg | ORAL_TABLET | Freq: Every day | ORAL | 11 refills | Status: DC

## 2019-06-27 MED ORDER — LETROZOLE 2.5 MG PO TABS: 2.5000 mg | ORAL_TABLET | Freq: Every day | ORAL | 3 refills | Status: DC

## 2019-06-27 MED FILL — LETROZOLE 2.5 MG TABLET: 2.5 | 30 days supply | Qty: 30 | Fill #0

## 2019-06-27 NOTE — Telephone Encounter (Signed)
I sent prescription and staff message

## 2019-06-27 NOTE — Telephone Encounter (Signed)
Scheduled appt per 12/11 sch message - pt aware of appt date and time

## 2019-06-27 NOTE — Telephone Encounter (Signed)
Oral Oncology Pharmacist Encounter  Received new prescription for Afinitor (everolimus)  for the treatment of endometrial cancer in conjunction with letrozole, planned duration until disease progression or unacceptable drug toxicity. Planned start 07/14/2019.  The plan is for her to start taking her letrozole on Monday, 06/30/19 and take for 2 weeks by itself to get an idea of how she will feel with letrozole alone.   CMP from 06/23/2019 assessed, no relevant lab abnormalities. Prescription dose and frequency assessed.   Current medication list in Epic reviewed, no relevant DDIs with everolimus identified:  Oral Oncology Clinic will continue to follow for insurance authorization, copayment issues, initial counseling and start date.  Darl Pikes, PharmD, BCPS, [facility] Hematology/Oncology Clinical Pharmacist [facility] Oral [facility] [phone]  06/27/2019 9:14 AM

## 2019-06-27 NOTE — Telephone Encounter (Signed)
Called [place] and let her know that Dr. Alvy Bimler has sent a prescription for letrozole to the [facility].  She should start taking it on Monday, 06/30/19 and take for 2 weeks by itself to get an idea of how she will feel with letrozole alone. She verbalized understanding of instructions.  Also discussed that we will do a prior authorization with her insurance for affinitor and will let her know when it is approved/when to start it.  [place] said that her insurance will be changing in January.  She does not have the new insurance information yet but it will be Medicare part B possibly through [employer].  Advised her that the schedulers will be calling her to set up an appointment for labs and follow up with Dr. Alvy Bimler the first week in January.  Discussed short term disability paperwork and she said she is out until 07/04/19 and needs to have it extended.  Asked if she could have [facility] fax [place] a new form.  She is going to find the phone number for them and call back.

## 2019-06-30 ENCOUNTER — Ambulatory Visit: Payer: 59

## 2019-06-30 MED ORDER — EVEROLIMUS 10 MG PO TABS: 10.0000 mg | ORAL_TABLET | Freq: Every day | ORAL | 11 refills | Status: DC

## 2019-06-30 NOTE — Telephone Encounter (Signed)
Oral Chemotherapy Pharmacist Encounter  Due to insurance restriction the medication could not be filled at [facility]. Prescription has been e-scribed to [facility].  Supportive information was faxed to [facility]. We will continue to follow medication access.   Patient notified that prescription was sent to [facility].  Darl Pikes, PharmD, BCPS, [facility] Hematology/Oncology Clinical Pharmacist [facility] Oral [facility] [phone]  06/30/2019 1:26 PM

## 2019-07-01 ENCOUNTER — Ambulatory Visit: Payer: 59

## 2019-07-02 ENCOUNTER — Ambulatory Visit: Payer: 59

## 2019-07-02 ENCOUNTER — Telehealth: Payer: Self-pay | Admitting: Oncology

## 2019-07-02 ENCOUNTER — Other Ambulatory Visit: Payer: Self-pay

## 2019-07-02 ENCOUNTER — Ambulatory Visit: Payer: 59 | Attending: Radiation Oncology

## 2019-07-02 DIAGNOSIS — C774 Secondary and unspecified malignant neoplasm of inguinal and lower limb lymph nodes: Secondary | ICD-10-CM | POA: Diagnosis present

## 2019-07-02 DIAGNOSIS — C55 Malignant neoplasm of uterus, part unspecified: Secondary | ICD-10-CM | POA: Diagnosis present

## 2019-07-02 DIAGNOSIS — R293 Abnormal posture: Secondary | ICD-10-CM | POA: Diagnosis present

## 2019-07-02 DIAGNOSIS — M79605 Pain in left leg: Secondary | ICD-10-CM | POA: Insufficient documentation

## 2019-07-02 DIAGNOSIS — I890 Lymphedema, not elsewhere classified: Secondary | ICD-10-CM | POA: Diagnosis not present

## 2019-07-02 NOTE — Telephone Encounter (Signed)
Dana [name] called and said she needs the paperwork from Dana [name] by 07/04/19.  Notified Roz, RN.  She also said she has not heard from the specialty pharmacy yet. Called Dana [name], Dana [name] and she said we should hear from them at the end of the week.  Called Dana [name] back and let her know.

## 2019-07-02 NOTE — Therapy (Signed)
[place], [place], [address] Phone: [phone]   Fax:  [phone]  Physical Therapy Discharge Note  Patient Details  Name: JAIMY KLIETHERMES MRN: [id number] Date of Birth: 1951-03-03 Referring Provider (PT): Gery Pray   Encounter Date: 07/02/2019  PT End of Session - 07/02/19 1051    Visit Number  9    Number of Visits  15    Date for PT Re-Evaluation  07/07/19    PT Start Time  1050    PT Stop Time  1115    PT Time Calculation (min)  25 min    Activity Tolerance  Patient tolerated treatment well    Behavior During Therapy  [facility] for tasks assessed/performed       Past Medical History:  Diagnosis Date  . Arthritis   . GERD (gastroesophageal reflux disease)   . History of blood transfusion   . Hypothyroidism   . Lymphedema    left leg- ted hose makes it worse  . Thyroid disease   . Uterine cancer [facility])    Endometrial cancer - surgery and chemo    Past Surgical History:  Procedure Laterality Date  . ABDOMINAL HYSTERECTOMY    . APPENDECTOMY    . COLONOSCOPY W/ POLYPECTOMY    . EYE SURGERY Bilateral    cataracts  . IR IMAGING GUIDED PORT INSERTION  05/15/2019  . LIVER BIOPSY    . mass removed     2015 from pelvis  . TOTAL HIP ARTHROPLASTY Right 07/13/2016   Procedure: RIGHT TOTAL HIP ARTHROPLASTY ANTERIOR APPROACH;  Surgeon: Leandrew Koyanagi, MD;  Location: [place];  Service: Orthopedics;  Laterality: Right;    There were no vitals filed for this visit.  Subjective Assessment - 07/02/19 1052    Subjective  Pt reports that she has started a new treatment and her stomach is feeling better. She has started to walk more and has been using her machine atleast 1x/day.    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  5 minutes    Patient Stated Goals  I want to be able to  walk again.    Currently in Pain?  No/denies    Pain Score  0-No pain            LYMPHEDEMA/ONCOLOGY QUESTIONNAIRE - 07/02/19 1100      Right Lower Extremity Lymphedema   20 cm Proximal to Suprapatella  58 cm    10 cm Proximal to Suprapatella  49.9 cm    At Midpatella/Popliteal Crease  37.8 cm    30 cm Proximal to Floor at Lateral Plantar Foot  39.3 cm    20 cm Proximal to Floor at Lateral Plantar Foot  31._0 cm Proximal to Floor at Lateral Malleoli  22 cm    5 cm Proximal to 1st MTP Joint  21 cm    Across MTP Joint  21.8 cm    Around Proximal Great Toe  7.2 cm      Left Lower Extremity Lymphedema   20 cm Proximal to Suprapatella  54.4 cm    10 cm Proximal to Suprapatella  49.5 cm    At Midpatella/Popliteal Crease  42 cm    30 cm Proximal to Floor at Lateral Plantar Foot  44 cm    20 cm Proximal to  Floor at Lateral Plantar Foot  37.7 cm    10 cm Proximal to Floor at Lateral Malleoli  28.7 cm    5 cm Proximal to 1st MTP Joint  22.7 cm    Across MTP Joint  21.3 cm    Around Proximal Great Toe  7.5 cm           Outpatient Rehab from [facility]/30/[facility]  Lymphedema Life Impact Scale Total Score  70.59 %           [facility] Adult PT Treatment/Exercise - 07/02/19 0001      Manual Therapy   Manual Therapy  Edema management    Edema Management  Pt was assisted with donning her compressoin garment. She was shown how to use silky foot piece to easily don the garment. Pt was able to don independently following instruction.              PT Education - 07/02/19 1117    Education Details  Pt was educated on how to don her compression garment. Discussed reasons to return to physical therapy including increased pain or swelling and if her garment is uncomfortable or not wearing correctly she will call before December 28th to be seen by the [facility] representative    Person(s) Educated  Patient    Methods   Explanation;Demonstration;Verbal cues    Comprehension  Verbalized understanding;Returned demonstration       PT Short Term Goals - 07/02/19 1111      PT SHORT TERM GOAL #1   Title  Pt will be independent with lymphatic facilitation exercises within 2 weeks.    Baseline  Pt reports that she is performing her exercises at home she is starting to walk and does pilates    Status  Achieved      PT SHORT TERM GOAL #2   Title  Pt will be independent with self MLD to demonstrate autonomy of care    Baseline  pt continues to use her vasopneumatic pump daily.    Status  Achieved        PT Long Term Goals - 07/02/19 1111      PT LONG TERM GOAL #1   Title  Pt will report 1/10 pain or less within 6 weeks in order to promote mobility and increase gait ability within    Baseline  Pt reports 0/10 pain at this time in her legs.    Status  Achieved      PT LONG TERM GOAL #2   Title  Patient will have reduction of LLE limb  girth by 3 cm or greater and RLE lower leg limb girth by atleast 2 cm within 6 weeks to demonstrate decrease lymphatic build up and decrease risk for infection.    Baseline  Pt has had slight increase in edema in the BLE since not coming to physical therapy but remains greaterthan 2 cm down from initially    Status  Achieved      PT LONG TERM GOAL #3   Title  Patient to be properly fitted with compression garment to wear on daily basis    Baseline  Pt received her flat knit garments for her Bil LE today she was able to don without difficulty.    Status  Achieved            Plan - 07/02/19 1051    Clinical Impression Statement  Pt returns to physical therapy today with her garment. She has  met all of her goals and was assisted with donning her garment. After pt was instructed on how to use silky foot piece to don her thigh high garment she was able to easily don without assistance. Pt is aware of reasons to wear her compression and to never were her flat knit compression  at night. She will be discharged today and will return to her MD for physical therapy referral if she notices any increase in pain or edema in her LE.    Personal Factors and Comorbidities  Comorbidity 1    Comorbidities  Endometrioid cancer    PT Frequency  Monthy    PT Duration  4 weeks    PT Treatment/Interventions  Cryotherapy;Moist Heat;Gait training;Stair training;Functional mobility training;Therapeutic activities;Therapeutic exercise;Balance training;Neuromuscular re-education;Patient/family education;Manual techniques;Manual lymph drainage;Compression bandaging    PT Next Visit Plan  Pt will be discharged today.    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 Physical debility 06/10/2019  . Nausea without vomiting 05/23/2019  . Goals of care, counseling/discussion 05/12/2019  . Lymphedema   . 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[place]) 04/21/2019  . Hip joint replacement status 07/13/2016  . Primary osteoarthritis of right hip 06/01/2016  . Endometrioid adenocarcinoma of uterus ([place]) 06/03/2015  . Diarrhea 06/03/2015    PHYSICAL THERAPY DISCHARGE SUMMARY  Plan: Patient agrees to discharge.  Patient goals were partially met. Patient is being discharged due to meeting the stated rehab goals.  ?????      Ander Purpura, PT 07/02/2019, 11:20 AM  [place], [place], [address] Phone: [phone]   Fax:  [phone]  Name: FELISIA BALCOM MRN: [id number] Date of Birth: Aug 17, 1950

## 2019-07-03 ENCOUNTER — Ambulatory Visit: Payer: 59

## 2019-07-03 ENCOUNTER — Telehealth: Payer: Self-pay

## 2019-07-03 NOTE — Telephone Encounter (Signed)
Spoke with Dana [name] at [facility] who stated they had spoken with patient and scheduled delivery of Afinitor for 07/09/19.  [name] Patient Dana [name] Phone [phone] Fax [phone] 07/03/2019 10:08 AM

## 2019-07-04 ENCOUNTER — Ambulatory Visit: Payer: 59

## 2019-07-07 ENCOUNTER — Ambulatory Visit: Payer: 59

## 2019-07-08 ENCOUNTER — Ambulatory Visit: Payer: 59

## 2019-07-09 ENCOUNTER — Ambulatory Visit: Payer: 59

## 2019-07-10 ENCOUNTER — Ambulatory Visit: Payer: 59

## 2019-07-22 ENCOUNTER — Inpatient Hospital Stay: Payer: Medicare HMO

## 2019-07-22 ENCOUNTER — Other Ambulatory Visit: Payer: Self-pay | Admitting: *Deleted

## 2019-07-22 ENCOUNTER — Inpatient Hospital Stay: Payer: Medicare HMO | Attending: Gynecologic Oncology | Admitting: Hematology and Oncology

## 2019-07-22 ENCOUNTER — Other Ambulatory Visit: Payer: Self-pay

## 2019-07-22 DIAGNOSIS — Z90722 Acquired absence of ovaries, bilateral: Secondary | ICD-10-CM | POA: Insufficient documentation

## 2019-07-22 DIAGNOSIS — C55 Malignant neoplasm of uterus, part unspecified: Secondary | ICD-10-CM | POA: Diagnosis not present

## 2019-07-22 DIAGNOSIS — K1231 Oral mucositis (ulcerative) due to antineoplastic therapy: Secondary | ICD-10-CM | POA: Diagnosis not present

## 2019-07-22 DIAGNOSIS — C541 Malignant neoplasm of endometrium: Secondary | ICD-10-CM | POA: Insufficient documentation

## 2019-07-22 DIAGNOSIS — K769 Liver disease, unspecified: Secondary | ICD-10-CM | POA: Insufficient documentation

## 2019-07-22 DIAGNOSIS — I890 Lymphedema, not elsewhere classified: Secondary | ICD-10-CM

## 2019-07-22 DIAGNOSIS — Z90710 Acquired absence of both cervix and uterus: Secondary | ICD-10-CM | POA: Insufficient documentation

## 2019-07-22 DIAGNOSIS — Z9079 Acquired absence of other genital organ(s): Secondary | ICD-10-CM | POA: Diagnosis not present

## 2019-07-22 DIAGNOSIS — C774 Secondary and unspecified malignant neoplasm of inguinal and lower limb lymph nodes: Secondary | ICD-10-CM | POA: Diagnosis not present

## 2019-07-22 DIAGNOSIS — R5383 Other fatigue: Secondary | ICD-10-CM | POA: Diagnosis not present

## 2019-07-22 DIAGNOSIS — T451X5A Adverse effect of antineoplastic and immunosuppressive drugs, initial encounter: Secondary | ICD-10-CM | POA: Insufficient documentation

## 2019-07-22 LAB — CBC WITH DIFFERENTIAL/PLATELET
Abs Immature Granulocytes: 0.01 10*3/uL (ref 0.00–0.07)
Basophils Absolute: 0 10*3/uL (ref 0.0–0.1)
Basophils Relative: 1 %
Eosinophils Absolute: 0.1 10*3/uL (ref 0.0–0.5)
Eosinophils Relative: 3 %
HCT: 38.3 % (ref 36.0–46.0)
Hemoglobin: 12.9 g/dL (ref 12.0–15.0)
Immature Granulocytes: 0 %
Lymphocytes Relative: 18 %
Lymphs Abs: 0.8 10*3/uL (ref 0.7–4.0)
MCH: 31.3 pg (ref 26.0–34.0)
MCHC: 33.7 g/dL (ref 30.0–36.0)
MCV: 93 fL (ref 80.0–100.0)
Monocytes Absolute: 0.6 10*3/uL (ref 0.1–1.0)
Monocytes Relative: 13 %
Neutro Abs: 2.7 10*3/uL (ref 1.7–7.7)
Neutrophils Relative %: 65 %
Platelets: 230 10*3/uL (ref 150–400)
RBC: 4.12 MIL/uL (ref 3.87–5.11)
RDW: 14.1 % (ref 11.5–15.5)
WBC: 4.2 10*3/uL (ref 4.0–10.5)
nRBC: 0 % (ref 0.0–0.2)

## 2019-07-22 LAB — COMPREHENSIVE METABOLIC PANEL
ALT: 10 U/L (ref 0–44)
AST: 14 U/L — ABNORMAL LOW (ref 15–41)
Albumin: 3.6 g/dL (ref 3.5–5.0)
Alkaline Phosphatase: 112 U/L (ref 38–126)
Anion gap: 11 (ref 5–15)
BUN: 6 mg/dL — ABNORMAL LOW (ref 8–23)
CO2: 25 mmol/L (ref 22–32)
Calcium: 8.8 mg/dL — ABNORMAL LOW (ref 8.9–10.3)
Chloride: 103 mmol/L (ref 98–111)
Creatinine, Ser: 0.75 mg/dL (ref 0.44–1.00)
GFR calc Af Amer: >60 mL/min (ref >60)
GFR calc non Af Amer: >60 mL/min (ref >60)
Glucose, Bld: 91 mg/dL (ref 70–99)
Potassium: 3.7 mmol/L (ref 3.5–5.1)
Sodium: 139 mmol/L (ref 135–145)
Total Bilirubin: 0.3 mg/dL (ref 0.3–1.2)
Total Protein: 6.8 g/dL (ref 6.5–8.1)

## 2019-07-22 LAB — MAGNESIUM: Magnesium: 1.7 mg/dL (ref 1.7–2.4)

## 2019-07-22 MED ORDER — LETROZOLE 2.5 MG PO TABS: 2.5000 mg | ORAL_TABLET | Freq: Every day | ORAL | 3 refills | Status: DC

## 2019-07-22 MED ORDER — SODIUM CHLORIDE 0.9% FLUSH
10.0000 mL | Freq: Once | INTRAVENOUS | Status: AC
  Administered 2019-07-22: 10 mL
  Filled 2019-07-22: qty 10

## 2019-07-22 MED ORDER — HEPARIN SOD (PORK) LOCK FLUSH 100 UNIT/ML IV SOLN
250.0000 [IU] | Freq: Once | INTRAVENOUS | Status: AC
  Administered 2019-07-22: 250 [IU]
  Filled 2019-07-22: qty 5

## 2019-07-22 NOTE — Progress Notes (Unsigned)
Due to patient's insurance she is not sure where she is able to get compounded medication. She asked to put this order on hold until she can determine where the less copay will be charged.

## 2019-07-23 ENCOUNTER — Encounter: Payer: Self-pay | Admitting: Hematology and Oncology

## 2019-07-23 ENCOUNTER — Other Ambulatory Visit: Payer: Self-pay | Admitting: *Deleted

## 2019-07-23 ENCOUNTER — Telehealth: Payer: Self-pay | Admitting: Hematology and Oncology

## 2019-07-23 DIAGNOSIS — K1231 Oral mucositis (ulcerative) due to antineoplastic therapy: Secondary | ICD-10-CM | POA: Insufficient documentation

## 2019-07-23 DIAGNOSIS — R5383 Other fatigue: Secondary | ICD-10-CM | POA: Insufficient documentation

## 2019-07-23 MED ORDER — MAGIC MOUTHWASH W/LIDOCAINE: 5.0000 mL | Freq: Three times a day (TID) | ORAL | 0 refills | Status: DC | PRN

## 2019-07-23 NOTE — Progress Notes (Signed)
[facility] OFFICE PROGRESS NOTE  Patient Care Team: Shon Baton, MD as PCP - General (Internal Medicine) Awanda Mink Craige Cotta, RN as Oncology Nurse Navigator (Oncology)  ASSESSMENT & PLAN:  Endometrioid adenocarcinoma of uterus [facility]) So far, she tolerated treatment well except for fatigue and intermittent mouth sores and occasional nausea Her blood count is satisfactory I recommend prescription Magic mouthwash to try to treat the mouth sore as needed The patient have changes in insurance and we will try to order her prescriptions through preferred pharmacies I plan to see her again at the end of the month for further toxicity review I recommend minimum 3 months of treatment before repeat imaging study  Mucositis due to chemotherapy This is an expected side effects from treatment I recommend conservative management and Magic mouthwash as needed  Lymphedema Lymphedema is stable Observe for now  Other fatigue She complained of fatigue while on treatment Certainly, it could be due to side effects of chemotherapy I will check thyroid function in her next visit If she have hypothyroidism, that might have also contributed to her leg edema   Orders Placed This Encounter  Procedures  . TSH    Standing Status:   Future    Standing Expiration Date:   08/26/2020  . T4, free    Standing Status:   Future    Standing Expiration Date:   08/26/2020    INTERVAL HISTORY: Please see below for problem oriented charting. She returns for toxicity check after starting chemotherapy early last week So far, she tolerated treatment well except for intermittent mucositis but it does not seem to affect her oral intake She denies persistent diarrhea since last time I saw her She have occasional nausea but not significant She complained of some fatigue No changes to her lower extremity edema No recent infection, fever or chills She denies mood swings or depression while on letrozole Denies  hot flashes She has some concerns about switching her insurance recently and checking to make sure that all her prescriptions are sent to preferred pharmacy due to costs  SUMMARY OF ONCOLOGIC HISTORY: Oncology History Overview Note  ER 90%, PR 95% PD-L1 score 1% MSI Stable   Endometrioid adenocarcinoma of uterus ([place])  01/05/2014 Initial Diagnosis   The patient has a history of a 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  The patient's initial surgery was with Dr. Marti Sleigh in [place] on January 05, 2014.  It included an exploratory laparotomy with TAH, BSO, appendectomy, pelvic and para-aortic lymphadenectomy and wedge resection of a concerning appearing liver lesion.  Preoperatively she presented with a 26 cm right adnexal mass and an elevated Ca1 25 at 404 units/mL.  At the completion of surgery there was no gross residual disease.  Final pathology showed a primary grade 3 endometrial adenocarcinoma (endometrioid) involving both ovaries.  Lymph nodes and other biopsies were negative.  She had uncomplicated postoperative course.  She was treated with 6 cycles of carboplatin paclitaxel chemotherapy.  She was recommended adjuvant radiation therapy however declined this after counseling.  Following her initial therapy she developed severe left lower extremity lymphedema.   Follow-up CT scan in March 2016 showed no evidence of disease.  She was followed every 3 months for a total of 5 years with no recurrence appreciated during that time   03/28/2019 Imaging   Ct abdomen and pelvis 1. Enlarged right inguinal lymph node or soft tissue mass, measuring 3.8 x  3.5 cm (series 2, image 69). Additional enlarged right iliac lymph node measuring 1.9 x 1.1 cm (series 2, image 68). Findings are concerning for malignancy.   2. Status post hysterectomy per stated history of endometrial malignancy.   3. No other findings in the abdomen or pelvis  concerning for malignancy or metastatic disease.   4. There are multiple subcentimeter low-attenuation lesions of the liver, the largest in the left lobe the measuring 7 mm, too small to characterize (series 2, image 24). Attention on follow-up although these are most likely incidental benign small cysts and/or hemangiomata.   5.  Aortic atherosclerosis.   04/10/2019 Pathology Results   A. LYMPH NODE, RIGHT INGUINAL, NEEDLE CORE BIOPSY:  - Metastatic adenocarcinoma.  - See comment.   COMMENT:   The morphology is most consistent with metastatic endometrioid adenocarcinoma.  Immunohistochemistry can be performed if clinically indicated.  There is sufficient tissue for additional testing   04/10/2019 Procedure   Technically successful ultrasound guided biopsy of dominant right inguinal lymph node   04/22/2019 Cancer Staging   Staging form: Corpus Uteri - Carcinoma and Carcinosarcoma, AJCC 8th Edition - Pathologic stage from 04/22/2019: FIGO Stage IVB (rpT3a, pN0, pM1) - Signed by [name], MD on 04/22/2019   04/22/2019 PET scan   1. Two hypermetabolic RIGHT iliac lymph nodes and larger metastatic RIGHT inguinal lymph node. 2. No evidence of metastatic disease outside the pelvis. 3. No clear evidence of activity at the vaginal cuff.   05/15/2019 Procedure   Placement of a subcutaneous port device. Catheter tip in the lower SVC   05/16/2019 - 06/12/2019 Chemotherapy   The patient had cisplatin for chemotherapy treatment.      Genetic Testing   Patient has genetic testing done for MSI. Results revealed patient has the following: MSI Stable   06/23/2019 Imaging   1. Interval decreased size of right inguinal and external iliac lymph nodes. 2. No evidence of progressive metastatic disease. 3. Stable small low-density hepatic lesions, likely cysts. 4. Aortic Atherosclerosis (ICD10-I70.0).     07/14/2019 -  Chemotherapy   The patient had everolimus and letrozole for chemotherapy  treatment.       REVIEW OF SYSTEMS:   Constitutional: Denies fevers, chills or abnormal weight loss Eyes: Denies blurriness of vision Respiratory: Denies cough, dyspnea or wheezes Cardiovascular: Denies palpitation, chest discomfort Skin: Denies abnormal skin rashes Lymphatics: Denies new lymphadenopathy or easy bruising Neurological:Denies numbness, tingling or new weaknesses Behavioral/Psych: Mood is stable, no new changes  All other systems were reviewed with the patient and are negative.  I have reviewed the past medical history, past surgical history, social history and family history with the patient and they are unchanged from previous note.  ALLERGIES:  is allergic to oxycodone; garlic; other; and penicillins.  MEDICATIONS:  Current Outpatient Medications  Medication Sig Dispense Refill  . amLODipine (NORVASC) 5 MG tablet Take 5 mg by mouth daily.     Marland Kitchen everolimus (AFINITOR) 10 MG tablet Take 1 tablet (10 mg total) by mouth daily. 30 tablet 11  . ibuprofen (ADVIL) 200 MG tablet Take 200 mg by mouth every 6 (six) hours as needed.    Marland Kitchen letrozole (FEMARA) 2.5 MG tablet Take 1 tablet (2.5 mg total) by mouth daily. 90 tablet 3  . lidocaine-prilocaine (EMLA) cream Apply to affected area once 30 g 3  . ondansetron (ZOFRAN) 8 MG tablet Take 1 tablet (8 mg total) by mouth every 8 (eight) hours as needed. 30 tablet 1  .  prochlorperazine (COMPAZINE) 10 MG tablet Take 1 tablet (10 mg total) by mouth every 6 (six) hours as needed (Nausea or vomiting). 30 tablet 1  . traMADol (ULTRAM) 50 MG tablet TK 1 T PO  BID PRF PAINS.     No current facility-administered medications for this visit.    PHYSICAL EXAMINATION: ECOG PERFORMANCE STATUS: 1 - Symptomatic but completely ambulatory  Vitals:   07/22/19 1305  BP: 138/81  Pulse: 77  Resp: 18  Temp: 97.7 F (36.5 C)  SpO2: 100%   Filed Weights   07/22/19 1305  Weight: 173 lb 12.8 oz (78.8 kg)    GENERAL:alert, no distress and  comfortable OROPHARYNX: minimal erythema for grade 1 mucositis.  No ulceration or oral bleeding NEURO: alert & oriented x 3 with fluent speech, no focal motor/sensory deficits  LABORATORY DATA:  I have reviewed the data as listed    Component Value Date/Time   NA 139 07/22/2019 1255   K 3.7 07/22/2019 1255   CL 103 07/22/2019 1255   CO2 25 07/22/2019 1255   GLUCOSE 91 07/22/2019 1255   BUN 6 (L) 07/22/2019 1255   CREATININE 0.75 07/22/2019 1255   CALCIUM 8.8 (L) 07/22/2019 1255   PROT 6.8 07/22/2019 1255   ALBUMIN 3.6 07/22/2019 1255   AST 14 (L) 07/22/2019 1255   ALT 10 07/22/2019 1255   ALKPHOS 112 07/22/2019 1255   BILITOT 0.3 07/22/2019 1255   GFRNONAA >60 07/22/2019 1255   GFRAA >60 07/22/2019 1255    No results found for: SPEP, UPEP  Lab Results  Component Value Date   WBC 4.2 07/22/2019   NEUTROABS 2.7 07/22/2019   HGB 12.9 07/22/2019   HCT 38.3 07/22/2019   MCV 93.0 07/22/2019   PLT 230 07/22/2019      Chemistry      Component Value Date/Time   NA 139 07/22/2019 1255   K 3.7 07/22/2019 1255   CL 103 07/22/2019 1255   CO2 25 07/22/2019 1255   BUN 6 (L) 07/22/2019 1255   CREATININE 0.75 07/22/2019 1255      Component Value Date/Time   CALCIUM 8.8 (L) 07/22/2019 1255   ALKPHOS 112 07/22/2019 1255   AST 14 (L) 07/22/2019 1255   ALT 10 07/22/2019 1255   BILITOT 0.3 07/22/2019 1255       RADIOGRAPHIC STUDIES: I have personally reviewed the radiological images as listed and agreed with the findings in the report. CT Abdomen Pelvis W Contrast  Result Date: 06/23/2019 CLINICAL DATA:  Endometrial cancer diagnosed in 2015 with recurrence earlier this year. Chemotherapy and radiation therapy completed. Patient reports right lower quadrant abdominal tenderness with nausea and diarrhea following radiation therapy. EXAM: CT ABDOMEN AND PELVIS WITH CONTRAST TECHNIQUE: Multidetector CT imaging of the abdomen and pelvis was performed using the standard protocol  following bolus administration of intravenous contrast. CONTRAST:  117m OMNIPAQUE IOHEXOL 300 MG/ML  SOLN COMPARISON:  CT 03/28/2019. PET-CT 04/22/2019. FINDINGS: Lower chest: Clear lung bases. No significant pleural or pericardial effusion. Stable small hiatal hernia. Hepatobiliary: Several subcentimeter low-density hepatic lesions are stable, likely cysts. No new or enlarging hepatic lesions. No evidence of gallstones, gallbladder wall thickening or biliary dilatation. Pancreas: Unremarkable. No pancreatic ductal dilatation or surrounding inflammatory changes. Spleen: Normal in size without focal abnormality. Adrenals/Urinary Tract: Both adrenal glands appear normal. There are small left renal parapelvic cysts. No evidence of urinary tract calculus or hydronephrosis. The bladder is incompletely distended and partially obscured by artifact from the right total hip arthroplasty,  although demonstrates no abnormality. Stomach/Bowel: No evidence of bowel wall thickening, distention or surrounding inflammatory change. Moderate sigmoid diverticulosis. Vascular/Lymphatic: There are no enlarged abdominal lymph nodes. The previously demonstrated right inguinal mass/node is smaller, measuring 19 x 16 mm on image 69/2 (previously 30 x 35 mm). A small right external iliac node is also slightly smaller, with a short axis dimension of 8 mm on image 69/2. These were both hypermetabolic on previous PET-CT. Mild aortic and branch vessel atherosclerosis. No acute vascular findings. Reproductive: Hysterectomy. No evidence of adnexal mass. The inferior right pelvis is partly obscured by artifact from the right total hip arthroplasty. Other: Small supraumbilical hernia containing only fat. No ascites or peritoneal nodularity. Musculoskeletal: No acute or significant osseous findings. Degenerative and postsurgical changes in the lower lumbar spine. Previous right total hip arthroplasty. IMPRESSION: 1. Interval decreased size of right  inguinal and external iliac lymph nodes. 2. No evidence of progressive metastatic disease. 3. Stable small low-density hepatic lesions, likely cysts. 4. Aortic Atherosclerosis (ICD10-I70.0). Electronically Signed   By: Richardean Sale M.D.   On: 06/23/2019 15:35    All questions were answered. The patient knows to call the clinic with any problems, questions or concerns. No barriers to learning was detected.  I spent 25 minutes counseling the patient face to face. The total time spent in the appointment was 30 minutes including additional treatment planning, review of test results and care coordination  [name], MD 07/23/2019 8:31 AM

## 2019-07-23 NOTE — Telephone Encounter (Signed)
Scheduled per 1/6 sch msg. Called and spoke with pt, confirmed 1/28 appt

## 2019-07-23 NOTE — Assessment & Plan Note (Signed)
So far, she tolerated treatment well except for fatigue and intermittent mouth sores and occasional nausea Her blood count is satisfactory I recommend prescription Magic mouthwash to try to treat the mouth sore as needed The patient have changes in insurance and we will try to order her prescriptions through preferred pharmacies I plan to see her again at the end of the month for further toxicity review I recommend minimum 3 months of treatment before repeat imaging study

## 2019-07-23 NOTE — Assessment & Plan Note (Signed)
She complained of fatigue while on treatment Certainly, it could be due to side effects of chemotherapy I will check thyroid function in her next visit If she have hypothyroidism, that might have also contributed to her leg edema

## 2019-07-23 NOTE — Assessment & Plan Note (Signed)
This is an expected side effects from treatment I recommend conservative management and Magic mouthwash as needed

## 2019-07-23 NOTE — Assessment & Plan Note (Signed)
Lymphedema is stable Observe for now 

## 2019-07-30 ENCOUNTER — Telehealth: Payer: Self-pay | Admitting: Oncology

## 2019-07-30 NOTE — Telephone Encounter (Signed)
Dana [name] left a message and wanted to clarify the directions for Afinitor.  She was told to take it for 21 days and then take 7 days off.  She said she wanted to double check because there are 28 pills in a box.

## 2019-07-31 NOTE — Telephone Encounter (Signed)
Dana [name] of message from Dr. Alvy Bimler.  She said she is still having mucositis so she will take the Afinitor for 21 days with the 7 day break.  She also said that someone had called her at the first of the year about a grant and she hasn't heard anything since.  Sent an inbasket to pharmacy to check about the grant.

## 2019-07-31 NOTE — Telephone Encounter (Signed)
The prescription is written to be taken daily for someone who can tolerate it For people with mucositis, they can take 7 days break Ask if her mucositis is bothering her or not If yes, take 7 days break If not, take daily without break

## 2019-08-05 ENCOUNTER — Telehealth: Payer: Self-pay

## 2019-08-05 ENCOUNTER — Telehealth: Payer: Self-pay | Admitting: Oncology

## 2019-08-05 NOTE — Telephone Encounter (Signed)
Dana [name] said she is having a terrible time with her mouth.  She has been off Afinitor for 4 days and said her tongue is covered with sores with 2-3 deep ones.  She also has sores in her mouth.  She is using magic mouthwash frequently and ice cubes which numb the area.  She is wondering if there is anything else she can do.

## 2019-08-05 NOTE — Telephone Encounter (Signed)
Oral Oncology Patient Advocate Encounter  Completed online application for [employer] [place] ([employer]) in an effort to reduce the patient's out of pocket expense for Afinitor to $0.    Application completed and faxed to [phone].   [employer] phone number for follow up is [phone].   This encounter will be updated until final determination.   [place] Patient [place] Phone [phone] Fax [phone] 08/05/2019 3:26 PM

## 2019-08-06 NOTE — Telephone Encounter (Signed)
Please tell her to stop Affinitor immediately Continue MMW and baking soda/warm water/salt gargle as needed Do not restart until I see her next week

## 2019-08-06 NOTE — Telephone Encounter (Signed)
Dana [name] of message from Dr. Alvy Bimler.  She verbalized understanding and agreement.

## 2019-08-08 ENCOUNTER — Telehealth: Payer: Self-pay

## 2019-08-08 NOTE — Telephone Encounter (Signed)
Sent an Fish farm manager to [employer] for [place] in effort to make patient out of pocket expense $0.  Application is in review process.  [employer] [place] [facility] [place] Phone [phone] Fax [phone] 08/08/2019 2:52 PM

## 2019-08-12 NOTE — Telephone Encounter (Signed)
Patient is approved for assistance for Afinitor through [employer] PAF 08/12/19-07/16/20.  [employer] phone [phone]  [place] [facility] [place] Phone [phone] Fax [phone] 08/12/2019 3:47 PM

## 2019-08-14 ENCOUNTER — Inpatient Hospital Stay: Payer: Medicare HMO

## 2019-08-14 ENCOUNTER — Inpatient Hospital Stay (HOSPITAL_BASED_OUTPATIENT_CLINIC_OR_DEPARTMENT_OTHER): Payer: Medicare HMO | Admitting: Hematology and Oncology

## 2019-08-14 ENCOUNTER — Other Ambulatory Visit: Payer: Self-pay

## 2019-08-14 ENCOUNTER — Telehealth: Payer: Self-pay | Admitting: Oncology

## 2019-08-14 DIAGNOSIS — K769 Liver disease, unspecified: Secondary | ICD-10-CM | POA: Diagnosis not present

## 2019-08-14 DIAGNOSIS — Z9079 Acquired absence of other genital organ(s): Secondary | ICD-10-CM | POA: Diagnosis not present

## 2019-08-14 DIAGNOSIS — I890 Lymphedema, not elsewhere classified: Secondary | ICD-10-CM | POA: Diagnosis not present

## 2019-08-14 DIAGNOSIS — L820 Inflamed seborrheic keratosis: Secondary | ICD-10-CM

## 2019-08-14 DIAGNOSIS — C55 Malignant neoplasm of uterus, part unspecified: Secondary | ICD-10-CM | POA: Diagnosis not present

## 2019-08-14 DIAGNOSIS — Z90710 Acquired absence of both cervix and uterus: Secondary | ICD-10-CM | POA: Diagnosis not present

## 2019-08-14 DIAGNOSIS — K1231 Oral mucositis (ulcerative) due to antineoplastic therapy: Secondary | ICD-10-CM | POA: Diagnosis not present

## 2019-08-14 DIAGNOSIS — Z90722 Acquired absence of ovaries, bilateral: Secondary | ICD-10-CM | POA: Diagnosis not present

## 2019-08-14 DIAGNOSIS — T451X5A Adverse effect of antineoplastic and immunosuppressive drugs, initial encounter: Secondary | ICD-10-CM | POA: Diagnosis not present

## 2019-08-14 DIAGNOSIS — C541 Malignant neoplasm of endometrium: Secondary | ICD-10-CM | POA: Diagnosis not present

## 2019-08-14 DIAGNOSIS — C774 Secondary and unspecified malignant neoplasm of inguinal and lower limb lymph nodes: Secondary | ICD-10-CM | POA: Diagnosis not present

## 2019-08-14 DIAGNOSIS — R5383 Other fatigue: Secondary | ICD-10-CM | POA: Diagnosis not present

## 2019-08-14 LAB — COMPREHENSIVE METABOLIC PANEL
ALT: 11 U/L (ref 0–44)
AST: 12 U/L — ABNORMAL LOW (ref 15–41)
Albumin: 3.5 g/dL (ref 3.5–5.0)
Alkaline Phosphatase: 95 U/L (ref 38–126)
Anion gap: 9 (ref 5–15)
BUN: 7 mg/dL — ABNORMAL LOW (ref 8–23)
CO2: 27 mmol/L (ref 22–32)
Calcium: 8.8 mg/dL — ABNORMAL LOW (ref 8.9–10.3)
Chloride: 105 mmol/L (ref 98–111)
Creatinine, Ser: 0.86 mg/dL (ref 0.44–1.00)
GFR calc Af Amer: >60 mL/min (ref >60)
GFR calc non Af Amer: >60 mL/min (ref >60)
Glucose, Bld: 92 mg/dL (ref 70–99)
Potassium: 4 mmol/L (ref 3.5–5.1)
Sodium: 141 mmol/L (ref 135–145)
Total Bilirubin: 0.4 mg/dL (ref 0.3–1.2)
Total Protein: 6.7 g/dL (ref 6.5–8.1)

## 2019-08-14 LAB — CBC WITH DIFFERENTIAL/PLATELET
Abs Immature Granulocytes: 0.01 10*3/uL (ref 0.00–0.07)
Basophils Absolute: 0 10*3/uL (ref 0.0–0.1)
Basophils Relative: 1 %
Eosinophils Absolute: 0.1 10*3/uL (ref 0.0–0.5)
Eosinophils Relative: 3 %
HCT: 38.5 % (ref 36.0–46.0)
Hemoglobin: 12.6 g/dL (ref 12.0–15.0)
Immature Granulocytes: 0 %
Lymphocytes Relative: 16 %
Lymphs Abs: 0.8 10*3/uL (ref 0.7–4.0)
MCH: 30.4 pg (ref 26.0–34.0)
MCHC: 32.7 g/dL (ref 30.0–36.0)
MCV: 92.8 fL (ref 80.0–100.0)
Monocytes Absolute: 0.6 10*3/uL (ref 0.1–1.0)
Monocytes Relative: 11 %
Neutro Abs: 3.4 10*3/uL (ref 1.7–7.7)
Neutrophils Relative %: 69 %
Platelets: 310 10*3/uL (ref 150–400)
RBC: 4.15 MIL/uL (ref 3.87–5.11)
RDW: 13.8 % (ref 11.5–15.5)
WBC: 4.9 10*3/uL (ref 4.0–10.5)
nRBC: 0 % (ref 0.0–0.2)

## 2019-08-14 LAB — T4, FREE: Free T4: 0.73 ng/dL (ref 0.61–1.12)

## 2019-08-14 LAB — TSH: TSH: 2.606 u[IU]/mL (ref 0.308–3.960)

## 2019-08-14 NOTE — Telephone Encounter (Signed)
Ahmia called and said that Bebe Liter needs her return to work date updated.  She said to call Glean Salvo at [place] at [phone].  She also said her Afinitor prescription is going to be changed after she takes her last 9 pills (every other day) and is wondering if it will effect her grant.  Advised her I will notified Benjamine Mola in pharmacy once a new prescription is entered.  Called Jody and requested a fax with any information that is needed for Amanda's disability.  Paperwork was received and sent to FMLA/disability nurse to be completed.

## 2019-08-15 ENCOUNTER — Other Ambulatory Visit: Payer: Self-pay | Admitting: Pharmacist

## 2019-08-15 ENCOUNTER — Telehealth: Payer: Self-pay | Admitting: Hematology and Oncology

## 2019-08-15 ENCOUNTER — Encounter: Payer: Self-pay | Admitting: Hematology and Oncology

## 2019-08-15 DIAGNOSIS — L820 Inflamed seborrheic keratosis: Secondary | ICD-10-CM | POA: Insufficient documentation

## 2019-08-15 DIAGNOSIS — C55 Malignant neoplasm of uterus, part unspecified: Secondary | ICD-10-CM

## 2019-08-15 MED ORDER — EVEROLIMUS 5 MG PO TABS: 5.0000 mg | ORAL_TABLET | Freq: Every day | ORAL | 11 refills | Status: DC

## 2019-08-15 NOTE — Assessment & Plan Note (Signed)
She continues to have significant mucositis with each cycle of therapy despite intermittent interruptions Her mucositis improves when she take a treatment break I recommend she take a few more days of break and resume her treatment next week starting February 1 She will take her remaining 10 mg tablet every other day until her supply runs out, which I anticipate will happen in about 3 weeks I will get new dose of 5 mg Afinitor sent to her specialty pharmacy She will continue same dose letrozole I plan to continue similar treatment for at least 3 months of therapy and do not plan to repeat imaging study until end of March or early April

## 2019-08-15 NOTE — Progress Notes (Signed)
[name] OFFICE PROGRESS NOTE  Patient Care Team: Dana Baton, MD as PCP - General (Internal Medicine) Dana Mink Craige Cotta, RN as Oncology Nurse Navigator (Oncology)  ASSESSMENT & PLAN:  Endometrioid adenocarcinoma of uterus [facility]) She continues to have significant mucositis with each cycle of therapy despite intermittent interruptions Her mucositis improves when she take a treatment break I recommend she take a few more days of break and resume her treatment next week starting February 1 She will take her remaining 10 mg tablet every other day until her supply runs out, which I anticipate will happen in about 3 weeks I will get new dose of 5 mg Afinitor sent to her specialty pharmacy She will continue same dose letrozole I plan to continue similar treatment for at least 3 months of therapy and do not plan to repeat imaging study until end of March or early April  Mucositis due to chemotherapy Her mucositis is quite significant intermittently and resolve when she takes a break She has Magic mouthwash to take as needed and it is resolving nicely As above, she will resume her treatment next week with reduced dose  Lymphedema Lymphedema is stable Observe for now  Seborrheic keratoses, inflamed The inflamed seborrheic keratosis is likely due to flare from side effects of treatment Observe only for now   No orders of the defined types were placed in this encounter.   All questions were answered. The patient knows to call the clinic with any problems, questions or concerns. The total time spent in the appointment was 30 minutes encounter with patients including review of chart and various tests results, discussions about plan of care and coordination of care plan   Dana Lark, MD 08/15/2019 8:08 AM  INTERVAL HISTORY: Please see below for problem oriented charting. 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  The patient's initial surgery was with Dr. Marti Sleigh in [place] on January 05, 2014.  It included an exploratory laparotomy with TAH, BSO, appendectomy, pelvic and para-aortic lymphadenectomy and wedge resection of a concerning appearing liver lesion.  Preoperatively she presented with a 26 cm right adnexal mass and an elevated Ca1 25 at 404 units/mL.  At the completion of surgery there was no gross residual disease.  Final pathology showed a primary grade 3 endometrial adenocarcinoma (endometrioid) involving both ovaries.  Lymph nodes and other biopsies were negative.  She had uncomplicated postoperative course.  She was treated with 6 cycles of carboplatin paclitaxel chemotherapy.  She was recommended adjuvant radiation therapy however declined this after counseling.  Following her initial therapy she developed severe left lower extremity lymphedema.   Follow-up CT scan in March 2016 showed no evidence of disease.  She was followed every 3 months for a total of 5 years with no recurrence appreciated during that time   03/28/2019 Imaging   Ct abdomen and  pelvis 1. Enlarged right inguinal lymph node or soft tissue mass, measuring 3.8 x 3.5 cm (series 2, image 69). Additional enlarged right iliac lymph node measuring 1.9 x 1.1 cm (series 2, image 68). Findings are concerning for malignancy.   2. Status post  hysterectomy per stated history of endometrial malignancy.   3. No other findings in the abdomen or pelvis concerning for malignancy or metastatic disease.   4. There are multiple subcentimeter low-attenuation lesions of the liver, the largest in the left lobe the measuring 7 mm, too small to characterize (series 2, image 24). Attention on follow-up although these are most likely incidental benign small cysts and/or hemangiomata.   5.  Aortic atherosclerosis.   04/10/2019 Pathology Results   A. LYMPH NODE, RIGHT INGUINAL, NEEDLE CORE BIOPSY:  - Metastatic adenocarcinoma.  - See comment.   COMMENT:   The morphology is most consistent with metastatic endometrioid adenocarcinoma.  Immunohistochemistry can be performed if clinically indicated.  There is sufficient tissue for additional testing   04/10/2019 Procedure   Technically successful ultrasound guided biopsy of dominant right inguinal lymph node   04/22/2019 Cancer Staging   Staging form: Corpus Uteri - Carcinoma and Carcinosarcoma, AJCC 8th Edition - Pathologic stage from 04/22/2019: FIGO Stage IVB (rpT3a, pN0, pM1) - Signed by Dana Lark, MD on 04/22/2019   04/22/2019 PET scan   1. Two hypermetabolic RIGHT iliac lymph nodes and larger metastatic RIGHT inguinal lymph node. 2. No evidence of metastatic disease outside the pelvis. 3. No clear evidence of activity at the vaginal cuff.   05/15/2019 Procedure   Placement of a subcutaneous port device. Catheter tip in the lower SVC   05/16/2019 - 06/12/2019 Chemotherapy   The patient had cisplatin for chemotherapy treatment.      Genetic Testing   Patient has genetic testing done for MSI. Results revealed patient has the following: MSI Stable   06/23/2019 Imaging   1. Interval decreased size of right inguinal and external iliac lymph nodes. 2. No evidence of progressive metastatic disease. 3. Stable small low-density hepatic lesions, likely cysts. 4. Aortic Atherosclerosis  (ICD10-I70.0).     07/14/2019 -  Chemotherapy   The patient had everolimus and letrozole for chemotherapy treatment.       REVIEW OF SYSTEMS:   Constitutional: Denies fevers, chills or abnormal weight loss Eyes: Denies blurriness of vision Respiratory: Denies cough, dyspnea or wheezes Cardiovascular: Denies palpitation, chest discomfort  Gastrointestinal:  Denies nausea, heartburn or change in bowel habits Lymphatics: Denies new lymphadenopathy or easy bruising Neurological:Denies numbness, tingling or new weaknesses Behavioral/Psych: Mood is stable, no new changes  All other systems were reviewed with the patient and are negative.  I have reviewed the past medical history, past surgical history, social history and family history with the patient and they are unchanged from previous note.  ALLERGIES:  is allergic to oxycodone; garlic; other; and penicillins.  MEDICATIONS:  Current Outpatient Medications  Medication Sig Dispense Refill  . amLODipine (NORVASC) 5 MG tablet Take 5 mg by mouth daily.     Marland Kitchen everolimus (AFINITOR) 5 MG tablet Take 1 tablet (5 mg total) by mouth daily. 30 tablet 11  . ibuprofen (ADVIL) 200 MG tablet Take 200 mg by mouth every 6 (six) hours as needed.    Marland Kitchen letrozole (FEMARA) 2.5 MG tablet Take 1 tablet (2.5 mg total) by mouth daily. 90 tablet 3  . lidocaine-prilocaine (EMLA) cream Apply to affected area once 30 g 3  . magic mouthwash w/lidocaine SOLN  Take 5 mLs by mouth 3 (three) times daily as needed for mouth pain. 145 mL 0  . ondansetron (ZOFRAN) 8 MG tablet Take 1 tablet (8 mg total) by mouth every 8 (eight) hours as needed. 30 tablet 1  . prochlorperazine (COMPAZINE) 10 MG tablet Take 1 tablet (10 mg total) by mouth every 6 (six) hours as needed (Nausea or vomiting). 30 tablet 1  . traMADol (ULTRAM) 50 MG tablet TK 1 T PO  BID PRF PAINS.     No current facility-administered medications for this visit.    PHYSICAL EXAMINATION: ECOG PERFORMANCE  STATUS: 1 - Symptomatic but completely ambulatory  Vitals:   08/14/19 1004  BP: 131/71  Pulse: 84  Resp: 10  Temp: (!) 97.2 F (36.2 C)  SpO2: 100%   Filed Weights   08/14/19 1004  Weight: 175 lb 6.4 oz (79.6 kg)    GENERAL:alert, no distress and comfortable SKIN: The skin lesion is consistent with seborrheic keratosis EYES: normal, Conjunctiva are pink and non-injected, sclera clear OROPHARYNX:no exudate, no erythema and lips, buccal mucosa, and tongue normal.  No significant signs of ulceration or mucositis NECK: supple, thyroid normal size, non-tender, without nodularity LYMPH:  no palpable lymphadenopathy in the cervical, axillary or inguinal LUNGS: clear to auscultation and percussion with normal breathing effort HEART: regular rate & rhythm and no murmurs with mild bilateral lower extremity edema ABDOMEN:abdomen soft, non-tender and normal bowel sounds Musculoskeletal:no cyanosis of digits and no clubbing  NEURO: alert & oriented x 3 with fluent speech, no focal motor/sensory deficits  LABORATORY DATA:  I have reviewed the data as listed    Component Value Date/Time   NA 141 08/14/2019 0949   K 4.0 08/14/2019 0949   CL 105 08/14/2019 0949   CO2 27 08/14/2019 0949   GLUCOSE 92 08/14/2019 0949   BUN 7 (L) 08/14/2019 0949   CREATININE 0.86 08/14/2019 0949   CALCIUM 8.8 (L) 08/14/2019 0949   PROT 6.7 08/14/2019 0949   ALBUMIN 3.5 08/14/2019 0949   AST 12 (L) 08/14/2019 0949   ALT 11 08/14/2019 0949   ALKPHOS 95 08/14/2019 0949   BILITOT 0.4 08/14/2019 0949   GFRNONAA >60 08/14/2019 0949   GFRAA >60 08/14/2019 0949    No results found for: SPEP, UPEP  Lab Results  Component Value Date   WBC 4.9 08/14/2019   NEUTROABS 3.4 08/14/2019   HGB 12.6 08/14/2019   HCT 38.5 08/14/2019   MCV 92.8 08/14/2019   PLT 310 08/14/2019      Chemistry      Component Value Date/Time   NA 141 08/14/2019 0949   K 4.0 08/14/2019 0949   CL 105 08/14/2019 0949   CO2 27  08/14/2019 0949   BUN 7 (L) 08/14/2019 0949   CREATININE 0.86 08/14/2019 0949      Component Value Date/Time   CALCIUM 8.8 (L) 08/14/2019 0949   ALKPHOS 95 08/14/2019 0949   AST 12 (L) 08/14/2019 0949   ALT 11 08/14/2019 0949   BILITOT 0.4 08/14/2019 0949

## 2019-08-15 NOTE — Telephone Encounter (Signed)
I talk with patient regarding schedule  

## 2019-08-15 NOTE — Assessment & Plan Note (Signed)
Her mucositis is quite significant intermittently and resolve when she takes a break She has Magic mouthwash to take as needed and it is resolving nicely As above, she will resume her treatment next week with reduced dose

## 2019-08-15 NOTE — Assessment & Plan Note (Signed)
The inflamed seborrheic keratosis is likely due to flare from side effects of treatment Observe only for now

## 2019-08-15 NOTE — Assessment & Plan Note (Signed)
Lymphedema is stable Observe for now 

## 2019-08-22 ENCOUNTER — Telehealth: Payer: Self-pay | Admitting: *Deleted

## 2019-08-22 NOTE — Telephone Encounter (Addendum)
[name] "Return to Work Assessment form" successfully faxed to [phone].  Original copy mailed to patient.

## 2019-08-26 DIAGNOSIS — R69 Illness, unspecified: Secondary | ICD-10-CM | POA: Diagnosis not present

## 2019-09-09 ENCOUNTER — Inpatient Hospital Stay: Payer: Medicare HMO

## 2019-09-09 ENCOUNTER — Inpatient Hospital Stay: Payer: Medicare HMO | Attending: Gynecologic Oncology

## 2019-09-09 ENCOUNTER — Telehealth: Payer: Self-pay | Admitting: Oncology

## 2019-09-09 ENCOUNTER — Inpatient Hospital Stay (HOSPITAL_BASED_OUTPATIENT_CLINIC_OR_DEPARTMENT_OTHER): Payer: Medicare HMO | Admitting: Hematology and Oncology

## 2019-09-09 ENCOUNTER — Encounter: Payer: Self-pay | Admitting: Hematology and Oncology

## 2019-09-09 ENCOUNTER — Other Ambulatory Visit: Payer: Self-pay

## 2019-09-09 DIAGNOSIS — I890 Lymphedema, not elsewhere classified: Secondary | ICD-10-CM | POA: Insufficient documentation

## 2019-09-09 DIAGNOSIS — K1231 Oral mucositis (ulcerative) due to antineoplastic therapy: Secondary | ICD-10-CM

## 2019-09-09 DIAGNOSIS — C541 Malignant neoplasm of endometrium: Secondary | ICD-10-CM | POA: Diagnosis present

## 2019-09-09 DIAGNOSIS — Z79811 Long term (current) use of aromatase inhibitors: Secondary | ICD-10-CM | POA: Insufficient documentation

## 2019-09-09 DIAGNOSIS — R5383 Other fatigue: Secondary | ICD-10-CM | POA: Diagnosis not present

## 2019-09-09 DIAGNOSIS — T451X5A Adverse effect of antineoplastic and immunosuppressive drugs, initial encounter: Secondary | ICD-10-CM | POA: Diagnosis not present

## 2019-09-09 DIAGNOSIS — C55 Malignant neoplasm of uterus, part unspecified: Secondary | ICD-10-CM | POA: Diagnosis not present

## 2019-09-09 DIAGNOSIS — C774 Secondary and unspecified malignant neoplasm of inguinal and lower limb lymph nodes: Secondary | ICD-10-CM | POA: Insufficient documentation

## 2019-09-09 DIAGNOSIS — Z79899 Other long term (current) drug therapy: Secondary | ICD-10-CM | POA: Diagnosis not present

## 2019-09-09 DIAGNOSIS — I700 Atherosclerosis of aorta: Secondary | ICD-10-CM | POA: Insufficient documentation

## 2019-09-09 DIAGNOSIS — G620 Drug-induced polyneuropathy: Secondary | ICD-10-CM | POA: Diagnosis not present

## 2019-09-09 LAB — CBC WITH DIFFERENTIAL/PLATELET
Abs Immature Granulocytes: 0.01 10*3/uL (ref 0.00–0.07)
Basophils Absolute: 0 10*3/uL (ref 0.0–0.1)
Basophils Relative: 1 %
Eosinophils Absolute: 0.1 10*3/uL (ref 0.0–0.5)
Eosinophils Relative: 3 %
HCT: 37.5 % (ref 36.0–46.0)
Hemoglobin: 12.2 g/dL (ref 12.0–15.0)
Immature Granulocytes: 0 %
Lymphocytes Relative: 18 %
Lymphs Abs: 0.7 10*3/uL (ref 0.7–4.0)
MCH: 30.2 pg (ref 26.0–34.0)
MCHC: 32.5 g/dL (ref 30.0–36.0)
MCV: 92.8 fL (ref 80.0–100.0)
Monocytes Absolute: 0.5 10*3/uL (ref 0.1–1.0)
Monocytes Relative: 11 %
Neutro Abs: 2.8 10*3/uL (ref 1.7–7.7)
Neutrophils Relative %: 67 %
Platelets: 282 10*3/uL (ref 150–400)
RBC: 4.04 MIL/uL (ref 3.87–5.11)
RDW: 13.2 % (ref 11.5–15.5)
WBC: 4.1 10*3/uL (ref 4.0–10.5)
nRBC: 0 % (ref 0.0–0.2)

## 2019-09-09 LAB — COMPREHENSIVE METABOLIC PANEL
ALT: 12 U/L (ref 0–44)
AST: 16 U/L (ref 15–41)
Albumin: 3.3 g/dL — ABNORMAL LOW (ref 3.5–5.0)
Alkaline Phosphatase: 99 U/L (ref 38–126)
Anion gap: 9 (ref 5–15)
BUN: 8 mg/dL (ref 8–23)
CO2: 24 mmol/L (ref 22–32)
Calcium: 8.9 mg/dL (ref 8.9–10.3)
Chloride: 106 mmol/L (ref 98–111)
Creatinine, Ser: 0.77 mg/dL (ref 0.44–1.00)
GFR calc Af Amer: >60 mL/min (ref >60)
GFR calc non Af Amer: >60 mL/min (ref >60)
Glucose, Bld: 88 mg/dL (ref 70–99)
Potassium: 3.8 mmol/L (ref 3.5–5.1)
Sodium: 139 mmol/L (ref 135–145)
Total Bilirubin: 0.3 mg/dL (ref 0.3–1.2)
Total Protein: 6.6 g/dL (ref 6.5–8.1)

## 2019-09-09 MED ORDER — SODIUM CHLORIDE 0.9% FLUSH
10.0000 mL | Freq: Once | INTRAVENOUS | Status: AC
  Administered 2019-09-09: 10 mL
  Filled 2019-09-09: qty 10

## 2019-09-09 MED ORDER — HEPARIN SOD (PORK) LOCK FLUSH 100 UNIT/ML IV SOLN
250.0000 [IU] | Freq: Once | INTRAVENOUS | Status: AC
  Administered 2019-09-09: 250 [IU]
  Filled 2019-09-09: qty 5

## 2019-09-09 NOTE — Assessment & Plan Note (Signed)
With reduced dose Afinitor, she tolerated treatment better She had very minimal mucositis that does not bother her We will proceed with reduced dose Afinitor as prescribed I plan to repeat CT imaging early April for objective assessment of response to therapy Currently, she have no signs or symptoms to suggest disease progression

## 2019-09-09 NOTE — Assessment & Plan Note (Signed)
She denies significant mucositis with recent dose changes We will continue to observe  We will call in Magic mouthwash as needed

## 2019-09-09 NOTE — Assessment & Plan Note (Signed)
Lymphedema is stable Observe for now 

## 2019-09-09 NOTE — Assessment & Plan Note (Signed)
This is likely residual neuropathy from prior treatment It is only grade 1 Observe only for now She can take pain medicine as needed

## 2019-09-09 NOTE — Assessment & Plan Note (Addendum)
She complained of fatigue while on treatment Certainly, it could be due to side effects of chemotherapy Recent thyroid function test in January was within normal limits 

## 2019-09-09 NOTE — Telephone Encounter (Addendum)
Tapanga called and said she needs Dr. Calton Dach office note faxed to [facility].  Advised her that I will call Bebe Liter and fax the note to them.  Sonal called back and said she just received paperwork from [place] that she will email to me.  Email received and given to Ros, RN to complete.

## 2019-09-09 NOTE — Progress Notes (Signed)
[place] OFFICE PROGRESS NOTE  Patient Care Team: Shon Baton, MD as PCP - General (Internal Medicine) Awanda Mink Craige Cotta, RN as Oncology Nurse Navigator (Oncology)  ASSESSMENT & PLAN:  Endometrioid adenocarcinoma of uterus [facility]) With reduced dose Afinitor, she tolerated treatment better She had very minimal mucositis that does not bother her We will proceed with reduced dose Afinitor as prescribed I plan to repeat CT imaging early April for objective assessment of response to therapy Currently, she have no signs or symptoms to suggest disease progression  Mucositis due to chemotherapy She denies significant mucositis with recent dose changes We will continue to observe  We will call in Magic mouthwash as needed  Peripheral neuropathy due to chemotherapy [facility]) This is likely residual neuropathy from prior treatment It is only grade 1 Observe only for now She can take pain medicine as needed  Lymphedema Lymphedema is stable Observe for now  Other fatigue She complained of fatigue while on treatment Certainly, it could be due to side effects of chemotherapy Recent thyroid function test in January was within normal limits   Orders Placed This Encounter  Procedures  . CT ABDOMEN PELVIS W CONTRAST    PER PROVIDER WATER BASE CONTRAST ONLY-s/w liz at office//[facility] 09/09/19 Wt. 175 / no diabetic / no diabetic monitor / no hbp / no renal ca or sx / no dialysis / no kidney problems both kidneys /no transplant /no needs / not allergic to iv contrast  No Covid Test/ No Public Trans travel/ No Fever, Cough, Shortness of breath/ No Close Contact w/pos Covid pt/ No fever /PT AWARE OF MASK/ COME ALONE Ins. [employer] w pt     Standing Status:   Future    Standing Expiration Date:   09/08/2020    Order Specific Question:   If indicated for the ordered procedure, I authorize the administration of contrast media per Radiology protocol    Answer:   Yes    Order Specific  Question:   Preferred imaging location?    Answer:   [id number]    Order Specific Question:   Radiology Contrast Protocol - do NOT remove file path    Answer:   [email or web address]    All questions were answered. The patient knows to call the clinic with any problems, questions or concerns. The total time spent in the appointment was 20 minutes encounter with patients including review of chart and various tests results, discussions about plan of care and coordination of care plan   [name], MD 09/09/2019 2:10 PM  INTERVAL HISTORY: Please see below for problem oriented charting. She returns for further follow-up She tolerated reduced dose everolimus better She had very minimal mucositis She complains of fatigue She is experiencing some intermittent neuropathic pain sensation especially in the evening when she lies down It does not bother her too much.  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  The patient's initial surgery was with Dr. Marti Sleigh in [place] on January 05, 2014.  It included an exploratory laparotomy with TAH, BSO, appendectomy, pelvic and para-aortic lymphadenectomy and wedge resection  of a concerning appearing liver lesion.  Preoperatively she presented with a 26 cm right adnexal mass and an elevated Ca1 25 at 404 units/mL.  At the completion of surgery there was no gross residual disease.  Final pathology showed a primary grade 3 endometrial adenocarcinoma (endometrioid) involving both ovaries.  Lymph nodes and other biopsies were negative.  She had  uncomplicated postoperative course.  She was treated with 6 cycles of carboplatin paclitaxel chemotherapy.  She was recommended adjuvant radiation therapy however declined this after counseling.  Following her initial therapy she developed severe left lower extremity lymphedema.   Follow-up CT scan in March 2016 showed no evidence of disease.  She was followed every 3 months for a total of 5 years with no recurrence appreciated during that time   03/28/2019 Imaging   Ct abdomen and pelvis 1. Enlarged right inguinal lymph node or soft tissue mass, measuring 3.8 x 3.5 cm (series 2, image 69). Additional enlarged right iliac lymph node measuring 1.9 x 1.1 cm (series 2, image 68). Findings are concerning for malignancy.   2. Status post hysterectomy per stated history of endometrial malignancy.   3. No other findings in the abdomen or pelvis concerning for malignancy or metastatic disease.   4. There are multiple subcentimeter low-attenuation lesions of the liver, the largest in the left lobe the measuring 7 mm, too small to characterize (series 2, image 24). Attention on follow-up although these are most likely incidental benign small cysts and/or hemangiomata.   5.  Aortic atherosclerosis.   04/10/2019 Pathology Results   A. LYMPH NODE, RIGHT INGUINAL, NEEDLE CORE BIOPSY:  - Metastatic adenocarcinoma.  - See comment.   COMMENT:   The morphology is most consistent with metastatic endometrioid adenocarcinoma.  Immunohistochemistry can be performed if clinically indicated.  There is sufficient tissue for additional testing   04/10/2019 Procedure   Technically successful ultrasound guided biopsy of dominant right inguinal lymph node   04/22/2019 Cancer Staging   Staging form: Corpus Uteri - Carcinoma and Carcinosarcoma, AJCC 8th Edition - Pathologic stage from 04/22/2019: FIGO Stage IVB (rpT3a, pN0, pM1) - Signed by [name], MD on 04/22/2019   04/22/2019 PET scan   1. Two hypermetabolic  RIGHT iliac lymph nodes and larger metastatic RIGHT inguinal lymph node. 2. No evidence of metastatic disease outside the pelvis. 3. No clear evidence of activity at the vaginal cuff.   05/15/2019 Procedure   Placement of a subcutaneous port device. Catheter tip in the lower SVC   05/16/2019 - 06/12/2019 Chemotherapy   The patient had cisplatin for chemotherapy treatment.      Genetic Testing   Patient has genetic testing done for MSI. Results revealed patient has the following: MSI Stable   06/23/2019 Imaging   1. Interval decreased size of right inguinal and external iliac lymph nodes. 2. No evidence of progressive metastatic disease. 3. Stable small low-density hepatic lesions, likely cysts. 4. Aortic Atherosclerosis (ICD10-I70.0).     07/14/2019 -  Chemotherapy   The patient had everolimus and letrozole for chemotherapy treatment.       REVIEW OF SYSTEMS:   Constitutional: Denies fevers, chills or abnormal weight loss Eyes: Denies blurriness of vision Respiratory: Denies cough, dyspnea or wheezes Cardiovascular: Denies palpitation, chest discomfort Gastrointestinal:  Denies nausea, heartburn or change in bowel habits Skin: Denies abnormal skin rashes Lymphatics: Denies new lymphadenopathy or easy bruising Behavioral/Psych: Mood is stable, no new changes  All other systems were reviewed with the patient and are negative.  I have reviewed the past medical history, past surgical history, social history and family history with the patient and they are unchanged from previous note.  ALLERGIES:  is allergic to oxycodone; garlic; other; and penicillins.  MEDICATIONS:  Current Outpatient Medications  Medication Sig Dispense Refill  . amLODipine (NORVASC) 5 MG tablet Take 5 mg by mouth daily.     Marland Kitchen everolimus (AFINITOR) 5 MG tablet Take 1 tablet (5 mg total) by mouth daily. 28 tablet 11  . ibuprofen (ADVIL) 200 MG tablet Take 200 mg by mouth every 6 (six) hours as needed.     Marland Kitchen letrozole (FEMARA) 2.5 MG tablet Take 1 tablet (2.5 mg total) by mouth daily. 90 tablet 3  . lidocaine-prilocaine (EMLA) cream Apply to affected area once 30 g 3  . magic mouthwash w/lidocaine SOLN Take 5 mLs by mouth 3 (three) times daily as needed for mouth pain. 145 mL 0  . ondansetron (ZOFRAN) 8 MG tablet Take 1 tablet (8 mg total) by mouth every 8 (eight) hours as needed. 30 tablet 1  . prochlorperazine (COMPAZINE) 10 MG tablet Take 1 tablet (10 mg total) by mouth every 6 (six) hours as needed (Nausea or vomiting). 30 tablet 1  . traMADol (ULTRAM) 50 MG tablet TK 1 T PO  BID PRF PAINS.     No current facility-administered medications for this visit.    PHYSICAL EXAMINATION: ECOG PERFORMANCE STATUS: 1 - Symptomatic but completely ambulatory  Vitals:   09/09/19 1155  BP: 129/69  Pulse: 78  Resp: 18  Temp: 97.8 F (36.6 C)  SpO2: 100%   Filed Weights   09/09/19 1155  Weight: 177 lb 12.8 oz (80.6 kg)    GENERAL:alert, no distress and comfortable SKIN: skin color, texture, turgor are normal, no rashes or significant lesions EYES: normal, Conjunctiva are pink and non-injected, sclera clear OROPHARYNX:no exudate, no erythema and lips, buccal mucosa, and tongue normal  NECK: supple, thyroid normal size, non-tender, without nodularity LYMPH:  no palpable lymphadenopathy in the cervical, axillary or inguinal LUNGS: clear to auscultation and percussion with normal breathing effort HEART: regular rate & rhythm and no murmurs with persistent bilateral lower extremity edema ABDOMEN:abdomen soft, non-tender and normal bowel sounds Musculoskeletal:no cyanosis of digits and no clubbing  NEURO: alert & oriented x 3 with fluent speech, no focal motor/sensory deficits  LABORATORY DATA:  I have reviewed the data as listed    Component Value Date/Time   NA 139 09/09/2019 1125   K 3.8 09/09/2019 1125   CL 106 09/09/2019 1125   CO2 24 09/09/2019 1125   GLUCOSE 88 09/09/2019 1125    BUN 8 09/09/2019 1125   CREATININE 0.77 09/09/2019 1125   CALCIUM 8.9 09/09/2019 1125   PROT 6.6 09/09/2019 1125   ALBUMIN 3.3 (L) 09/09/2019 1125   AST 16 09/09/2019 1125   ALT 12 09/09/2019 1125   ALKPHOS 99 09/09/2019 1125   BILITOT 0.3 09/09/2019 1125   GFRNONAA >60 09/09/2019 1125   GFRAA >60 09/09/2019 1125    No results found for: SPEP, UPEP  Lab Results  Component Value Date   WBC 4.1 09/09/2019   NEUTROABS 2.8 09/09/2019   HGB 12.2 09/09/2019   HCT 37.5 09/09/2019   MCV 92.8 09/09/2019   PLT 282 09/09/2019      Chemistry      Component Value Date/Time   NA 139 09/09/2019 1125   K 3.8 09/09/2019 1125   CL 106 09/09/2019 1125   CO2 24 09/09/2019 1125  BUN 8 09/09/2019 1125   CREATININE 0.77 09/09/2019 1125      Component Value Date/Time   CALCIUM 8.9 09/09/2019 1125   ALKPHOS 99 09/09/2019 1125   AST 16 09/09/2019 1125   ALT 12 09/09/2019 1125   BILITOT 0.3 09/09/2019 1125

## 2019-09-10 ENCOUNTER — Telehealth: Payer: Self-pay | Admitting: Hematology and Oncology

## 2019-09-10 NOTE — Telephone Encounter (Signed)
Scheduled appt per 2/23 sch message - mailed letter with appt date and time

## 2019-10-09 ENCOUNTER — Telehealth: Payer: Self-pay

## 2019-10-09 NOTE — Telephone Encounter (Signed)
Called and given below message. She wants to leave appts as scheduled. 

## 2019-10-09 NOTE — Telephone Encounter (Signed)
-----   Message from [name], MD sent at 10/09/2019  1:21 PM EDT ----- Regarding: labs flush I think it would make more sense for her to get labs flush before CT instead of the day she sees me Can you call and ask if she is willing to come in a bit early? The flush nurse can also leave her port accessed for CT

## 2019-10-20 ENCOUNTER — Ambulatory Visit
Admission: RE | Admit: 2019-10-20 | Discharge: 2019-10-20 | Disposition: A | Discharge status: Home / Self Care | Payer: Medicare HMO | Source: Ambulatory Visit | Attending: Hematology and Oncology | Admitting: Hematology and Oncology

## 2019-10-20 DIAGNOSIS — K7689 Other specified diseases of liver: Secondary | ICD-10-CM | POA: Diagnosis not present

## 2019-10-20 DIAGNOSIS — C55 Malignant neoplasm of uterus, part unspecified: Secondary | ICD-10-CM

## 2019-10-20 MED ORDER — IOPAMIDOL (ISOVUE-300) INJECTION 61%
100.0000 mL | Freq: Once | INTRAVENOUS | Status: AC | PRN
  Administered 2019-10-20: 100 mL via INTRAVENOUS

## 2019-10-21 ENCOUNTER — Other Ambulatory Visit: Payer: Self-pay

## 2019-10-21 ENCOUNTER — Encounter: Payer: Self-pay | Admitting: Hematology and Oncology

## 2019-10-21 ENCOUNTER — Inpatient Hospital Stay: Payer: Medicare HMO

## 2019-10-21 ENCOUNTER — Inpatient Hospital Stay: Payer: Medicare HMO | Attending: Gynecologic Oncology | Admitting: Hematology and Oncology

## 2019-10-21 VITALS — BP 147/77 | HR 65 | Temp 98.9°F | Resp 18 | Ht 61.0 in | Wt 175.6 lb

## 2019-10-21 DIAGNOSIS — T451X5A Adverse effect of antineoplastic and immunosuppressive drugs, initial encounter: Secondary | ICD-10-CM | POA: Diagnosis not present

## 2019-10-21 DIAGNOSIS — K1231 Oral mucositis (ulcerative) due to antineoplastic therapy: Secondary | ICD-10-CM

## 2019-10-21 DIAGNOSIS — R112 Nausea with vomiting, unspecified: Secondary | ICD-10-CM | POA: Diagnosis not present

## 2019-10-21 DIAGNOSIS — Z9221 Personal history of antineoplastic chemotherapy: Secondary | ICD-10-CM | POA: Diagnosis not present

## 2019-10-21 DIAGNOSIS — C541 Malignant neoplasm of endometrium: Secondary | ICD-10-CM | POA: Diagnosis present

## 2019-10-21 DIAGNOSIS — R5381 Other malaise: Secondary | ICD-10-CM | POA: Diagnosis not present

## 2019-10-21 DIAGNOSIS — Z79899 Other long term (current) drug therapy: Secondary | ICD-10-CM | POA: Diagnosis not present

## 2019-10-21 DIAGNOSIS — Z79811 Long term (current) use of aromatase inhibitors: Secondary | ICD-10-CM | POA: Insufficient documentation

## 2019-10-21 DIAGNOSIS — Z90722 Acquired absence of ovaries, bilateral: Secondary | ICD-10-CM | POA: Diagnosis not present

## 2019-10-21 DIAGNOSIS — I890 Lymphedema, not elsewhere classified: Secondary | ICD-10-CM | POA: Diagnosis not present

## 2019-10-21 DIAGNOSIS — M1611 Unilateral primary osteoarthritis, right hip: Secondary | ICD-10-CM

## 2019-10-21 DIAGNOSIS — Z923 Personal history of irradiation: Secondary | ICD-10-CM | POA: Diagnosis not present

## 2019-10-21 DIAGNOSIS — R634 Abnormal weight loss: Secondary | ICD-10-CM | POA: Insufficient documentation

## 2019-10-21 DIAGNOSIS — C55 Malignant neoplasm of uterus, part unspecified: Secondary | ICD-10-CM | POA: Diagnosis not present

## 2019-10-21 DIAGNOSIS — G620 Drug-induced polyneuropathy: Secondary | ICD-10-CM

## 2019-10-21 DIAGNOSIS — Z90710 Acquired absence of both cervix and uterus: Secondary | ICD-10-CM | POA: Diagnosis not present

## 2019-10-21 LAB — CBC WITH DIFFERENTIAL/PLATELET
Abs Immature Granulocytes: 0 10*3/uL (ref 0.00–0.07)
Basophils Absolute: 0 10*3/uL (ref 0.0–0.1)
Basophils Relative: 0 %
Eosinophils Absolute: 0.1 10*3/uL (ref 0.0–0.5)
Eosinophils Relative: 3 %
HCT: 39.2 % (ref 36.0–46.0)
Hemoglobin: 12.7 g/dL (ref 12.0–15.0)
Immature Granulocytes: 0 %
Lymphocytes Relative: 14 %
Lymphs Abs: 0.7 10*3/uL (ref 0.7–4.0)
MCH: 28.9 pg (ref 26.0–34.0)
MCHC: 32.4 g/dL (ref 30.0–36.0)
MCV: 89.3 fL (ref 80.0–100.0)
Monocytes Absolute: 0.5 10*3/uL (ref 0.1–1.0)
Monocytes Relative: 10 %
Neutro Abs: 3.5 10*3/uL (ref 1.7–7.7)
Neutrophils Relative %: 73 %
Platelets: 266 10*3/uL (ref 150–400)
RBC: 4.39 MIL/uL (ref 3.87–5.11)
RDW: 13 % (ref 11.5–15.5)
WBC: 4.9 10*3/uL (ref 4.0–10.5)
nRBC: 0 % (ref 0.0–0.2)

## 2019-10-21 LAB — COMPREHENSIVE METABOLIC PANEL
ALT: 11 U/L (ref 0–44)
AST: 14 U/L — ABNORMAL LOW (ref 15–41)
Albumin: 3.4 g/dL — ABNORMAL LOW (ref 3.5–5.0)
Alkaline Phosphatase: 89 U/L (ref 38–126)
Anion gap: 10 (ref 5–15)
BUN: 10 mg/dL (ref 8–23)
CO2: 25 mmol/L (ref 22–32)
Calcium: 9.5 mg/dL (ref 8.9–10.3)
Chloride: 105 mmol/L (ref 98–111)
Creatinine, Ser: 0.86 mg/dL (ref 0.44–1.00)
GFR calc Af Amer: >60 mL/min (ref >60)
GFR calc non Af Amer: >60 mL/min (ref >60)
Glucose, Bld: 83 mg/dL (ref 70–99)
Potassium: 4.2 mmol/L (ref 3.5–5.1)
Sodium: 140 mmol/L (ref 135–145)
Total Bilirubin: 0.3 mg/dL (ref 0.3–1.2)
Total Protein: 6.7 g/dL (ref 6.5–8.1)

## 2019-10-21 MED ORDER — AMLODIPINE BESYLATE 5 MG PO TABS: 5.0000 mg | ORAL_TABLET | Freq: Every day | ORAL | 11 refills | Status: DC

## 2019-10-21 MED ORDER — GABAPENTIN 300 MG PO CAPS: 300.0000 mg | ORAL_CAPSULE | Freq: Two times a day (BID) | ORAL | 11 refills | Status: DC

## 2019-10-21 MED ORDER — HEPARIN SOD (PORK) LOCK FLUSH 100 UNIT/ML IV SOLN
500.0000 [IU] | Freq: Once | INTRAVENOUS | Status: AC
  Administered 2019-10-21: 500 [IU]
  Filled 2019-10-21: qty 5

## 2019-10-21 MED ORDER — SODIUM CHLORIDE 0.9% FLUSH
10.0000 mL | Freq: Once | INTRAVENOUS | Status: AC
  Administered 2019-10-21: 10 mL
  Filled 2019-10-21: qty 10

## 2019-10-21 NOTE — Assessment & Plan Note (Signed)
Lymphedema is stable Observe for now 

## 2019-10-21 NOTE — Assessment & Plan Note (Signed)
She has some neuropathic pain I recommend trial of gabapentin We discussed some of the expected side effects of gabapentin I recommend she avoids ibuprofen due to risk of lymphedema and long-term undesirable side effects from NSAID

## 2019-10-21 NOTE — Progress Notes (Signed)
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[facility]) I have reviewed multiple imaging studies with the patient She has excellent response to treatment I recommend she continues on everolimus/letrozole combination treatment indefinitely I do not plan to repeat another imaging study for at least 6 months, due around October I will see her again in 2 months for further follow-up with blood work, port flush and exam  Lymphedema Lymphedema is stable Observe for now  Mucositis due to chemotherapy She continues to have significant mucositis even with recent dose changes For now, I recommend she takes a week break of everolimus, meaning, she will take daily treatment for 3 weeks and then take 1 week of We will call in Magic mouthwash as needed  Physical debility She is still quite debilitated from treatment  She needs to stay on current chemotherapy lifelong The patient is considered permanently disabled due to her disease and lifelong treatment and complications related to side effects of her chemotherapy  Peripheral neuropathy due to chemotherapy [facility]) She has some neuropathic pain I recommend trial of gabapentin We discussed some of the expected side effects of gabapentin I recommend she avoids ibuprofen due to risk of lymphedema and long-term undesirable side effects from NSAID   No orders of the defined types were placed in this encounter.   All questions were answered. The patient knows to call the clinic with any problems, questions or concerns. The total time spent in the appointment was 30 minutes encounter with patients including review of chart and various tests results, discussions about plan of care and coordination of care plan   [name], MD 10/21/2019 3:27 PM  INTERVAL  HISTORY: Please see below for problem oriented charting. She returns for further follow-up She tolerated treatment well except she continues to have mucositis by the third week of treatment and has to take a week break Denies nausea or diarrhea Her chronic lymphedema is about the same She complains of intermittent right lower quadrant pain that started on her back and radiate to the front it comes and goes It is related to certain positional changes No recent falls No recent infection, fever or chills No recent bleeding   SUMMARY OF ONCOLOGIC HISTORY: Oncology History Overview Note  ER 90%, PR 95% PD-L1 score 1% MSI Stable   Endometrioid adenocarcinoma of uterus ([place])  01/05/2014 Initial Diagnosis   The patient has a history of a 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  The patient's initial surgery was with Dr. Marti Sleigh in [place] on January 05, 2014.  It included an exploratory laparotomy with TAH, BSO, appendectomy, pelvic and para-aortic lymphadenectomy and wedge resection of a concerning appearing liver lesion.  Preoperatively she presented with a 26 cm right adnexal mass and an elevated Ca1 25 at 404 units/mL.  At the completion of surgery there was no gross residual disease.  Final pathology showed a primary grade 3 endometrial adenocarcinoma (endometrioid) involving both ovaries.  Lymph nodes and other biopsies were negative.  She had uncomplicated postoperative course.  She was treated with 6 cycles of carboplatin paclitaxel chemotherapy.  She was recommended adjuvant radiation therapy however declined this after counseling.  Following her initial therapy she developed severe left lower extremity lymphedema.   Follow-up CT scan in March 2016  showed no evidence of disease.  She was followed every 3 months for a total of 5 years with no recurrence appreciated during that time   03/28/2019 Imaging   Ct abdomen and  pelvis 1. Enlarged right inguinal lymph node or soft tissue mass, measuring 3.8 x 3.5 cm (series 2, image 69). Additional enlarged right iliac lymph node measuring 1.9 x 1.1 cm (series 2, image 68). Findings are concerning for malignancy.   2. Status post hysterectomy per stated history of endometrial malignancy.   3. No other findings in the abdomen or pelvis concerning for malignancy or metastatic disease.   4. There are multiple subcentimeter low-attenuation lesions of the liver, the largest in the left lobe the measuring 7 mm, too small to characterize (series 2, image 24). Attention on follow-up although these are most likely incidental benign small cysts and/or hemangiomata.   5.  Aortic atherosclerosis.   04/10/2019 Pathology Results   A. LYMPH NODE, RIGHT INGUINAL, NEEDLE CORE BIOPSY:  - Metastatic adenocarcinoma.  - See comment.   COMMENT:   The morphology is most consistent with metastatic endometrioid adenocarcinoma.  Immunohistochemistry can be performed if clinically indicated.  There is sufficient tissue for additional testing   04/10/2019 Procedure   Technically successful ultrasound guided biopsy of dominant right inguinal lymph node   04/22/2019 Cancer Staging   Staging form: Corpus Uteri - Carcinoma and Carcinosarcoma, AJCC 8th Edition - Pathologic stage from 04/22/2019: FIGO Stage IVB (rpT3a, pN0, pM1) - Signed by [name], MD on 04/22/2019   04/22/2019 PET scan   1. Two hypermetabolic RIGHT iliac lymph nodes and larger metastatic RIGHT inguinal lymph node. 2. No evidence of metastatic disease outside the pelvis. 3. No clear evidence of activity at the vaginal cuff.   05/15/2019 Procedure   Placement of a subcutaneous port device. Catheter tip in the lower SVC   05/16/2019 - 06/12/2019 Chemotherapy   The patient had cisplatin for chemotherapy treatment.      Genetic Testing   Patient has genetic testing done for MSI. Results revealed patient has the  following: MSI Stable   06/23/2019 Imaging   1. Interval decreased size of right inguinal and external iliac lymph nodes. 2. No evidence of progressive metastatic disease. 3. Stable small low-density hepatic lesions, likely cysts. 4. Aortic Atherosclerosis (ICD10-I70.0).     07/14/2019 -  Chemotherapy   The patient had everolimus and letrozole for chemotherapy treatment.     10/20/2019 Imaging   1. No acute abdominal/pelvic findings, mass lesions or adenopathy. 2. Interval resolution of right iliac lymph node. No residual measurable lymph node is identified. Decrease in size of the right inguinal node. 3. Stable scattered hepatic cysts.   Aortic Atherosclerosis (ICD10-I70.0).     REVIEW OF SYSTEMS:   Constitutional: Denies fevers, chills or abnormal weight loss Eyes: Denies blurriness of vision Respiratory: Denies cough, dyspnea or wheezes Gastrointestinal:  Denies nausea, heartburn or change in bowel habits Skin: Denies abnormal skin rashes Lymphatics: Denies new lymphadenopathy or easy bruising Neurological:Denies numbness, tingling or new weaknesses Behavioral/Psych: Mood is stable, no new changes  All other systems were reviewed with the patient and are negative.  I have reviewed the past medical history, past surgical history, social history and family history with the patient and they are unchanged from previous note.  ALLERGIES:  is allergic to oxycodone; garlic; other; and penicillins.  MEDICATIONS:  Current Outpatient Medications  Medication Sig Dispense Refill  . amLODipine (NORVASC) 5 MG tablet Take 1 tablet (5 mg  total) by mouth daily. 90 tablet 11  . everolimus (AFINITOR) 5 MG tablet Take 1 tablet (5 mg total) by mouth daily. 28 tablet 11  . gabapentin (NEURONTIN) 300 MG capsule Take 1 capsule (300 mg total) by mouth 2 (two) times daily. 60 capsule 11  . ibuprofen (ADVIL) 200 MG tablet Take 200 mg by mouth every 6 (six) hours as needed.    Marland Kitchen letrozole (FEMARA)  2.5 MG tablet Take 1 tablet (2.5 mg total) by mouth daily. 90 tablet 3  . lidocaine-prilocaine (EMLA) cream Apply to affected area once 30 g 3  . magic mouthwash w/lidocaine SOLN Take 5 mLs by mouth 3 (three) times daily as needed for mouth pain. 145 mL 0  . ondansetron (ZOFRAN) 8 MG tablet Take 1 tablet (8 mg total) by mouth every 8 (eight) hours as needed. 30 tablet 1  . prochlorperazine (COMPAZINE) 10 MG tablet Take 1 tablet (10 mg total) by mouth every 6 (six) hours as needed (Nausea or vomiting). 30 tablet 1  . traMADol (ULTRAM) 50 MG tablet TK 1 T PO  BID PRF PAINS.     No current facility-administered medications for this visit.    PHYSICAL EXAMINATION: ECOG PERFORMANCE STATUS: 2 - Symptomatic, <50% confined to bed  Vitals:   10/21/19 1012  BP: (!) 147/77  Pulse: 65  Resp: 18  Temp: 98.9 F (37.2 C)  SpO2: 100%   Filed Weights   10/21/19 1012  Weight: 175 lb 9.6 oz (79.7 kg)    GENERAL:alert, no distress and comfortable HEART: She has chronic bilateral lower extremity edema Musculoskeletal:no cyanosis of digits and no clubbing  NEURO: alert & oriented x 3 with fluent speech, no focal motor/sensory deficits  LABORATORY DATA:  I have reviewed the data as listed    Component Value Date/Time   NA 140 10/21/2019 0947   K 4.2 10/21/2019 0947   CL 105 10/21/2019 0947   CO2 25 10/21/2019 0947   GLUCOSE 83 10/21/2019 0947   BUN 10 10/21/2019 0947   CREATININE 0.86 10/21/2019 0947   CALCIUM 9.5 10/21/2019 0947   PROT 6.7 10/21/2019 0947   ALBUMIN 3.4 (L) 10/21/2019 0947   AST 14 (L) 10/21/2019 0947   ALT 11 10/21/2019 0947   ALKPHOS 89 10/21/2019 0947   BILITOT 0.3 10/21/2019 0947   GFRNONAA >60 10/21/2019 0947   GFRAA >60 10/21/2019 0947    No results found for: SPEP, UPEP  Lab Results  Component Value Date   WBC 4.9 10/21/2019   NEUTROABS 3.5 10/21/2019   HGB 12.7 10/21/2019   HCT 39.2 10/21/2019   MCV 89.3 10/21/2019   PLT 266 10/21/2019       Chemistry      Component Value Date/Time   NA 140 10/21/2019 0947   K 4.2 10/21/2019 0947   CL 105 10/21/2019 0947   CO2 25 10/21/2019 0947   BUN 10 10/21/2019 0947   CREATININE 0.86 10/21/2019 0947      Component Value Date/Time   CALCIUM 9.5 10/21/2019 0947   ALKPHOS 89 10/21/2019 0947   AST 14 (L) 10/21/2019 0947   ALT 11 10/21/2019 0947   BILITOT 0.3 10/21/2019 0947       RADIOGRAPHIC STUDIES: I have reviewed multiple CT imaging with the patient extensively I have personally reviewed the radiological images as listed and agreed with the findings in the report. CT ABDOMEN PELVIS W CONTRAST  Result Date: 10/20/2019 CLINICAL DATA:  Right lower quadrant and back pain. Prior history  of uterine cancer. EXAM: CT ABDOMEN AND PELVIS WITH CONTRAST TECHNIQUE: Multidetector CT imaging of the abdomen and pelvis was performed using the standard protocol following bolus administration of intravenous contrast. CONTRAST:  13m ISOVUE-300 IOPAMIDOL (ISOVUE-300) INJECTION 61% COMPARISON:  06/23/2019 FINDINGS: Lower chest: The lung bases are clear of an acute process. No worrisome pulmonary lesions. The heart is normal in size. No pericardial effusion. Small hiatal hernia. Hepatobiliary: Stable scattered hepatic cysts. No worrisome hepatic lesions. The gallbladder is normal. No intra or extrahepatic biliary dilatation. Pancreas: No mass, inflammation or ductal dilatation. Spleen: Normal size. No focal lesions. Adrenals/Urinary Tract: The adrenal glands and kidneys are unremarkable and stable. No renal lesions or hydronephrosis. The bladder is unremarkable. Stomach/Bowel: The stomach, duodenum, small bowel and colon are unremarkable. No acute inflammatory changes, mass lesions or obstructive findings. The terminal ileum is normal. The appendix is not identified. Fairly extensive colonic diverticulosis without findings for acute diverticulitis. Vascular/Lymphatic: Stable scattered aortic calcifications. No  aneurysm or dissection. The branch vessels are patent. The major venous structures are patent. No mesenteric or retroperitoneal mass or adenopathy. The right iliac lymph node has resolved. No residual measurable lymph node is identified. The right inguinal node measures 9.5 mm and previously measured 13.5 mm. Reproductive: Status post hysterectomy. Other: No pelvic mass or adenopathy. No free pelvic fluid collections. No inguinal mass or adenopathy. Small anterior abdominal wall hernia containing fat. No omental or peritoneal surface disease identified. Musculoskeletal: No significant bony findings. IMPRESSION: 1. No acute abdominal/pelvic findings, mass lesions or adenopathy. 2. Interval resolution of right iliac lymph node. No residual measurable lymph node is identified. Decrease in size of the right inguinal node. 3. Stable scattered hepatic cysts. Aortic Atherosclerosis (ICD10-I70.0). Electronically Signed   By: PMarijo SanesM.D.   On: 10/20/2019 11:56

## 2019-10-21 NOTE — Assessment & Plan Note (Signed)
She is still quite debilitated from treatment  She needs to stay on current chemotherapy lifelong The patient is considered permanently disabled due to her disease and lifelong treatment and complications related to side effects of her chemotherapy 

## 2019-10-21 NOTE — Assessment & Plan Note (Signed)
I have reviewed multiple imaging studies with the patient She has excellent response to treatment I recommend she continues on everolimus/letrozole combination treatment indefinitely I do not plan to repeat another imaging study for at least 6 months, due around October I will see her again in 2 months for further follow-up with blood work, port flush and exam

## 2019-10-21 NOTE — Assessment & Plan Note (Signed)
She continues to have significant mucositis even with recent dose changes For now, I recommend she takes a week break of everolimus, meaning, she will take daily treatment for 3 weeks and then take 1 week of We will call in Magic mouthwash as needed

## 2019-10-22 ENCOUNTER — Telehealth: Payer: Self-pay | Admitting: Hematology and Oncology

## 2019-10-22 NOTE — Telephone Encounter (Signed)
Scheduled appt per 4/6 sch message - pt is aware of appt date and time

## 2019-10-23 ENCOUNTER — Telehealth: Payer: Self-pay

## 2019-10-23 NOTE — Telephone Encounter (Signed)
Called and scheduled appt with Dr. Alvy Bimler for 4/16 at 930 am. She verbalized understanding.

## 2019-10-31 ENCOUNTER — Encounter: Payer: Self-pay | Admitting: Hematology and Oncology

## 2019-10-31 ENCOUNTER — Other Ambulatory Visit: Payer: Self-pay

## 2019-10-31 ENCOUNTER — Inpatient Hospital Stay: Payer: Medicare HMO | Admitting: Hematology and Oncology

## 2019-10-31 DIAGNOSIS — Z923 Personal history of irradiation: Secondary | ICD-10-CM | POA: Diagnosis not present

## 2019-10-31 DIAGNOSIS — K1231 Oral mucositis (ulcerative) due to antineoplastic therapy: Secondary | ICD-10-CM | POA: Diagnosis not present

## 2019-10-31 DIAGNOSIS — R5381 Other malaise: Secondary | ICD-10-CM

## 2019-10-31 DIAGNOSIS — R634 Abnormal weight loss: Secondary | ICD-10-CM | POA: Diagnosis not present

## 2019-10-31 DIAGNOSIS — R197 Diarrhea, unspecified: Secondary | ICD-10-CM

## 2019-10-31 DIAGNOSIS — R109 Unspecified abdominal pain: Secondary | ICD-10-CM | POA: Insufficient documentation

## 2019-10-31 DIAGNOSIS — R1084 Generalized abdominal pain: Secondary | ICD-10-CM

## 2019-10-31 DIAGNOSIS — C541 Malignant neoplasm of endometrium: Secondary | ICD-10-CM | POA: Diagnosis not present

## 2019-10-31 DIAGNOSIS — Z90722 Acquired absence of ovaries, bilateral: Secondary | ICD-10-CM | POA: Diagnosis not present

## 2019-10-31 DIAGNOSIS — R112 Nausea with vomiting, unspecified: Secondary | ICD-10-CM | POA: Diagnosis not present

## 2019-10-31 DIAGNOSIS — I890 Lymphedema, not elsewhere classified: Secondary | ICD-10-CM | POA: Diagnosis not present

## 2019-10-31 DIAGNOSIS — Z79811 Long term (current) use of aromatase inhibitors: Secondary | ICD-10-CM | POA: Diagnosis not present

## 2019-10-31 DIAGNOSIS — Z9221 Personal history of antineoplastic chemotherapy: Secondary | ICD-10-CM | POA: Diagnosis not present

## 2019-10-31 DIAGNOSIS — C55 Malignant neoplasm of uterus, part unspecified: Secondary | ICD-10-CM

## 2019-10-31 DIAGNOSIS — Z90710 Acquired absence of both cervix and uterus: Secondary | ICD-10-CM | POA: Diagnosis not present

## 2019-10-31 NOTE — Progress Notes (Signed)
[place] [facility] OFFICE PROGRESS NOTE  Patient Care Team: Shon Baton, MD as PCP - General (Internal Medicine) Awanda Mink Craige Cotta, RN as Oncology Nurse Navigator (Oncology)  ASSESSMENT & PLAN:  Endometrioid adenocarcinoma of uterus [facility]) Due to her feeling unwell with GI symptoms, her treatment is placed on hold I will call her next week for further follow-up Continue supportive care  Diarrhea I have reviewed recent CT imaging There is nothing on exam and CT imaging that could explain her symptoms except she would likely have viral gastroenteritis Her symptoms are not suggestive of diverticulitis I recommend conservative approach with hydration, bowel rest and Imodium as needed She will hold her treatment for now  Abdominal pain Her examination did not reveal any signs of peritonitis I recommend she takes pain medicine as needed and bowel rest  Nausea and vomiting I recommend antiemetics as tolerated Her treatment is placed on hold She felt that she is able to hydrate herself adequately and does not need IV fluids today  Physical debility She is still quite debilitated from treatment  She needs to stay on current chemotherapy lifelong The patient is considered permanently disabled due to her disease and lifelong treatment and complications related to side effects of her chemotherapy   No orders of the defined types were placed in this encounter.   All questions were answered. The patient knows to call the clinic with any problems, questions or concerns. The total time spent in the appointment was 25 minutes encounter with patients including review of chart and various tests results, discussions about plan of care and coordination of care plan   [name], MD 10/31/2019 11:28 AM  INTERVAL HISTORY: Please see below for problem oriented charting. She is originally scheduled to see me to discuss about paperwork for FMLA 3 days ago, she started to feel unwell She  started to have abdominal pain that is generalized, nausea with vomiting and diarrhea Her nausea and vomiting has slightly improved today but she did not take any antiemetics She did not take any Imodium She is able to eat and hydrate herself Her pain is somewhat slightly better, is near the flank region She denies fever or chills She has lost some appetite and lost some weight She has not taken any chemotherapy since Monday  SUMMARY OF ONCOLOGIC HISTORY: Oncology History Overview Note  ER 90%, PR 95% PD-L1 score 1% MSI Stable   Endometrioid adenocarcinoma of uterus ([place])  01/05/2014 Initial Diagnosis   The patient has a history of a 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  The patient's initial surgery was with Dr. Marti Sleigh in [place] on January 05, 2014.  It included an exploratory laparotomy with TAH, BSO, appendectomy, pelvic and para-aortic lymphadenectomy and wedge resection of a concerning appearing liver lesion.  Preoperatively she presented with a 26 cm right adnexal mass and an elevated Ca1 25 at 404 units/mL.  At the completion of surgery there was no gross residual disease.  Final pathology showed a primary grade 3 endometrial adenocarcinoma (endometrioid) involving both ovaries.  Lymph nodes and other biopsies were negative.  She had uncomplicated postoperative course.  She was treated with 6 cycles of carboplatin paclitaxel chemotherapy.  She was recommended adjuvant radiation therapy however declined this after counseling.  Following her initial therapy she developed severe left lower extremity lymphedema.   Follow-up CT scan in March 2016 showed no evidence of disease.  She was followed every  3 months for a total of 5 years with no recurrence appreciated during that time   03/28/2019 Imaging   Ct abdomen and pelvis 1. Enlarged right inguinal lymph node or soft tissue mass, measuring 3.8 x 3.5 cm (series 2, image  69). Additional enlarged right iliac lymph node measuring 1.9 x 1.1 cm (series 2, image 68). Findings are concerning for malignancy.   2. Status post hysterectomy per stated history of endometrial malignancy.   3. No other findings in the abdomen or pelvis concerning for malignancy or metastatic disease.   4. There are multiple subcentimeter low-attenuation lesions of the liver, the largest in the left lobe the measuring 7 mm, too small to characterize (series 2, image 24). Attention on follow-up although these are most likely incidental benign small cysts and/or hemangiomata.   5.  Aortic atherosclerosis.   04/10/2019 Pathology Results   A. LYMPH NODE, RIGHT INGUINAL, NEEDLE CORE BIOPSY:  - Metastatic adenocarcinoma.  - See comment.   COMMENT:   The morphology is most consistent with metastatic endometrioid adenocarcinoma.  Immunohistochemistry can be performed if clinically indicated.  There is sufficient tissue for additional testing   04/10/2019 Procedure   Technically successful ultrasound guided biopsy of dominant right inguinal lymph node   04/22/2019 Cancer Staging   Staging form: Corpus Uteri - Carcinoma and Carcinosarcoma, AJCC 8th Edition - Pathologic stage from 04/22/2019: FIGO Stage IVB (rpT3a, pN0, pM1) - Signed by [name], MD on 04/22/2019   04/22/2019 PET scan   1. Two hypermetabolic RIGHT iliac lymph nodes and larger metastatic RIGHT inguinal lymph node. 2. No evidence of metastatic disease outside the pelvis. 3. No clear evidence of activity at the vaginal cuff.   05/15/2019 Procedure   Placement of a subcutaneous port device. Catheter tip in the lower SVC   05/16/2019 - 06/12/2019 Chemotherapy   The patient had cisplatin for chemotherapy treatment.      Genetic Testing   Patient has genetic testing done for MSI. Results revealed patient has the following: MSI Stable   06/23/2019 Imaging   1. Interval decreased size of right inguinal and external iliac  lymph nodes. 2. No evidence of progressive metastatic disease. 3. Stable small low-density hepatic lesions, likely cysts. 4. Aortic Atherosclerosis (ICD10-I70.0).     07/14/2019 -  Chemotherapy   The patient had everolimus and letrozole for chemotherapy treatment.     10/20/2019 Imaging   1. No acute abdominal/pelvic findings, mass lesions or adenopathy. 2. Interval resolution of right iliac lymph node. No residual measurable lymph node is identified. Decrease in size of the right inguinal node. 3. Stable scattered hepatic cysts.   Aortic Atherosclerosis (ICD10-I70.0).     REVIEW OF SYSTEMS:   Constitutional: Denies fevers, chills  Eyes: Denies blurriness of vision Ears, nose, mouth, throat, and face: Denies mucositis or sore throat Respiratory: Denies cough, dyspnea or wheezes Cardiovascular: Denies palpitation, chest discomfort or lower extremity swelling Skin: Denies abnormal skin rashes Lymphatics: Denies new lymphadenopathy or easy bruising Behavioral/Psych: Mood is stable, no new changes  All other systems were reviewed with the patient and are negative.  I have reviewed the past medical history, past surgical history, social history and family history with the patient and they are unchanged from previous note.  ALLERGIES:  is allergic to oxycodone; garlic; other; and penicillins.  MEDICATIONS:  Current Outpatient Medications  Medication Sig Dispense Refill  . amLODipine (NORVASC) 5 MG tablet Take 1 tablet (5 mg total) by mouth daily. 90 tablet 11  .  everolimus (AFINITOR) 5 MG tablet Take 1 tablet (5 mg total) by mouth daily. 28 tablet 11  . gabapentin (NEURONTIN) 300 MG capsule Take 1 capsule (300 mg total) by mouth 2 (two) times daily. 60 capsule 11  . ibuprofen (ADVIL) 200 MG tablet Take 200 mg by mouth every 6 (six) hours as needed.    Marland Kitchen letrozole (FEMARA) 2.5 MG tablet Take 1 tablet (2.5 mg total) by mouth daily. 90 tablet 3  . lidocaine-prilocaine (EMLA) cream  Apply to affected area once 30 g 3  . magic mouthwash w/lidocaine SOLN Take 5 mLs by mouth 3 (three) times daily as needed for mouth pain. 145 mL 0  . ondansetron (ZOFRAN) 8 MG tablet Take 1 tablet (8 mg total) by mouth every 8 (eight) hours as needed. 30 tablet 1  . prochlorperazine (COMPAZINE) 10 MG tablet Take 1 tablet (10 mg total) by mouth every 6 (six) hours as needed (Nausea or vomiting). 30 tablet 1  . traMADol (ULTRAM) 50 MG tablet TK 1 T PO  BID PRF PAINS.     No current facility-administered medications for this visit.    PHYSICAL EXAMINATION: ECOG PERFORMANCE STATUS: 1 - Symptomatic but completely ambulatory  Vitals:   10/31/19 0945  BP: 129/79  Pulse: 86  Resp: 18  Temp: 98.2 F (36.8 C)  SpO2: 98%   Filed Weights   10/31/19 0945  Weight: 171 lb 12.8 oz (77.9 kg)    GENERAL:alert, no distress and comfortable SKIN: skin color, texture, turgor are normal, no rashes or significant lesions EYES: normal, Conjunctiva are pink and non-injected, sclera clear OROPHARYNX:no exudate, no erythema and lips, buccal mucosa, and tongue normal  NECK: supple, thyroid normal size, non-tender, without nodularity LYMPH:  no palpable lymphadenopathy in the cervical, axillary or inguinal LUNGS: clear to auscultation and percussion with normal breathing effort HEART: regular rate & rhythm and no murmurs and no lower extremity edema ABDOMEN:abdomen soft, mild tender but no rebound or guarding and normal bowel sounds Musculoskeletal:no cyanosis of digits and no clubbing  NEURO: alert & oriented x 3 with fluent speech, no focal motor/sensory deficits  LABORATORY DATA:  I have reviewed the data as listed    Component Value Date/Time   NA 140 10/21/2019 0947   K 4.2 10/21/2019 0947   CL 105 10/21/2019 0947   CO2 25 10/21/2019 0947   GLUCOSE 83 10/21/2019 0947   BUN 10 10/21/2019 0947   CREATININE 0.86 10/21/2019 0947   CALCIUM 9.5 10/21/2019 0947   PROT 6.7 10/21/2019 0947    ALBUMIN 3.4 (L) 10/21/2019 0947   AST 14 (L) 10/21/2019 0947   ALT 11 10/21/2019 0947   ALKPHOS 89 10/21/2019 0947   BILITOT 0.3 10/21/2019 0947   GFRNONAA >60 10/21/2019 0947   GFRAA >60 10/21/2019 0947    No results found for: SPEP, UPEP  Lab Results  Component Value Date   WBC 4.9 10/21/2019   NEUTROABS 3.5 10/21/2019   HGB 12.7 10/21/2019   HCT 39.2 10/21/2019   MCV 89.3 10/21/2019   PLT 266 10/21/2019      Chemistry      Component Value Date/Time   NA 140 10/21/2019 0947   K 4.2 10/21/2019 0947   CL 105 10/21/2019 0947   CO2 25 10/21/2019 0947   BUN 10 10/21/2019 0947   CREATININE 0.86 10/21/2019 0947      Component Value Date/Time   CALCIUM 9.5 10/21/2019 0947   ALKPHOS 89 10/21/2019 0947   AST 14 (L)  10/21/2019 0947   ALT 11 10/21/2019 0947   BILITOT 0.3 10/21/2019 0947       RADIOGRAPHIC STUDIES: I have personally reviewed the radiological images as listed and agreed with the findings in the report. CT ABDOMEN PELVIS W CONTRAST  Result Date: 10/20/2019 CLINICAL DATA:  Right lower quadrant and back pain. Prior history of uterine cancer. EXAM: CT ABDOMEN AND PELVIS WITH CONTRAST TECHNIQUE: Multidetector CT imaging of the abdomen and pelvis was performed using the standard protocol following bolus administration of intravenous contrast. CONTRAST:  178m ISOVUE-300 IOPAMIDOL (ISOVUE-300) INJECTION 61% COMPARISON:  06/23/2019 FINDINGS: Lower chest: The lung bases are clear of an acute process. No worrisome pulmonary lesions. The heart is normal in size. No pericardial effusion. Small hiatal hernia. Hepatobiliary: Stable scattered hepatic cysts. No worrisome hepatic lesions. The gallbladder is normal. No intra or extrahepatic biliary dilatation. Pancreas: No mass, inflammation or ductal dilatation. Spleen: Normal size. No focal lesions. Adrenals/Urinary Tract: The adrenal glands and kidneys are unremarkable and stable. No renal lesions or hydronephrosis. The bladder is  unremarkable. Stomach/Bowel: The stomach, duodenum, small bowel and colon are unremarkable. No acute inflammatory changes, mass lesions or obstructive findings. The terminal ileum is normal. The appendix is not identified. Fairly extensive colonic diverticulosis without findings for acute diverticulitis. Vascular/Lymphatic: Stable scattered aortic calcifications. No aneurysm or dissection. The branch vessels are patent. The major venous structures are patent. No mesenteric or retroperitoneal mass or adenopathy. The right iliac lymph node has resolved. No residual measurable lymph node is identified. The right inguinal node measures 9.5 mm and previously measured 13.5 mm. Reproductive: Status post hysterectomy. Other: No pelvic mass or adenopathy. No free pelvic fluid collections. No inguinal mass or adenopathy. Small anterior abdominal wall hernia containing fat. No omental or peritoneal surface disease identified. Musculoskeletal: No significant bony findings. IMPRESSION: 1. No acute abdominal/pelvic findings, mass lesions or adenopathy. 2. Interval resolution of right iliac lymph node. No residual measurable lymph node is identified. Decrease in size of the right inguinal node. 3. Stable scattered hepatic cysts. Aortic Atherosclerosis (ICD10-I70.0). Electronically Signed   By: PMarijo SanesM.D.   On: 10/20/2019 11:56

## 2019-10-31 NOTE — Assessment & Plan Note (Signed)
She is still quite debilitated from treatment  She needs to stay on current chemotherapy lifelong The patient is considered permanently disabled due to her disease and lifelong treatment and complications related to side effects of her chemotherapy 

## 2019-10-31 NOTE — Assessment & Plan Note (Signed)
I recommend antiemetics as tolerated Her treatment is placed on hold She felt that she is able to hydrate herself adequately and does not need IV fluids today

## 2019-10-31 NOTE — Assessment & Plan Note (Signed)
Her examination did not reveal any signs of peritonitis I recommend she takes pain medicine as needed and bowel rest

## 2019-10-31 NOTE — Assessment & Plan Note (Signed)
I have reviewed recent CT imaging There is nothing on exam and CT imaging that could explain her symptoms except she would likely have viral gastroenteritis Her symptoms are not suggestive of diverticulitis I recommend conservative approach with hydration, bowel rest and Imodium as needed She will hold her treatment for now

## 2019-10-31 NOTE — Assessment & Plan Note (Signed)
Due to her feeling unwell with GI symptoms, her treatment is placed on hold I will call her next week for further follow-up Continue supportive care

## 2019-11-04 ENCOUNTER — Telehealth: Payer: Self-pay

## 2019-11-04 NOTE — Telephone Encounter (Signed)
Called and given below message. She verbalized understanding. She abdominal pain and GI symptoms are much better. She is still following the bland diet recommendtions.  Disability paper work has been completed.

## 2019-11-04 NOTE — Telephone Encounter (Signed)
If she is doing ok, please resume taking her affinitor

## 2019-11-04 NOTE — Telephone Encounter (Signed)
Called back and given below message. She verbalized understanding. She started taking the Affinitor this morning.

## 2019-11-04 NOTE — Telephone Encounter (Signed)
-----   Message from [name], MD sent at 11/04/2019  7:27 AM EDT ----- Regarding: how is she doing Can you call her and ask if her abdominal pain and GI symptoms have resolved? Also check status of her disability paperwork

## 2019-11-17 DIAGNOSIS — Z961 Presence of intraocular lens: Secondary | ICD-10-CM | POA: Diagnosis not present

## 2019-11-17 DIAGNOSIS — H43813 Vitreous degeneration, bilateral: Secondary | ICD-10-CM | POA: Diagnosis not present

## 2019-11-17 DIAGNOSIS — H26491 Other secondary cataract, right eye: Secondary | ICD-10-CM | POA: Diagnosis not present

## 2019-11-17 DIAGNOSIS — Q141 Congenital malformation of retina: Secondary | ICD-10-CM | POA: Diagnosis not present

## 2019-12-12 DIAGNOSIS — H26491 Other secondary cataract, right eye: Secondary | ICD-10-CM | POA: Diagnosis not present

## 2019-12-12 DIAGNOSIS — Z961 Presence of intraocular lens: Secondary | ICD-10-CM | POA: Diagnosis not present

## 2019-12-16 ENCOUNTER — Other Ambulatory Visit: Payer: Self-pay | Admitting: Hematology and Oncology

## 2019-12-16 ENCOUNTER — Inpatient Hospital Stay: Payer: Medicare HMO

## 2019-12-16 ENCOUNTER — Other Ambulatory Visit: Payer: Self-pay

## 2019-12-16 ENCOUNTER — Encounter: Payer: Self-pay | Admitting: Hematology and Oncology

## 2019-12-16 ENCOUNTER — Telehealth: Payer: Self-pay | Admitting: Hematology and Oncology

## 2019-12-16 ENCOUNTER — Inpatient Hospital Stay: Payer: Medicare HMO | Attending: Gynecologic Oncology | Admitting: Hematology and Oncology

## 2019-12-16 DIAGNOSIS — I890 Lymphedema, not elsewhere classified: Secondary | ICD-10-CM | POA: Insufficient documentation

## 2019-12-16 DIAGNOSIS — C541 Malignant neoplasm of endometrium: Secondary | ICD-10-CM | POA: Insufficient documentation

## 2019-12-16 DIAGNOSIS — R197 Diarrhea, unspecified: Secondary | ICD-10-CM | POA: Insufficient documentation

## 2019-12-16 DIAGNOSIS — R5381 Other malaise: Secondary | ICD-10-CM

## 2019-12-16 DIAGNOSIS — C55 Malignant neoplasm of uterus, part unspecified: Secondary | ICD-10-CM

## 2019-12-16 LAB — CBC WITH DIFFERENTIAL/PLATELET
Abs Immature Granulocytes: 0.01 10*3/uL (ref 0.00–0.07)
Basophils Absolute: 0 10*3/uL (ref 0.0–0.1)
Basophils Relative: 1 %
Eosinophils Absolute: 0.1 10*3/uL (ref 0.0–0.5)
Eosinophils Relative: 3 %
HCT: 38.2 % (ref 36.0–46.0)
Hemoglobin: 12.6 g/dL (ref 12.0–15.0)
Immature Granulocytes: 0 %
Lymphocytes Relative: 18 %
Lymphs Abs: 0.8 10*3/uL (ref 0.7–4.0)
MCH: 28.4 pg (ref 26.0–34.0)
MCHC: 33 g/dL (ref 30.0–36.0)
MCV: 86.2 fL (ref 80.0–100.0)
Monocytes Absolute: 0.4 10*3/uL (ref 0.1–1.0)
Monocytes Relative: 10 %
Neutro Abs: 2.9 10*3/uL (ref 1.7–7.7)
Neutrophils Relative %: 68 %
Platelets: 223 10*3/uL (ref 150–400)
RBC: 4.43 MIL/uL (ref 3.87–5.11)
RDW: 13.8 % (ref 11.5–15.5)
WBC: 4.2 10*3/uL (ref 4.0–10.5)
nRBC: 0 % (ref 0.0–0.2)

## 2019-12-16 LAB — COMPREHENSIVE METABOLIC PANEL
ALT: 10 U/L (ref 0–44)
AST: 13 U/L — ABNORMAL LOW (ref 15–41)
Albumin: 3.2 g/dL — ABNORMAL LOW (ref 3.5–5.0)
Alkaline Phosphatase: 94 U/L (ref 38–126)
Anion gap: 10 (ref 5–15)
BUN: 8 mg/dL (ref 8–23)
CO2: 24 mmol/L (ref 22–32)
Calcium: 9 mg/dL (ref 8.9–10.3)
Chloride: 105 mmol/L (ref 98–111)
Creatinine, Ser: 0.85 mg/dL (ref 0.44–1.00)
GFR calc Af Amer: >60 mL/min (ref >60)
GFR calc non Af Amer: >60 mL/min (ref >60)
Glucose, Bld: 95 mg/dL (ref 70–99)
Potassium: 3.7 mmol/L (ref 3.5–5.1)
Sodium: 139 mmol/L (ref 135–145)
Total Bilirubin: 0.3 mg/dL (ref 0.3–1.2)
Total Protein: 6.5 g/dL (ref 6.5–8.1)

## 2019-12-16 MED ORDER — HEPARIN SOD (PORK) LOCK FLUSH 100 UNIT/ML IV SOLN
250.0000 [IU] | Freq: Once | INTRAVENOUS | Status: AC
  Administered 2019-12-16: 250 [IU]
  Filled 2019-12-16: qty 5

## 2019-12-16 MED ORDER — SODIUM CHLORIDE 0.9% FLUSH
10.0000 mL | Freq: Once | INTRAVENOUS | Status: AC
  Administered 2019-12-16: 10 mL
  Filled 2019-12-16: qty 10

## 2019-12-16 NOTE — Patient Instructions (Signed)

## 2019-12-16 NOTE — Assessment & Plan Note (Signed)
She is still quite debilitated from treatment  She needs to stay on current chemotherapy lifelong The patient is considered permanently disabled due to her disease and lifelong treatment and complications related to side effects of her chemotherapy 

## 2019-12-16 NOTE — Assessment & Plan Note (Signed)
Overall, she tolerated treatment well I felt that the diarrhea is unrelated to the side effects of treatment Her last imaging study in April showed excellent response to therapy I plan to lengthen the interval between imaging study, so I do not plan to repeat imaging study until October We will continue current treatment of Afinitor/letrozole We will get her port maintain with flushes and blood draw every 8 weeks and I will see her back for further follow-up

## 2019-12-16 NOTE — Progress Notes (Signed)
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[place]) Overall, she tolerated treatment well I felt that the diarrhea is unrelated to the side effects of treatment Her last imaging study in April showed excellent response to therapy I plan to lengthen the interval between imaging study, so I do not plan to repeat imaging study until October We will continue current treatment of Afinitor/letrozole We will get her port maintain with flushes and blood draw every 8 weeks and I will see her back for further follow-up  Diarrhea She has intermittent diarrhea that is not consistent with side effects of treatment It could be related to certain food items I recommend her to continue Pepto-Bismol or Imodium as needed  Lymphedema Lymphedema is stable Observe for now  Physical debility She is still quite debilitated from treatment  She needs to stay on current chemotherapy lifelong The patient is considered permanently disabled due to her disease and lifelong treatment and complications related to side effects of her chemotherapy   No orders of the defined types were placed in this encounter.   All questions were answered. The patient knows to call the clinic with any problems, questions or concerns. The total time spent in the appointment was 20 minutes encounter with patients including review of chart and various tests results, discussions about plan of care and coordination of care plan   [name], MD 12/16/2019 11:19 AM  INTERVAL HISTORY: Please see below for problem oriented charting. 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  The patient's initial surgery was with Dr. Marti Sleigh in [place] on January 05, 2014.  It included an exploratory laparotomy with TAH, BSO, appendectomy, pelvic and para-aortic lymphadenectomy and wedge resection of a concerning appearing liver lesion.  Preoperatively she presented with a 26 cm right adnexal mass and an elevated Ca1 25 at 404 units/mL.  At the completion of surgery there was no gross residual disease.  Final pathology showed a primary grade 3 endometrial adenocarcinoma (endometrioid) involving both ovaries.  Lymph nodes and other biopsies were negative.  She had uncomplicated postoperative course.  She was treated with 6 cycles of carboplatin paclitaxel chemotherapy.  She was recommended adjuvant radiation therapy however declined this after counseling.  Following her initial therapy she developed severe left lower extremity lymphedema.   Follow-up CT scan in March 2016 showed no evidence of disease.  She was followed every 3 months for a total of 5 years with no recurrence appreciated during that time   03/28/2019 Imaging   Ct abdomen and pelvis 1. Enlarged right inguinal lymph node or soft tissue mass, measuring 3.8 x 3.5 cm (series 2, image 69). Additional enlarged right iliac lymph node measuring 1.9 x 1.1 cm (series 2, image 68). Findings are concerning for  malignancy.   2. Status post hysterectomy per stated history of endometrial malignancy.   3. No other findings in the abdomen or pelvis concerning for malignancy or metastatic disease.    4. There are multiple subcentimeter low-attenuation lesions of the liver, the largest in the left lobe the measuring 7 mm, too small to characterize (series 2, image 24). Attention on follow-up although these are most likely incidental benign small cysts and/or hemangiomata.   5.  Aortic atherosclerosis.   04/10/2019 Pathology Results   A. LYMPH NODE, RIGHT INGUINAL, NEEDLE CORE BIOPSY:  - Metastatic adenocarcinoma.  - See comment.   COMMENT:   The morphology is most consistent with metastatic endometrioid adenocarcinoma.  Immunohistochemistry can be performed if clinically indicated.  There is sufficient tissue for additional testing   04/10/2019 Procedure   Technically successful ultrasound guided biopsy of dominant right inguinal lymph node   04/22/2019 Cancer Staging   Staging form: Corpus Uteri - Carcinoma and Carcinosarcoma, AJCC 8th Edition - Pathologic stage from 04/22/2019: FIGO Stage IVB (rpT3a, pN0, pM1) - Signed by [name], MD on 04/22/2019   04/22/2019 PET scan   1. Two hypermetabolic RIGHT iliac lymph nodes and larger metastatic RIGHT inguinal lymph node. 2. No evidence of metastatic disease outside the pelvis. 3. No clear evidence of activity at the vaginal cuff.   05/15/2019 Procedure   Placement of a subcutaneous port device. Catheter tip in the lower SVC   05/16/2019 - 06/12/2019 Chemotherapy   The patient had cisplatin for chemotherapy treatment.      Genetic Testing   Patient has genetic testing done for MSI. Results revealed patient has the following: MSI Stable   06/23/2019 Imaging   1. Interval decreased size of right inguinal and external iliac lymph nodes. 2. No evidence of progressive metastatic disease. 3. Stable small low-density hepatic lesions, likely cysts. 4. Aortic Atherosclerosis (ICD10-I70.0).     07/14/2019 -  Chemotherapy   The patient had everolimus and letrozole for chemotherapy treatment.     10/20/2019 Imaging   1. No acute  abdominal/pelvic findings, mass lesions or adenopathy. 2. Interval resolution of right iliac lymph node. No residual measurable lymph node is identified. Decrease in size of the right inguinal node. 3. Stable scattered hepatic cysts.   Aortic Atherosclerosis (ICD10-I70.0).     REVIEW OF SYSTEMS:   Constitutional: Denies fevers, chills or abnormal weight loss Eyes: Denies blurriness of vision Ears, nose, mouth, throat, and face: Denies mucositis or sore throat Respiratory: Denies cough, dyspnea or wheezes Skin: Denies abnormal skin rashes Lymphatics: Denies new lymphadenopathy or easy bruising Neurological:Denies numbness, tingling or new weaknesses Behavioral/Psych: Mood is stable, no new changes  All other systems were reviewed with the patient and are negative.  I have reviewed the past medical history, past surgical history, social history and family history with the patient and they are unchanged from previous note.  ALLERGIES:  is allergic to oxycodone; garlic; other; and penicillins.  MEDICATIONS:  Current Outpatient Medications  Medication Sig Dispense Refill  . amLODipine (NORVASC) 5 MG tablet Take 1 tablet (5 mg total) by mouth daily. 90 tablet 11  . everolimus (AFINITOR) 5 MG tablet Take 1 tablet (5 mg total) by mouth daily. 28 tablet 11  . gabapentin (NEURONTIN) 300 MG capsule Take 1 capsule (300 mg total) by mouth 2 (two) times daily. 60 capsule 11  . ibuprofen (ADVIL) 200 MG tablet Take 200 mg by mouth every 6 (six) hours as needed.    Marland Kitchen letrozole [facility])  2.5 MG tablet Take 1 tablet (2.5 mg total) by mouth daily. 90 tablet 3  . lidocaine-prilocaine (EMLA) cream Apply to affected area once 30 g 3  . magic mouthwash w/lidocaine SOLN Take 5 mLs by mouth 3 (three) times daily as needed for mouth pain. 145 mL 0  . ondansetron (ZOFRAN) 8 MG tablet Take 1 tablet (8 mg total) by mouth every 8 (eight) hours as needed. 30 tablet 1  . prochlorperazine (COMPAZINE) 10 MG tablet  Take 1 tablet (10 mg total) by mouth every 6 (six) hours as needed (Nausea or vomiting). 30 tablet 1  . traMADol (ULTRAM) 50 MG tablet TK 1 T PO  BID PRF PAINS.     No current facility-administered medications for this visit.    PHYSICAL EXAMINATION: ECOG PERFORMANCE STATUS: 1 - Symptomatic but completely ambulatory  Vitals:   12/16/19 1003  BP: (!) 148/76  Pulse: 66  Resp: 18  Temp: 98.5 F (36.9 C)  SpO2: 100%   Filed Weights   12/16/19 1003  Weight: 171 lb 11.2 oz (77.9 kg)    GENERAL:alert, no distress and comfortable HEART: noted stable lower extremity edema NEURO: alert & oriented x 3 with fluent speech, no focal motor/sensory deficits  LABORATORY DATA:  I have reviewed the data as listed    Component Value Date/Time   NA 139 12/16/2019 0930   K 3.7 12/16/2019 0930   CL 105 12/16/2019 0930   CO2 24 12/16/2019 0930   GLUCOSE 95 12/16/2019 0930   BUN 8 12/16/2019 0930   CREATININE 0.85 12/16/2019 0930   CALCIUM 9.0 12/16/2019 0930   PROT 6.5 12/16/2019 0930   ALBUMIN 3.2 (L) 12/16/2019 0930   AST 13 (L) 12/16/2019 0930   ALT 10 12/16/2019 0930   ALKPHOS 94 12/16/2019 0930   BILITOT 0.3 12/16/2019 0930   GFRNONAA >60 12/16/2019 0930   GFRAA >60 12/16/2019 0930    No results found for: SPEP, UPEP  Lab Results  Component Value Date   WBC 4.2 12/16/2019   NEUTROABS 2.9 12/16/2019   HGB 12.6 12/16/2019   HCT 38.2 12/16/2019   MCV 86.2 12/16/2019   PLT 223 12/16/2019      Chemistry      Component Value Date/Time   NA 139 12/16/2019 0930   K 3.7 12/16/2019 0930   CL 105 12/16/2019 0930   CO2 24 12/16/2019 0930   BUN 8 12/16/2019 0930   CREATININE 0.85 12/16/2019 0930      Component Value Date/Time   CALCIUM 9.0 12/16/2019 0930   ALKPHOS 94 12/16/2019 0930   AST 13 (L) 12/16/2019 0930   ALT 10 12/16/2019 0930   BILITOT 0.3 12/16/2019 0930

## 2019-12-16 NOTE — Assessment & Plan Note (Signed)
Lymphedema is stable Observe for now 

## 2019-12-16 NOTE — Telephone Encounter (Signed)
Scheduled appts per 6/1 sch msg. Pt confirmed appt date and time.  

## 2019-12-16 NOTE — Assessment & Plan Note (Signed)
She has intermittent diarrhea that is not consistent with side effects of treatment It could be related to certain food items I recommend her to continue Pepto-Bismol or Imodium as needed 

## 2020-02-10 ENCOUNTER — Encounter: Payer: Self-pay | Admitting: Hematology and Oncology

## 2020-02-10 ENCOUNTER — Inpatient Hospital Stay: Payer: Medicare HMO

## 2020-02-10 ENCOUNTER — Telehealth: Payer: Self-pay | Admitting: Hematology and Oncology

## 2020-02-10 ENCOUNTER — Other Ambulatory Visit: Payer: Self-pay

## 2020-02-10 ENCOUNTER — Inpatient Hospital Stay: Payer: Medicare HMO | Attending: Gynecologic Oncology | Admitting: Hematology and Oncology

## 2020-02-10 DIAGNOSIS — K1230 Oral mucositis (ulcerative), unspecified: Secondary | ICD-10-CM | POA: Diagnosis not present

## 2020-02-10 DIAGNOSIS — C55 Malignant neoplasm of uterus, part unspecified: Secondary | ICD-10-CM | POA: Diagnosis not present

## 2020-02-10 DIAGNOSIS — Z79899 Other long term (current) drug therapy: Secondary | ICD-10-CM | POA: Diagnosis not present

## 2020-02-10 DIAGNOSIS — I890 Lymphedema, not elsewhere classified: Secondary | ICD-10-CM | POA: Insufficient documentation

## 2020-02-10 DIAGNOSIS — C541 Malignant neoplasm of endometrium: Secondary | ICD-10-CM | POA: Insufficient documentation

## 2020-02-10 DIAGNOSIS — R5383 Other fatigue: Secondary | ICD-10-CM | POA: Diagnosis not present

## 2020-02-10 DIAGNOSIS — R5381 Other malaise: Secondary | ICD-10-CM | POA: Insufficient documentation

## 2020-02-10 DIAGNOSIS — M7989 Other specified soft tissue disorders: Secondary | ICD-10-CM | POA: Insufficient documentation

## 2020-02-10 DIAGNOSIS — R197 Diarrhea, unspecified: Secondary | ICD-10-CM | POA: Diagnosis not present

## 2020-02-10 LAB — CBC WITH DIFFERENTIAL/PLATELET
Abs Immature Granulocytes: 0.01 10*3/uL (ref 0.00–0.07)
Basophils Absolute: 0 10*3/uL (ref 0.0–0.1)
Basophils Relative: 0 %
Eosinophils Absolute: 0.1 10*3/uL (ref 0.0–0.5)
Eosinophils Relative: 3 %
HCT: 37.6 % (ref 36.0–46.0)
Hemoglobin: 12.5 g/dL (ref 12.0–15.0)
Immature Granulocytes: 0 %
Lymphocytes Relative: 20 %
Lymphs Abs: 0.7 10*3/uL (ref 0.7–4.0)
MCH: 27.8 pg (ref 26.0–34.0)
MCHC: 33.2 g/dL (ref 30.0–36.0)
MCV: 83.7 fL (ref 80.0–100.0)
Monocytes Absolute: 0.4 10*3/uL (ref 0.1–1.0)
Monocytes Relative: 11 %
Neutro Abs: 2.4 10*3/uL (ref 1.7–7.7)
Neutrophils Relative %: 66 %
Platelets: 223 10*3/uL (ref 150–400)
RBC: 4.49 MIL/uL (ref 3.87–5.11)
RDW: 14.1 % (ref 11.5–15.5)
WBC: 3.6 10*3/uL — ABNORMAL LOW (ref 4.0–10.5)
nRBC: 0 % (ref 0.0–0.2)

## 2020-02-10 LAB — COMPREHENSIVE METABOLIC PANEL
ALT: 13 U/L (ref 0–44)
AST: 16 U/L (ref 15–41)
Albumin: 3.4 g/dL — ABNORMAL LOW (ref 3.5–5.0)
Alkaline Phosphatase: 102 U/L (ref 38–126)
Anion gap: 9 (ref 5–15)
BUN: 8 mg/dL (ref 8–23)
CO2: 23 mmol/L (ref 22–32)
Calcium: 9.6 mg/dL (ref 8.9–10.3)
Chloride: 106 mmol/L (ref 98–111)
Creatinine, Ser: 0.82 mg/dL (ref 0.44–1.00)
GFR calc Af Amer: >60 mL/min (ref >60)
GFR calc non Af Amer: >60 mL/min (ref >60)
Glucose, Bld: 90 mg/dL (ref 70–99)
Potassium: 3.9 mmol/L (ref 3.5–5.1)
Sodium: 138 mmol/L (ref 135–145)
Total Bilirubin: 0.5 mg/dL (ref 0.3–1.2)
Total Protein: 6.7 g/dL (ref 6.5–8.1)

## 2020-02-10 MED ORDER — HEPARIN SOD (PORK) LOCK FLUSH 100 UNIT/ML IV SOLN
500.0000 [IU] | Freq: Once | INTRAVENOUS | Status: AC
  Administered 2020-02-10: 500 [IU]
  Filled 2020-02-10: qty 5

## 2020-02-10 MED ORDER — SODIUM CHLORIDE 0.9% FLUSH
10.0000 mL | Freq: Once | INTRAVENOUS | Status: AC
  Administered 2020-02-10: 10 mL
  Filled 2020-02-10: qty 10

## 2020-02-10 NOTE — Assessment & Plan Note (Signed)
She continues to have intermittent diarrhea, mucositis with sores in her mouth or nose, excessive fatigue, leg swelling and profound fatigue Her last imaging study in April showed excellent response to therapy I plan to lengthen the interval between imaging study, so I do not plan to repeat imaging study until October We will continue current treatment of Afinitor/letrozole We will get her port maintain with flushes and blood draw every 8 weeks and I will see her back for further follow-up

## 2020-02-10 NOTE — Telephone Encounter (Signed)
Scheduled appts per 7/27 sch msg. Pt confirmed appt date and time.

## 2020-02-10 NOTE — Assessment & Plan Note (Signed)
She has intermittent diarrhea that is not consistent with side effects of treatment It could be related to certain food items I recommend her to continue Pepto-Bismol or Imodium as needed

## 2020-02-10 NOTE — Assessment & Plan Note (Signed)
She complained of fatigue while on treatment Certainly, it could be due to side effects of chemotherapy Recent thyroid function test in January was within normal limits

## 2020-02-10 NOTE — Assessment & Plan Note (Signed)
Lymphedema is stable Observe for now 

## 2020-02-10 NOTE — Progress Notes (Signed)
[place] OFFICE PROGRESS NOTE  Patient Care Team: Shon Baton, MD as PCP - General (Internal Medicine) Awanda Mink Craige Cotta, RN as Oncology Nurse Navigator (Oncology)  ASSESSMENT & PLAN:  Endometrioid adenocarcinoma of uterus [facility]) She continues to have intermittent diarrhea, mucositis with sores in her mouth or nose, excessive fatigue, leg swelling and profound fatigue Her last imaging study in April showed excellent response to therapy I plan to lengthen the interval between imaging study, so I do not plan to repeat imaging study until October We will continue current treatment of Afinitor/letrozole We will get her port maintain with flushes and blood draw every 8 weeks and I will see her back for further follow-up  Lymphedema Lymphedema is stable Observe for now  Diarrhea She has intermittent diarrhea that is not consistent with side effects of treatment It could be related to certain food items I recommend her to continue Pepto-Bismol or Imodium as needed  Other fatigue She complained of fatigue while on treatment Certainly, it could be due to side effects of chemotherapy Recent thyroid function test in January was within normal limits  Physical debility She is still quite debilitated from treatment  She needs to stay on current chemotherapy lifelong The patient is considered permanently disabled due to her disease and lifelong treatment and complications related to side effects of her chemotherapy   No orders of the defined types were placed in this encounter.   All questions were answered. The patient knows to call the clinic with any problems, questions or concerns. The total time spent in the appointment was 20 minutes encounter with patients including review of chart and various tests results, discussions about plan of care and coordination of care plan   [name], MD 02/10/2020 11:06 AM  INTERVAL HISTORY: Please see below for problem oriented  charting. 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  The patient's initial surgery was with Dr. Marti Sleigh in [place] on January 05, 2014.  It included an exploratory laparotomy with TAH, BSO, appendectomy, pelvic and para-aortic lymphadenectomy and wedge resection of a concerning appearing liver lesion.  Preoperatively she presented with a 26 cm right adnexal mass and an elevated Ca1 25 at 404 units/mL.  At the completion of surgery there was no gross residual disease.  Final pathology showed a primary grade 3 endometrial adenocarcinoma (endometrioid) involving both ovaries.  Lymph nodes and other biopsies were negative.  She had uncomplicated postoperative course.  She was treated with 6 cycles of carboplatin paclitaxel chemotherapy.  She was recommended adjuvant radiation therapy however declined this after counseling.  Following her initial therapy she developed severe left lower extremity lymphedema.   Follow-up CT scan in March 2016 showed no evidence of disease.  She was followed every 3 months for a total of 5 years with no recurrence appreciated during that time   03/28/2019 Imaging   Ct abdomen and pelvis 1. Enlarged right inguinal lymph node or soft tissue mass, measuring 3.8 x 3.5 cm (series  2, image 69). Additional enlarged right iliac lymph node measuring 1.9 x 1.1 cm (series 2, image 68). Findings are concerning for malignancy.   2. Status post  hysterectomy per stated history of endometrial malignancy.   3. No other findings in the abdomen or pelvis concerning for malignancy or metastatic disease.   4. There are multiple subcentimeter low-attenuation lesions of the liver, the largest in the left lobe the measuring 7 mm, too small to characterize (series 2, image 24). Attention on follow-up although these are most likely incidental benign small cysts and/or hemangiomata.   5.  Aortic atherosclerosis.   04/10/2019 Pathology Results   A. LYMPH NODE, RIGHT INGUINAL, NEEDLE CORE BIOPSY:  - Metastatic adenocarcinoma.  - See comment.   COMMENT:   The morphology is most consistent with metastatic endometrioid adenocarcinoma.  Immunohistochemistry can be performed if clinically indicated.  There is sufficient tissue for additional testing   04/10/2019 Procedure   Technically successful ultrasound guided biopsy of dominant right inguinal lymph node   04/22/2019 Cancer Staging   Staging form: Corpus Uteri - Carcinoma and Carcinosarcoma, AJCC 8th Edition - Pathologic stage from 04/22/2019: FIGO Stage IVB (rpT3a, pN0, pM1) - Signed by [name], MD on 04/22/2019   04/22/2019 PET scan   1. Two hypermetabolic RIGHT iliac lymph nodes and larger metastatic RIGHT inguinal lymph node. 2. No evidence of metastatic disease outside the pelvis. 3. No clear evidence of activity at the vaginal cuff.   05/15/2019 Procedure   Placement of a subcutaneous port device. Catheter tip in the lower SVC   05/16/2019 - 06/12/2019 Chemotherapy   The patient had cisplatin for chemotherapy treatment.      Genetic Testing   Patient has genetic testing done for MSI. Results revealed patient has the following: MSI Stable   06/23/2019 Imaging   1. Interval decreased size of right inguinal and external iliac lymph nodes. 2. No evidence of progressive metastatic disease. 3. Stable small low-density hepatic lesions, likely cysts. 4. Aortic Atherosclerosis  (ICD10-I70.0).     07/14/2019 -  Chemotherapy   The patient had everolimus and letrozole for chemotherapy treatment.     10/20/2019 Imaging   1. No acute abdominal/pelvic findings, mass lesions or adenopathy. 2. Interval resolution of right iliac lymph node. No residual measurable lymph node is identified. Decrease in size of the right inguinal node. 3. Stable scattered hepatic cysts.   Aortic Atherosclerosis (ICD10-I70.0).     REVIEW OF SYSTEMS:   Constitutional: Denies fevers, chills or abnormal weight loss Eyes: Denies blurriness of vision Ears, nose, mouth, throat, and face: Denies mucositis or sore throat Respiratory: Denies cough, dyspnea or wheezes Cardiovascular: Denies palpitation, chest discomfort  Skin: Denies abnormal skin rashes Lymphatics: Denies new lymphadenopathy or easy bruising Neurological:Denies numbness, tingling or new weaknesses Behavioral/Psych: Mood is stable, no new changes  All other systems were reviewed with the patient and are negative.  I have reviewed the past medical history, past surgical history, social history and family history with the patient and they are unchanged from previous note.  ALLERGIES:  is allergic to oxycodone, garlic, other, and penicillins.  MEDICATIONS:  Current Outpatient Medications  Medication Sig Dispense Refill  . amLODipine (NORVASC) 5 MG tablet Take 1 tablet (5 mg total) by mouth daily. 90 tablet 11  . everolimus (AFINITOR) 5 MG tablet Take 1 tablet (5 mg total) by mouth daily. 28 tablet 11  . gabapentin (NEURONTIN) 300 MG capsule Take 1 capsule (300 mg total) by mouth 2 (two) times  daily. 60 capsule 11  . ibuprofen (ADVIL) 200 MG tablet Take 200 mg by mouth every 6 (six) hours as needed.    Marland Kitchen letrozole (FEMARA) 2.5 MG tablet Take 1 tablet (2.5 mg total) by mouth daily. 90 tablet 3  . lidocaine-prilocaine (EMLA) cream Apply to affected area once 30 g 3  . magic mouthwash w/lidocaine SOLN Take 5 mLs by mouth 3 (three)  times daily as needed for mouth pain. 145 mL 0  . ondansetron (ZOFRAN) 8 MG tablet Take 1 tablet (8 mg total) by mouth every 8 (eight) hours as needed. 30 tablet 1  . prochlorperazine (COMPAZINE) 10 MG tablet Take 1 tablet (10 mg total) by mouth every 6 (six) hours as needed (Nausea or vomiting). 30 tablet 1  . traMADol (ULTRAM) 50 MG tablet TK 1 T PO  BID PRF PAINS.     No current facility-administered medications for this visit.    PHYSICAL EXAMINATION: ECOG PERFORMANCE STATUS: 1 - Symptomatic but completely ambulatory  Vitals:   02/10/20 0935  BP: (!) 140/74  Pulse: 70  Resp: 18  Temp: 97.9 F (36.6 C)  SpO2: 100%   Filed Weights   02/10/20 0935  Weight: 171 lb (77.6 kg)    GENERAL:alert, no distress and comfortable SKIN: skin color, texture, turgor are normal, no rashes or significant lesions EYES: normal, Conjunctiva are pink and non-injected, sclera clear OROPHARYNX:no exudate, no erythema and lips, buccal mucosa, and tongue normal  NECK: supple, thyroid normal size, non-tender, without nodularity LYMPH:  no palpable lymphadenopathy in the cervical, axillary or inguinal LUNGS: clear to auscultation and percussion with normal breathing effort HEART: regular rate & rhythm and no murmurs with stable bilateral lower extremity edema ABDOMEN:abdomen soft, non-tender and normal bowel sounds Musculoskeletal:no cyanosis of digits and no clubbing  NEURO: alert & oriented x 3 with fluent speech, no focal motor/sensory deficits  LABORATORY DATA:  I have reviewed the data as listed    Component Value Date/Time   NA 138 02/10/2020 0902   K 3.9 02/10/2020 0902   CL 106 02/10/2020 0902   CO2 23 02/10/2020 0902   GLUCOSE 90 02/10/2020 0902   BUN 8 02/10/2020 0902   CREATININE 0.82 02/10/2020 0902   CALCIUM 9.6 02/10/2020 0902   PROT 6.7 02/10/2020 0902   ALBUMIN 3.4 (L) 02/10/2020 0902   AST 16 02/10/2020 0902   ALT 13 02/10/2020 0902   ALKPHOS 102 02/10/2020 0902   BILITOT  0.5 02/10/2020 0902   GFRNONAA >60 02/10/2020 0902   GFRAA >60 02/10/2020 0902    No results found for: SPEP, UPEP  Lab Results  Component Value Date   WBC 3.6 (L) 02/10/2020   NEUTROABS 2.4 02/10/2020   HGB 12.5 02/10/2020   HCT 37.6 02/10/2020   MCV 83.7 02/10/2020   PLT 223 02/10/2020      Chemistry      Component Value Date/Time   NA 138 02/10/2020 0902   K 3.9 02/10/2020 0902   CL 106 02/10/2020 0902   CO2 23 02/10/2020 0902   BUN 8 02/10/2020 0902   CREATININE 0.82 02/10/2020 0902      Component Value Date/Time   CALCIUM 9.6 02/10/2020 0902   ALKPHOS 102 02/10/2020 0902   AST 16 02/10/2020 0902   ALT 13 02/10/2020 0902   BILITOT 0.5 02/10/2020 0902

## 2020-02-10 NOTE — Assessment & Plan Note (Signed)
She is still quite debilitated from treatment  She needs to stay on current chemotherapy lifelong The patient is considered permanently disabled due to her disease and lifelong treatment and complications related to side effects of her chemotherapy

## 2020-02-27 ENCOUNTER — Other Ambulatory Visit: Payer: Self-pay | Admitting: Hematology and Oncology

## 2020-02-27 ENCOUNTER — Telehealth: Payer: Self-pay

## 2020-02-27 DIAGNOSIS — C55 Malignant neoplasm of uterus, part unspecified: Secondary | ICD-10-CM

## 2020-02-27 DIAGNOSIS — R197 Diarrhea, unspecified: Secondary | ICD-10-CM

## 2020-02-27 NOTE — Telephone Encounter (Signed)
She called and left a message to call her.  Called back. She has been having liquid diarrhea since Tuesday. She is not sure if it is a virus or maybe food poison. She has taken pepto bismol, then  imodium and now she is taking Lomotil, she had old Rx. If she eats food it goes straight thru her. She is able to drink lots of liquids and is drinking a lot of Gatorade. Offered appt today with Sandi Mealy, PA or IV fluids. She declined offer of appt today. She does think she made need appt on Monday with Dr. Alvy Bimler. She has not taken Afinitor since earlier this week and unable to exercise. Okay to schedule?

## 2020-02-27 NOTE — Telephone Encounter (Signed)
Called and given below message. She verbalzied understanding. She is aware of appt times.

## 2020-02-27 NOTE — Telephone Encounter (Signed)
Yes I can see her at 9 am Pls schedule labs and see me 20 mins Hold chemo until I see her

## 2020-03-01 ENCOUNTER — Inpatient Hospital Stay: Payer: Medicare HMO

## 2020-03-01 ENCOUNTER — Encounter: Payer: Self-pay | Admitting: Hematology and Oncology

## 2020-03-01 ENCOUNTER — Other Ambulatory Visit: Payer: Medicare HMO

## 2020-03-01 ENCOUNTER — Inpatient Hospital Stay: Payer: Medicare HMO | Attending: Gynecologic Oncology | Admitting: Hematology and Oncology

## 2020-03-01 ENCOUNTER — Other Ambulatory Visit: Payer: Self-pay

## 2020-03-01 VITALS — BP 125/76 | HR 64 | Temp 97.2°F | Resp 18 | Ht 61.0 in | Wt 165.3 lb

## 2020-03-01 DIAGNOSIS — I700 Atherosclerosis of aorta: Secondary | ICD-10-CM | POA: Diagnosis not present

## 2020-03-01 DIAGNOSIS — C774 Secondary and unspecified malignant neoplasm of inguinal and lower limb lymph nodes: Secondary | ICD-10-CM | POA: Diagnosis not present

## 2020-03-01 DIAGNOSIS — Z23 Encounter for immunization: Secondary | ICD-10-CM | POA: Insufficient documentation

## 2020-03-01 DIAGNOSIS — D701 Agranulocytosis secondary to cancer chemotherapy: Secondary | ICD-10-CM | POA: Insufficient documentation

## 2020-03-01 DIAGNOSIS — K1231 Oral mucositis (ulcerative) due to antineoplastic therapy: Secondary | ICD-10-CM | POA: Diagnosis not present

## 2020-03-01 DIAGNOSIS — C541 Malignant neoplasm of endometrium: Secondary | ICD-10-CM | POA: Insufficient documentation

## 2020-03-01 DIAGNOSIS — T451X5A Adverse effect of antineoplastic and immunosuppressive drugs, initial encounter: Secondary | ICD-10-CM | POA: Diagnosis not present

## 2020-03-01 DIAGNOSIS — C55 Malignant neoplasm of uterus, part unspecified: Secondary | ICD-10-CM | POA: Diagnosis not present

## 2020-03-01 DIAGNOSIS — Z79899 Other long term (current) drug therapy: Secondary | ICD-10-CM | POA: Diagnosis not present

## 2020-03-01 DIAGNOSIS — R197 Diarrhea, unspecified: Secondary | ICD-10-CM

## 2020-03-01 LAB — CBC WITH DIFFERENTIAL/PLATELET
Abs Immature Granulocytes: 0.01 10*3/uL (ref 0.00–0.07)
Basophils Absolute: 0 10*3/uL (ref 0.0–0.1)
Basophils Relative: 1 %
Eosinophils Absolute: 0.1 10*3/uL (ref 0.0–0.5)
Eosinophils Relative: 2 %
HCT: 38 % (ref 36.0–46.0)
Hemoglobin: 12.5 g/dL (ref 12.0–15.0)
Immature Granulocytes: 0 %
Lymphocytes Relative: 22 %
Lymphs Abs: 0.8 10*3/uL (ref 0.7–4.0)
MCH: 28.2 pg (ref 26.0–34.0)
MCHC: 32.9 g/dL (ref 30.0–36.0)
MCV: 85.6 fL (ref 80.0–100.0)
Monocytes Absolute: 0.4 10*3/uL (ref 0.1–1.0)
Monocytes Relative: 10 %
Neutro Abs: 2.3 10*3/uL (ref 1.7–7.7)
Neutrophils Relative %: 65 %
Platelets: 248 10*3/uL (ref 150–400)
RBC: 4.44 MIL/uL (ref 3.87–5.11)
RDW: 14.2 % (ref 11.5–15.5)
WBC: 3.6 10*3/uL — ABNORMAL LOW (ref 4.0–10.5)
nRBC: 0 % (ref 0.0–0.2)

## 2020-03-01 LAB — COMPREHENSIVE METABOLIC PANEL
ALT: 7 U/L (ref 0–44)
AST: 12 U/L — ABNORMAL LOW (ref 15–41)
Albumin: 3.3 g/dL — ABNORMAL LOW (ref 3.5–5.0)
Alkaline Phosphatase: 89 U/L (ref 38–126)
Anion gap: 9 (ref 5–15)
BUN: 4 mg/dL — ABNORMAL LOW (ref 8–23)
CO2: 23 mmol/L (ref 22–32)
Calcium: 9.6 mg/dL (ref 8.9–10.3)
Chloride: 106 mmol/L (ref 98–111)
Creatinine, Ser: 0.85 mg/dL (ref 0.44–1.00)
GFR calc Af Amer: >60 mL/min (ref >60)
GFR calc non Af Amer: >60 mL/min (ref >60)
Glucose, Bld: 85 mg/dL (ref 70–99)
Potassium: 4.1 mmol/L (ref 3.5–5.1)
Sodium: 138 mmol/L (ref 135–145)
Total Bilirubin: 0.5 mg/dL (ref 0.3–1.2)
Total Protein: 6.5 g/dL (ref 6.5–8.1)

## 2020-03-01 LAB — MAGNESIUM: Magnesium: 1.8 mg/dL (ref 1.7–2.4)

## 2020-03-01 MED ORDER — DIPHENOXYLATE-ATROPINE 2.5-0.025 MG PO TABS
1.0000 | ORAL_TABLET | Freq: Four times a day (QID) | ORAL | 0 refills | Status: DC | PRN
Start: 2020-03-01 — End: 2020-03-09

## 2020-03-01 NOTE — Assessment & Plan Note (Signed)
She has severe, persistent recurrent diarrhea despite stopping treatment I recommend hydration as tolerated She will take scheduled Imodium as well as Lomotil as needed

## 2020-03-01 NOTE — Assessment & Plan Note (Signed)
I am concerned about her unresolved diarrhea I recommend her to continue to hold treatment I plan to order CT imaging for further evaluation

## 2020-03-01 NOTE — Assessment & Plan Note (Signed)
This is likely due to recent treatment. The patient denies recent history of fevers, cough, chills, diarrhea or dysuria. She is asymptomatic from the leukopenia. I will observe for now.  I will continue the chemotherapy at current dose without dosage adjustment.  If the leukopenia gets progressive worse in the future, I might have to delay her treatment or adjust the chemotherapy dose.   

## 2020-03-01 NOTE — Assessment & Plan Note (Signed)
She does not have open sores I recommend conservative approach

## 2020-03-01 NOTE — Progress Notes (Signed)
[facility] OFFICE PROGRESS NOTE  Patient Care Team: Russo, John, MD as PCP - General (Internal Medicine) Hess, Karen R, RN as Oncology Nurse Navigator (Oncology)  ASSESSMENT & PLAN:  Endometrioid adenocarcinoma of uterus (HCC) I am concerned about her unresolved diarrhea I recommend her to continue to hold treatment I plan to order CT imaging for further evaluation  Diarrhea She has severe, persistent recurrent diarrhea despite stopping treatment I recommend hydration as tolerated She will take scheduled Imodium as well as Lomotil as needed  Leukopenia due to antineoplastic chemotherapy (HCC) This is likely due to recent treatment. The patient denies recent history of fevers, cough, chills, diarrhea or dysuria. She is asymptomatic from the leukopenia. I will observe for now.  I will continue the chemotherapy at current dose without dosage adjustment.  If the leukopenia gets progressive worse in the future, I might have to delay her treatment or adjust the chemotherapy dose.    Mucositis due to chemotherapy She does not have open sores I recommend conservative approach   Orders Placed This Encounter  Procedures  . CT ABDOMEN PELVIS W CONTRAST    Standing Status:   Future    Standing Expiration Date:   03/01/2021    Order Specific Question:   If indicated for the ordered procedure, I authorize the administration of contrast media per Radiology protocol    Answer:   Yes    Order Specific Question:   Preferred imaging location?    Answer:   [facility]    Order Specific Question:   Radiology Contrast Protocol - do NOT remove file path    Answer:   [email or web address]    All questions were answered. The patient knows to call the clinic with any problems, questions or concerns. The total time spent in the appointment was 25 minutes encounter with patients including review of chart and various tests results, discussions about plan of  care and coordination of care plan   Ni Gorsuch, MD 03/01/2020 10:29 AM  INTERVAL HISTORY: Please see below for problem oriented charting. She returns for urgent follow-up She is developing significant mucousy diarrhea, loose stool, crampy abdominal pain and intermittent mucositis affecting her nasal passages with occasional bleeding She denies fevers No recent rectal bleeding She has stopped her chemotherapy for almost a week with no resolution so far SUMMARY OF ONCOLOGIC HISTORY: Oncology History Overview Note  ER 90%, PR 95% PD-L1 score 1% MSI Stable   Endometrioid adenocarcinoma of uterus (HCC)  01/05/2014 Initial Diagnosis   The patient has a history of a 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  The patient's initial surgery was with Dr. Daniel Clarke Pearson in [place] on January 05, 2014.  It included an exploratory laparotomy with TAH, BSO, appendectomy, pelvic and para-aortic lymphadenectomy and wedge resection of a concerning appearing liver lesion.  Preoperatively she presented with a 26 cm right adnexal mass and an elevated Ca1 25 at 404 units/mL.  At the completion of surgery there was no gross residual disease.  Final pathology showed a primary grade 3 endometrial adenocarcinoma (endometrioid) involving both ovaries.  Lymph nodes and other biopsies were negative.  She had uncomplicated postoperative course.  She was treated with 6 cycles of carboplatin paclitaxel chemotherapy.  She was recommended adjuvant radiation therapy however declined this after counseling.  Following her initial therapy she developed severe left lower extremity lymphedema.   Follow-up CT scan in March 2016 showed no evidence   of disease.  She was followed every 3 months for a total of 5 years with no recurrence appreciated during that time   03/28/2019 Imaging   Ct abdomen and pelvis 1. Enlarged right inguinal lymph node or soft tissue mass, measuring  3.8 x 3.5 cm (series 2, image 69). Additional enlarged right iliac lymph node measuring 1.9 x 1.1 cm (series 2, image 68). Findings are concerning for malignancy.   2. Status post hysterectomy per stated history of endometrial malignancy.   3. No other findings in the abdomen or pelvis concerning for malignancy or metastatic disease.   4. There are multiple subcentimeter low-attenuation lesions of the liver, the largest in the left lobe the measuring 7 mm, too small to characterize (series 2, image 24). Attention on follow-up although these are most likely incidental benign small cysts and/or hemangiomata.   5.  Aortic atherosclerosis.   04/10/2019 Pathology Results   A. LYMPH NODE, RIGHT INGUINAL, NEEDLE CORE BIOPSY:  - Metastatic adenocarcinoma.  - See comment.   COMMENT:   The morphology is most consistent with metastatic endometrioid adenocarcinoma.  Immunohistochemistry can be performed if clinically indicated.  There is sufficient tissue for additional testing   04/10/2019 Procedure   Technically successful ultrasound guided biopsy of dominant right inguinal lymph node   04/22/2019 Cancer Staging   Staging form: Corpus Uteri - Carcinoma and Carcinosarcoma, AJCC 8th Edition - Pathologic stage from 04/22/2019: FIGO Stage IVB (rpT3a, pN0, pM1) - Signed by Gorsuch, Ni, MD on 04/22/2019   04/22/2019 PET scan   1. Two hypermetabolic RIGHT iliac lymph nodes and larger metastatic RIGHT inguinal lymph node. 2. No evidence of metastatic disease outside the pelvis. 3. No clear evidence of activity at the vaginal cuff.   05/15/2019 Procedure   Placement of a subcutaneous port device. Catheter tip in the lower SVC   05/16/2019 - 06/12/2019 Chemotherapy   The patient had cisplatin for chemotherapy treatment.      Genetic Testing   Patient has genetic testing done for MSI. Results revealed patient has the following: MSI Stable   06/23/2019 Imaging   1. Interval decreased size of right  inguinal and external iliac lymph nodes. 2. No evidence of progressive metastatic disease. 3. Stable small low-density hepatic lesions, likely cysts. 4. Aortic Atherosclerosis (ICD10-I70.0).     07/14/2019 -  Chemotherapy   The patient had everolimus and letrozole for chemotherapy treatment.     10/20/2019 Imaging   1. No acute abdominal/pelvic findings, mass lesions or adenopathy. 2. Interval resolution of right iliac lymph node. No residual measurable lymph node is identified. Decrease in size of the right inguinal node. 3. Stable scattered hepatic cysts.   Aortic Atherosclerosis (ICD10-I70.0).     REVIEW OF SYSTEMS:   Constitutional: Denies fevers, chills or abnormal weight loss Eyes: Denies blurriness of vision Respiratory: Denies cough, dyspnea or wheezes Cardiovascular: Denies palpitation, chest discomfort or lower extremity swelling Skin: Denies abnormal skin rashes Lymphatics: Denies new lymphadenopathy or easy bruising Neurological:Denies numbness, tingling or new weaknesses Behavioral/Psych: Mood is stable, no new changes  All other systems were reviewed with the patient and are negative.  I have reviewed the past medical history, past surgical history, social history and family history with the patient and they are unchanged from previous note.  ALLERGIES:  is allergic to oxycodone, garlic, other, and penicillins.  MEDICATIONS:  Current Outpatient Medications  Medication Sig Dispense Refill  . loperamide (IMODIUM A-D) 2 MG tablet Take 4 mg by mouth 4 (four) times   daily as needed.    . amLODipine (NORVASC) 5 MG tablet Take 1 tablet (5 mg total) by mouth daily. 90 tablet 11  . diphenoxylate-atropine (LOMOTIL) 2.5-0.025 MG tablet Take 1 tablet by mouth 4 (four) times daily as needed for diarrhea or loose stools. 30 tablet 0  . everolimus (AFINITOR) 5 MG tablet Take 1 tablet (5 mg total) by mouth daily. 28 tablet 11  . gabapentin (NEURONTIN) 300 MG capsule Take 1 capsule  (300 mg total) by mouth 2 (two) times daily. 60 capsule 11  . ibuprofen (ADVIL) 200 MG tablet Take 200 mg by mouth every 6 (six) hours as needed.    . letrozole (FEMARA) 2.5 MG tablet Take 1 tablet (2.5 mg total) by mouth daily. 90 tablet 3  . lidocaine-prilocaine (EMLA) cream Apply to affected area once 30 g 3  . ondansetron (ZOFRAN) 8 MG tablet Take 1 tablet (8 mg total) by mouth every 8 (eight) hours as needed. 30 tablet 1  . prochlorperazine (COMPAZINE) 10 MG tablet Take 1 tablet (10 mg total) by mouth every 6 (six) hours as needed (Nausea or vomiting). 30 tablet 1  . traMADol (ULTRAM) 50 MG tablet TK 1 T PO  BID PRF PAINS.     No current facility-administered medications for this visit.    PHYSICAL EXAMINATION: ECOG PERFORMANCE STATUS: 1 - Symptomatic but completely ambulatory  Vitals:   03/01/20 0840  BP: 125/76  Pulse: 64  Resp: 18  Temp: (!) 97.2 F (36.2 C)  SpO2: 99%   Filed Weights   03/01/20 0840  Weight: 165 lb 4.8 oz (75 kg)    GENERAL:alert, no distress and comfortable SKIN: skin color, texture, turgor are normal, no rashes or significant lesions EYES: normal, Conjunctiva are pink and non-injected, sclera clear OROPHARYNX:no exudate, no erythema and lips, buccal mucosa, and tongue normal  NECK: supple, thyroid normal size, non-tender, without nodularity LYMPH:  no palpable lymphadenopathy in the cervical, axillary or inguinal LUNGS: clear to auscultation and percussion with normal breathing effort HEART: regular rate & rhythm and no murmurs and no lower extremity edema ABDOMEN:abdomen soft, non-tender and normal bowel sounds Musculoskeletal:no cyanosis of digits and no clubbing  NEURO: alert & oriented x 3 with fluent speech, no focal motor/sensory deficits  LABORATORY DATA:  I have reviewed the data as listed    Component Value Date/Time   NA 138 03/01/2020 0818   K 4.1 03/01/2020 0818   CL 106 03/01/2020 0818   CO2 23 03/01/2020 0818   GLUCOSE 85  03/01/2020 0818   BUN 4 (L) 03/01/2020 0818   CREATININE 0.85 03/01/2020 0818   CALCIUM 9.6 03/01/2020 0818   PROT 6.5 03/01/2020 0818   ALBUMIN 3.3 (L) 03/01/2020 0818   AST 12 (L) 03/01/2020 0818   ALT 7 03/01/2020 0818   ALKPHOS 89 03/01/2020 0818   BILITOT 0.5 03/01/2020 0818   GFRNONAA >60 03/01/2020 0818   GFRAA >60 03/01/2020 0818    No results found for: SPEP, UPEP  Lab Results  Component Value Date   WBC 3.6 (L) 03/01/2020   NEUTROABS 2.3 03/01/2020   HGB 12.5 03/01/2020   HCT 38.0 03/01/2020   MCV 85.6 03/01/2020   PLT 248 03/01/2020      Chemistry      Component Value Date/Time   NA 138 03/01/2020 0818   K 4.1 03/01/2020 0818   CL 106 03/01/2020 0818   CO2 23 03/01/2020 0818   BUN 4 (L) 03/01/2020 0818   CREATININE 0.85   03/01/2020 0818      Component Value Date/Time   CALCIUM 9.6 03/01/2020 0818   ALKPHOS 89 03/01/2020 0818   AST 12 (L) 03/01/2020 0818   ALT 7 03/01/2020 0818   BILITOT 0.5 03/01/2020 0818      

## 2020-03-08 ENCOUNTER — Encounter (HOSPITAL_COMMUNITY): Payer: Self-pay

## 2020-03-08 ENCOUNTER — Other Ambulatory Visit: Payer: Self-pay

## 2020-03-08 ENCOUNTER — Ambulatory Visit (HOSPITAL_COMMUNITY)
Admission: RE | Admit: 2020-03-08 | Discharge: 2020-03-08 | Disposition: A | Discharge status: Home / Self Care | Payer: Medicare HMO | Source: Ambulatory Visit | Attending: Hematology and Oncology | Admitting: Hematology and Oncology

## 2020-03-08 DIAGNOSIS — K5730 Diverticulosis of large intestine without perforation or abscess without bleeding: Secondary | ICD-10-CM | POA: Diagnosis not present

## 2020-03-08 DIAGNOSIS — Z5111 Encounter for antineoplastic chemotherapy: Secondary | ICD-10-CM | POA: Diagnosis not present

## 2020-03-08 DIAGNOSIS — K439 Ventral hernia without obstruction or gangrene: Secondary | ICD-10-CM | POA: Diagnosis not present

## 2020-03-08 DIAGNOSIS — C541 Malignant neoplasm of endometrium: Secondary | ICD-10-CM | POA: Diagnosis not present

## 2020-03-08 DIAGNOSIS — C55 Malignant neoplasm of uterus, part unspecified: Secondary | ICD-10-CM | POA: Insufficient documentation

## 2020-03-08 DIAGNOSIS — C774 Secondary and unspecified malignant neoplasm of inguinal and lower limb lymph nodes: Secondary | ICD-10-CM | POA: Insufficient documentation

## 2020-03-08 MED ORDER — IOHEXOL 300 MG/ML  SOLN
100.0000 mL | Freq: Once | INTRAMUSCULAR | Status: AC | PRN
  Administered 2020-03-08: 100 mL via INTRAVENOUS

## 2020-03-08 MED ORDER — SODIUM CHLORIDE (PF) 0.9 % IJ SOLN
INTRAMUSCULAR | Status: AC
  Filled 2020-03-08: qty 50

## 2020-03-08 MED ORDER — IOHEXOL 9 MG/ML PO SOLN
ORAL | Status: AC
  Filled 2020-03-08: qty 1000

## 2020-03-08 MED ORDER — IOHEXOL 9 MG/ML PO SOLN
500.0000 mL | ORAL | Status: AC
  Administered 2020-03-08 (×2): 500 mL via ORAL

## 2020-03-09 ENCOUNTER — Inpatient Hospital Stay: Payer: Medicare HMO | Admitting: Hematology and Oncology

## 2020-03-09 ENCOUNTER — Inpatient Hospital Stay: Payer: Medicare HMO

## 2020-03-09 ENCOUNTER — Encounter: Payer: Self-pay | Admitting: Hematology and Oncology

## 2020-03-09 ENCOUNTER — Other Ambulatory Visit: Payer: Self-pay

## 2020-03-09 DIAGNOSIS — Z299 Encounter for prophylactic measures, unspecified: Secondary | ICD-10-CM | POA: Diagnosis not present

## 2020-03-09 DIAGNOSIS — C55 Malignant neoplasm of uterus, part unspecified: Secondary | ICD-10-CM

## 2020-03-09 DIAGNOSIS — R197 Diarrhea, unspecified: Secondary | ICD-10-CM

## 2020-03-09 DIAGNOSIS — Z23 Encounter for immunization: Secondary | ICD-10-CM

## 2020-03-09 MED ORDER — DIPHENOXYLATE-ATROPINE 2.5-0.025 MG PO TABS: 1.0000 | ORAL_TABLET | Freq: Four times a day (QID) | ORAL | 0 refills | Status: DC | PRN

## 2020-03-09 NOTE — Assessment & Plan Note (Signed)
She continues to have severe chronic diarrhea She does not think it is food related although her symptoms are worse after eating I recommend a food diary I recommend her to continue Imodium and Lomotil I refilled the prescription today We discussed the role of GI follow-up but she would like to hold off for now

## 2020-03-09 NOTE — Assessment & Plan Note (Signed)
We discussed the role of booster vaccine in the setting of recent chemotherapy and cancer diagnosis She is interested to get her booster vaccine today and we will get it administered in the clinic

## 2020-03-09 NOTE — Progress Notes (Signed)
   Covid-19 Vaccination Clinic  Name:  Dana [name]    MRN: [id number] DOB: 1950-09-03  03/09/2020  [name] was observed post Covid-19 immunization for 15 minutes without incident. She was provided with Vaccine Information Sheet and instruction to access the V-Safe system.   [name] was instructed to call 911 with any severe reactions post vaccine: Marland Kitchen Difficulty breathing  . Swelling of face and throat  . A fast heartbeat  . A bad rash all over body  . Dizziness and weakness   Immunizations Administered    Name Date Dose VIS Date Route   Pfizer COVID-19 Vaccine 03/09/2020 11:03 AM 0.3 mL 09/10/2018 Intramuscular   Manufacturer: [place]   Lot: [id number]   [place]: [id number]

## 2020-03-09 NOTE — Progress Notes (Signed)
[facility] OFFICE PROGRESS NOTE  Patient Care Team: Shon Baton, MD as PCP - General (Internal Medicine) Awanda Mink Craige Cotta, RN as Oncology Nurse Navigator (Oncology)  ASSESSMENT & PLAN:  Endometrioid adenocarcinoma of uterus [facility]) I have reviewed multiple imaging studies with the patient She has achieved complete response on recent CT imaging She is still having significant symptoms of diarrhea despite stopping Afinitor I recommend she stops taking letrozole as well I plan to see her again next month for further follow-up If she have persistent symptoms, we will refer her to see GI service She may not be able to continue taking her treatment and I might recommend a treatment break or other forms of treatment  Diarrhea She continues to have severe chronic diarrhea She does not think it is food related although her symptoms are worse after eating I recommend a food diary I recommend her to continue Imodium and Lomotil I refilled the prescription today We discussed the role of GI follow-up but she would like to hold off for now  Preventive measure We discussed the role of booster vaccine in the setting of recent chemotherapy and cancer diagnosis She is interested to get her booster vaccine today and we will get it administered in the clinic   No orders of the defined types were placed in this encounter.   All questions were answered. The patient knows to call the clinic with any problems, questions or concerns. The total time spent in the appointment was 20 minutes encounter with patients including review of chart and various tests results, discussions about plan of care and coordination of care plan   [name], MD 03/09/2020 10:20 AM  INTERVAL HISTORY: Please see below for problem oriented charting. She returns for chemotherapy and follow-up Despite stopping Afinitor for 2 weeks, she has persistent diarrhea especially after eating Denies abdominal pain or  cramping No nausea vomiting Denies fever or chills She continues taking Imodium and Lomotil She is not keeping a food diary She has lost some weight because of this SUMMARY OF ONCOLOGIC HISTORY: Oncology History Overview Note  ER 90%, PR 95% PD-L1 score 1% MSI Stable   Endometrioid adenocarcinoma of uterus ([place])  01/05/2014 Initial Diagnosis   The patient has a history of a 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  The patient's initial surgery was with Dr. Marti Sleigh in [place] on January 05, 2014.  It included an exploratory laparotomy with TAH, BSO, appendectomy, pelvic and para-aortic lymphadenectomy and wedge resection of a concerning appearing liver lesion.  Preoperatively she presented with a 26 cm right adnexal mass and an elevated Ca1 25 at 404 units/mL.  At the completion of surgery there was no gross residual disease.  Final pathology showed a primary grade 3 endometrial adenocarcinoma (endometrioid) involving both ovaries.  Lymph nodes and other biopsies were negative.  She had uncomplicated postoperative course.  She was treated with 6 cycles of carboplatin paclitaxel chemotherapy.  She was recommended adjuvant radiation therapy however declined this after counseling.  Following her initial therapy she developed severe left lower extremity lymphedema.   Follow-up CT scan in March 2016 showed no evidence of disease.  She was followed every 3 months for a total of 5 years with no recurrence appreciated during that time   03/28/2019 Imaging   Ct abdomen and pelvis 1. Enlarged right inguinal lymph node or soft tissue mass, measuring 3.8 x 3.5 cm (series 2, image 69).  Additional enlarged right iliac lymph node measuring 1.9 x 1.1 cm (series 2, image 68). Findings are concerning for malignancy.   2. Status post hysterectomy per stated history of endometrial malignancy.   3. No other findings in the abdomen or pelvis  concerning for malignancy or metastatic disease.   4. There are multiple subcentimeter low-attenuation lesions of the liver, the largest in the left lobe the measuring 7 mm, too small to characterize (series 2, image 24). Attention on follow-up although these are most likely incidental benign small cysts and/or hemangiomata.   5.  Aortic atherosclerosis.   04/10/2019 Pathology Results   A. LYMPH NODE, RIGHT INGUINAL, NEEDLE CORE BIOPSY:  - Metastatic adenocarcinoma.  - See comment.   COMMENT:   The morphology is most consistent with metastatic endometrioid adenocarcinoma.  Immunohistochemistry can be performed if clinically indicated.  There is sufficient tissue for additional testing   04/10/2019 Procedure   Technically successful ultrasound guided biopsy of dominant right inguinal lymph node   04/22/2019 Cancer Staging   Staging form: Corpus Uteri - Carcinoma and Carcinosarcoma, AJCC 8th Edition - Pathologic stage from 04/22/2019: FIGO Stage IVB (rpT3a, pN0, pM1) - Signed by [name], MD on 04/22/2019   04/22/2019 PET scan   1. Two hypermetabolic RIGHT iliac lymph nodes and larger metastatic RIGHT inguinal lymph node. 2. No evidence of metastatic disease outside the pelvis. 3. No clear evidence of activity at the vaginal cuff.   05/15/2019 Procedure   Placement of a subcutaneous port device. Catheter tip in the lower SVC   05/16/2019 - 06/12/2019 Chemotherapy   The patient had cisplatin for chemotherapy treatment.      Genetic Testing   Patient has genetic testing done for MSI. Results revealed patient has the following: MSI Stable   06/23/2019 Imaging   1. Interval decreased size of right inguinal and external iliac lymph nodes. 2. No evidence of progressive metastatic disease. 3. Stable small low-density hepatic lesions, likely cysts. 4. Aortic Atherosclerosis (ICD10-I70.0).     07/14/2019 -  Chemotherapy   The patient had everolimus and letrozole for chemotherapy  treatment.     10/20/2019 Imaging   1. No acute abdominal/pelvic findings, mass lesions or adenopathy. 2. Interval resolution of right iliac lymph node. No residual measurable lymph node is identified. Decrease in size of the right inguinal node. 3. Stable scattered hepatic cysts.   Aortic Atherosclerosis (ICD10-I70.0).   03/08/2020 Imaging   No evidence of recurrent or metastatic carcinoma within the abdomen or pelvis.   Colonic diverticulosis. No radiographic evidence of diverticulitis.   Stable small epigastric ventral hernia containing only fat.       REVIEW OF SYSTEMS:   Constitutional: Denies fevers, chills  Eyes: Denies blurriness of vision Ears, nose, mouth, throat, and face: Denies mucositis or sore throat Respiratory: Denies cough, dyspnea or wheezes Cardiovascular: Denies palpitation, chest discomfort or lower extremity swelling GSkin: Denies abnormal skin rashes Lymphatics: Denies new lymphadenopathy or easy bruising Neurological:Denies numbness, tingling or new weaknesses Behavioral/Psych: Mood is stable, no new changes  All other systems were reviewed with the patient and are negative.  I have reviewed the past medical history, past surgical history, social history and family history with the patient and they are unchanged from previous note.  ALLERGIES:  is allergic to oxycodone, garlic, other, and penicillins.  MEDICATIONS:  Current Outpatient Medications  Medication Sig Dispense Refill   amLODipine (NORVASC) 5 MG tablet Take 1 tablet (5 mg total) by mouth daily. 90 tablet 11  diphenoxylate-atropine (LOMOTIL) 2.5-0.025 MG tablet Take 1 tablet by mouth 4 (four) times daily as needed for diarrhea or loose stools. 90 tablet 0   everolimus (AFINITOR) 5 MG tablet Take 1 tablet (5 mg total) by mouth daily. 28 tablet 11   gabapentin (NEURONTIN) 300 MG capsule Take 1 capsule (300 mg total) by mouth 2 (two) times daily. 60 capsule 11   ibuprofen (ADVIL) 200 MG  tablet Take 200 mg by mouth every 6 (six) hours as needed.     letrozole (FEMARA) 2.5 MG tablet Take 1 tablet (2.5 mg total) by mouth daily. 90 tablet 3   lidocaine-prilocaine (EMLA) cream Apply to affected area once 30 g 3   loperamide (IMODIUM A-D) 2 MG tablet Take 4 mg by mouth 4 (four) times daily as needed.     ondansetron (ZOFRAN) 8 MG tablet Take 1 tablet (8 mg total) by mouth every 8 (eight) hours as needed. 30 tablet 1   prochlorperazine (COMPAZINE) 10 MG tablet Take 1 tablet (10 mg total) by mouth every 6 (six) hours as needed (Nausea or vomiting). 30 tablet 1   traMADol (ULTRAM) 50 MG tablet TK 1 T PO  BID PRF PAINS.     No current facility-administered medications for this visit.    PHYSICAL EXAMINATION: ECOG PERFORMANCE STATUS: 1 - Symptomatic but completely ambulatory  Vitals:   03/09/20 0950  BP: 122/78  Pulse: 62  Resp: 17  Temp: 97.8 F (36.6 C)  SpO2: 100%   Filed Weights   03/09/20 0950  Weight: 164 lb 9.6 oz (74.7 kg)    GENERAL:alert, no distress and comfortable NEURO: alert & oriented x 3 with fluent speech, no focal motor/sensory deficits  LABORATORY DATA:  I have reviewed the data as listed    Component Value Date/Time   NA 138 03/01/2020 0818   K 4.1 03/01/2020 0818   CL 106 03/01/2020 0818   CO2 23 03/01/2020 0818   GLUCOSE 85 03/01/2020 0818   BUN 4 (L) 03/01/2020 0818   CREATININE 0.85 03/01/2020 0818   CALCIUM 9.6 03/01/2020 0818   PROT 6.5 03/01/2020 0818   ALBUMIN 3.3 (L) 03/01/2020 0818   AST 12 (L) 03/01/2020 0818   ALT 7 03/01/2020 0818   ALKPHOS 89 03/01/2020 0818   BILITOT 0.5 03/01/2020 0818   GFRNONAA >60 03/01/2020 0818   GFRAA >60 03/01/2020 0818    No results found for: SPEP, UPEP  Lab Results  Component Value Date   WBC 3.6 (L) 03/01/2020   NEUTROABS 2.3 03/01/2020   HGB 12.5 03/01/2020   HCT 38.0 03/01/2020   MCV 85.6 03/01/2020   PLT 248 03/01/2020      Chemistry      Component Value Date/Time   NA  138 03/01/2020 0818   K 4.1 03/01/2020 0818   CL 106 03/01/2020 0818   CO2 23 03/01/2020 0818   BUN 4 (L) 03/01/2020 0818   CREATININE 0.85 03/01/2020 0818      Component Value Date/Time   CALCIUM 9.6 03/01/2020 0818   ALKPHOS 89 03/01/2020 0818   AST 12 (L) 03/01/2020 0818   ALT 7 03/01/2020 0818   BILITOT 0.5 03/01/2020 0818       RADIOGRAPHIC STUDIES: I have reviewed multiple CT imaging with the patient I have personally reviewed the radiological images as listed and agreed with the findings in the report. CT ABDOMEN PELVIS W CONTRAST  Result Date: 03/08/2020 CLINICAL DATA:  Follow-up endometrial carcinoma. Ongoing chemotherapy. EXAM: CT ABDOMEN AND  PELVIS WITH CONTRAST TECHNIQUE: Multidetector CT imaging of the abdomen and pelvis was performed using the standard protocol following bolus administration of intravenous contrast. CONTRAST:  141m OMNIPAQUE IOHEXOL 300 MG/ML  SOL[facility] [place]  [place] Chest: No acute findings. Hepatobiliary: No hepatic masses identified. A few tiny sub-cm hepatic cysts remains stable. Gallbladder is unremarkable. No evidence of biliary ductal dilatation. Pancreas:  No mass or inflammatory changes. Spleen: Within normal limits in size and appearance. Adrenals/Urinary Tract: No masses identified. No evidence of ureteral calculi or hydronephrosis. Stomach/Bowel: No evidence of obstruction, inflammatory process or abnormal fluid collections. Diverticulosis is seen mainly involving the descending and sigmoid colon, however there is no evidence of diverticulitis. Vascular/Lymphatic: No pathologically enlarged lymph nodes. No abdominal aortic aneurysm. Reproductive: Prior hysterectomy noted. Adnexal regions are unremarkable in appearance. Other:  Stable small epigastric ventral hernia containing only fat. Musculoskeletal: Left hip prosthesis noted. No suspicious bone lesions identified. IMPRESSION: No evidence of recurrent or  metastatic carcinoma within the abdomen or pelvis. Colonic diverticulosis. No radiographic evidence of diverticulitis. Stable small epigastric ventral hernia containing only fat. Electronically Signed   By: JMarlaine HindM.D.   On: 03/08/2020 12:32

## 2020-03-09 NOTE — Assessment & Plan Note (Signed)
I have reviewed multiple imaging studies with the patient She has achieved complete response on recent CT imaging She is still having significant symptoms of diarrhea despite stopping Afinitor I recommend she stops taking letrozole as well I plan to see her again next month for further follow-up If she have persistent symptoms, we will refer her to see GI service She may not be able to continue taking her treatment and I might recommend a treatment break or other forms of treatment

## 2020-04-06 ENCOUNTER — Inpatient Hospital Stay: Payer: Medicare HMO | Attending: Gynecologic Oncology | Admitting: Hematology and Oncology

## 2020-04-06 ENCOUNTER — Inpatient Hospital Stay: Payer: Medicare HMO

## 2020-04-06 ENCOUNTER — Other Ambulatory Visit: Payer: Self-pay

## 2020-04-06 ENCOUNTER — Encounter: Payer: Self-pay | Admitting: Hematology and Oncology

## 2020-04-06 VITALS — BP 138/80 | HR 59 | Temp 97.9°F | Resp 18 | Ht 61.0 in | Wt 163.4 lb

## 2020-04-06 DIAGNOSIS — T451X5A Adverse effect of antineoplastic and immunosuppressive drugs, initial encounter: Secondary | ICD-10-CM

## 2020-04-06 DIAGNOSIS — Z79899 Other long term (current) drug therapy: Secondary | ICD-10-CM | POA: Diagnosis not present

## 2020-04-06 DIAGNOSIS — D701 Agranulocytosis secondary to cancer chemotherapy: Secondary | ICD-10-CM | POA: Diagnosis not present

## 2020-04-06 DIAGNOSIS — C541 Malignant neoplasm of endometrium: Secondary | ICD-10-CM | POA: Insufficient documentation

## 2020-04-06 DIAGNOSIS — C55 Malignant neoplasm of uterus, part unspecified: Secondary | ICD-10-CM

## 2020-04-06 DIAGNOSIS — R197 Diarrhea, unspecified: Secondary | ICD-10-CM | POA: Insufficient documentation

## 2020-04-06 DIAGNOSIS — I890 Lymphedema, not elsewhere classified: Secondary | ICD-10-CM | POA: Diagnosis not present

## 2020-04-06 LAB — COMPREHENSIVE METABOLIC PANEL
ALT: 13 U/L (ref 0–44)
AST: 14 U/L — ABNORMAL LOW (ref 15–41)
Albumin: 3.5 g/dL (ref 3.5–5.0)
Alkaline Phosphatase: 89 U/L (ref 38–126)
Anion gap: 10 (ref 5–15)
BUN: 9 mg/dL (ref 8–23)
CO2: 24 mmol/L (ref 22–32)
Calcium: 9.1 mg/dL (ref 8.9–10.3)
Chloride: 104 mmol/L (ref 98–111)
Creatinine, Ser: 0.9 mg/dL (ref 0.44–1.00)
GFR calc Af Amer: >60 mL/min (ref >60)
GFR calc non Af Amer: >60 mL/min (ref >60)
Glucose, Bld: 86 mg/dL (ref 70–99)
Potassium: 4.2 mmol/L (ref 3.5–5.1)
Sodium: 138 mmol/L (ref 135–145)
Total Bilirubin: 0.5 mg/dL (ref 0.3–1.2)
Total Protein: 6.6 g/dL (ref 6.5–8.1)

## 2020-04-06 LAB — CBC WITH DIFFERENTIAL/PLATELET
Abs Immature Granulocytes: 0.01 10*3/uL (ref 0.00–0.07)
Basophils Absolute: 0 10*3/uL (ref 0.0–0.1)
Basophils Relative: 1 %
Eosinophils Absolute: 0.1 10*3/uL (ref 0.0–0.5)
Eosinophils Relative: 2 %
HCT: 41.3 % (ref 36.0–46.0)
Hemoglobin: 13.4 g/dL (ref 12.0–15.0)
Immature Granulocytes: 0 %
Lymphocytes Relative: 23 %
Lymphs Abs: 0.9 10*3/uL (ref 0.7–4.0)
MCH: 28.5 pg (ref 26.0–34.0)
MCHC: 32.4 g/dL (ref 30.0–36.0)
MCV: 87.9 fL (ref 80.0–100.0)
Monocytes Absolute: 0.4 10*3/uL (ref 0.1–1.0)
Monocytes Relative: 12 %
Neutro Abs: 2.3 10*3/uL (ref 1.7–7.7)
Neutrophils Relative %: 62 %
Platelets: 218 10*3/uL (ref 150–400)
RBC: 4.7 MIL/uL (ref 3.87–5.11)
RDW: 16.1 % — ABNORMAL HIGH (ref 11.5–15.5)
WBC: 3.8 10*3/uL — ABNORMAL LOW (ref 4.0–10.5)
nRBC: 0 % (ref 0.0–0.2)

## 2020-04-06 MED ORDER — SODIUM CHLORIDE 0.9% FLUSH
10.0000 mL | Freq: Once | INTRAVENOUS | Status: AC
  Administered 2020-04-06: 10 mL
  Filled 2020-04-06: qty 10

## 2020-04-06 MED ORDER — HEPARIN SOD (PORK) LOCK FLUSH 100 UNIT/ML IV SOLN
500.0000 [IU] | Freq: Once | INTRAVENOUS | Status: AC
  Administered 2020-04-06: 500 [IU]
  Filled 2020-04-06: qty 5

## 2020-04-06 NOTE — Assessment & Plan Note (Signed)
Lymphedema is stable Observe for now

## 2020-04-06 NOTE — Progress Notes (Signed)
[facility] OFFICE PROGRESS NOTE  Patient Care Team: Shon Baton, MD as PCP - General (Internal Medicine) Awanda Mink Craige Cotta, RN as Oncology Nurse Navigator (Oncology)  ASSESSMENT & PLAN:  Endometrioid adenocarcinoma of uterus [facility]) She is still having significant symptoms of diarrhea despite stopping Afinitor and letrozole I suspect this is related to possible radiation proctitis The only way to diagnose would be through colonoscopy which the patient declined For now, she is in agreement to be observed She is at high risk of cancer recurrence I plan to repeat imaging study again in November to evaluate  Leukopenia due to antineoplastic chemotherapy [facility]) This is likely due to recent treatment. The patient denies recent history of fevers, cough, chills, diarrhea or dysuria. She is asymptomatic from the leukopenia. I will observe for now.     Lymphedema Lymphedema is stable Observe for now   Orders Placed This Encounter  Procedures  . CT ABDOMEN PELVIS W CONTRAST    Standing Status:   Future    Standing Expiration Date:   04/06/2021    Order Specific Question:   If indicated for the ordered procedure, I authorize the administration of contrast media per Radiology protocol    Answer:   Yes    Order Specific Question:   Preferred imaging location?    Answer:   [facility]    Order Specific Question:   Radiology Contrast Protocol - do NOT remove file path    Answer:   [email or web address]    All questions were answered. The patient knows to call the clinic with any problems, questions or concerns. The total time spent in the appointment was 20 minutes encounter with patients including review of chart and various tests results, discussions about plan of care and coordination of care plan   [name], MD 04/06/2020 9:23 AM  INTERVAL HISTORY: Please see below for problem oriented charting. She returns for further treatment  and follow-up Since last time I saw her, she continues to have significant persistent postprandial diarrhea She have to take regular Lomotil She is not able to leave the house for fear of having an accident She had bilateral lower extremity edema, stable She has lost a bit of weight No recent infection, fever or chills Denies nausea or abdominal pain  SUMMARY OF ONCOLOGIC HISTORY: Oncology History Overview Note  ER 90%, PR 95% PD-L1 score 1% MSI Stable   Endometrioid adenocarcinoma of uterus ([place])  01/05/2014 Initial Diagnosis   The patient has a history of a 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  The patient's initial surgery was with Dr. Marti Sleigh in [place] on January 05, 2014.  It included an exploratory laparotomy with TAH, BSO, appendectomy, pelvic and para-aortic lymphadenectomy and wedge resection of a concerning appearing liver lesion.  Preoperatively she presented with a 26 cm right adnexal mass and an elevated Ca1 25 at 404 units/mL.  At the completion of surgery there was no gross residual disease.  Final pathology showed a primary grade 3 endometrial adenocarcinoma (endometrioid) involving both ovaries.  Lymph nodes and other biopsies were negative.  She had uncomplicated postoperative course.  She was treated with 6 cycles of carboplatin paclitaxel chemotherapy.  She was recommended adjuvant radiation therapy however declined this after counseling.  Following her initial therapy she developed severe left lower extremity lymphedema.   Follow-up CT scan in March 2016 showed no evidence of disease.  She was followed every 3  months for a total of 5 years with no recurrence appreciated during that time   03/28/2019 Imaging   Ct abdomen and pelvis 1. Enlarged right inguinal lymph node or soft tissue mass, measuring 3.8 x 3.5 cm (series 2, image 69). Additional enlarged right iliac lymph node measuring 1.9 x 1.1 cm  (series 2, image 68). Findings are concerning for malignancy.   2. Status post hysterectomy per stated history of endometrial malignancy.   3. No other findings in the abdomen or pelvis concerning for malignancy or metastatic disease.   4. There are multiple subcentimeter low-attenuation lesions of the liver, the largest in the left lobe the measuring 7 mm, too small to characterize (series 2, image 24). Attention on follow-up although these are most likely incidental benign small cysts and/or hemangiomata.   5.  Aortic atherosclerosis.   04/10/2019 Pathology Results   A. LYMPH NODE, RIGHT INGUINAL, NEEDLE CORE BIOPSY:  - Metastatic adenocarcinoma.  - See comment.   COMMENT:   The morphology is most consistent with metastatic endometrioid adenocarcinoma.  Immunohistochemistry can be performed if clinically indicated.  There is sufficient tissue for additional testing   04/10/2019 Procedure   Technically successful ultrasound guided biopsy of dominant right inguinal lymph node   04/22/2019 Cancer Staging   Staging form: Corpus Uteri - Carcinoma and Carcinosarcoma, AJCC 8th Edition - Pathologic stage from 04/22/2019: FIGO Stage IVB (rpT3a, pN0, pM1) - Signed by [name], MD on 04/22/2019   04/22/2019 PET scan   1. Two hypermetabolic RIGHT iliac lymph nodes and larger metastatic RIGHT inguinal lymph node. 2. No evidence of metastatic disease outside the pelvis. 3. No clear evidence of activity at the vaginal cuff.   05/15/2019 Procedure   Placement of a subcutaneous port device. Catheter tip in the lower SVC   05/16/2019 - 06/12/2019 Chemotherapy   The patient had cisplatin for chemotherapy treatment.      Genetic Testing   Patient has genetic testing done for MSI. Results revealed patient has the following: MSI Stable   06/23/2019 Imaging   1. Interval decreased size of right inguinal and external iliac lymph nodes. 2. No evidence of progressive metastatic disease. 3. Stable  small low-density hepatic lesions, likely cysts. 4. Aortic Atherosclerosis (ICD10-I70.0).     07/14/2019 -  Chemotherapy   The patient had everolimus and letrozole for chemotherapy treatment.     10/20/2019 Imaging   1. No acute abdominal/pelvic findings, mass lesions or adenopathy. 2. Interval resolution of right iliac lymph node. No residual measurable lymph node is identified. Decrease in size of the right inguinal node. 3. Stable scattered hepatic cysts.   Aortic Atherosclerosis (ICD10-I70.0).   03/08/2020 Imaging   No evidence of recurrent or metastatic carcinoma within the abdomen or pelvis.   Colonic diverticulosis. No radiographic evidence of diverticulitis.   Stable small epigastric ventral hernia containing only fat.       REVIEW OF SYSTEMS:   Constitutional: Denies fevers, chills or abnormal weight loss Eyes: Denies blurriness of vision Ears, nose, mouth, throat, and face: Denies mucositis or sore throat Respiratory: Denies cough, dyspnea or wheezes Cardiovascular: Denies palpitation, chest discomfort  Skin: Denies abnormal skin rashes Lymphatics: Denies new lymphadenopathy or easy bruising Neurological:Denies numbness, tingling or new weaknesses Behavioral/Psych: Mood is stable, no new changes  All other systems were reviewed with the patient and are negative.  I have reviewed the past medical history, past surgical history, social history and family history with the patient and they are unchanged from previous  note.  ALLERGIES:  is allergic to oxycodone, garlic, other, and penicillins.  MEDICATIONS:  Current Outpatient Medications  Medication Sig Dispense Refill  . amLODipine (NORVASC) 5 MG tablet Take 1 tablet (5 mg total) by mouth daily. 90 tablet 11  . diphenoxylate-atropine (LOMOTIL) 2.5-0.025 MG tablet Take 1 tablet by mouth 4 (four) times daily as needed for diarrhea or loose stools. 90 tablet 0  . gabapentin (NEURONTIN) 300 MG capsule Take 1 capsule (300  mg total) by mouth 2 (two) times daily. 60 capsule 11  . ibuprofen (ADVIL) 200 MG tablet Take 200 mg by mouth every 6 (six) hours as needed.    . lidocaine-prilocaine (EMLA) cream Apply to affected area once 30 g 3  . loperamide (IMODIUM A-D) 2 MG tablet Take 4 mg by mouth 4 (four) times daily as needed.    . ondansetron (ZOFRAN) 8 MG tablet Take 1 tablet (8 mg total) by mouth every 8 (eight) hours as needed. 30 tablet 1  . prochlorperazine (COMPAZINE) 10 MG tablet Take 1 tablet (10 mg total) by mouth every 6 (six) hours as needed (Nausea or vomiting). 30 tablet 1  . traMADol (ULTRAM) 50 MG tablet TK 1 T PO  BID PRF PAINS.     No current facility-administered medications for this visit.    PHYSICAL EXAMINATION: ECOG PERFORMANCE STATUS: 1 - Symptomatic but completely ambulatory  Vitals:   04/06/20 0837  BP: 138/80  Pulse: (!) 59  Resp: 18  Temp: 97.9 F (36.6 C)  SpO2: 100%   Filed Weights   04/06/20 0837  Weight: 163 lb 6.4 oz (74.1 kg)    GENERAL:alert, no distress and comfortable SKIN: skin color, texture, turgor are normal, no rashes or significant lesions EYES: normal, Conjunctiva are pink and non-injected, sclera clear OROPHARYNX:no exudate, no erythema and lips, buccal mucosa, and tongue normal  NECK: supple, thyroid normal size, non-tender, without nodularity LYMPH:  no palpable lymphadenopathy in the cervical, axillary or inguinal LUNGS: clear to auscultation and percussion with normal breathing effort HEART: regular rate & rhythm and no murmurs with persistent bilateral lower extremity edema ABDOMEN:abdomen soft, non-tender and normal bowel sounds Musculoskeletal:no cyanosis of digits and no clubbing  NEURO: alert & oriented x 3 with fluent speech, no focal motor/sensory deficits  LABORATORY DATA:  I have reviewed the data as listed    Component Value Date/Time   NA 138 04/06/2020 0827   K 4.2 04/06/2020 0827   CL 104 04/06/2020 0827   CO2 24 04/06/2020 0827    GLUCOSE 86 04/06/2020 0827   BUN 9 04/06/2020 0827   CREATININE 0.90 04/06/2020 0827   CALCIUM 9.1 04/06/2020 0827   PROT 6.6 04/06/2020 0827   ALBUMIN 3.5 04/06/2020 0827   AST 14 (L) 04/06/2020 0827   ALT 13 04/06/2020 0827   ALKPHOS 89 04/06/2020 0827   BILITOT 0.5 04/06/2020 0827   GFRNONAA >60 04/06/2020 0827   GFRAA >60 04/06/2020 0827    No results found for: SPEP, UPEP  Lab Results  Component Value Date   WBC 3.8 (L) 04/06/2020   NEUTROABS 2.3 04/06/2020   HGB 13.4 04/06/2020   HCT 41.3 04/06/2020   MCV 87.9 04/06/2020   PLT 218 04/06/2020      Chemistry      Component Value Date/Time   NA 138 04/06/2020 0827   K 4.2 04/06/2020 0827   CL 104 04/06/2020 0827   CO2 24 04/06/2020 0827   BUN 9 04/06/2020 0827   CREATININE 0.90 04/06/2020  0827      Component Value Date/Time   CALCIUM 9.1 04/06/2020 0827   ALKPHOS 89 04/06/2020 0827   AST 14 (L) 04/06/2020 0827   ALT 13 04/06/2020 0827   BILITOT 0.5 04/06/2020 0827

## 2020-04-06 NOTE — Assessment & Plan Note (Signed)
This is likely due to recent treatment. The patient denies recent history of fevers, cough, chills, diarrhea or dysuria. She is asymptomatic from the leukopenia. I will observe for now.    

## 2020-04-06 NOTE — Assessment & Plan Note (Signed)
She is still having significant symptoms of diarrhea despite stopping Afinitor and letrozole I suspect this is related to possible radiation proctitis The only way to diagnose would be through colonoscopy which the patient declined For now, she is in agreement to be observed She is at high risk of cancer recurrence I plan to repeat imaging study again in November to evaluate

## 2020-04-07 ENCOUNTER — Telehealth: Payer: Self-pay | Admitting: Oncology

## 2020-04-07 NOTE — Telephone Encounter (Signed)
Yes pls proceed 

## 2020-04-07 NOTE — Telephone Encounter (Signed)
Dana [name] called and said she needs dental work - 2 cavities filled and x rays.  She is wondering if this is ok.

## 2020-04-07 NOTE — Telephone Encounter (Signed)
Called [place] and let her know.  She requested that we call [facility]. Called them and also faxed ok to [phone].

## 2020-04-14 DIAGNOSIS — R69 Illness, unspecified: Secondary | ICD-10-CM | POA: Diagnosis not present

## 2020-04-15 DIAGNOSIS — R69 Illness, unspecified: Secondary | ICD-10-CM | POA: Diagnosis not present

## 2020-04-26 ENCOUNTER — Telehealth: Payer: Self-pay | Admitting: Oncology

## 2020-04-26 NOTE — Telephone Encounter (Signed)
Dana [name] called and said she received a disability form and is wondering if Dr. Alvy Bimler would like her to have an appointment to fill it out.  Advised her to send the form over via email and that we will check with Dr. Alvy Bimler tomorrow and call her back.

## 2020-04-27 ENCOUNTER — Telehealth: Payer: Self-pay

## 2020-04-27 NOTE — Telephone Encounter (Signed)
-----   Message from [name], MD sent at 04/27/2020  8:40 AM EDT ----- Regarding: disability paperwork There are too many questions in the form that I cannot answer without talking to her Can you call and schedule a 20 mins appt tomorrow?Show up early pls

## 2020-04-27 NOTE — Telephone Encounter (Signed)
Called and given below message. She verbalized understanding. Scheduled appt for tomorrow at 10 am. She is aware of the time.

## 2020-04-28 ENCOUNTER — Inpatient Hospital Stay: Payer: Medicare HMO | Attending: Gynecologic Oncology | Admitting: Hematology and Oncology

## 2020-04-28 ENCOUNTER — Other Ambulatory Visit: Payer: Self-pay

## 2020-04-28 ENCOUNTER — Encounter: Payer: Self-pay | Admitting: Hematology and Oncology

## 2020-04-28 DIAGNOSIS — D849 Immunodeficiency, unspecified: Secondary | ICD-10-CM | POA: Diagnosis not present

## 2020-04-28 DIAGNOSIS — I890 Lymphedema, not elsewhere classified: Secondary | ICD-10-CM | POA: Insufficient documentation

## 2020-04-28 DIAGNOSIS — C55 Malignant neoplasm of uterus, part unspecified: Secondary | ICD-10-CM | POA: Diagnosis not present

## 2020-04-28 DIAGNOSIS — R5381 Other malaise: Secondary | ICD-10-CM | POA: Diagnosis not present

## 2020-04-28 DIAGNOSIS — R197 Diarrhea, unspecified: Secondary | ICD-10-CM | POA: Diagnosis not present

## 2020-04-28 DIAGNOSIS — K529 Noninfective gastroenteritis and colitis, unspecified: Secondary | ICD-10-CM | POA: Insufficient documentation

## 2020-04-28 DIAGNOSIS — T451X5A Adverse effect of antineoplastic and immunosuppressive drugs, initial encounter: Secondary | ICD-10-CM | POA: Diagnosis not present

## 2020-04-28 DIAGNOSIS — G620 Drug-induced polyneuropathy: Secondary | ICD-10-CM | POA: Diagnosis not present

## 2020-04-28 DIAGNOSIS — C541 Malignant neoplasm of endometrium: Secondary | ICD-10-CM | POA: Diagnosis not present

## 2020-04-28 DIAGNOSIS — R112 Nausea with vomiting, unspecified: Secondary | ICD-10-CM | POA: Diagnosis not present

## 2020-04-28 NOTE — Assessment & Plan Note (Signed)
She has residual peripheral neuropathy from prior treatment She has balance issue She will continue taking gabapentin

## 2020-04-28 NOTE — Progress Notes (Signed)
[facility] OFFICE PROGRESS NOTE  Patient Care Team: Shon Baton, MD as PCP - General (Internal Medicine) Awanda Mink Craige Cotta, RN as Oncology Nurse Navigator (Oncology)  ASSESSMENT & PLAN:  Endometrioid adenocarcinoma of uterus [facility]) Despite stopping recent chemotherapy, she continues to experience multiple side effects from treatment and completely debilitated She cannot return back to work She is scheduled for CT imaging next month She is at high risk of cancer recurrence  Peripheral neuropathy due to chemotherapy [facility]) She has residual peripheral neuropathy from prior treatment She has balance issue She will continue taking gabapentin  Lymphedema She has chronic lymphedema due to prior treatment She is at risk of recurrent blood clot She is debilitated due to her lymphedema that affects her mobility  Diarrhea She continues to have severe chronic diarrhea She does not think it is food related although her symptoms are worse after eating I recommend a food diary I recommend her to continue Imodium and Lomotil We discussed the role of GI follow-up but she would like to hold off for now  Immunocompromised patient [facility]) She is immunocompromise due to recent treatment I recommend she does not return back to work and minimize contact We discussed hand hygiene and neutropenic precaution  Nausea and vomiting She continues to have intermittent nausea and vomiting She will continue antiemetics as needed  Physical debility She is generalized weak due to multiple side effects from treatment I do not recommend her to return back to work I spent some time today completing the form for work to support her need for disability status   No orders of the defined types were placed in this encounter.   All questions were answered. The patient knows to call the clinic with any problems, questions or concerns. The total time spent in the appointment was 30 minutes encounter with  patients including review of chart and various tests results, discussions about plan of care and coordination of care plan   [name], MD 04/28/2020 3:24 PM  INTERVAL HISTORY: Please see below for problem oriented charting. She is seen specifically to reviewed her functional status I received a form from her insurance company/work regarding her ability to return back to work We reviewed the form and completed it together Since last time I saw her, she continues to have balance difficulties and peripheral neuropathy She has a circulation issue with chronic bilateral lower extremity edema She had recent nausea and vomiting She has persistent chronic diarrhea with food She is weak overall No recent infection, fever or chills SUMMARY OF ONCOLOGIC HISTORY: Oncology History Overview Note  ER 90%, PR 95% PD-L1 score 1% MSI Stable   Endometrioid adenocarcinoma of uterus ([place])  01/05/2014 Initial Diagnosis   The patient has a history of a 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  The patient's initial surgery was with Dr. Marti Sleigh in [place] on January 05, 2014.  It included an exploratory laparotomy with TAH, BSO, appendectomy, pelvic and para-aortic lymphadenectomy and wedge resection of a concerning appearing liver lesion.  Preoperatively she presented with a 26 cm right adnexal mass and an elevated Ca1 25 at 404 units/mL.  At the completion of surgery there was no gross residual disease.  Final pathology showed a primary grade 3 endometrial adenocarcinoma (endometrioid) involving both ovaries.  Lymph nodes and other biopsies were negative.  She had uncomplicated postoperative course.  She was treated with 6 cycles of carboplatin paclitaxel chemotherapy.  She was recommended adjuvant radiation therapy however declined this after counseling.  Following her initial therapy she developed severe left lower extremity lymphedema.    Follow-up CT scan in March 2016 showed no evidence of disease.  She was followed every 3 months for a total of 5 years with no recurrence appreciated during that time   03/28/2019 Imaging   Ct abdomen and pelvis 1. Enlarged right inguinal lymph node or soft tissue mass, measuring 3.8 x 3.5 cm (series 2, image 69). Additional enlarged right iliac lymph node measuring 1.9 x 1.1 cm (series 2, image 68). Findings are concerning for malignancy.   2. Status post hysterectomy per stated history of endometrial malignancy.   3. No other findings in the abdomen or pelvis concerning for malignancy or metastatic disease.   4. There are multiple subcentimeter low-attenuation lesions of the liver, the largest in the left lobe the measuring 7 mm, too small to characterize (series 2, image 24). Attention on follow-up although these are most likely incidental benign small cysts and/or hemangiomata.   5.  Aortic atherosclerosis.   04/10/2019 Pathology Results   A. LYMPH NODE, RIGHT INGUINAL, NEEDLE CORE BIOPSY:  - Metastatic adenocarcinoma.  - See comment.   COMMENT:   The morphology is most consistent with metastatic endometrioid adenocarcinoma.  Immunohistochemistry can be performed if clinically indicated.  There is sufficient tissue for additional testing   04/10/2019 Procedure   Technically successful ultrasound guided biopsy of dominant right inguinal lymph node   04/22/2019 Cancer Staging   Staging form: Corpus Uteri - Carcinoma and Carcinosarcoma, AJCC 8th Edition - Pathologic stage from 04/22/2019: FIGO Stage IVB (rpT3a, pN0, pM1) - Signed by [name], MD on 04/22/2019   04/22/2019 PET scan   1. Two hypermetabolic RIGHT iliac lymph nodes and larger metastatic RIGHT inguinal lymph node. 2. No evidence of metastatic disease outside the pelvis. 3. No clear evidence of activity at the vaginal cuff.   05/15/2019 Procedure   Placement of a subcutaneous port device. Catheter tip in the lower  SVC   05/16/2019 - 06/12/2019 Chemotherapy   The patient had cisplatin for chemotherapy treatment.      Genetic Testing   Patient has genetic testing done for MSI. Results revealed patient has the following: MSI Stable   06/23/2019 Imaging   1. Interval decreased size of right inguinal and external iliac lymph nodes. 2. No evidence of progressive metastatic disease. 3. Stable small low-density hepatic lesions, likely cysts. 4. Aortic Atherosclerosis (ICD10-I70.0).     07/14/2019 -  Chemotherapy   The patient had everolimus and letrozole for chemotherapy treatment.     10/20/2019 Imaging   1. No acute abdominal/pelvic findings, mass lesions or adenopathy. 2. Interval resolution of right iliac lymph node. No residual measurable lymph node is identified. Decrease in size of the right inguinal node. 3. Stable scattered hepatic cysts.   Aortic Atherosclerosis (ICD10-I70.0).   03/08/2020 Imaging   No evidence of recurrent or metastatic carcinoma within the abdomen or pelvis.   Colonic diverticulosis. No radiographic evidence of diverticulitis.   Stable small epigastric ventral hernia containing only fat.       REVIEW OF SYSTEMS:   Constitutional: Denies fevers, chills or abnormal weight loss Eyes: Denies blurriness of vision Ears, nose, mouth, throat, and face: Denies mucositis or sore throat Respiratory: Denies cough, dyspnea or wheezes Cardiovascular: Denies palpitation, chest discomfort or lower extremity swelling Skin: Denies abnormal skin rashes Lymphatics: Denies new lymphadenopathy or easy bruising Behavioral/Psych: Mood is stable, no  new changes  All other systems were reviewed with the patient and are negative.  I have reviewed the past medical history, past surgical history, social history and family history with the patient and they are unchanged from previous note.  ALLERGIES:  is allergic to oxycodone, garlic, other, and penicillins.  MEDICATIONS:  Current  Outpatient Medications  Medication Sig Dispense Refill  . amLODipine (NORVASC) 5 MG tablet Take 1 tablet (5 mg total) by mouth daily. 90 tablet 11  . diphenoxylate-atropine (LOMOTIL) 2.5-0.025 MG tablet Take 1 tablet by mouth 4 (four) times daily as needed for diarrhea or loose stools. 90 tablet 0  . gabapentin (NEURONTIN) 300 MG capsule Take 1 capsule (300 mg total) by mouth 2 (two) times daily. 60 capsule 11  . ibuprofen (ADVIL) 200 MG tablet Take 200 mg by mouth every 6 (six) hours as needed.    . lidocaine-prilocaine (EMLA) cream Apply to affected area once 30 g 3  . loperamide (IMODIUM A-D) 2 MG tablet Take 4 mg by mouth 4 (four) times daily as needed.    . ondansetron (ZOFRAN) 8 MG tablet Take 1 tablet (8 mg total) by mouth every 8 (eight) hours as needed. 30 tablet 1  . prochlorperazine (COMPAZINE) 10 MG tablet Take 1 tablet (10 mg total) by mouth every 6 (six) hours as needed (Nausea or vomiting). 30 tablet 1  . traMADol (ULTRAM) 50 MG tablet TK 1 T PO  BID PRF PAINS.     No current facility-administered medications for this visit.    PHYSICAL EXAMINATION: ECOG PERFORMANCE STATUS: 2 - Symptomatic, <50% confined to bed  Vitals:   04/28/20 0954  BP: 129/68  Pulse: 68  Resp: 18  Temp: 97.7 F (36.5 C)  SpO2: 99%   Filed Weights   04/28/20 0954  Weight: 162 lb 3.2 oz (73.6 kg)    GENERAL:alert, no distress and comfortable HEART: noted mild to moderate bilateral lower extremity edema, worse on the left Musculoskeletal:no cyanosis of digits and no clubbing  NEURO: alert & oriented x 3 with fluent speech, no focal motor/sensory deficits  LABORATORY DATA:  I have reviewed the data as listed    Component Value Date/Time   NA 138 04/06/2020 0827   K 4.2 04/06/2020 0827   CL 104 04/06/2020 0827   CO2 24 04/06/2020 0827   GLUCOSE 86 04/06/2020 0827   BUN 9 04/06/2020 0827   CREATININE 0.90 04/06/2020 0827   CALCIUM 9.1 04/06/2020 0827   PROT 6.6 04/06/2020 0827   ALBUMIN  3.5 04/06/2020 0827   AST 14 (L) 04/06/2020 0827   ALT 13 04/06/2020 0827   ALKPHOS 89 04/06/2020 0827   BILITOT 0.5 04/06/2020 0827   GFRNONAA >60 04/06/2020 0827   GFRAA >60 04/06/2020 0827    No results found for: SPEP, UPEP  Lab Results  Component Value Date   WBC 3.8 (L) 04/06/2020   NEUTROABS 2.3 04/06/2020   HGB 13.4 04/06/2020   HCT 41.3 04/06/2020   MCV 87.9 04/06/2020   PLT 218 04/06/2020      Chemistry      Component Value Date/Time   NA 138 04/06/2020 0827   K 4.2 04/06/2020 0827   CL 104 04/06/2020 0827   CO2 24 04/06/2020 0827   BUN 9 04/06/2020 0827   CREATININE 0.90 04/06/2020 0827      Component Value Date/Time   CALCIUM 9.1 04/06/2020 0827   ALKPHOS 89 04/06/2020 0827   AST 14 (L) 04/06/2020 0827   ALT  13 04/06/2020 0827   BILITOT 0.5 04/06/2020 0827

## 2020-04-28 NOTE — Assessment & Plan Note (Signed)
She continues to have intermittent nausea and vomiting She will continue antiemetics as needed

## 2020-04-28 NOTE — Assessment & Plan Note (Signed)
She continues to have severe chronic diarrhea She does not think it is food related although her symptoms are worse after eating I recommend a food diary I recommend her to continue Imodium and Lomotil We discussed the role of GI follow-up but she would like to hold off for now

## 2020-04-28 NOTE — Assessment & Plan Note (Signed)
Despite stopping recent chemotherapy, she continues to experience multiple side effects from treatment and completely debilitated She cannot return back to work She is scheduled for CT imaging next month She is at high risk of cancer recurrence

## 2020-04-28 NOTE — Assessment & Plan Note (Signed)
She has chronic lymphedema due to prior treatment She is at risk of recurrent blood clot She is debilitated due to her lymphedema that affects her mobility

## 2020-04-28 NOTE — Assessment & Plan Note (Signed)
She is immunocompromise due to recent treatment I recommend she does not return back to work and minimize contact We discussed hand hygiene and neutropenic precaution

## 2020-04-28 NOTE — Assessment & Plan Note (Signed)
She is generalized weak due to multiple side effects from treatment I do not recommend her to return back to work I spent some time today completing the form for work to support her need for disability status

## 2020-05-18 DIAGNOSIS — R69 Illness, unspecified: Secondary | ICD-10-CM | POA: Diagnosis not present

## 2020-05-31 ENCOUNTER — Encounter (HOSPITAL_COMMUNITY): Payer: Self-pay

## 2020-05-31 ENCOUNTER — Ambulatory Visit (HOSPITAL_COMMUNITY)
Admission: RE | Admit: 2020-05-31 | Discharge: 2020-05-31 | Disposition: A | Discharge status: Home / Self Care | Payer: Medicare HMO | Source: Ambulatory Visit | Attending: Hematology and Oncology | Admitting: Hematology and Oncology

## 2020-05-31 ENCOUNTER — Other Ambulatory Visit: Payer: Self-pay

## 2020-05-31 ENCOUNTER — Inpatient Hospital Stay: Payer: Medicare HMO | Attending: Gynecologic Oncology

## 2020-05-31 ENCOUNTER — Inpatient Hospital Stay: Payer: Medicare HMO

## 2020-05-31 DIAGNOSIS — I700 Atherosclerosis of aorta: Secondary | ICD-10-CM | POA: Diagnosis not present

## 2020-05-31 DIAGNOSIS — C55 Malignant neoplasm of uterus, part unspecified: Secondary | ICD-10-CM

## 2020-05-31 DIAGNOSIS — C541 Malignant neoplasm of endometrium: Secondary | ICD-10-CM | POA: Insufficient documentation

## 2020-05-31 DIAGNOSIS — I890 Lymphedema, not elsewhere classified: Secondary | ICD-10-CM | POA: Diagnosis not present

## 2020-05-31 DIAGNOSIS — Z79899 Other long term (current) drug therapy: Secondary | ICD-10-CM | POA: Insufficient documentation

## 2020-05-31 DIAGNOSIS — R197 Diarrhea, unspecified: Secondary | ICD-10-CM | POA: Diagnosis not present

## 2020-05-31 DIAGNOSIS — N281 Cyst of kidney, acquired: Secondary | ICD-10-CM | POA: Diagnosis not present

## 2020-05-31 DIAGNOSIS — K7689 Other specified diseases of liver: Secondary | ICD-10-CM | POA: Insufficient documentation

## 2020-05-31 DIAGNOSIS — K449 Diaphragmatic hernia without obstruction or gangrene: Secondary | ICD-10-CM | POA: Diagnosis not present

## 2020-05-31 LAB — CBC WITH DIFFERENTIAL/PLATELET
Abs Immature Granulocytes: 0 10*3/uL (ref 0.00–0.07)
Basophils Absolute: 0 10*3/uL (ref 0.0–0.1)
Basophils Relative: 1 %
Eosinophils Absolute: 0.1 10*3/uL (ref 0.0–0.5)
Eosinophils Relative: 3 %
HCT: 41.9 % (ref 36.0–46.0)
Hemoglobin: 13.8 g/dL (ref 12.0–15.0)
Immature Granulocytes: 0 %
Lymphocytes Relative: 21 %
Lymphs Abs: 0.9 10*3/uL (ref 0.7–4.0)
MCH: 29.8 pg (ref 26.0–34.0)
MCHC: 32.9 g/dL (ref 30.0–36.0)
MCV: 90.5 fL (ref 80.0–100.0)
Monocytes Absolute: 0.5 10*3/uL (ref 0.1–1.0)
Monocytes Relative: 11 %
Neutro Abs: 2.8 10*3/uL (ref 1.7–7.7)
Neutrophils Relative %: 64 %
Platelets: 228 10*3/uL (ref 150–400)
RBC: 4.63 MIL/uL (ref 3.87–5.11)
RDW: 14.6 % (ref 11.5–15.5)
WBC: 4.4 10*3/uL (ref 4.0–10.5)
nRBC: 0 % (ref 0.0–0.2)

## 2020-05-31 LAB — COMPREHENSIVE METABOLIC PANEL
ALT: 10 U/L (ref 0–44)
AST: 15 U/L (ref 15–41)
Albumin: 3.5 g/dL (ref 3.5–5.0)
Alkaline Phosphatase: 82 U/L (ref 38–126)
Anion gap: 5 (ref 5–15)
BUN: 9 mg/dL (ref 8–23)
CO2: 27 mmol/L (ref 22–32)
Calcium: 9 mg/dL (ref 8.9–10.3)
Chloride: 107 mmol/L (ref 98–111)
Creatinine, Ser: 0.83 mg/dL (ref 0.44–1.00)
GFR, Estimated: >60 mL/min (ref >60)
Glucose, Bld: 91 mg/dL (ref 70–99)
Potassium: 4 mmol/L (ref 3.5–5.1)
Sodium: 139 mmol/L (ref 135–145)
Total Bilirubin: 0.6 mg/dL (ref 0.3–1.2)
Total Protein: 6.5 g/dL (ref 6.5–8.1)

## 2020-05-31 MED ORDER — IOHEXOL 9 MG/ML PO SOLN
500.0000 mL | ORAL | Status: AC
  Administered 2020-05-31: 1000 mL via ORAL

## 2020-05-31 MED ORDER — HEPARIN SOD (PORK) LOCK FLUSH 100 UNIT/ML IV SOLN
500.0000 [IU] | Freq: Once | INTRAVENOUS | Status: AC
  Administered 2020-05-31: 500 [IU] via INTRAVENOUS

## 2020-05-31 MED ORDER — IOHEXOL 300 MG/ML  SOLN
100.0000 mL | Freq: Once | INTRAMUSCULAR | Status: AC | PRN
  Administered 2020-05-31: 100 mL via INTRAVENOUS

## 2020-05-31 MED ORDER — IOHEXOL 9 MG/ML PO SOLN
ORAL | Status: AC
  Filled 2020-05-31: qty 1000

## 2020-05-31 MED ORDER — HEPARIN SOD (PORK) LOCK FLUSH 100 UNIT/ML IV SOLN
INTRAVENOUS | Status: AC
  Filled 2020-05-31: qty 5

## 2020-05-31 MED ORDER — SODIUM CHLORIDE 0.9% FLUSH
10.0000 mL | Freq: Once | INTRAVENOUS | Status: AC
  Administered 2020-05-31: 10 mL
  Filled 2020-05-31: qty 10

## 2020-06-01 ENCOUNTER — Encounter: Payer: Self-pay | Admitting: Hematology and Oncology

## 2020-06-01 ENCOUNTER — Inpatient Hospital Stay: Payer: Medicare HMO | Admitting: Hematology and Oncology

## 2020-06-01 ENCOUNTER — Other Ambulatory Visit: Payer: Self-pay

## 2020-06-01 DIAGNOSIS — R197 Diarrhea, unspecified: Secondary | ICD-10-CM | POA: Diagnosis not present

## 2020-06-01 DIAGNOSIS — Z7189 Other specified counseling: Secondary | ICD-10-CM | POA: Diagnosis not present

## 2020-06-01 DIAGNOSIS — C55 Malignant neoplasm of uterus, part unspecified: Secondary | ICD-10-CM

## 2020-06-01 DIAGNOSIS — I890 Lymphedema, not elsewhere classified: Secondary | ICD-10-CM | POA: Diagnosis not present

## 2020-06-01 DIAGNOSIS — C541 Malignant neoplasm of endometrium: Secondary | ICD-10-CM | POA: Diagnosis not present

## 2020-06-01 MED ORDER — PROCHLORPERAZINE MALEATE 10 MG PO TABS: 10.0000 mg | ORAL_TABLET | Freq: Four times a day (QID) | ORAL | 1 refills | Status: DC | PRN

## 2020-06-01 MED ORDER — DIPHENOXYLATE-ATROPINE 2.5-0.025 MG PO TABS: 1.0000 | ORAL_TABLET | Freq: Four times a day (QID) | ORAL | 0 refills | Status: DC | PRN

## 2020-06-01 NOTE — Assessment & Plan Note (Signed)
She has chronic lymphedema due to prior treatment She is debilitated due to her lymphedema that affects her mobility Previously, we have discussed physical therapy but she will hold off on it

## 2020-06-01 NOTE — Progress Notes (Signed)
[facility] OFFICE PROGRESS NOTE  Patient Care Team: Shon Baton, MD as PCP - General (Internal Medicine) Awanda Mink Craige Cotta, RN as Oncology Nurse Navigator (Oncology)  ASSESSMENT & PLAN:  Endometrioid adenocarcinoma of uterus [facility]) Despite stopping recent chemotherapy, she continues to experience multiple side effects from treatment and completely debilitated She cannot return back to work Thankfully, her blood work and CT imaging from yesterday showed no evidence of disease I recommend follow-up with GYN surgeon in 3 months and I will see her back in 6 months for repeat blood work and imaging studies We discussed the risk and benefits of port removal versus maintenance with port flush every other month She is undecided We will call her next month for further plan of care  Diarrhea She continues to have chronic diarrhea, cause unknown but could be due to radiation induced bowel issues She does not think it is food related although her symptoms are worse after eating I refill her prescription for Lomotil  Lymphedema She has chronic lymphedema due to prior treatment She is debilitated due to her lymphedema that affects her mobility Previously, we have discussed physical therapy but she will hold off on it   Orders Placed This Encounter  Procedures  . CT ABDOMEN PELVIS W CONTRAST    Standing Status:   Future    Standing Expiration Date:   06/01/2021    Order Specific Question:   If indicated for the ordered procedure, I authorize the administration of contrast media per Radiology protocol    Answer:   Yes    Order Specific Question:   Preferred imaging location?    Answer:   [facility]    Order Specific Question:   Radiology Contrast Protocol - do NOT remove file path    Answer:   [email or web address]    All questions were answered. The patient knows to call the clinic with any problems, questions or concerns. The total  time spent in the appointment was 20 minutes encounter with patients including review of chart and various tests results, discussions about plan of care and coordination of care plan   [name], MD 06/01/2020 8:21 AM  INTERVAL HISTORY: Please see below for problem oriented charting. 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  The patient's initial surgery was with Dr. Marti Sleigh in [place] on January 05, 2014.  It included an exploratory laparotomy with TAH, BSO, appendectomy, pelvic and para-aortic lymphadenectomy and wedge resection of a concerning appearing liver lesion.  Preoperatively she presented with a 26 cm right adnexal mass and an elevated Ca1 25 at 404 units/mL.  At the completion of surgery there was no gross residual disease.  Final pathology showed a primary grade 3 endometrial adenocarcinoma (endometrioid) involving both ovaries.  Lymph nodes and other biopsies were negative.  She had uncomplicated postoperative course.  She was treated with 6 cycles of carboplatin paclitaxel chemotherapy.  She was recommended adjuvant radiation therapy however declined this after counseling.  Following her initial therapy she developed severe left lower extremity lymphedema.   Follow-up CT scan in March 2016 showed no evidence  of disease.  She was followed every 3 months for a total of 5 years with no recurrence appreciated during that time   03/28/2019 Imaging   Ct abdomen and pelvis 1. Enlarged right inguinal lymph node or soft tissue mass, measuring 3.8 x 3.5 cm (series 2, image 69).  Additional enlarged right iliac lymph node measuring 1.9 x 1.1 cm (series 2, image 68). Findings are concerning for malignancy.   2. Status post hysterectomy per stated history of endometrial malignancy.   3. No other findings in the abdomen or pelvis concerning for malignancy or metastatic disease.   4. There are multiple subcentimeter low-attenuation lesions of the liver, the largest in the left lobe the measuring 7 mm, too small to characterize (series 2, image 24). Attention on follow-up although these are most likely incidental benign small cysts and/or hemangiomata.   5.  Aortic atherosclerosis.   04/10/2019 Pathology Results   A. LYMPH NODE, RIGHT INGUINAL, NEEDLE CORE BIOPSY:  - Metastatic adenocarcinoma.  - See comment.   COMMENT:   The morphology is most consistent with metastatic endometrioid adenocarcinoma.  Immunohistochemistry can be performed if clinically indicated.  There is sufficient tissue for additional testing   04/10/2019 Procedure   Technically successful ultrasound guided biopsy of dominant right inguinal lymph node   04/22/2019 Cancer Staging   Staging form: Corpus Uteri - Carcinoma and Carcinosarcoma, AJCC 8th Edition - Pathologic stage from 04/22/2019: FIGO Stage IVB (rpT3a, pN0, pM1) - Signed by [name], MD on 04/22/2019   04/22/2019 PET scan   1. Two hypermetabolic RIGHT iliac lymph nodes and larger metastatic RIGHT inguinal lymph node. 2. No evidence of metastatic disease outside the pelvis. 3. No clear evidence of activity at the vaginal cuff.   05/15/2019 Procedure   Placement of a subcutaneous port device. Catheter tip in the lower SVC   05/16/2019 - 06/12/2019 Chemotherapy   The patient had cisplatin for chemotherapy treatment.      Genetic Testing   Patient has genetic testing done for MSI. Results revealed patient has the following: MSI Stable   06/23/2019 Imaging   1. Interval decreased size of right inguinal and external iliac lymph  nodes. 2. No evidence of progressive metastatic disease. 3. Stable small low-density hepatic lesions, likely cysts. 4. Aortic Atherosclerosis (ICD10-I70.0).     07/14/2019 - 04/06/2020 Chemotherapy   The patient had everolimus and letrozole for chemotherapy treatment.  Treatment was discontinued due to significant side effects    10/20/2019 Imaging   1. No acute abdominal/pelvic findings, mass lesions or adenopathy. 2. Interval resolution of right iliac lymph node. No residual measurable lymph node is identified. Decrease in size of the right inguinal node. 3. Stable scattered hepatic cysts.   Aortic Atherosclerosis (ICD10-I70.0).   03/08/2020 Imaging   No evidence of recurrent or metastatic carcinoma within the abdomen or pelvis.   Colonic diverticulosis. No radiographic evidence of diverticulitis.   Stable small epigastric ventral hernia containing only fat.     05/31/2020 Imaging   Status post hysterectomy and suspected bilateral salpingo-oophorectomy.   No evidence of recurrent or metastatic disease.     REVIEW OF SYSTEMS:   Constitutional: Denies fevers, chills or abnormal weight loss Eyes: Denies blurriness of vision Ears, nose, mouth, throat, and face: Denies mucositis or sore throat Respiratory: Denies cough, dyspnea or wheezes Skin: Denies abnormal skin rashes Lymphatics: Denies new lymphadenopathy or easy bruising Neurological:Denies numbness, tingling or new weaknesses Behavioral/Psych: Mood is stable, no new changes  All other systems were  reviewed with the patient and are negative.  I have reviewed the past medical history, past surgical history, social history and family history with the patient and they are unchanged from previous note.  ALLERGIES:  is allergic to oxycodone, garlic, other, and penicillins.  MEDICATIONS:  Current Outpatient Medications  Medication Sig Dispense Refill  . amLODipine (NORVASC) 5 MG tablet Take 1 tablet (5 mg total) by mouth  daily. 90 tablet 11  . diphenoxylate-atropine (LOMOTIL) 2.5-0.025 MG tablet Take 1 tablet by mouth 4 (four) times daily as needed for diarrhea or loose stools. 90 tablet 0  . gabapentin (NEURONTIN) 300 MG capsule Take 1 capsule (300 mg total) by mouth 2 (two) times daily. 60 capsule 11  . ibuprofen (ADVIL) 200 MG tablet Take 200 mg by mouth every 6 (six) hours as needed.    . lidocaine-prilocaine (EMLA) cream Apply to affected area once 30 g 3  . loperamide (IMODIUM A-D) 2 MG tablet Take 4 mg by mouth 4 (four) times daily as needed.    . ondansetron (ZOFRAN) 8 MG tablet Take 1 tablet (8 mg total) by mouth every 8 (eight) hours as needed. 30 tablet 1  . prochlorperazine (COMPAZINE) 10 MG tablet Take 1 tablet (10 mg total) by mouth every 6 (six) hours as needed (Nausea or vomiting). 60 tablet 1  . traMADol (ULTRAM) 50 MG tablet TK 1 T PO  BID PRF PAINS.     No current facility-administered medications for this visit.    PHYSICAL EXAMINATION: ECOG PERFORMANCE STATUS: 1 - Symptomatic but completely ambulatory  Vitals:   06/01/20 0807  BP: 124/75  Pulse: 68  Resp: 18  Temp: (!) 97.4 F (36.3 C)  SpO2: 99%   Filed Weights   06/01/20 0807  Weight: 167 lb 12.8 oz (76.1 kg)    GENERAL:alert, no distress and comfortable NEURO: alert & oriented x 3 with fluent speech, no focal motor/sensory deficits  LABORATORY DATA:  I have reviewed the data as listed    Component Value Date/Time   NA 139 05/31/2020 0849   K 4.0 05/31/2020 0849   CL 107 05/31/2020 0849   CO2 27 05/31/2020 0849   GLUCOSE 91 05/31/2020 0849   BUN 9 05/31/2020 0849   CREATININE 0.83 05/31/2020 0849   CALCIUM 9.0 05/31/2020 0849   PROT 6.5 05/31/2020 0849   ALBUMIN 3.5 05/31/2020 0849   AST 15 05/31/2020 0849   ALT 10 05/31/2020 0849   ALKPHOS 82 05/31/2020 0849   BILITOT 0.6 05/31/2020 0849   GFRNONAA >60 05/31/2020 0849   GFRAA >60 04/06/2020 0827    No results found for: SPEP, UPEP  Lab Results   Component Value Date   WBC 4.4 05/31/2020   NEUTROABS 2.8 05/31/2020   HGB 13.8 05/31/2020   HCT 41.9 05/31/2020   MCV 90.5 05/31/2020   PLT 228 05/31/2020      Chemistry      Component Value Date/Time   NA 139 05/31/2020 0849   K 4.0 05/31/2020 0849   CL 107 05/31/2020 0849   CO2 27 05/31/2020 0849   BUN 9 05/31/2020 0849   CREATININE 0.83 05/31/2020 0849      Component Value Date/Time   CALCIUM 9.0 05/31/2020 0849   ALKPHOS 82 05/31/2020 0849   AST 15 05/31/2020 0849   ALT 10 05/31/2020 0849   BILITOT 0.6 05/31/2020 0849       RADIOGRAPHIC STUDIES: I have personally reviewed the radiological images as listed and agreed with the findings  in the report. CT ABDOMEN PELVIS W CONTRAST  Result Date: 05/31/2020 CLINICAL DATA:  Follow-up endometrial cancer EXAM: CT ABDOMEN AND PELVIS WITH CONTRAST TECHNIQUE: Multidetector CT imaging of the abdomen and pelvis was performed using the standard protocol following bolus administration of intravenous contrast. CONTRAST:  165m OMNIPAQUE IOHEXOL 300 MG/ML  SOLN COMPARISON:  03/08/2020 FINDINGS: Lower chest: Lung bases are clear. Hepatobiliary: Two small hepatic cysts measuring up to 10 mm, unchanged. Gallbladder is unremarkable. No intrahepatic or extrahepatic ductal dilatation. Pancreas: Within normal limits. Spleen: Within normal limits. Adrenals/Urinary Tract: Adrenal glands are within normal limits. Left renal sinus cysts. Right kidney is within normal limits. No hydronephrosis. Bladder is partially obscured by streak artifact but grossly unremarkable. Stomach/Bowel: Stomach is notable for a small hiatal hernia. No evidence of bowel obstruction. Prior appendectomy. Mild sigmoid diverticulosis, without evidence of diverticulitis. Vascular/Lymphatic: No evidence of abdominal aortic aneurysm. Atherosclerotic calcifications of the abdominal aorta and branch vessels. No suspicious abdominopelvic lymphadenopathy. Reproductive: Status post  hysterectomy. Suspected bilateral salpingo-oophorectomy. No adnexal masses. Other: No abdominopelvic ascites. No peritoneal disease or frank mental caking. Musculoskeletal: Mild degenerative changes of the visualized thoracolumbar spine. Right hip arthroplasty, without evidence of complication. No focal osseous lesions. IMPRESSION: Status post hysterectomy and suspected bilateral salpingo-oophorectomy. No evidence of recurrent or metastatic disease. Electronically Signed   By: SJulian HyM.D.   On: 05/31/2020 12:32

## 2020-06-01 NOTE — Assessment & Plan Note (Addendum)
She continues to have chronic diarrhea, cause unknown but could be due to radiation induced bowel issues She does not think it is food related although her symptoms are worse after eating I refill her prescription for Lomotil

## 2020-06-01 NOTE — Assessment & Plan Note (Signed)
Despite stopping recent chemotherapy, she continues to experience multiple side effects from treatment and completely debilitated She cannot return back to work Haematologist, her blood work and CT imaging from yesterday showed no evidence of disease I recommend follow-up with GYN surgeon in 3 months and I will see her back in 6 months for repeat blood work and imaging studies We discussed the risk and benefits of port removal versus maintenance with port flush every other month She is undecided We will call her next month for further plan of care

## 2020-06-14 ENCOUNTER — Telehealth: Payer: Self-pay | Admitting: Oncology

## 2020-06-14 ENCOUNTER — Other Ambulatory Visit: Payer: Self-pay | Admitting: Hematology and Oncology

## 2020-06-14 DIAGNOSIS — C774 Secondary and unspecified malignant neoplasm of inguinal and lower limb lymph nodes: Secondary | ICD-10-CM

## 2020-06-14 NOTE — Telephone Encounter (Signed)
Dana [name] called and said she is ready to have her port removed.   Advised her that I will notify Dr. Alvy Bimler and then the interventional radiology scheduler will call her with an appointment.

## 2020-06-14 NOTE — Telephone Encounter (Signed)
I palced order for removal

## 2020-06-21 ENCOUNTER — Other Ambulatory Visit: Payer: Self-pay | Admitting: Radiology

## 2020-06-22 ENCOUNTER — Other Ambulatory Visit: Payer: Self-pay | Admitting: Radiology

## 2020-06-23 ENCOUNTER — Ambulatory Visit (HOSPITAL_COMMUNITY)
Admission: RE | Admit: 2020-06-23 | Discharge: 2020-06-23 | Disposition: A | Discharge status: Home / Self Care | Payer: Medicare HMO | Source: Ambulatory Visit | Attending: Hematology and Oncology | Admitting: Hematology and Oncology

## 2020-06-23 ENCOUNTER — Other Ambulatory Visit: Payer: Self-pay

## 2020-06-23 ENCOUNTER — Encounter (HOSPITAL_COMMUNITY): Payer: Self-pay

## 2020-06-23 DIAGNOSIS — Z452 Encounter for adjustment and management of vascular access device: Secondary | ICD-10-CM | POA: Insufficient documentation

## 2020-06-23 DIAGNOSIS — C774 Secondary and unspecified malignant neoplasm of inguinal and lower limb lymph nodes: Secondary | ICD-10-CM

## 2020-06-23 DIAGNOSIS — C55 Malignant neoplasm of uterus, part unspecified: Secondary | ICD-10-CM | POA: Diagnosis not present

## 2020-06-23 HISTORY — PX: IR REMOVAL TUN ACCESS W/ PORT W/O FL MOD SED: IMG2290

## 2020-06-23 MED ORDER — SODIUM CHLORIDE 0.9 % IV SOLN: INTRAVENOUS | Status: DC

## 2020-06-23 MED ORDER — CLINDAMYCIN PHOSPHATE 900 MG/50ML IV SOLN: 900.0000 mg | Freq: Once | INTRAVENOUS | Status: DC

## 2020-06-23 MED ORDER — CLINDAMYCIN PHOSPHATE 900 MG/50ML IV SOLN
INTRAVENOUS | Status: AC
  Administered 2020-06-23: 900 mg
  Filled 2020-06-23: qty 50

## 2020-06-23 MED ORDER — LIDOCAINE-EPINEPHRINE 1 %-1:100000 IJ SOLN
INTRAMUSCULAR | Status: AC
  Administered 2020-06-23: 10 mL
  Filled 2020-06-23: qty 1

## 2020-06-23 NOTE — Discharge Instructions (Signed)
Please call Interventional Radiology clinic [phone] with any questions or concerns.  You may remove your dressing and shower tomorrow.   Implanted Port Removal, Care After This sheet gives you information about how to care for yourself after your procedure. Your health care provider may also give you more specific instructions. If you have problems or questions, contact your health care provider. What can I expect after the procedure? After the procedure, it is common to have:  Soreness or pain near your incision.  Some swelling or bruising near your incision. Follow these instructions at home: Medicines  Take over-the-counter and prescription medicines only as told by your health care provider.  If you were prescribed an antibiotic medicine, take it as told by your health care provider. Do not stop taking the antibiotic even if you start to feel better. Bathing  Do not take baths, swim, or use a hot tub until your health care provider approves. Ask your health care provider if you can take showers. You may only be allowed to take sponge baths. Incision care   Follow instructions from your health care provider about how to take care of your incision. Make sure you: ? Wash your hands with soap and water before you change your bandage (dressing). If soap and water are not available, use hand sanitizer. ? Change your dressing as told by your health care provider. ? Keep your dressing dry. ? Leave stitches (sutures), skin glue, or adhesive strips in place. These skin closures may need to stay in place for 2 weeks or longer. If adhesive strip edges start to loosen and curl up, you may trim the loose edges. Do not remove adhesive strips completely unless your health care provider tells you to do that.  Check your incision area every day for signs of infection. Check for: ? More redness, swelling, or pain. ? More fluid or blood. ? Warmth. ? Pus or a bad smell. Driving   Do not  drive for 24 hours if you were given a medicine to help you relax (sedative) during your procedure.  If you did not receive a sedative, ask your health care provider when it is safe to drive. Activity  Return to your normal activities as told by your health care provider. Ask your health care provider what activities are safe for you.  Do not lift anything that is heavier than 10 lb (4.5 kg), or the limit that you are told, until your health care provider says that it is safe.  Do not do activities that involve lifting your arms over your head. General instructions  Do not use any products that contain nicotine or tobacco, such as cigarettes and e-cigarettes. These can delay healing. If you need help quitting, ask your health care provider.  Keep all follow-up visits as told by your health care provider. This is important. Contact a health care provider if:  You have more redness, swelling, or pain around your incision.  You have more fluid or blood coming from your incision.  Your incision feels warm to the touch.  You have pus or a bad smell coming from your incision.  You have pain that is not relieved by your pain medicine. Get help right away if you have:  A fever or chills.  Chest pain.  Difficulty breathing. Summary  After the procedure, it is common to have pain, soreness, swelling, or bruising near your incision.  If you were prescribed an antibiotic medicine, take it as told by your health   care provider. Do not stop taking the antibiotic even if you start to feel better.  Do not drive for 24 hours if you were given a sedative during your procedure.  Return to your normal activities as told by your health care provider. Ask your health care provider what activities are safe for you. This information is not intended to replace advice given to you by your health care provider. Make sure you discuss any questions you have with your health care provider. Document  Revised: 08/16/2017 Document Reviewed: 08/16/2017 Elsevier Patient Education  2020 [employer]  

## 2020-06-23 NOTE — Procedures (Signed)
Interventional Radiology Procedure:   Indications: Endometrioid adenocarcinoma of uterus and completed treatment.  Procedure: Port removal  Findings: Complete removal of right chest port.  Complications: None     EBL: less than 10 ml  Plan: Keep incision covered for 24 hours.     Dana [name] R. Anselm Pancoast, MD  Pager: [phone]

## 2020-07-01 ENCOUNTER — Telehealth: Payer: Self-pay | Admitting: *Deleted

## 2020-07-01 NOTE — Telephone Encounter (Signed)
Called and spoke with the patient, scheduled the patient for a follow up appt in February

## 2020-09-01 ENCOUNTER — Telehealth: Payer: Self-pay

## 2020-09-01 NOTE — Telephone Encounter (Signed)
Ms Culliton call to cancel her appointment on 09-06-20 with Dr. Denman George.  She currently has a busy schedule and will call in March to reschedule the appointment. Appointment canceled per patient's request.

## 2020-09-06 ENCOUNTER — Ambulatory Visit: Payer: Medicare HMO | Admitting: Gynecologic Oncology

## 2020-09-27 ENCOUNTER — Telehealth: Payer: Self-pay

## 2020-09-27 NOTE — Telephone Encounter (Signed)
-----   Message from [name], MD sent at 09/27/2020  9:11 AM EDT ----- Regarding: schedule CT for 5/16 Pls call her and ask her to call to scehdule CT for 5/16

## 2020-09-27 NOTE — Telephone Encounter (Signed)
Called and given below message. She verbalzied understanding. 

## 2020-11-25 ENCOUNTER — Other Ambulatory Visit: Payer: Self-pay | Admitting: Hematology and Oncology

## 2020-11-25 DIAGNOSIS — C774 Secondary and unspecified malignant neoplasm of inguinal and lower limb lymph nodes: Secondary | ICD-10-CM

## 2020-11-25 DIAGNOSIS — C55 Malignant neoplasm of uterus, part unspecified: Secondary | ICD-10-CM

## 2020-11-29 ENCOUNTER — Telehealth: Payer: Self-pay | Admitting: Hematology and Oncology

## 2020-11-29 ENCOUNTER — Other Ambulatory Visit: Payer: Self-pay

## 2020-11-29 ENCOUNTER — Inpatient Hospital Stay: Payer: Medicare HMO | Attending: Hematology and Oncology

## 2020-11-29 ENCOUNTER — Ambulatory Visit (HOSPITAL_BASED_OUTPATIENT_CLINIC_OR_DEPARTMENT_OTHER): Payer: Medicare HMO

## 2020-11-29 ENCOUNTER — Inpatient Hospital Stay: Payer: Medicare HMO

## 2020-11-29 ENCOUNTER — Inpatient Hospital Stay (HOSPITAL_BASED_OUTPATIENT_CLINIC_OR_DEPARTMENT_OTHER): Payer: Medicare HMO | Admitting: Hematology and Oncology

## 2020-11-29 ENCOUNTER — Encounter: Payer: Self-pay | Admitting: Hematology and Oncology

## 2020-11-29 DIAGNOSIS — Z90722 Acquired absence of ovaries, bilateral: Secondary | ICD-10-CM | POA: Insufficient documentation

## 2020-11-29 DIAGNOSIS — Z880 Allergy status to penicillin: Secondary | ICD-10-CM | POA: Insufficient documentation

## 2020-11-29 DIAGNOSIS — I890 Lymphedema, not elsewhere classified: Secondary | ICD-10-CM | POA: Insufficient documentation

## 2020-11-29 DIAGNOSIS — L03311 Cellulitis of abdominal wall: Secondary | ICD-10-CM

## 2020-11-29 DIAGNOSIS — R5383 Other fatigue: Secondary | ICD-10-CM

## 2020-11-29 DIAGNOSIS — Z9079 Acquired absence of other genital organ(s): Secondary | ICD-10-CM | POA: Insufficient documentation

## 2020-11-29 DIAGNOSIS — Z8542 Personal history of malignant neoplasm of other parts of uterus: Secondary | ICD-10-CM | POA: Diagnosis not present

## 2020-11-29 DIAGNOSIS — R197 Diarrhea, unspecified: Secondary | ICD-10-CM | POA: Diagnosis not present

## 2020-11-29 DIAGNOSIS — Z90710 Acquired absence of both cervix and uterus: Secondary | ICD-10-CM | POA: Insufficient documentation

## 2020-11-29 DIAGNOSIS — C55 Malignant neoplasm of uterus, part unspecified: Secondary | ICD-10-CM

## 2020-11-29 DIAGNOSIS — Z9221 Personal history of antineoplastic chemotherapy: Secondary | ICD-10-CM | POA: Diagnosis not present

## 2020-11-29 LAB — CBC WITH DIFFERENTIAL/PLATELET
Abs Immature Granulocytes: 0.01 10*3/uL (ref 0.00–0.07)
Basophils Absolute: 0 10*3/uL (ref 0.0–0.1)
Basophils Relative: 1 %
Eosinophils Absolute: 0.1 10*3/uL (ref 0.0–0.5)
Eosinophils Relative: 2 %
HCT: 42.3 % (ref 36.0–46.0)
Hemoglobin: 14 g/dL (ref 12.0–15.0)
Immature Granulocytes: 0 %
Lymphocytes Relative: 25 %
Lymphs Abs: 1.1 10*3/uL (ref 0.7–4.0)
MCH: 30.4 pg (ref 26.0–34.0)
MCHC: 33.1 g/dL (ref 30.0–36.0)
MCV: 92 fL (ref 80.0–100.0)
Monocytes Absolute: 0.4 10*3/uL (ref 0.1–1.0)
Monocytes Relative: 10 %
Neutro Abs: 2.6 10*3/uL (ref 1.7–7.7)
Neutrophils Relative %: 62 %
Platelets: 237 10*3/uL (ref 150–400)
RBC: 4.6 MIL/uL (ref 3.87–5.11)
RDW: 12.8 % (ref 11.5–15.5)
WBC: 4.2 10*3/uL (ref 4.0–10.5)
nRBC: 0 % (ref 0.0–0.2)

## 2020-11-29 LAB — COMPREHENSIVE METABOLIC PANEL
ALT: 9 U/L (ref 0–44)
AST: 16 U/L (ref 15–41)
Albumin: 3.6 g/dL (ref 3.5–5.0)
Alkaline Phosphatase: 88 U/L (ref 38–126)
Anion gap: 9 (ref 5–15)
BUN: 12 mg/dL (ref 8–23)
CO2: 28 mmol/L (ref 22–32)
Calcium: 8.9 mg/dL (ref 8.9–10.3)
Chloride: 102 mmol/L (ref 98–111)
Creatinine, Ser: 0.95 mg/dL (ref 0.44–1.00)
GFR, Estimated: >60 mL/min (ref >60)
Glucose, Bld: 89 mg/dL (ref 70–99)
Potassium: 4.1 mmol/L (ref 3.5–5.1)
Sodium: 139 mmol/L (ref 135–145)
Total Bilirubin: 0.5 mg/dL (ref 0.3–1.2)
Total Protein: 6.7 g/dL (ref 6.5–8.1)

## 2020-11-29 MED ORDER — DIPHENOXYLATE-ATROPINE 2.5-0.025 MG PO TABS: 1.0000 | ORAL_TABLET | Freq: Four times a day (QID) | ORAL | 0 refills | Status: DC | PRN

## 2020-11-29 MED ORDER — DOXYCYCLINE HYCLATE 100 MG PO TABS: 100.0000 mg | ORAL_TABLET | Freq: Two times a day (BID) | ORAL | 0 refills | Status: DC

## 2020-11-29 NOTE — Assessment & Plan Note (Signed)
She have slight cellulitis on her abdominal wall Due to her penicillin allergy, I recommend short course of doxycycline I reviewed side effects with her

## 2020-11-29 NOTE — Progress Notes (Signed)
[name] OFF[facility] PROGRESS NOTE  Patient Care Team: Shon Baton, MD as PCP - General (Internal Medicine) Awanda Mink Craige Cotta, RN as Oncology Nurse Navigator (Oncology)  ASSESSMENT & PLAN:  Endometrioid adenocarcinoma of uterus [facility]) She has no clinical signs or symptoms to suggest cancer recurrence except for slightly more leg swelling on the left Previously, we discussed the risk and benefits of surveillance imaging She was supposed to get her scan done last week but it was canceled due to lack of IV contrast, and when we try to reschedule it, the patient has made decision to cancel it altogether I told the patient there is no way for me to know if her leg swelling is due to fluid retention versus cancer recurrence She will think about it and will let me know if she is interested to reschedule his CT imaging Otherwise, I will see her back in 3 months  Lymphedema She has chronic lymphedema due to prior surgery and radiation Her leg is slightly more swollen She will continue to walk as frequent as possible and to use elastic compression hose We discussed the risk and benefits of imaging  Other fatigue She continues to have excessive fatigue despite normal blood work I recommend she contact her primary care doctor for further evaluation, for possible undiagnosed obstructive sleep apnea  Diarrhea She has multifactorial cause of diarrhea She will continue to use Lomotil as needed  Cellulitis of abdominal wall She have slight cellulitis on her abdominal wall Due to her penicillin allergy, I recommend short course of doxycycline I reviewed side effects with her   No orders of the defined types were placed in this encounter.   All questions were answered. The patient knows to call the clinic with any problems, questions or concerns. The total time spent in the appointment was 25 minutes encounter with patients including review of chart and various tests results, discussions  about plan of care and coordination of care plan   [name], MD 11/29/2020 10:31 AM  INTERVAL HISTORY: Please see below for problem oriented charting. 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  The patient's initial surgery was with Dr. Marti Sleigh in [place] on January 05, 2014.  It included an exploratory laparotomy with TAH, BSO, appendectomy, pelvic and para-aortic lymphadenectomy and wedge resection of a concerning appearing liver lesion.  Preoperatively she presented with a 26 cm right adnexal mass and an elevated Ca1 25 at 404 units/mL.  At the completion of surgery there was no gross residual disease.  Final pathology showed a primary grade 3 endometrial adenocarcinoma (endometrioid) involving both ovaries.  Lymph nodes and other biopsies were negative.  She had uncomplicated postoperative course.  She was treated with 6 cycles of carboplatin paclitaxel chemotherapy.  She was recommended adjuvant radiation therapy however declined this after counseling.  Following her initial therapy she developed severe left lower extremity lymphedema.   Follow-up CT  scan in March 2016 showed no evidence of disease.  She was followed every 3 months for a total of 5 years with no recurrence appreciated during that time   03/28/2019 Imaging   Ct abdomen and pelvis 1. Enlarged right inguinal lymph node or soft tissue mass, measuring 3.8 x 3.5  cm (series 2, image 69). Additional enlarged right iliac lymph node measuring 1.9 x 1.1 cm (series 2, image 68). Findings are concerning for malignancy.   2. Status post hysterectomy per stated history of endometrial malignancy.   3. No other findings in the abdomen or pelvis concerning for malignancy or metastatic disease.   4. There are multiple subcentimeter low-attenuation lesions of the liver, the largest in the left lobe the measuring 7 mm, too small to characterize (series 2, image 24). Attention on follow-up although these are most likely incidental benign small cysts and/or hemangiomata.   5.  Aortic atherosclerosis.   04/10/2019 Pathology Results   A. LYMPH NODE, RIGHT INGUINAL, NEEDLE CORE BIOPSY:  - Metastatic adenocarcinoma.  - See comment.   COMMENT:   The morphology is most consistent with metastatic endometrioid adenocarcinoma.  Immunohistochemistry can be performed if clinically indicated.  There is sufficient tissue for additional testing   04/10/2019 Procedure   Technically successful ultrasound guided biopsy of dominant right inguinal lymph node   04/22/2019 Cancer Staging   Staging form: Corpus Uteri - Carcinoma and Carcinosarcoma, AJCC 8th Edition - Pathologic stage from 04/22/2019: FIGO Stage IVB (rpT3a, pN0, pM1) - Signed by [name], MD on 04/22/2019   04/22/2019 PET scan   1. Two hypermetabolic RIGHT iliac lymph nodes and larger metastatic RIGHT inguinal lymph node. 2. No evidence of metastatic disease outside the pelvis. 3. No clear evidence of activity at the vaginal cuff.   05/15/2019 Procedure   Placement of a subcutaneous port device. Catheter tip in the lower SVC   05/16/2019 - 06/12/2019 Chemotherapy   The patient had cisplatin for chemotherapy treatment.      Genetic Testing   Patient has genetic testing done for MSI. Results revealed patient has the following: MSI Stable   06/23/2019 Imaging   1. Interval decreased size of right inguinal  and external iliac lymph nodes. 2. No evidence of progressive metastatic disease. 3. Stable small low-density hepatic lesions, likely cysts. 4. Aortic Atherosclerosis (ICD10-I70.0).     07/14/2019 - 04/06/2020 Chemotherapy   The patient had everolimus and letrozole for chemotherapy treatment.  Treatment was discontinued due to significant side effects    10/20/2019 Imaging   1. No acute abdominal/pelvic findings, mass lesions or adenopathy. 2. Interval resolution of right iliac lymph node. No residual measurable lymph node is identified. Decrease in size of the right inguinal node. 3. Stable scattered hepatic cysts.   Aortic Atherosclerosis (ICD10-I70.0).   03/08/2020 Imaging   No evidence of recurrent or metastatic carcinoma within the abdomen or pelvis.   Colonic diverticulosis. No radiographic evidence of diverticulitis.   Stable small epigastric ventral hernia containing only fat.     05/31/2020 Imaging   Status post hysterectomy and suspected bilateral salpingo-oophorectomy.   No evidence of recurrent or metastatic disease.   06/22/2020 Procedure   Successful right IJ vein Port-A-Cath explant.       REVIEW OF SYSTEMS:   Constitutional: Denies fevers, chills or abnormal weight loss Eyes: Denies blurriness of vision Ears, nose, mouth, throat, and face: Denies mucositis or sore throat Respiratory: Denies cough, dyspnea or wheezes Cardiovascular: Denies palpitation, chest discomfort  Lymphatics: Denies new  lymphadenopathy or easy bruising Neurological:Denies numbness, tingling or new weaknesses Behavioral/Psych: Mood is stable, no new changes  All other systems were reviewed with the patient and are negative.  I have reviewed the past medical history, past surgical history, social history and family history with the patient and they are unchanged from previous note.  ALLERGIES:  is allergic to oxycodone, garlic, other, and penicillins.  MEDICATIONS:  Current Outpatient  Medications  Medication Sig Dispense Refill  . doxycycline (VIBRA-TABS) 100 MG tablet Take 1 tablet (100 mg total) by mouth 2 (two) times daily. 20 tablet 0  . amLODipine (NORVASC) 5 MG tablet Take 1 tablet (5 mg total) by mouth daily. 90 tablet 11  . diphenoxylate-atropine (LOMOTIL) 2.5-0.025 MG tablet Take 1 tablet by mouth 4 (four) times daily as needed for diarrhea or loose stools. 90 tablet 0   No current facility-administered medications for this visit.    PHYSICAL EXAMINATION: ECOG PERFORMANCE STATUS: 1 - Symptomatic but completely ambulatory  Vitals:   11/29/20 0945  BP: 113/78  Pulse: 78  Resp: 18  Temp: 98.1 F (36.7 C)  SpO2: 100%   Filed Weights   11/29/20 0945  Weight: 173 lb (78.5 kg)    GENERAL:alert, no distress and comfortable SKIN: Noted cellulitis on the left abdominal wall EYES: normal, Conjunctiva are pink and non-injected, sclera clear OROPHARYNX:no exudate, no erythema and lips, buccal mucosa, and tongue normal  NECK: supple, thyroid normal size, non-tender, without nodularity LYMPH:  no palpable lymphadenopathy in the cervical, axillary or inguinal LUNGS: clear to auscultation and percussion with normal breathing effort HEART: regular rate & rhythm and no murmurs with stable bilateral lower extremity edema ABDOMEN:abdomen soft, non-tender and normal bowel sounds Musculoskeletal:no cyanosis of digits and no clubbing  NEURO: alert & oriented x 3 with fluent speech, no focal motor/sensory deficits  LABORATORY DATA:  I have reviewed the data as listed    Component Value Date/Time   NA 139 11/29/2020 0932   K 4.1 11/29/2020 0932   CL 102 11/29/2020 0932   CO2 28 11/29/2020 0932   GLUCOSE 89 11/29/2020 0932   BUN 12 11/29/2020 0932   CREATININE 0.95 11/29/2020 0932   CALCIUM 8.9 11/29/2020 0932   PROT 6.7 11/29/2020 0932   ALBUMIN 3.6 11/29/2020 0932   AST 16 11/29/2020 0932   ALT 9 11/29/2020 0932   ALKPHOS 88 11/29/2020 0932   BILITOT 0.5  11/29/2020 0932   GFRNONAA >60 11/29/2020 0932   GFRAA >60 04/06/2020 0827    No results found for: SPEP, UPEP  Lab Results  Component Value Date   WBC 4.2 11/29/2020   NEUTROABS 2.6 11/29/2020   HGB 14.0 11/29/2020   HCT 42.3 11/29/2020   MCV 92.0 11/29/2020   PLT 237 11/29/2020      Chemistry      Component Value Date/Time   NA 139 11/29/2020 0932   K 4.1 11/29/2020 0932   CL 102 11/29/2020 0932   CO2 28 11/29/2020 0932   BUN 12 11/29/2020 0932   CREATININE 0.95 11/29/2020 0932      Component Value Date/Time   CALCIUM 8.9 11/29/2020 0932   ALKPHOS 88 11/29/2020 0932   AST 16 11/29/2020 0932   ALT 9 11/29/2020 0932   BILITOT 0.5 11/29/2020 0932

## 2020-11-29 NOTE — Assessment & Plan Note (Signed)
She continues to have excessive fatigue despite normal blood work I recommend she contact her primary care doctor for further evaluation, for possible undiagnosed obstructive sleep apnea

## 2020-11-29 NOTE — Assessment & Plan Note (Signed)
She has no clinical signs or symptoms to suggest cancer recurrence except for slightly more leg swelling on the left Previously, we discussed the risk and benefits of surveillance imaging She was supposed to get her scan done last week but it was canceled due to lack of IV contrast, and when we try to reschedule it, the patient has made decision to cancel it altogether I told the patient there is no way for me to know if her leg swelling is due to fluid retention versus cancer recurrence She will think about it and will let me know if she is interested to reschedule his CT imaging Otherwise, I will see her back in 3 months

## 2020-11-29 NOTE — Assessment & Plan Note (Signed)
She has chronic lymphedema due to prior surgery and radiation Her leg is slightly more swollen She will continue to walk as frequent as possible and to use elastic compression hose We discussed the risk and benefits of imaging

## 2020-11-29 NOTE — Telephone Encounter (Signed)
Scheduled per los. Confirmed with patient. Declined printout  

## 2020-11-29 NOTE — Assessment & Plan Note (Signed)
She has multifactorial cause of diarrhea She will continue to use Lomotil as needed

## 2020-11-30 ENCOUNTER — Inpatient Hospital Stay: Payer: Medicare HMO | Admitting: Hematology and Oncology

## 2020-12-10 IMAGING — CT CT ABD-PELV W/ CM
2 of 5 series · 12 of 46 positions shown, 14 images · IV contrast (iopamidol)
Comparison: 06/23/2019

CLINICAL DATA: Right lower quadrant and back pain. Prior history of
uterine cancer.

EXAM:
CT ABDOMEN AND PELVIS WITH CONTRAST
TECHNIQUE: Multidetector CT imaging of the abdomen and pelvis was performed
using the standard protocol following bolus administration of
intravenous contrast.
CONTRAST:  100mL [id number] IOPAMIDOL (GQN7PD-[id number]) INJECTION 61%

[Series 2: abd pelvis 5.00 br40 s3 axial · axial · 0.58mm/px · z∈[+1077,+1432]mm · 9 of 89 slices shown, 11 images]
[im 9/89  soft-tissue]
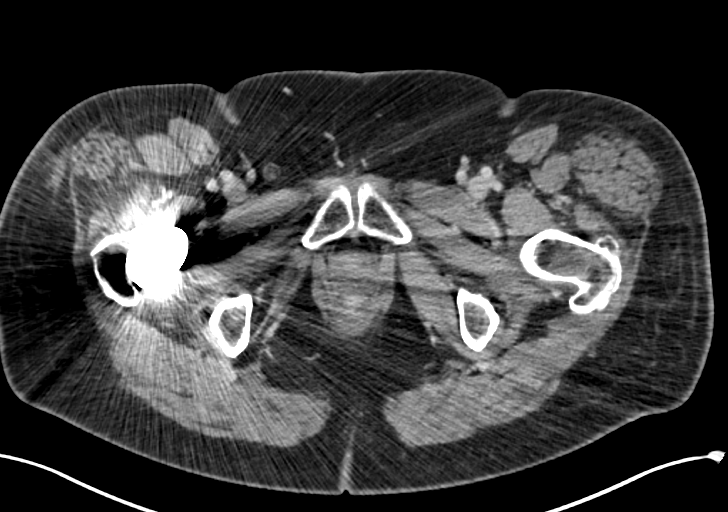
[im 9/89  bone]
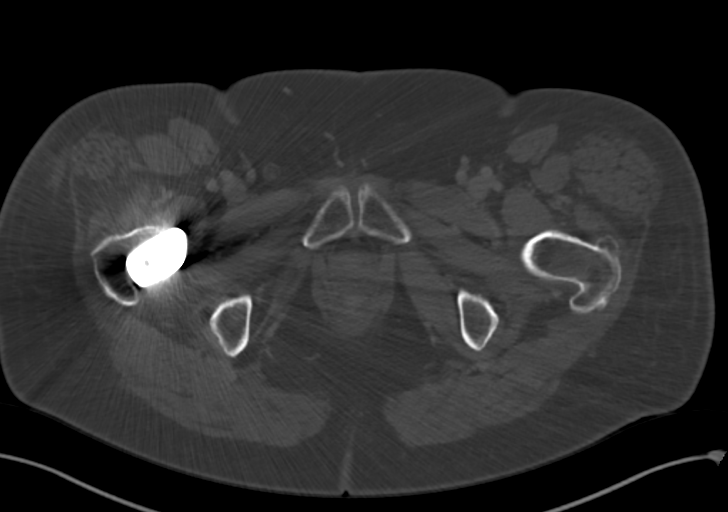
[im 18/89  soft-tissue]
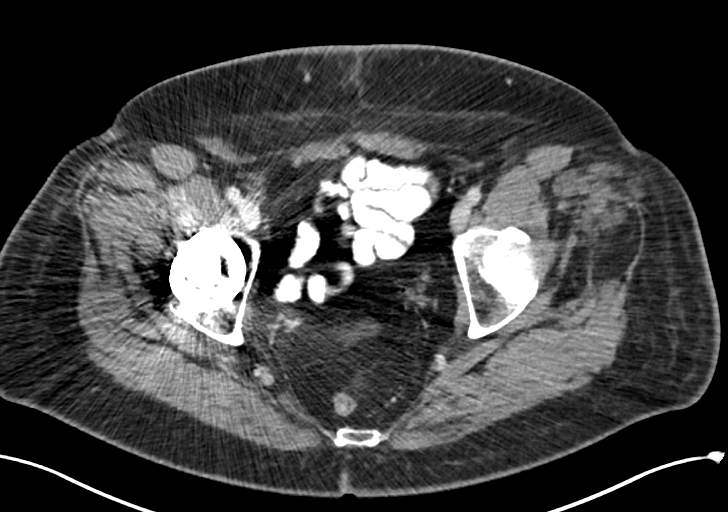
[im 27/89  soft-tissue]
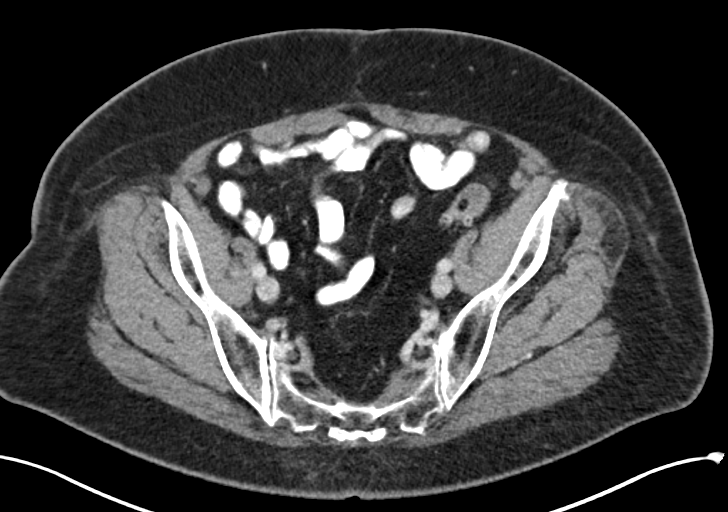
[im 36/89  soft-tissue]
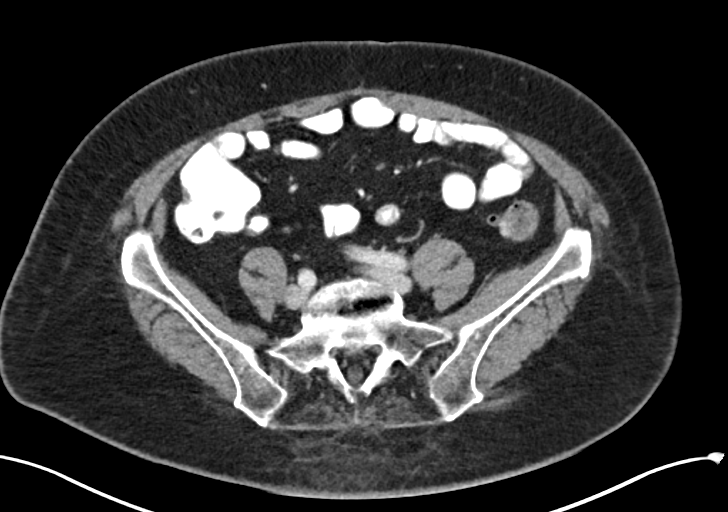
[im 45/89  soft-tissue]
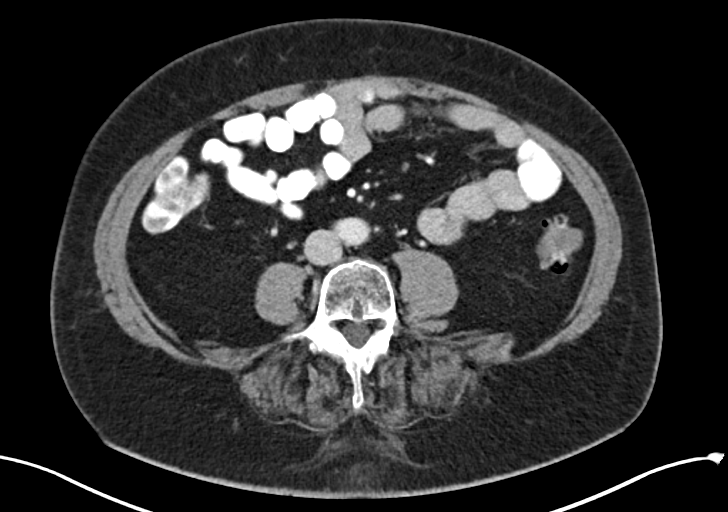
[im 53/89  soft-tissue]
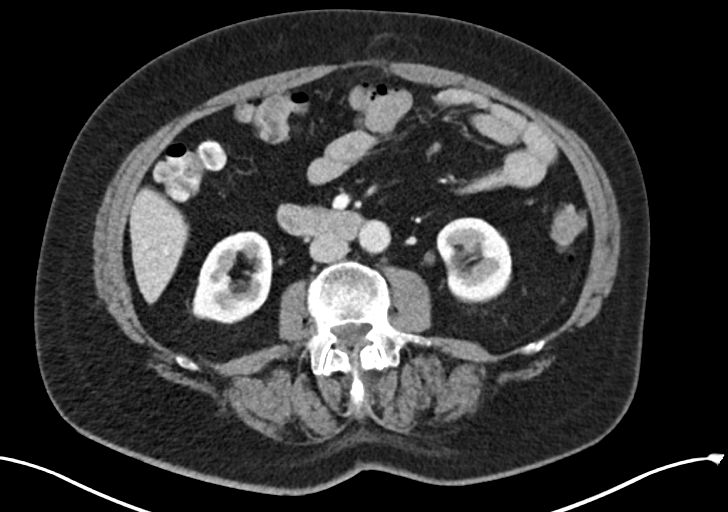
[im 62/89  soft-tissue]
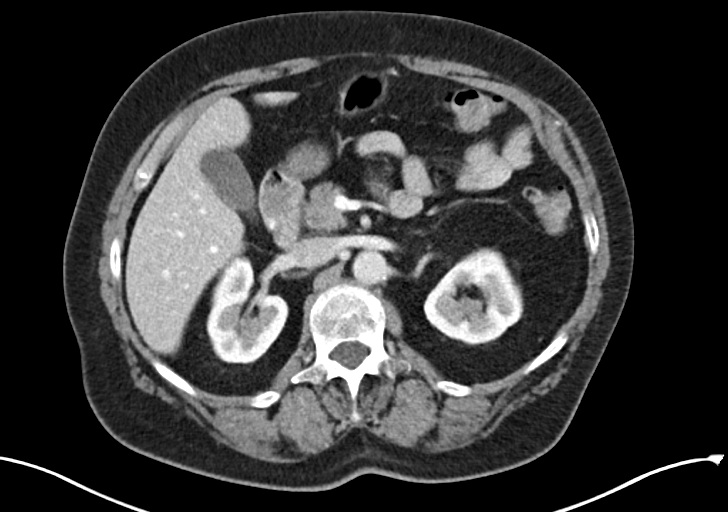
[im 71/89  soft-tissue]
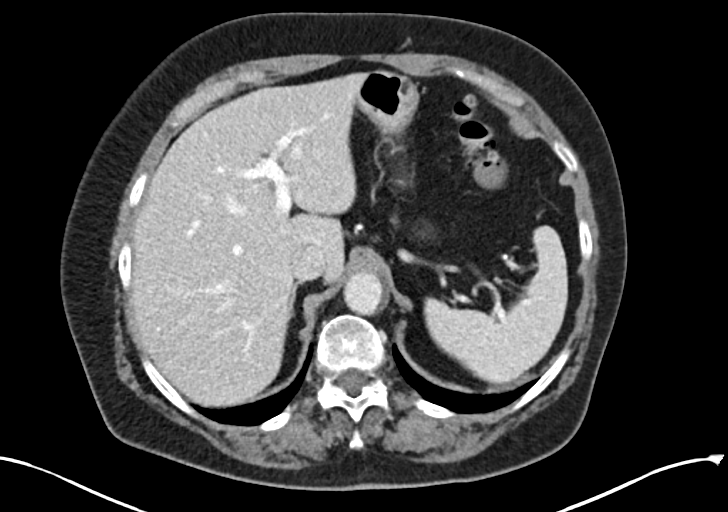
[im 80/89  soft-tissue]
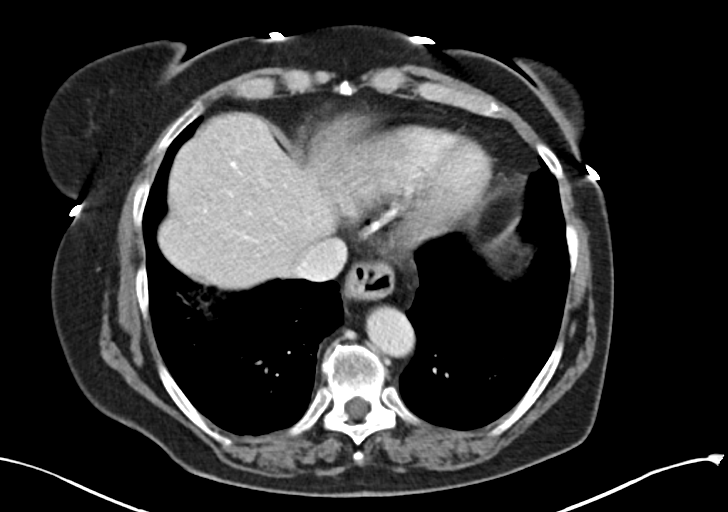
[im 80/89  bone]
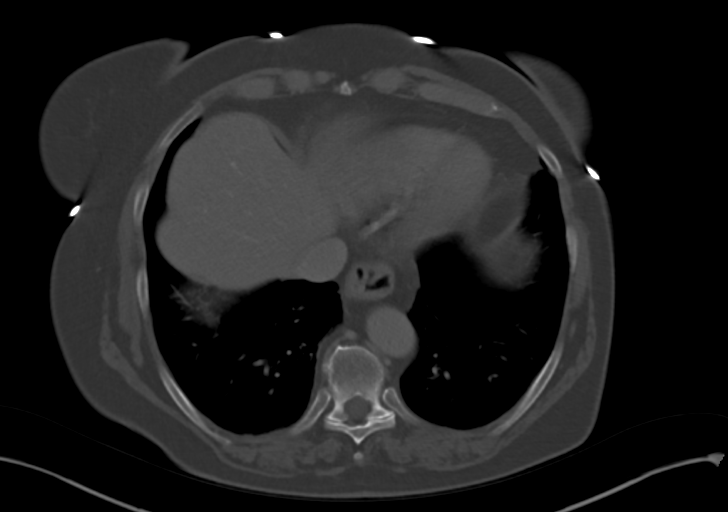

[Series 6: abd pelvis 2.00 br40 s3 cor · coronal · 0.81mm/px · 3 of 144 slices shown]
[im 48/144  soft-tissue]
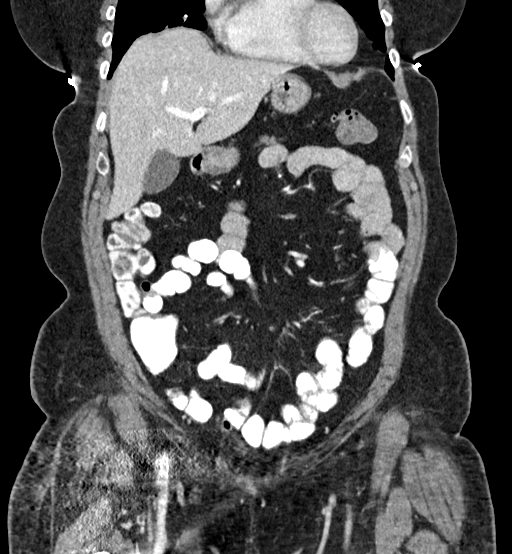
[im 64/144  soft-tissue]
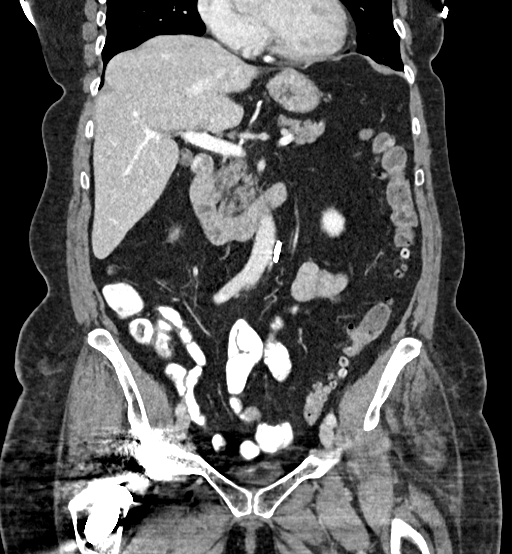
[im 80/144  soft-tissue]
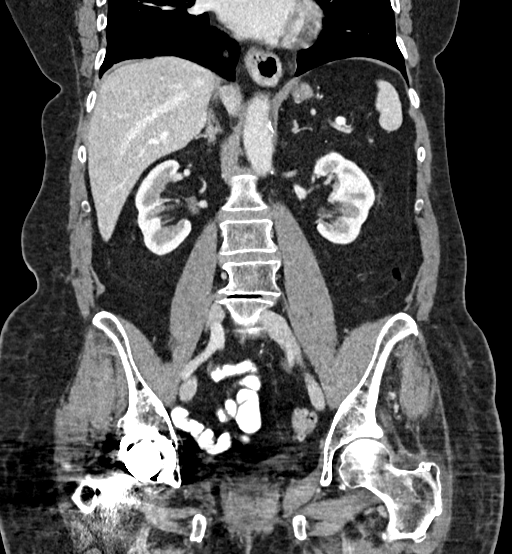

[12 of 46 positions shown; findings below may reference images not displayed]

FINDINGS: Lower chest: The lung bases are clear of an acute process. No
worrisome pulmonary lesions. The heart is normal in size. No
pericardial effusion. Small hiatal hernia.

Hepatobiliary: Stable scattered hepatic cysts. No worrisome hepatic
lesions. The gallbladder is normal. No intra or extrahepatic biliary
dilatation.

Pancreas: No mass, inflammation or ductal dilatation.

Spleen: Normal size. No focal lesions.

Adrenals/Urinary Tract: The adrenal glands and kidneys are
unremarkable and stable. No renal lesions or hydronephrosis. The
bladder is unremarkable.

Stomach/Bowel: The stomach, duodenum, small bowel and colon are
unremarkable. No acute inflammatory changes, mass lesions or
obstructive findings. The terminal ileum is normal. The appendix is
not identified. Fairly extensive colonic diverticulosis without
findings for acute diverticulitis.

Vascular/Lymphatic: Stable scattered aortic calcifications. No
aneurysm or dissection. The branch vessels are patent. The major
venous structures are patent.

No mesenteric or retroperitoneal mass or adenopathy.

The right iliac lymph node has resolved. No residual measurable
lymph node is identified.

The right inguinal node measures 9.5 mm and previously measured
mm.

Reproductive: Status post hysterectomy.

Other: No pelvic mass or adenopathy. No free pelvic fluid
collections. No inguinal mass or adenopathy. Small anterior
abdominal wall hernia containing fat. No omental or peritoneal
surface disease identified.

Musculoskeletal: No significant bony findings.
IMPRESSION: 1. No acute abdominal/pelvic findings, mass lesions or adenopathy.
2. Interval resolution of right iliac lymph node. No residual
measurable lymph node is identified. Decrease in size of the right
inguinal node.
3. Stable scattered hepatic cysts.

Aortic Atherosclerosis ([id number]).

## 2020-12-21 DIAGNOSIS — E039 Hypothyroidism, unspecified: Secondary | ICD-10-CM | POA: Diagnosis not present

## 2020-12-21 DIAGNOSIS — Z1389 Encounter for screening for other disorder: Secondary | ICD-10-CM | POA: Diagnosis not present

## 2020-12-21 DIAGNOSIS — Z0000 Encounter for general adult medical examination without abnormal findings: Secondary | ICD-10-CM | POA: Diagnosis not present

## 2020-12-21 DIAGNOSIS — R82998 Other abnormal findings in urine: Secondary | ICD-10-CM | POA: Diagnosis not present

## 2020-12-21 DIAGNOSIS — C774 Secondary and unspecified malignant neoplasm of inguinal and lower limb lymph nodes: Secondary | ICD-10-CM | POA: Diagnosis not present

## 2020-12-21 DIAGNOSIS — E559 Vitamin D deficiency, unspecified: Secondary | ICD-10-CM | POA: Diagnosis not present

## 2020-12-21 DIAGNOSIS — R599 Enlarged lymph nodes, unspecified: Secondary | ICD-10-CM | POA: Diagnosis not present

## 2020-12-21 DIAGNOSIS — Z1331 Encounter for screening for depression: Secondary | ICD-10-CM | POA: Diagnosis not present

## 2020-12-21 DIAGNOSIS — I6521 Occlusion and stenosis of right carotid artery: Secondary | ICD-10-CM | POA: Diagnosis not present

## 2020-12-21 DIAGNOSIS — I10 Essential (primary) hypertension: Secondary | ICD-10-CM | POA: Diagnosis not present

## 2020-12-21 DIAGNOSIS — Z8542 Personal history of malignant neoplasm of other parts of uterus: Secondary | ICD-10-CM | POA: Diagnosis not present

## 2020-12-21 DIAGNOSIS — I890 Lymphedema, not elsewhere classified: Secondary | ICD-10-CM | POA: Diagnosis not present

## 2020-12-21 DIAGNOSIS — G620 Drug-induced polyneuropathy: Secondary | ICD-10-CM | POA: Diagnosis not present

## 2020-12-21 DIAGNOSIS — I77810 Thoracic aortic ectasia: Secondary | ICD-10-CM | POA: Diagnosis not present

## 2020-12-21 DIAGNOSIS — E785 Hyperlipidemia, unspecified: Secondary | ICD-10-CM | POA: Diagnosis not present

## 2020-12-22 ENCOUNTER — Encounter (HOSPITAL_COMMUNITY): Payer: Medicare HMO

## 2020-12-22 ENCOUNTER — Other Ambulatory Visit (HOSPITAL_COMMUNITY): Payer: Self-pay | Admitting: Internal Medicine

## 2020-12-22 DIAGNOSIS — I890 Lymphedema, not elsewhere classified: Secondary | ICD-10-CM

## 2020-12-23 ENCOUNTER — Ambulatory Visit (HOSPITAL_COMMUNITY)
Admission: RE | Admit: 2020-12-23 | Discharge: 2020-12-23 | Disposition: A | Discharge status: Home / Self Care | Payer: Medicare HMO | Source: Ambulatory Visit | Attending: Internal Medicine | Admitting: Internal Medicine

## 2020-12-23 ENCOUNTER — Other Ambulatory Visit: Payer: Self-pay

## 2020-12-23 DIAGNOSIS — I890 Lymphedema, not elsewhere classified: Secondary | ICD-10-CM | POA: Diagnosis not present

## 2021-01-13 ENCOUNTER — Other Ambulatory Visit: Payer: Self-pay | Admitting: Hematology and Oncology

## 2021-01-14 ENCOUNTER — Encounter: Payer: Self-pay | Admitting: Hematology and Oncology

## 2021-01-18 DIAGNOSIS — I10 Essential (primary) hypertension: Secondary | ICD-10-CM | POA: Diagnosis not present

## 2021-01-18 DIAGNOSIS — I890 Lymphedema, not elsewhere classified: Secondary | ICD-10-CM | POA: Diagnosis not present

## 2021-01-18 DIAGNOSIS — E559 Vitamin D deficiency, unspecified: Secondary | ICD-10-CM | POA: Diagnosis not present

## 2021-02-10 ENCOUNTER — Telehealth: Payer: Self-pay | Admitting: Hematology and Oncology

## 2021-02-10 NOTE — Telephone Encounter (Signed)
R/s from 8/16. Called and spoke with pt confirmed 8/18 appt

## 2021-02-22 DIAGNOSIS — L03116 Cellulitis of left lower limb: Secondary | ICD-10-CM | POA: Diagnosis not present

## 2021-02-22 DIAGNOSIS — I890 Lymphedema, not elsewhere classified: Secondary | ICD-10-CM | POA: Diagnosis not present

## 2021-02-25 ENCOUNTER — Other Ambulatory Visit (HOSPITAL_COMMUNITY): Payer: Self-pay | Admitting: Adult Health

## 2021-02-25 ENCOUNTER — Ambulatory Visit (HOSPITAL_COMMUNITY)
Admission: RE | Admit: 2021-02-25 | Discharge: 2021-02-25 | Disposition: A | Discharge status: Home / Self Care | Payer: Medicare HMO | Source: Ambulatory Visit | Attending: Internal Medicine | Admitting: Internal Medicine

## 2021-02-25 ENCOUNTER — Other Ambulatory Visit: Payer: Self-pay

## 2021-02-25 DIAGNOSIS — L03116 Cellulitis of left lower limb: Secondary | ICD-10-CM | POA: Diagnosis not present

## 2021-02-25 DIAGNOSIS — R609 Edema, unspecified: Secondary | ICD-10-CM | POA: Diagnosis not present

## 2021-02-25 DIAGNOSIS — I890 Lymphedema, not elsewhere classified: Secondary | ICD-10-CM | POA: Diagnosis not present

## 2021-02-25 DIAGNOSIS — M79652 Pain in left thigh: Secondary | ICD-10-CM | POA: Diagnosis not present

## 2021-02-25 DIAGNOSIS — R590 Localized enlarged lymph nodes: Secondary | ICD-10-CM | POA: Diagnosis not present

## 2021-02-25 DIAGNOSIS — M7989 Other specified soft tissue disorders: Secondary | ICD-10-CM | POA: Diagnosis not present

## 2021-03-01 ENCOUNTER — Other Ambulatory Visit: Payer: Medicare HMO

## 2021-03-01 ENCOUNTER — Ambulatory Visit: Payer: Medicare HMO | Admitting: Hematology and Oncology

## 2021-03-01 ENCOUNTER — Telehealth: Payer: Self-pay

## 2021-03-01 NOTE — Telephone Encounter (Signed)
Called Mrs. Groner back and she wanted to clarify her appt time.  I informed her she has lab at 1100 and sees Dr. Alvy Bimler at 1140.  She did want Dr. Alvy Bimler to know that her lymphedema in her left leg is very bad and her PCP has been following it, but the PCP wanted Dr. Alvy Bimler to follow up with her as well.  She has been taking sulfamethoxazole for the infection and tylenol for pain.  She stated that it is worse in the evening.  She was wondering if she needed extra labs due to the infection. Gardiner Rhyme, RN

## 2021-03-03 ENCOUNTER — Inpatient Hospital Stay: Payer: Medicare HMO | Attending: Hematology and Oncology

## 2021-03-03 ENCOUNTER — Other Ambulatory Visit: Payer: Self-pay

## 2021-03-03 ENCOUNTER — Inpatient Hospital Stay: Payer: Medicare HMO | Admitting: Hematology and Oncology

## 2021-03-03 ENCOUNTER — Encounter: Payer: Self-pay | Admitting: Hematology and Oncology

## 2021-03-03 VITALS — BP 134/83 | HR 70 | Temp 97.9°F | Resp 18 | Ht 62.0 in | Wt 171.0 lb

## 2021-03-03 DIAGNOSIS — Z79899 Other long term (current) drug therapy: Secondary | ICD-10-CM | POA: Diagnosis not present

## 2021-03-03 DIAGNOSIS — I890 Lymphedema, not elsewhere classified: Secondary | ICD-10-CM | POA: Diagnosis not present

## 2021-03-03 DIAGNOSIS — C774 Secondary and unspecified malignant neoplasm of inguinal and lower limb lymph nodes: Secondary | ICD-10-CM | POA: Diagnosis not present

## 2021-03-03 DIAGNOSIS — C541 Malignant neoplasm of endometrium: Secondary | ICD-10-CM | POA: Diagnosis not present

## 2021-03-03 DIAGNOSIS — G893 Neoplasm related pain (acute) (chronic): Secondary | ICD-10-CM | POA: Diagnosis not present

## 2021-03-03 DIAGNOSIS — C55 Malignant neoplasm of uterus, part unspecified: Secondary | ICD-10-CM

## 2021-03-03 LAB — CBC WITH DIFFERENTIAL/PLATELET
Abs Immature Granulocytes: 0.02 10*3/uL (ref 0.00–0.07)
Basophils Absolute: 0 10*3/uL (ref 0.0–0.1)
Basophils Relative: 1 %
Eosinophils Absolute: 0.1 10*3/uL (ref 0.0–0.5)
Eosinophils Relative: 2 %
HCT: 38.4 % (ref 36.0–46.0)
Hemoglobin: 12.6 g/dL (ref 12.0–15.0)
Immature Granulocytes: 0 %
Lymphocytes Relative: 18 %
Lymphs Abs: 1 10*3/uL (ref 0.7–4.0)
MCH: 30.6 pg (ref 26.0–34.0)
MCHC: 32.8 g/dL (ref 30.0–36.0)
MCV: 93.2 fL (ref 80.0–100.0)
Monocytes Absolute: 0.5 10*3/uL (ref 0.1–1.0)
Monocytes Relative: 10 %
Neutro Abs: 3.9 10*3/uL (ref 1.7–7.7)
Neutrophils Relative %: 69 %
Platelets: 343 10*3/uL (ref 150–400)
RBC: 4.12 MIL/uL (ref 3.87–5.11)
RDW: 13.5 % (ref 11.5–15.5)
WBC: 5.6 10*3/uL (ref 4.0–10.5)
nRBC: 0 % (ref 0.0–0.2)

## 2021-03-03 LAB — COMPREHENSIVE METABOLIC PANEL
ALT: 10 U/L (ref 0–44)
AST: 11 U/L — ABNORMAL LOW (ref 15–41)
Albumin: 3 g/dL — ABNORMAL LOW (ref 3.5–5.0)
Alkaline Phosphatase: 110 U/L (ref 38–126)
Anion gap: 8 (ref 5–15)
BUN: 14 mg/dL (ref 8–23)
CO2: 24 mmol/L (ref 22–32)
Calcium: 9.2 mg/dL (ref 8.9–10.3)
Chloride: 106 mmol/L (ref 98–111)
Creatinine, Ser: 0.92 mg/dL (ref 0.44–1.00)
GFR, Estimated: >60 mL/min (ref >60)
Glucose, Bld: 96 mg/dL (ref 70–99)
Potassium: 4.3 mmol/L (ref 3.5–5.1)
Sodium: 138 mmol/L (ref 135–145)
Total Bilirubin: 0.2 mg/dL — ABNORMAL LOW (ref 0.3–1.2)
Total Protein: 6.6 g/dL (ref 6.5–8.1)

## 2021-03-03 NOTE — Assessment & Plan Note (Signed)
She has significant left lower extremity edema and left groin pain, worrisome for cancer recurrence Her abdominal examination is difficult due to central obesity  Her left lower extremity, however, is profoundly swollen compared to previous exam She was rule out DVT recently I think it is very likely she has recurrent uterine cancer with intra-abdominal disease causing venous obstruction as a cause of her symptoms I will order CT imaging of the abdomen and pelvis with contrast to be done as soon as possible and see her back to review test results and to determine the next step

## 2021-03-03 NOTE — Assessment & Plan Note (Signed)
She is known to have chronic lymphedema secondary to her disease According to the patient, she underwent venous ultrasound Doppler that ruled out DVT She will continue leg elevation as tolerated

## 2021-03-03 NOTE — Assessment & Plan Note (Signed)
She have severe left groin pain and lower extremity pain secondary to likely cancer recurrence I offer her prescription narcotics but the patient would like to hold off She will continue acetaminophen and leg elevation

## 2021-03-03 NOTE — Progress Notes (Signed)
[place] OFFICE PROGRESS NOTE  Patient Care Team: Shon Baton, MD as PCP - General (Internal Medicine) Awanda Mink Craige Cotta, RN as Oncology Nurse Navigator (Oncology)  ASSESSMENT & PLAN:  Endometrioid adenocarcinoma of uterus [facility]) She has significant left lower extremity edema and left groin pain, worrisome for cancer recurrence Her abdominal examination is difficult due to central obesity  Her left lower extremity, however, is profoundly swollen compared to previous exam She was rule out DVT recently I think it is very likely she has recurrent uterine cancer with intra-abdominal disease causing venous obstruction as a cause of her symptoms I will order CT imaging of the abdomen and pelvis with contrast to be done as soon as possible and see her back to review test results and to determine the next step  Cancer associated pain She have severe left groin pain and lower extremity pain secondary to likely cancer recurrence I offer her prescription narcotics but the patient would like to hold off She will continue acetaminophen and leg elevation  Lymphedema She is known to have chronic lymphedema secondary to her disease According to the patient, she underwent venous ultrasound Doppler that ruled out DVT She will continue leg elevation as tolerated  Orders Placed This Encounter  Procedures   CT ABDOMEN PELVIS W CONTRAST    Standing Status:   Future    Standing Expiration Date:   03/03/2022    Order Specific Question:   If indicated for the ordered procedure, I authorize the administration of contrast media per Radiology protocol    Answer:   Yes    Order Specific Question:   Preferred imaging location?    Answer:   [facility]    Order Specific Question:   Radiology Contrast Protocol - do NOT remove file path    Answer:   [email or web address]    All questions were answered. The patient knows to call the clinic with any  problems, questions or concerns. The total time spent in the appointment was 30 minutes encounter with patients including review of chart and various tests results, discussions about plan of care and coordination of care plan   Dana Lark, MD 03/03/2021 11:33 AM  INTERVAL HISTORY: Please see below for problem oriented charting. She is seen urgently for further follow-up She complain of severe left groin pain and lower extremity pain and swelling for the past few months It is getting worse She had recent evaluation and was placed on antibiotics for skin cellulitis Venous Doppler ultrasound was negative for DVT She could hardly walk with the leg edema She have some pressure sensation on her bladder with some mild difficulty with urination but denies changes in her bowel habits She has chronic diarrhea, stable  SUMMARY OF ONCOLOGIC HISTORY: Oncology History Overview Note  ER 90%, PR 95% PD-L1 score 1% MSI Stable   Endometrioid adenocarcinoma of uterus ([place])  01/05/2014 Initial Diagnosis   The patient has a history of a 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  The patient's initial surgery was with Dr. Marti Sleigh in [place] on January 05, 2014.  It included an exploratory laparotomy with TAH, BSO, appendectomy, pelvic and para-aortic lymphadenectomy and wedge resection of a concerning appearing liver lesion.  Preoperatively she presented with a 26 cm right adnexal mass and an elevated Ca1 25 at 404 units/mL.  At the completion of surgery there was no gross residual disease.  Final pathology showed a primary grade 3  endometrial adenocarcinoma (endometrioid) involving both ovaries.  Lymph nodes and other biopsies were negative.  She had uncomplicated postoperative course.  She was treated with 6 cycles of carboplatin paclitaxel chemotherapy.  She was recommended adjuvant radiation therapy however declined this after counseling.   Following her initial therapy she developed severe left lower extremity lymphedema.   Follow-up CT scan in March 2016 showed no evidence of disease.  She was followed every 3 months for a total of 5 years with no recurrence appreciated during that time   03/28/2019 Imaging   Ct abdomen and pelvis 1. Enlarged right inguinal lymph node or soft tissue mass, measuring 3.8 x 3.5 cm (series 2, image 69). Additional enlarged right iliac lymph node measuring 1.9 x 1.1 cm (series 2, image 68). Findings are concerning for malignancy.   2. Status post hysterectomy per stated history of endometrial malignancy.   3. No other findings in the abdomen or pelvis concerning for malignancy or metastatic disease.   4. There are multiple subcentimeter low-attenuation lesions of the liver, the largest in the left lobe the measuring 7 mm, too small to characterize (series 2, image 24). Attention on follow-up although these are most likely incidental benign small cysts and/or hemangiomata.   5.  Aortic atherosclerosis.   04/10/2019 Pathology Results   A. LYMPH NODE, RIGHT INGUINAL, NEEDLE CORE BIOPSY:  - Metastatic adenocarcinoma.  - See comment.   COMMENT:   The morphology is most consistent with metastatic endometrioid adenocarcinoma.  Immunohistochemistry can be performed if clinically indicated.  There is sufficient tissue for additional testing   04/10/2019 Procedure   Technically successful ultrasound guided biopsy of dominant right inguinal lymph node   04/22/2019 Cancer Staging   Staging form: Corpus Uteri - Carcinoma and Carcinosarcoma, AJCC 8th Edition - Pathologic stage from 04/22/2019: FIGO Stage IVB (rpT3a, pN0, pM1) - Signed by Dana Lark, MD on 04/22/2019   04/22/2019 PET scan   1. Two hypermetabolic RIGHT iliac lymph nodes and larger metastatic RIGHT inguinal lymph node. 2. No evidence of metastatic disease outside the pelvis. 3. No clear evidence of activity at the vaginal cuff.    05/15/2019 Procedure   Placement of a subcutaneous port device. Catheter tip in the lower SVC   05/16/2019 - 06/12/2019 Chemotherapy   The patient had cisplatin for chemotherapy treatment.      Genetic Testing   Patient has genetic testing done for MSI. Results revealed patient has the following: MSI Stable   06/23/2019 Imaging   1. Interval decreased size of right inguinal and external iliac lymph nodes. 2. No evidence of progressive metastatic disease. 3. Stable small low-density hepatic lesions, likely cysts. 4. Aortic Atherosclerosis (ICD10-I70.0).     07/14/2019 - 04/06/2020 Chemotherapy   The patient had everolimus and letrozole for chemotherapy treatment.  Treatment was discontinued due to significant side effects    10/20/2019 Imaging   1. No acute abdominal/pelvic findings, mass lesions or adenopathy. 2. Interval resolution of right iliac lymph node. No residual measurable lymph node is identified. Decrease in size of the right inguinal node. 3. Stable scattered hepatic cysts.   Aortic Atherosclerosis (ICD10-I70.0).   03/08/2020 Imaging   No evidence of recurrent or metastatic carcinoma within the abdomen or pelvis.   Colonic diverticulosis. No radiographic evidence of diverticulitis.   Stable small epigastric ventral hernia containing only fat.     05/31/2020 Imaging   Status post hysterectomy and suspected bilateral salpingo-oophorectomy.   No evidence of recurrent or metastatic disease.   06/22/2020  Procedure   Successful right IJ vein Port-A-Cath explant.       REVIEW OF SYSTEMS:   Constitutional: Denies fevers, chills or abnormal weight loss Eyes: Denies blurriness of vision Ears, nose, mouth, throat, and face: Denies mucositis or sore throat Respiratory: Denies cough, dyspnea or wheezes Lymphatics: Denies new lymphadenopathy or easy bruising Neurological:Denies numbness, tingling or new weaknesses Behavioral/Psych: Mood is stable, no new changes   All other systems were reviewed with the patient and are negative.  I have reviewed the past medical history, past surgical history, social history and family history with the patient and they are unchanged from previous note.  ALLERGIES:  is allergic to oxycodone, garlic, other, and penicillins.  MEDICATIONS:  Current Outpatient Medications  Medication Sig Dispense Refill   amLODipine (NORVASC) 5 MG tablet TAKE ONE TABLET BY MOUTH DAILY 90 tablet 11   diphenoxylate-atropine (LOMOTIL) 2.5-0.025 MG tablet Take 1 tablet by mouth 4 (four) times daily as needed for diarrhea or loose stools. 90 tablet 0   doxycycline (VIBRA-TABS) 100 MG tablet Take 1 tablet (100 mg total) by mouth 2 (two) times daily. 20 tablet 0   No current facility-administered medications for this visit.    PHYSICAL EXAMINATION: ECOG PERFORMANCE STATUS: 2 - Symptomatic, <50% confined to bed  Vitals:   03/03/21 1117  BP: 134/83  Pulse: 70  Resp: 18  Temp: 97.9 F (36.6 C)  SpO2: 100%   Filed Weights   03/03/21 1117  Weight: 171 lb (77.6 kg)    GENERAL:alert, no distress and comfortable SKIN: skin of the left lower extremity is mildly erythematous but does not resemble cellulitis EYES: normal, Conjunctiva are pink and non-injected, sclera clear OROPHARYNX:no exudate, no erythema and lips, buccal mucosa, and tongue normal  NECK: supple, thyroid normal size, non-tender, without nodularity LYMPH:  no palpable lymphadenopathy in the cervical, axillary or inguinal LUNGS: clear to auscultation and percussion with normal breathing effort HEART: regular rate & rhythm and no murmurs with profound left lower extremity edema ABDOMEN:abdomen soft, limited exam central obesity Musculoskeletal:no cyanosis of digits and no clubbing  NEURO: alert & oriented x 3 with fluent speech, no focal motor/sensory deficits  LABORATORY DATA:  I have reviewed the data as listed    Component Value Date/Time   NA 139 11/29/2020 0932    K 4.1 11/29/2020 0932   CL 102 11/29/2020 0932   CO2 28 11/29/2020 0932   GLUCOSE 89 11/29/2020 0932   BUN 12 11/29/2020 0932   CREATININE 0.95 11/29/2020 0932   CALCIUM 8.9 11/29/2020 0932   PROT 6.7 11/29/2020 0932   ALBUMIN 3.6 11/29/2020 0932   AST 16 11/29/2020 0932   ALT 9 11/29/2020 0932   ALKPHOS 88 11/29/2020 0932   BILITOT 0.5 11/29/2020 0932   GFRNONAA >60 11/29/2020 0932   GFRAA >60 04/06/2020 0827    No results found for: SPEP, UPEP  Lab Results  Component Value Date   WBC 5.6 03/03/2021   NEUTROABS 3.9 03/03/2021   HGB 12.6 03/03/2021   HCT 38.4 03/03/2021   MCV 93.2 03/03/2021   PLT 343 03/03/2021      Chemistry      Component Value Date/Time   NA 139 11/29/2020 0932   K 4.1 11/29/2020 0932   CL 102 11/29/2020 0932   CO2 28 11/29/2020 0932   BUN 12 11/29/2020 0932   CREATININE 0.95 11/29/2020 0932      Component Value Date/Time   CALCIUM 8.9 11/29/2020 0932   ALKPHOS 88 11/29/2020  0932   AST 16 11/29/2020 0932   ALT 9 11/29/2020 0932   BILITOT 0.5 11/29/2020 0932       RADIOGRAPHIC STUDIES: I have personally reviewed the radiological images as listed and agreed with the findings in the report. VAS [place] LOWER EXTREMITY VENOUS (DVT)  Result Date: 02/26/2021  Lower Venous DVT Study Patient Name:  Dana [name]  Date of Exam:   02/25/2021 Medical Rec #: [id number]       Accession #:    [id number] Date of Birth: 1951/03/08       Patient Gender: F Patient Age:   70 years Exam Location:  [facility] Procedure:      VAS [place] LOWER EXTREMITY VENOUS (DVT) Referring Phys: Colletta [place] EDWARDS --------------------------------------------------------------------------------  Indications: Swelling, and Pain.  Limitations: Extreme edema, patient guarding. Performing Technologist: Ralene Cork RVT  Examination Guidelines: A complete evaluation includes B-mode imaging, spectral Doppler, color Doppler, and power Doppler as needed of all accessible  portions of each vessel. Bilateral testing is considered an integral part of a complete examination. Limited examinations for reoccurring indications may be performed as noted. The reflux portion of the exam is performed with the patient in reverse Trendelenburg.  +---------+---------------+---------+-----------+----------+--------------+ LEFT     CompressibilityPhasicitySpontaneityPropertiesThrombus Aging +---------+---------------+---------+-----------+----------+--------------+ CFV      Partial        Yes      Yes                                 +---------+---------------+---------+-----------+----------+--------------+ SFJ      Partial        Yes      Yes                                 +---------+---------------+---------+-----------+----------+--------------+ FV Prox  Full           Yes      Yes                                 +---------+---------------+---------+-----------+----------+--------------+ FV Mid   Full           Yes      Yes                                 +---------+---------------+---------+-----------+----------+--------------+ FV DistalFull           Yes      Yes                                 +---------+---------------+---------+-----------+----------+--------------+ POP      Full           Yes      Yes                                 +---------+---------------+---------+-----------+----------+--------------+ PTV      Full           Yes      Yes                                 +---------+---------------+---------+-----------+----------+--------------+  GSV      Full           Yes      Yes                                 +---------+---------------+---------+-----------+----------+--------------+ Peroneal vein not adequately visualized. Unable to fully compress the CFV and SFJ, possibly due to patient guarding but cannot rule out chronic thrombus.  Findings reported to [place] at 1:45 pm.  Summary: LEFT: - No acute DVT or  superficial vein thrombosis seen. Cannot rule out chronic thrombus in the CFV and SFJ, however, incomplete compression of these veins may be due to patient guarding. Patent external iliac vein.  *See table(s) above for measurements and observations. Electronically signed by Harold Barban MD on 02/26/2021 at 1:32:18 PM.    Final

## 2021-03-10 ENCOUNTER — Encounter (HOSPITAL_BASED_OUTPATIENT_CLINIC_OR_DEPARTMENT_OTHER): Payer: Self-pay

## 2021-03-10 ENCOUNTER — Other Ambulatory Visit: Payer: Self-pay

## 2021-03-10 ENCOUNTER — Ambulatory Visit (HOSPITAL_BASED_OUTPATIENT_CLINIC_OR_DEPARTMENT_OTHER)
Admission: RE | Admit: 2021-03-10 | Discharge: 2021-03-10 | Disposition: A | Discharge status: Home / Self Care | Payer: Medicare HMO | Source: Ambulatory Visit | Attending: Hematology and Oncology | Admitting: Hematology and Oncology

## 2021-03-10 DIAGNOSIS — C541 Malignant neoplasm of endometrium: Secondary | ICD-10-CM | POA: Diagnosis not present

## 2021-03-10 DIAGNOSIS — C55 Malignant neoplasm of uterus, part unspecified: Secondary | ICD-10-CM | POA: Insufficient documentation

## 2021-03-10 DIAGNOSIS — K436 Other and unspecified ventral hernia with obstruction, without gangrene: Secondary | ICD-10-CM | POA: Diagnosis not present

## 2021-03-10 MED ORDER — IOHEXOL 350 MG/ML SOLN
60.0000 mL | Freq: Once | INTRAVENOUS | Status: AC | PRN
  Administered 2021-03-10: 60 mL via INTRAVENOUS

## 2021-03-11 ENCOUNTER — Inpatient Hospital Stay: Payer: Medicare HMO | Admitting: Hematology and Oncology

## 2021-03-11 ENCOUNTER — Encounter: Payer: Self-pay | Admitting: Hematology and Oncology

## 2021-03-11 ENCOUNTER — Telehealth: Payer: Self-pay | Admitting: Oncology

## 2021-03-11 DIAGNOSIS — C55 Malignant neoplasm of uterus, part unspecified: Secondary | ICD-10-CM

## 2021-03-11 DIAGNOSIS — G893 Neoplasm related pain (acute) (chronic): Secondary | ICD-10-CM | POA: Diagnosis not present

## 2021-03-11 DIAGNOSIS — C774 Secondary and unspecified malignant neoplasm of inguinal and lower limb lymph nodes: Secondary | ICD-10-CM | POA: Diagnosis not present

## 2021-03-11 DIAGNOSIS — C541 Malignant neoplasm of endometrium: Secondary | ICD-10-CM | POA: Diagnosis not present

## 2021-03-11 DIAGNOSIS — Z79899 Other long term (current) drug therapy: Secondary | ICD-10-CM | POA: Diagnosis not present

## 2021-03-11 DIAGNOSIS — R197 Diarrhea, unspecified: Secondary | ICD-10-CM

## 2021-03-11 DIAGNOSIS — I890 Lymphedema, not elsewhere classified: Secondary | ICD-10-CM | POA: Diagnosis not present

## 2021-03-11 MED ORDER — DIPHENOXYLATE-ATROPINE 2.5-0.025 MG PO TABS: 1.0000 | ORAL_TABLET | Freq: Four times a day (QID) | ORAL | 0 refills | Status: DC | PRN

## 2021-03-11 NOTE — Assessment & Plan Note (Signed)
She felt that her pain is slightly better controlled with only acetaminophen

## 2021-03-11 NOTE — Assessment & Plan Note (Addendum)
Unfortunately, she did have confirmed disease relapse on imaging study She has bilateral inguinal lymphadenopathy indicative of disease relapse Her symptoms are slightly improved since last time I saw her  I shared with her the current guidelines related to treatment for relapsed disease I discussed the risk, benefits, side effects of carboplatin with paclitaxel's versus single agent carboplatin versus combination of pembrolizumab with Lenvima or antiestrogen therapy At the end of the day, she is undecided She will call me next week with her final decision

## 2021-03-11 NOTE — Progress Notes (Signed)
[name] OFFICE PROGRESS NOTE  Patient Care Team: Shon Baton, MD as PCP - General (Internal Medicine) Awanda Mink Craige Cotta, RN as Oncology Nurse Navigator (Oncology)  ASSESSMENT & PLAN:  Endometrioid adenocarcinoma of uterus [facility]) Unfortunately, she did have confirmed disease relapse on imaging study She has bilateral inguinal lymphadenopathy indicative of disease relapse Her symptoms are slightly improved since last time I saw her  I shared with her the current guidelines related to treatment for relapsed disease I discussed the risk, benefits, side effects of carboplatin with paclitaxel's versus single agent carboplatin versus combination of pembrolizumab with Lenvima or antiestrogen therapy At the end of the day, she is undecided She will call me next week with her final decision    Cancer associated pain She felt that her pain is slightly better controlled with only acetaminophen   Lymphedema Her worsening lymphedema is due to disease relapse She felt better since last time I saw her  Diarrhea This is chronic in nature I refilled her prescription Lomotil  No orders of the defined types were placed in this encounter.   All questions were answered. The patient knows to call the clinic with any problems, questions or concerns. The total time spent in the appointment was 30 minutes encounter with patients including review of chart and various tests results, discussions about plan of care and coordination of care plan   [name], MD 03/11/2021 10:30 AM  INTERVAL HISTORY: Please see below for problem oriented charting. 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  The patient's initial surgery was with Dr. Marti Sleigh in [place] on January 05, 2014.  It included an exploratory laparotomy with TAH, BSO, appendectomy, pelvic and para-aortic lymphadenectomy and wedge resection of a concerning appearing liver lesion.  Preoperatively she presented with a 26 cm right adnexal mass and an elevated Ca1 25 at 404 units/mL.  At the completion of surgery there was no gross residual disease.  Final pathology showed a primary grade 3 endometrial adenocarcinoma (endometrioid) involving both ovaries.  Lymph nodes and other biopsies were negative.  She had uncomplicated postoperative course.  She was treated with 6 cycles of carboplatin paclitaxel chemotherapy.  She was recommended adjuvant radiation therapy however declined this after counseling.  Following her initial therapy she developed severe left lower extremity lymphedema.   Follow-up CT scan in March 2016 showed no evidence of disease.  She was followed every 3 months for a total of 5 years with no recurrence appreciated during that time   03/28/2019 Imaging   Ct abdomen and pelvis 1. Enlarged right inguinal lymph node or soft tissue mass, measuring 3.8 x 3.5 cm (series 2, image 69). Additional enlarged right iliac lymph node measuring 1.9 x 1.1 cm (series 2, image 68). Findings are concerning for malignancy.   2. Status post hysterectomy per stated history of endometrial malignancy.   3. No other findings in the abdomen or pelvis concerning for malignancy or metastatic disease.   4. There are multiple subcentimeter low-attenuation lesions of the liver, the largest  in the left lobe the measuring 7 mm, too small to characterize (series 2, image 24). Attention on follow-up although these are most likely incidental benign small cysts and/or hemangiomata.   5.  Aortic  atherosclerosis.   04/10/2019 Pathology Results   A. LYMPH NODE, RIGHT INGUINAL, NEEDLE CORE BIOPSY:  - Metastatic adenocarcinoma.  - See comment.   COMMENT:   The morphology is most consistent with metastatic endometrioid adenocarcinoma.  Immunohistochemistry can be performed if clinically indicated.  There is sufficient tissue for additional testing   04/10/2019 Procedure   Technically successful ultrasound guided biopsy of dominant right inguinal lymph node   04/22/2019 Cancer Staging   Staging form: Corpus Uteri - Carcinoma and Carcinosarcoma, AJCC 8th Edition - Pathologic stage from 04/22/2019: FIGO Stage IVB (rpT3a, pN0, pM1) - Signed by [name], MD on 04/22/2019   04/22/2019 PET scan   1. Two hypermetabolic RIGHT iliac lymph nodes and larger metastatic RIGHT inguinal lymph node. 2. No evidence of metastatic disease outside the pelvis. 3. No clear evidence of activity at the vaginal cuff.   05/15/2019 Procedure   Placement of a subcutaneous port device. Catheter tip in the lower SVC   05/16/2019 - 06/12/2019 Chemotherapy   The patient had cisplatin for chemotherapy treatment.      Genetic Testing   Patient has genetic testing done for MSI. Results revealed patient has the following: MSI Stable   06/23/2019 Imaging   1. Interval decreased size of right inguinal and external iliac lymph nodes. 2. No evidence of progressive metastatic disease. 3. Stable small low-density hepatic lesions, likely cysts. 4. Aortic Atherosclerosis (ICD10-I70.0).     07/14/2019 - 04/06/2020 Chemotherapy   The patient had everolimus and letrozole for chemotherapy treatment.  Treatment was discontinued due to significant side effects    10/20/2019 Imaging   1. No acute abdominal/pelvic findings, mass lesions or adenopathy. 2. Interval resolution of right iliac lymph node. No residual measurable lymph node is identified. Decrease in size of the right inguinal node. 3. Stable scattered hepatic  cysts.   Aortic Atherosclerosis (ICD10-I70.0).   03/08/2020 Imaging   No evidence of recurrent or metastatic carcinoma within the abdomen or pelvis.   Colonic diverticulosis. No radiographic evidence of diverticulitis.   Stable small epigastric ventral hernia containing only fat.     05/31/2020 Imaging   Status post hysterectomy and suspected bilateral salpingo-oophorectomy.   No evidence of recurrent or metastatic disease.   06/22/2020 Procedure   Successful right IJ vein Port-A-Cath explant.     03/11/2021 Imaging   1. There has been interval increase in size of a lymph node or soft tissue mass in the right inguinal station, measuring 3.0 x 2.6 cm, previously 1.9 x 1.7 cm. Interval enlargement of bilateral inguinal lymph nodes. Findings are concerning for nodal metastatic disease. 2. There is a subcutaneous soft tissue nodule of the anterior right thigh measuring 1.6 x 1.0 cm, this level of the thigh not included in the field of view prior examination. This is of uncertain significance, potentially incidental and infectious or inflammatory, however concerning for malignancy given other findings. Consider targeted ultrasound and tissue sampling. 3. Status post hysterectomy and oophorectomy. 4. Small, fat containing midline epigastric hernia.   Aortic Atherosclerosis (ICD10-I70.0).     REVIEW OF SYSTEMS:   Constitutional: Denies fevers, chills or abnormal weight loss Eyes: Denies blurriness of vision Ears, nose, mouth, throat, and face: Denies mucositis or sore throat Respiratory: Denies cough, dyspnea or wheezes Cardiovascular: Denies palpitation, chest discomfort  or lower extremity swelling Gastrointestinal:  Denies nausea, heartburn or change in bowel habits Skin: Denies abnormal skin rashes Lymphatics: Denies new lymphadenopathy or easy bruising Neurological:Denies numbness, tingling or new weaknesses Behavioral/Psych: Mood is stable, no new changes  All other systems were  reviewed with the patient and are negative.  I have reviewed the past medical history, past surgical history, social history and family history with the patient and they are unchanged from previous note.  ALLERGIES:  is allergic to oxycodone, garlic, other, and penicillins.  MEDICATIONS:  Current Outpatient Medications  Medication Sig Dispense Refill   losartan (COZAAR) 25 MG tablet Take 25 mg by mouth daily.     diphenoxylate-atropine (LOMOTIL) 2.5-0.025 MG tablet Take 1 tablet by mouth 4 (four) times daily as needed for diarrhea or loose stools. 90 tablet 0   No current facility-administered medications for this visit.    PHYSICAL EXAMINATION: ECOG PERFORMANCE STATUS: 1 - Symptomatic but completely ambulatory  Vitals:   03/11/21 1007  BP: (!) 154/89  Pulse: 77  Resp: 18  Temp: 97.8 F (36.6 C)  SpO2: 99%   Filed Weights   03/11/21 1007  Weight: 171 lb 12.8 oz (77.9 kg)    GENERAL:alert, no distress and comfortable HEART: She has bilateral lower extremity edema, left greater than right NEURO: alert & oriented x 3 with fluent speech, no focal motor/sensory deficits  LABORATORY DATA:  I have reviewed the data as listed    Component Value Date/Time   NA 138 03/03/2021 1103   K 4.3 03/03/2021 1103   CL 106 03/03/2021 1103   CO2 24 03/03/2021 1103   GLUCOSE 96 03/03/2021 1103   BUN 14 03/03/2021 1103   CREATININE 0.92 03/03/2021 1103   CALCIUM 9.2 03/03/2021 1103   PROT 6.6 03/03/2021 1103   ALBUMIN 3.0 (L) 03/03/2021 1103   AST 11 (L) 03/03/2021 1103   ALT 10 03/03/2021 1103   ALKPHOS 110 03/03/2021 1103   BILITOT 0.2 (L) 03/03/2021 1103   GFRNONAA >60 03/03/2021 1103   GFRAA >60 04/06/2020 0827    No results found for: SPEP, UPEP  Lab Results  Component Value Date   WBC 5.6 03/03/2021   NEUTROABS 3.9 03/03/2021   HGB 12.6 03/03/2021   HCT 38.4 03/03/2021   MCV 93.2 03/03/2021   PLT 343 03/03/2021      Chemistry      Component Value Date/Time   NA  138 03/03/2021 1103   K 4.3 03/03/2021 1103   CL 106 03/03/2021 1103   CO2 24 03/03/2021 1103   BUN 14 03/03/2021 1103   CREATININE 0.92 03/03/2021 1103      Component Value Date/Time   CALCIUM 9.2 03/03/2021 1103   ALKPHOS 110 03/03/2021 1103   AST 11 (L) 03/03/2021 1103   ALT 10 03/03/2021 1103   BILITOT 0.2 (L) 03/03/2021 1103       RADIOGRAPHIC STUDIES: I have reviewed multiple imaging studies with the patient I have personally reviewed the radiological images as listed and agreed with the findings in the report. CT ABDOMEN PELVIS W CONTRAST  Result Date: 03/11/2021 CLINICAL DATA:  Endometrial cancer restaging, worsening left groin pain and leg swelling EXAM: CT ABDOMEN AND PELVIS WITH CONTRAST TECHNIQUE: Multidetector CT imaging of the abdomen and pelvis was performed using the standard protocol following bolus administration of intravenous contrast. CONTRAST:  40m OMNIPAQUE IOHEXOL 350 MG/ML SOLN, additional oral enteric contrast COMPARISON:  05/31/2020 FINDINGS: Lower chest: No acute abnormality.  Small hiatal hernia. Hepatobiliary:  No solid liver abnormality is seen. Stable, benign low-attenuation cysts or hemangiomata of the liver. No gallstones, gallbladder wall thickening, or biliary dilatation. Pancreas: Unremarkable. No pancreatic ductal dilatation or surrounding inflammatory changes. Spleen: Normal in size without significant abnormality. Adrenals/Urinary Tract: Adrenal glands are unremarkable. Kidneys are normal, without renal calculi, solid lesion, or hydronephrosis. Bladder is unremarkable. Stomach/Bowel: Stomach is within normal limits. Appendix is not clearly visualized and may be surgically absent. No evidence of bowel wall thickening, distention, or inflammatory changes. Descending and sigmoid diverticulosis. Vascular/Lymphatic: Aortic atherosclerosis. There has been interval increase in size of a lymph node or soft tissue mass in the right inguinal station, measuring 3.0 x  2.6 cm, previously 1.9 x 1.7 cm (series 2, image 70). Interval enlargement of bilateral inguinal lymph nodes, largest on the left measuring 1.7 x 1.3 cm, previously 0.9 x 0.7 cm (series 2, image 76). Reproductive: Status post hysterectomy and oophorectomy. Other: There is a subcutaneous soft tissue nodule of the anterior right thigh measuring 1.6 x 1.0 cm, this level of the thigh not included in the field of view prior examination (series 2, image 92). Small, fat containing midline epigastric hernia (series 2, image 37). No abdominopelvic ascites. Musculoskeletal: No acute or significant osseous findings. Status post right hip total arthroplasty. IMPRESSION: 1. There has been interval increase in size of a lymph node or soft tissue mass in the right inguinal station, measuring 3.0 x 2.6 cm, previously 1.9 x 1.7 cm. Interval enlargement of bilateral inguinal lymph nodes. Findings are concerning for nodal metastatic disease. 2. There is a subcutaneous soft tissue nodule of the anterior right thigh measuring 1.6 x 1.0 cm, this level of the thigh not included in the field of view prior examination. This is of uncertain significance, potentially incidental and infectious or inflammatory, however concerning for malignancy given other findings. Consider targeted ultrasound and tissue sampling. 3. Status post hysterectomy and oophorectomy. 4. Small, fat containing midline epigastric hernia. Aortic Atherosclerosis (ICD10-I70.0). Electronically Signed   By: Eddie Candle M.D.   On: 03/11/2021 08:30   VAS [place] LOWER EXTREMITY VENOUS (DVT)  Result Date: 02/26/2021  Lower Venous DVT Study Patient Name:  LAURREN LEPKOWSKI Decicco  Date of Exam:   02/25/2021 Medical Rec #: [id number]       Accession #:    [id number] Date of Birth: 1950/11/21       Patient Gender: F Patient Age:   2 years Exam Location:  [facility] Procedure:      VAS [place] LOWER EXTREMITY VENOUS (DVT) Referring Phys: Colletta [place] EDWARDS  --------------------------------------------------------------------------------  Indications: Swelling, and Pain.  Limitations: Extreme edema, patient guarding. Performing Technologist: Ralene Cork RVT  Examination Guidelines: A complete evaluation includes B-mode imaging, spectral Doppler, color Doppler, and power Doppler as needed of all accessible portions of each vessel. Bilateral testing is considered an integral part of a complete examination. Limited examinations for reoccurring indications may be performed as noted. The reflux portion of the exam is performed with the patient in reverse Trendelenburg.  +---------+---------------+---------+-----------+----------+--------------+ LEFT     CompressibilityPhasicitySpontaneityPropertiesThrombus Aging +---------+---------------+---------+-----------+----------+--------------+ CFV      Partial        Yes      Yes                                 +---------+---------------+---------+-----------+----------+--------------+ SFJ      Partial        Yes  Yes                                 +---------+---------------+---------+-----------+----------+--------------+ FV Prox  Full           Yes      Yes                                 +---------+---------------+---------+-----------+----------+--------------+ FV Mid   Full           Yes      Yes                                 +---------+---------------+---------+-----------+----------+--------------+ FV DistalFull           Yes      Yes                                 +---------+---------------+---------+-----------+----------+--------------+ POP      Full           Yes      Yes                                 +---------+---------------+---------+-----------+----------+--------------+ PTV      Full           Yes      Yes                                 +---------+---------------+---------+-----------+----------+--------------+ GSV      Full           Yes       Yes                                 +---------+---------------+---------+-----------+----------+--------------+ Peroneal vein not adequately visualized. Unable to fully compress the CFV and SFJ, possibly due to patient guarding but cannot rule out chronic thrombus.  Findings reported to [place] at 1:45 pm.  Summary: LEFT: - No acute DVT or superficial vein thrombosis seen. Cannot rule out chronic thrombus in the CFV and SFJ, however, incomplete compression of these veins may be due to patient guarding. Patent external iliac vein.  *See table(s) above for measurements and observations. Electronically signed by Harold Barban MD on 02/26/2021 at 1:32:18 PM.    Final

## 2021-03-11 NOTE — Assessment & Plan Note (Signed)
This is chronic in nature I refilled her prescription Lomotil

## 2021-03-11 NOTE — Telephone Encounter (Signed)
Dana [name] and asked to move her appointment today with Dr. Alvy Bimler to 11:40.  She has another appointment at that time but is able to come in at 9:40.  Apt rescheduled to 9:40 today.

## 2021-03-11 NOTE — Assessment & Plan Note (Signed)
Her worsening lymphedema is due to disease relapse She felt better since last time I saw her

## 2021-03-16 ENCOUNTER — Telehealth: Payer: Self-pay | Admitting: Oncology

## 2021-03-16 NOTE — Telephone Encounter (Signed)
Dana [name] called and has decided on carboplatin and paclitaxel for treatment.  She is wondering if she should get a Covid booster before she starts and if her first infusion could be scheduled for the week of 03/28/21.

## 2021-03-18 ENCOUNTER — Encounter: Payer: Self-pay | Admitting: Hematology and Oncology

## 2021-03-18 ENCOUNTER — Other Ambulatory Visit: Payer: Self-pay | Admitting: Hematology and Oncology

## 2021-03-18 DIAGNOSIS — Z7189 Other specified counseling: Secondary | ICD-10-CM

## 2021-03-18 DIAGNOSIS — C55 Malignant neoplasm of uterus, part unspecified: Secondary | ICD-10-CM

## 2021-03-18 NOTE — Progress Notes (Signed)
DISCONTINUE OFF PATHWAY REGIMEN - Uterine   [id number]:Cisplatin 40 mg/m2 IV D1 q7 Days + RT:   A cycle is every 7 days:     Cisplatin   **Always confirm dose/schedule in your pharmacy ordering system**  REASON: Disease Progression PRIOR TREATMENT: Off Pathway: Cisplatin 40 mg/m2 IV D1 q7 Days + RT TREATMENT RESPONSE: Complete Response (CR)  START ON PATHWAY REGIMEN - Uterine     A cycle is every 21 days:     Paclitaxel      Carboplatin   **Always confirm dose/schedule in your pharmacy ordering system**  Patient Characteristics: Endometrioid, Recurrent/Progressive Disease, Second Line, Systemic Recurrence, Relapse ? 12 Months From Prior Therapy Histology: Endometrioid Therapeutic Status: Recurrent or Progressive Disease Line of Therapy: Second Line Time to Recurrence: Relapse ? 12 Months From Prior Therapy Intent of Therapy: Non-Curative / Palliative Intent, Discussed with Patient

## 2021-03-18 NOTE — Telephone Encounter (Signed)
Called Meryem back and advised her of message from Dr. Alvy Bimler.  She will proceed with the booster and is ok with the appointments on 03/25/21 and knows the scheduler will call with the appointment times.

## 2021-03-18 NOTE — Telephone Encounter (Signed)
She can proceed with the booster now 9/12 looks busy, not sure if we can accomodate I need to see her on 9/9 with labs and review side-effects and we will schedule chemo on 9/12 (scheduler will schedule)

## 2021-03-23 ENCOUNTER — Telehealth: Payer: Self-pay

## 2021-03-23 NOTE — Telephone Encounter (Signed)
Returned her call. She is complaining of redness/edema to left leg, more since Tuesday. This morning a abscess area opened up on her leg. White colored drainage with blood came from the area. She applied a dressing and now she is having some yellow colored drainage. Denies fever. She has some left over antibiotics and took them last night.  Instructed to go the the ER to be evaluated. She declined to go to the ER. She may go for worsening symptoms. She will call back in the am to give a update.

## 2021-03-24 ENCOUNTER — Inpatient Hospital Stay: Payer: Medicare HMO | Attending: Hematology and Oncology

## 2021-03-24 ENCOUNTER — Inpatient Hospital Stay: Payer: Medicare HMO | Admitting: Hematology and Oncology

## 2021-03-24 ENCOUNTER — Telehealth: Payer: Self-pay

## 2021-03-24 ENCOUNTER — Encounter: Payer: Self-pay | Admitting: Hematology and Oncology

## 2021-03-24 ENCOUNTER — Other Ambulatory Visit: Payer: Self-pay

## 2021-03-24 VITALS — BP 120/76 | HR 65 | Temp 97.7°F | Resp 18 | Ht 62.0 in | Wt 170.7 lb

## 2021-03-24 DIAGNOSIS — I890 Lymphedema, not elsewhere classified: Secondary | ICD-10-CM | POA: Insufficient documentation

## 2021-03-24 DIAGNOSIS — C55 Malignant neoplasm of uterus, part unspecified: Secondary | ICD-10-CM | POA: Insufficient documentation

## 2021-03-24 DIAGNOSIS — R197 Diarrhea, unspecified: Secondary | ICD-10-CM | POA: Diagnosis not present

## 2021-03-24 DIAGNOSIS — Z79899 Other long term (current) drug therapy: Secondary | ICD-10-CM | POA: Diagnosis not present

## 2021-03-24 DIAGNOSIS — G893 Neoplasm related pain (acute) (chronic): Secondary | ICD-10-CM | POA: Insufficient documentation

## 2021-03-24 DIAGNOSIS — L03116 Cellulitis of left lower limb: Secondary | ICD-10-CM | POA: Diagnosis not present

## 2021-03-24 DIAGNOSIS — Z7189 Other specified counseling: Secondary | ICD-10-CM | POA: Diagnosis not present

## 2021-03-24 DIAGNOSIS — Z5111 Encounter for antineoplastic chemotherapy: Secondary | ICD-10-CM | POA: Diagnosis not present

## 2021-03-24 LAB — CBC WITH DIFFERENTIAL/PLATELET
Abs Immature Granulocytes: 0.01 10*3/uL (ref 0.00–0.07)
Basophils Absolute: 0 10*3/uL (ref 0.0–0.1)
Basophils Relative: 0 %
Eosinophils Absolute: 0.2 10*3/uL (ref 0.0–0.5)
Eosinophils Relative: 4 %
HCT: 36.9 % (ref 36.0–46.0)
Hemoglobin: 12.2 g/dL (ref 12.0–15.0)
Immature Granulocytes: 0 %
Lymphocytes Relative: 21 %
Lymphs Abs: 1.1 10*3/uL (ref 0.7–4.0)
MCH: 30.7 pg (ref 26.0–34.0)
MCHC: 33.1 g/dL (ref 30.0–36.0)
MCV: 92.7 fL (ref 80.0–100.0)
Monocytes Absolute: 0.5 10*3/uL (ref 0.1–1.0)
Monocytes Relative: 10 %
Neutro Abs: 3.4 10*3/uL (ref 1.7–7.7)
Neutrophils Relative %: 65 %
Platelets: 252 10*3/uL (ref 150–400)
RBC: 3.98 MIL/uL (ref 3.87–5.11)
RDW: 13.8 % (ref 11.5–15.5)
WBC: 5.2 10*3/uL (ref 4.0–10.5)
nRBC: 0 % (ref 0.0–0.2)

## 2021-03-24 LAB — COMPREHENSIVE METABOLIC PANEL
ALT: 9 U/L (ref 0–44)
AST: 12 U/L — ABNORMAL LOW (ref 15–41)
Albumin: 3.3 g/dL — ABNORMAL LOW (ref 3.5–5.0)
Alkaline Phosphatase: 101 U/L (ref 38–126)
Anion gap: 10 (ref 5–15)
BUN: 12 mg/dL (ref 8–23)
CO2: 23 mmol/L (ref 22–32)
Calcium: 9.1 mg/dL (ref 8.9–10.3)
Chloride: 107 mmol/L (ref 98–111)
Creatinine, Ser: 0.89 mg/dL (ref 0.44–1.00)
GFR, Estimated: >60 mL/min (ref >60)
Glucose, Bld: 87 mg/dL (ref 70–99)
Potassium: 4.2 mmol/L (ref 3.5–5.1)
Sodium: 140 mmol/L (ref 135–145)
Total Bilirubin: 0.3 mg/dL (ref 0.3–1.2)
Total Protein: 6.6 g/dL (ref 6.5–8.1)

## 2021-03-24 MED ORDER — PROCHLORPERAZINE MALEATE 10 MG PO TABS: 10.0000 mg | ORAL_TABLET | Freq: Four times a day (QID) | ORAL | 1 refills | Status: DC | PRN

## 2021-03-24 MED ORDER — ONDANSETRON HCL 8 MG PO TABS: 8.0000 mg | ORAL_TABLET | Freq: Three times a day (TID) | ORAL | 1 refills | Status: DC | PRN

## 2021-03-24 MED ORDER — SULFAMETHOXAZOLE-TRIMETHOPRIM 800-160 MG PO TABS: 1.0000 | ORAL_TABLET | Freq: Two times a day (BID) | ORAL | 0 refills | Status: DC

## 2021-03-24 NOTE — Assessment & Plan Note (Signed)
She is scheduled to start chemotherapy in 2 weeks In the meantime, I recommend aggressive supportive care to manage her cellulitis I will see her again next week for further follow-up

## 2021-03-24 NOTE — Assessment & Plan Note (Signed)
She has chronic lymphedema, slightly worse since last time I saw her Recommend elevation of legs whenever possible I am hopeful once we start chemotherapy in the near future, the lymphedema will improve

## 2021-03-24 NOTE — Assessment & Plan Note (Signed)
She has recurrent cellulitis on the left leg I recommend a course of Bactrim and will reassess next week

## 2021-03-24 NOTE — Progress Notes (Signed)
[place] OFFICE PROGRESS NOTE  Patient Care Team: Shon Baton, MD as PCP - General (Internal Medicine) Awanda Mink Craige Cotta, RN as Oncology Nurse Navigator (Oncology)  ASSESSMENT & PLAN:  Endometrioid adenocarcinoma of uterus [facility]) She is scheduled to start chemotherapy in 2 weeks In the meantime, I recommend aggressive supportive care to manage [name] cellulitis I will see [name] again next week for further follow-up  Cellulitis of left leg She has recurrent cellulitis on the left leg I recommend a course of Bactrim and will reassess next week  Lymphedema She has chronic lymphedema, slightly worse since last time I saw [name] Recommend elevation of legs whenever possible I am hopeful once we start chemotherapy in the near future, the lymphedema will improve  No orders of the defined types were placed in this encounter.   All questions were answered. The patient knows to call the clinic with any problems, questions or concerns. The total time spent in the appointment was 20 minutes encounter with patients including review of chart and various tests results, discussions about plan of care and coordination of care plan   [name], MD 03/24/2021 2:11 PM  INTERVAL HISTORY: Please see below for problem oriented charting. she returns for urgent evaluation and follow-up for new onset of cellulitis of [name] left lower extremity Is started a few days ago The surrounding skin is profoundly erythematous and swollen She has some leftover antibiotic from previous treatment and took Keflex I believe It got slightly better She has no fever or chills today or yesterday No recent diarrhea  REVIEW OF SYSTEMS:   Constitutional: Denies fevers, chills or abnormal weight loss Eyes: Denies blurriness of vision Ears, nose, mouth, throat, and face: Denies mucositis or sore throat Respiratory: Denies cough, dyspnea or wheezes Cardiovascular: Denies palpitation, chest discomfort  Gastrointestinal:   Denies nausea, heartburn or change in bowel habits Lymphatics: Denies new lymphadenopathy or easy bruising Neurological:Denies numbness, tingling or new weaknesses Behavioral/Psych: Mood is stable, no new changes  All other systems were reviewed with the patient and are negative.  I have reviewed the past medical history, past surgical history, social history and family history with the patient and they are unchanged from previous note.  ALLERGIES:  is allergic to oxycodone, garlic, other, and penicillins.  MEDICATIONS:  Current Outpatient Medications  Medication Sig Dispense Refill   sulfamethoxazole-trimethoprim (BACTRIM DS) 800-160 MG tablet Take 1 tablet by mouth 2 (two) times daily. 14 tablet 0   diphenoxylate-atropine (LOMOTIL) 2.5-0.025 MG tablet Take 1 tablet by mouth 4 (four) times daily as needed for diarrhea or loose stools. 90 tablet 0   losartan (COZAAR) 25 MG tablet Take 25 mg by mouth daily.     ondansetron (ZOFRAN) 8 MG tablet Take 1 tablet (8 mg total) by mouth every 8 (eight) hours as needed. 30 tablet 1   prochlorperazine (COMPAZINE) 10 MG tablet Take 1 tablet (10 mg total) by mouth every 6 (six) hours as needed (Nausea or vomiting). 30 tablet 1   No current facility-administered medications for this visit.    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  The patient's initial surgery was with Dr. Marti Sleigh in [place] on January 05, 2014.  It included an  exploratory laparotomy with TAH, BSO, appendectomy, pelvic and para-aortic lymphadenectomy and wedge resection of a concerning appearing liver lesion.  Preoperatively she presented with a 26 cm right adnexal mass and an elevated Ca1 25 at 404  units/mL.  At the completion of surgery there was no gross residual disease.  Final pathology showed a primary grade 3 endometrial adenocarcinoma (endometrioid) involving both ovaries.  Lymph nodes and other biopsies were negative.  She had uncomplicated postoperative course.  She was treated with 6 cycles of carboplatin paclitaxel chemotherapy.  She was recommended adjuvant radiation therapy however declined this after counseling.  Following [name] initial therapy she developed severe left lower extremity lymphedema.   Follow-up CT scan in March 2016 showed no evidence of disease.  She was followed every 3 months for a total of 5 years with no recurrence appreciated during that time   03/28/2019 Imaging   Ct abdomen and pelvis 1. Enlarged right inguinal lymph node or soft tissue mass, measuring 3.8 x 3.5 cm (series 2, image 69). Additional enlarged right iliac lymph node measuring 1.9 x 1.1 cm (series 2, image 68). Findings are concerning for malignancy.   2. Status post hysterectomy per stated history of endometrial malignancy.   3. No other findings in the abdomen or pelvis concerning for malignancy or metastatic disease.   4. There are multiple subcentimeter low-attenuation lesions of the liver, the largest in the left lobe the measuring 7 mm, too small to characterize (series 2, image 24). Attention on follow-up although these are most likely incidental benign small cysts and/or hemangiomata.   5.  Aortic atherosclerosis.   04/10/2019 Pathology Results   A. LYMPH NODE, RIGHT INGUINAL, NEEDLE CORE BIOPSY:  - Metastatic adenocarcinoma.  - See comment.   COMMENT:   The morphology is most consistent with metastatic endometrioid adenocarcinoma.  Immunohistochemistry can be performed if clinically indicated.  There is sufficient tissue for additional testing   04/10/2019 Procedure   Technically successful ultrasound guided biopsy of dominant right inguinal lymph node   04/22/2019 Cancer Staging    Staging form: Corpus Uteri - Carcinoma and Carcinosarcoma, AJCC 8th Edition - Pathologic stage from 04/22/2019: FIGO Stage IVB (rpT3a, pN0, pM1) - Signed by [name], MD on 04/22/2019   04/22/2019 PET scan   1. Two hypermetabolic RIGHT iliac lymph nodes and larger metastatic RIGHT inguinal lymph node. 2. No evidence of metastatic disease outside the pelvis. 3. No clear evidence of activity at the vaginal cuff.   05/15/2019 Procedure   Placement of a subcutaneous port device. Catheter tip in the lower SVC   05/16/2019 - 06/12/2019 Chemotherapy   The patient had cisplatin for chemotherapy treatment.      Genetic Testing   Patient has genetic testing done for MSI. Results revealed patient has the following: MSI Stable   06/23/2019 Imaging   1. Interval decreased size of right inguinal and external iliac lymph nodes. 2. No evidence of progressive metastatic disease. 3. Stable small low-density hepatic lesions, likely cysts. 4. Aortic Atherosclerosis (ICD10-I70.0).     07/14/2019 - 04/06/2020 Chemotherapy   The patient had everolimus and letrozole for chemotherapy treatment.  Treatment was discontinued due to significant side effects    10/20/2019 Imaging   1. No acute abdominal/pelvic findings, mass lesions or adenopathy. 2. Interval resolution of right iliac lymph node. No residual measurable lymph node is identified. Decrease in size of the right inguinal node. 3. Stable scattered hepatic cysts.   Aortic Atherosclerosis (ICD10-I70.0).   03/08/2020 Imaging  No evidence of recurrent or metastatic carcinoma within the abdomen or pelvis.   Colonic diverticulosis. No radiographic evidence of diverticulitis.   Stable small epigastric ventral hernia containing only fat.     05/31/2020 Imaging   Status post hysterectomy and suspected bilateral salpingo-oophorectomy.   No evidence of recurrent or metastatic disease.   06/22/2020 Procedure   Successful right IJ vein Port-A-Cath  explant.     03/11/2021 Imaging   1. There has been interval increase in size of a lymph node or soft tissue mass in the right inguinal station, measuring 3.0 x 2.6 cm, previously 1.9 x 1.7 cm. Interval enlargement of bilateral inguinal lymph nodes. Findings are concerning for nodal metastatic disease. 2. There is a subcutaneous soft tissue nodule of the anterior right thigh measuring 1.6 x 1.0 cm, this level of the thigh not included in the field of view prior examination. This is of uncertain significance, potentially incidental and infectious or inflammatory, however concerning for malignancy given other findings. Consider targeted ultrasound and tissue sampling. 3. Status post hysterectomy and oophorectomy. 4. Small, fat containing midline epigastric hernia.   Aortic Atherosclerosis (ICD10-I70.0).   03/28/2021 -  Chemotherapy    Patient is on Treatment Plan: UTERINE CARBOPLATIN AUC 6 / PACLITAXEL Q21D         PHYSICAL EXAMINATION: ECOG PERFORMANCE STATUS: 1 - Symptomatic but completely ambulatory  Vitals:   03/24/21 1320  BP: 120/76  Pulse: 65  Resp: 18  Temp: 97.7 F (36.5 C)  SpO2: 98%   Filed Weights   03/24/21 1320  Weight: 170 lb 11.2 oz (77.4 kg)    GENERAL:alert, no distress and comfortable SKIN: Noted significant signs of cellulitis around [name] leg with a open wound on the left lower.  HEART: She has slight worsening left lower extremity edema NEURO: alert & oriented x 3 with fluent speech, no focal motor/sensory deficits  LABORATORY DATA:  I have reviewed the data as listed    Component Value Date/Time   NA 140 03/24/2021 1301   K 4.2 03/24/2021 1301   CL 107 03/24/2021 1301   CO2 23 03/24/2021 1301   GLUCOSE 87 03/24/2021 1301   BUN 12 03/24/2021 1301   CREATININE 0.89 03/24/2021 1301   CALCIUM 9.1 03/24/2021 1301   PROT 6.6 03/24/2021 1301   ALBUMIN 3.3 (L) 03/24/2021 1301   AST 12 (L) 03/24/2021 1301   ALT 9 03/24/2021 1301   ALKPHOS 101  03/24/2021 1301   BILITOT 0.3 03/24/2021 1301   GFRNONAA >60 03/24/2021 1301   GFRAA >60 04/06/2020 0827    No results found for: SPEP, UPEP  Lab Results  Component Value Date   WBC 5.2 03/24/2021   NEUTROABS 3.4 03/24/2021   HGB 12.2 03/24/2021   HCT 36.9 03/24/2021   MCV 92.7 03/24/2021   PLT 252 03/24/2021      Chemistry      Component Value Date/Time   NA 140 03/24/2021 1301   K 4.2 03/24/2021 1301   CL 107 03/24/2021 1301   CO2 23 03/24/2021 1301   BUN 12 03/24/2021 1301   CREATININE 0.89 03/24/2021 1301      Component Value Date/Time   CALCIUM 9.1 03/24/2021 1301   ALKPHOS 101 03/24/2021 1301   AST 12 (L) 03/24/2021 1301   ALT 9 03/24/2021 1301   BILITOT 0.3 03/24/2021 1301

## 2021-03-24 NOTE — Telephone Encounter (Signed)
Called and scheduled appts this afternoon for lab and MD. She is aware of appts.

## 2021-03-25 ENCOUNTER — Inpatient Hospital Stay: Payer: Medicare HMO | Admitting: Hematology and Oncology

## 2021-03-25 ENCOUNTER — Inpatient Hospital Stay: Payer: Medicare HMO

## 2021-03-28 ENCOUNTER — Telehealth: Payer: Self-pay

## 2021-03-28 NOTE — Telephone Encounter (Signed)
-----   Message from [name], MD sent at 03/28/2021  7:30 AM EDT ----- Can you call and ask if her cellulitis is btter?

## 2021-03-28 NOTE — Telephone Encounter (Signed)
Called and given below message. She verbalized understanding. It is better. The area is smaller and still draining. Denies fever and she is still taking the antibiotics.

## 2021-03-28 NOTE — Telephone Encounter (Signed)
OK I will see her end of the week as scheduled

## 2021-03-30 NOTE — Progress Notes (Signed)
Pharmacist Chemotherapy Monitoring - Initial Assessment    Anticipated start date: 04/06/21   The following has been reviewed per standard work regarding the patient's treatment regimen: The patient's diagnosis, treatment plan and drug doses, and organ/hematologic function Lab orders and baseline tests specific to treatment regimen  The treatment plan start date, drug sequencing, and pre-medications Prior authorization status  Patient's documented medication list, including drug-drug interaction screen and prescriptions for anti-emetics and supportive care specific to the treatment regimen The drug concentrations, fluid compatibility, administration routes, and timing of the medications to be used The patient's access for treatment and lifetime cumulative dose history, if applicable  The patient's medication allergies and previous infusion related reactions, if applicable   Changes made to treatment plan:  N/A  Follow up needed:  N/A   Larene Beach, RPH, 03/30/2021  3:45 PM

## 2021-03-31 ENCOUNTER — Inpatient Hospital Stay: Payer: Medicare HMO | Admitting: Hematology and Oncology

## 2021-03-31 ENCOUNTER — Other Ambulatory Visit: Payer: Self-pay

## 2021-03-31 ENCOUNTER — Encounter: Payer: Self-pay | Admitting: Hematology and Oncology

## 2021-03-31 ENCOUNTER — Inpatient Hospital Stay: Payer: Medicare HMO

## 2021-03-31 DIAGNOSIS — Z5111 Encounter for antineoplastic chemotherapy: Secondary | ICD-10-CM | POA: Diagnosis not present

## 2021-03-31 DIAGNOSIS — L03116 Cellulitis of left lower limb: Secondary | ICD-10-CM

## 2021-03-31 DIAGNOSIS — G893 Neoplasm related pain (acute) (chronic): Secondary | ICD-10-CM | POA: Diagnosis not present

## 2021-03-31 DIAGNOSIS — C55 Malignant neoplasm of uterus, part unspecified: Secondary | ICD-10-CM

## 2021-03-31 DIAGNOSIS — Z79899 Other long term (current) drug therapy: Secondary | ICD-10-CM | POA: Diagnosis not present

## 2021-03-31 DIAGNOSIS — R197 Diarrhea, unspecified: Secondary | ICD-10-CM | POA: Diagnosis not present

## 2021-03-31 DIAGNOSIS — I890 Lymphedema, not elsewhere classified: Secondary | ICD-10-CM

## 2021-03-31 LAB — COMPREHENSIVE METABOLIC PANEL
ALT: 12 U/L (ref 0–44)
AST: 13 U/L — ABNORMAL LOW (ref 15–41)
Albumin: 3.6 g/dL (ref 3.5–5.0)
Alkaline Phosphatase: 89 U/L (ref 38–126)
Anion gap: 10 (ref 5–15)
BUN: 11 mg/dL (ref 8–23)
CO2: 23 mmol/L (ref 22–32)
Calcium: 9.3 mg/dL (ref 8.9–10.3)
Chloride: 106 mmol/L (ref 98–111)
Creatinine, Ser: 1.04 mg/dL — ABNORMAL HIGH (ref 0.44–1.00)
GFR, Estimated: 58 mL/min — ABNORMAL LOW (ref >60)
Glucose, Bld: 83 mg/dL (ref 70–99)
Potassium: 4.5 mmol/L (ref 3.5–5.1)
Sodium: 139 mmol/L (ref 135–145)
Total Bilirubin: 0.3 mg/dL (ref 0.3–1.2)
Total Protein: 6.6 g/dL (ref 6.5–8.1)

## 2021-03-31 LAB — CBC WITH DIFFERENTIAL/PLATELET
Abs Immature Granulocytes: 0.01 10*3/uL (ref 0.00–0.07)
Basophils Absolute: 0 10*3/uL (ref 0.0–0.1)
Basophils Relative: 1 %
Eosinophils Absolute: 0.1 10*3/uL (ref 0.0–0.5)
Eosinophils Relative: 3 %
HCT: 40.6 % (ref 36.0–46.0)
Hemoglobin: 13.1 g/dL (ref 12.0–15.0)
Immature Granulocytes: 0 %
Lymphocytes Relative: 24 %
Lymphs Abs: 1.1 10*3/uL (ref 0.7–4.0)
MCH: 30.1 pg (ref 26.0–34.0)
MCHC: 32.3 g/dL (ref 30.0–36.0)
MCV: 93.3 fL (ref 80.0–100.0)
Monocytes Absolute: 0.4 10*3/uL (ref 0.1–1.0)
Monocytes Relative: 9 %
Neutro Abs: 2.8 10*3/uL (ref 1.7–7.7)
Neutrophils Relative %: 63 %
Platelets: 286 10*3/uL (ref 150–400)
RBC: 4.35 MIL/uL (ref 3.87–5.11)
RDW: 14 % (ref 11.5–15.5)
WBC: 4.5 10*3/uL (ref 4.0–10.5)
nRBC: 0 % (ref 0.0–0.2)

## 2021-03-31 MED ORDER — DEXAMETHASONE 4 MG PO TABS: ORAL_TABLET | ORAL | 6 refills | Status: DC

## 2021-03-31 MED ORDER — SULFAMETHOXAZOLE-TRIMETHOPRIM 800-160 MG PO TABS: ORAL_TABLET | ORAL | 0 refills | Status: DC

## 2021-03-31 NOTE — Progress Notes (Signed)
[facility] OFFICE PROGRESS NOTE  Patient Care Team: Shon Baton, MD as PCP - General (Internal Medicine) Awanda Mink Craige Cotta, RN as Oncology Nurse Navigator (Oncology)  ASSESSMENT & PLAN:  Endometrioid adenocarcinoma of uterus [facility]) Her skin is healing well I recommend she continues antibiotics for another month while on treatment I recommend 3 cycles of treatment before repeating imaging studies Reminded her to take premed oral dexamethasone I plan to see her within a week after treatment for further follow-up to check on her wound  Cellulitis of left leg Leg is less swollen and her skin appears to be healing well She will continue twice daily oral antibiotics until Friday After that, she was switched to 3 times a week on Mondays, Wednesdays and Fridays Given her chronic lymphedema, it is going to take a longer period of time to heal while on chemotherapy  Lymphedema We discussed the risk and benefits of diuretic therapy Observe closely for now  No orders of the defined types were placed in this encounter.   All questions were answered. The patient knows to call the clinic with any problems, questions or concerns. The total time spent in the appointment was 20 minutes encounter with patients including review of chart and various tests results, discussions about plan of care and coordination of care plan   [name], MD 03/31/2021 10:27 AM  INTERVAL HISTORY: Please see below for problem oriented charting. she returns for treatment follow-up prior to chemotherapy She had been taking Bactrim only daily, but overall it is improving Leg swelling is better Denies bleeding complications Her chronic diarrhea is stable  REVIEW OF SYSTEMS:   Constitutional: Denies fevers, chills or abnormal weight loss Eyes: Denies blurriness of vision Ears, nose, mouth, throat, and face: Denies mucositis or sore throat Gastrointestinal:  Denies nausea, heartburn or change in bowel  habits Lymphatics: Denies new lymphadenopathy or easy bruising Neurological:Denies numbness, tingling or new weaknesses Behavioral/Psych: Mood is stable, no new changes  All other systems were reviewed with the patient and are negative.  I have reviewed the past medical history, past surgical history, social history and family history with the patient and they are unchanged from previous note.  ALLERGIES:  is allergic to oxycodone, garlic, other, and penicillins.  MEDICATIONS:  Current Outpatient Medications  Medication Sig Dispense Refill   dexamethasone (DECADRON) 4 MG tablet Take 2 tabs at the night before and 2 tab the morning of chemotherapy, every 3 weeks, by mouth x 6 cycles 36 tablet 6   diphenoxylate-atropine (LOMOTIL) 2.5-0.025 MG tablet Take 1 tablet by mouth 4 (four) times daily as needed for diarrhea or loose stools. 90 tablet 0   losartan (COZAAR) 25 MG tablet Take 25 mg by mouth daily.     ondansetron (ZOFRAN) 8 MG tablet Take 1 tablet (8 mg total) by mouth every 8 (eight) hours as needed. 30 tablet 1   prochlorperazine (COMPAZINE) 10 MG tablet Take 1 tablet (10 mg total) by mouth every 6 (six) hours as needed (Nausea or vomiting). 30 tablet 1   sulfamethoxazole-trimethoprim (BACTRIM DS) 800-160 MG tablet Take 1 tablet twice daily by mouth on Mondays, Wednesdays and Fridays only 30 tablet 0   No current facility-administered medications for this visit.    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  The patient's initial surgery was with Dr. Marti Sleigh in [place] on January 05, 2014.  It included an exploratory laparotomy with TAH, BSO, appendectomy, pelvic and para-aortic lymphadenectomy and wedge  resection of a concerning appearing liver lesion.  Preoperatively she presented with a 26 cm right adnexal mass and an elevated Ca1 25 at 404 units/mL.  At the completion of surgery there was no gross residual disease.  Final pathology showed a primary grade 3 endometrial adenocarcinoma (endometrioid) involving both ovaries.  Lymph nodes and other biopsies were negative.  She had uncomplicated postoperative course.  She was treated with 6 cycles of carboplatin paclitaxel chemotherapy.  She was recommended adjuvant radiation therapy however declined this after counseling.  Following her initial therapy she developed severe left lower extremity lymphedema.   Follow-up CT scan in March 2016 showed no evidence of disease.  She was followed every 3 months for a total of 5 years with no recurrence appreciated during that time   03/28/2019 Imaging   Ct abdomen and pelvis 1. Enlarged right inguinal lymph node or soft tissue mass, measuring 3.8 x 3.5 cm (series 2, image 69). Additional enlarged right iliac lymph node measuring 1.9 x 1.1 cm (series 2, image 68). Findings are concerning for malignancy.   2. Status post hysterectomy per stated history of endometrial malignancy.   3. No other findings in the abdomen or pelvis concerning for malignancy or metastatic disease.   4. There are multiple subcentimeter low-attenuation lesions of the liver, the largest in the left lobe the measuring 7 mm, too small to characterize (series 2, image 24). Attention on follow-up although these are most likely incidental benign small cysts and/or hemangiomata.   5.  Aortic atherosclerosis.   04/10/2019 Pathology Results   A. LYMPH NODE, RIGHT INGUINAL, NEEDLE CORE BIOPSY:  - Metastatic adenocarcinoma.  - See comment.   COMMENT:   The morphology is most consistent with metastatic endometrioid adenocarcinoma.  Immunohistochemistry can be performed if clinically indicated.  There is sufficient tissue for additional  testing   04/10/2019 Procedure   Technically successful ultrasound guided biopsy of dominant right inguinal lymph node   04/22/2019 Cancer Staging   Staging form: Corpus Uteri - Carcinoma and Carcinosarcoma, AJCC 8th Edition - Pathologic stage from 04/22/2019: FIGO Stage IVB (rpT3a, pN0, pM1) - Signed by [name], MD on 04/22/2019   04/22/2019 PET scan   1. Two hypermetabolic RIGHT iliac lymph nodes and larger metastatic RIGHT inguinal lymph node. 2. No evidence of metastatic disease outside the pelvis. 3. No clear evidence of activity at the vaginal cuff.   05/15/2019 Procedure   Placement of a subcutaneous port device. Catheter tip in the lower SVC   05/16/2019 - 06/12/2019 Chemotherapy   The patient had cisplatin for chemotherapy treatment.      Genetic Testing   Patient has genetic testing done for MSI. Results revealed patient has the following: MSI Stable   06/23/2019 Imaging   1. Interval decreased size of right inguinal and external iliac lymph nodes. 2. No evidence of progressive metastatic disease. 3. Stable small low-density hepatic lesions, likely cysts. 4. Aortic Atherosclerosis (ICD10-I70.0).     07/14/2019 - 04/06/2020 Chemotherapy   The patient had everolimus and letrozole for chemotherapy treatment.  Treatment was discontinued due to significant side effects    10/20/2019 Imaging   1. No acute abdominal/pelvic findings, mass lesions or adenopathy. 2. Interval resolution of right iliac lymph  node. No residual measurable lymph node is identified. Decrease in size of the right inguinal node. 3. Stable scattered hepatic cysts.   Aortic Atherosclerosis (ICD10-I70.0).   03/08/2020 Imaging   No evidence of recurrent or metastatic carcinoma within the abdomen or pelvis.   Colonic diverticulosis. No radiographic evidence of diverticulitis.   Stable small epigastric ventral hernia containing only fat.     05/31/2020 Imaging   Status post hysterectomy and  suspected bilateral salpingo-oophorectomy.   No evidence of recurrent or metastatic disease.   06/22/2020 Procedure   Successful right IJ vein Port-A-Cath explant.     03/11/2021 Imaging   1. There has been interval increase in size of a lymph node or soft tissue mass in the right inguinal station, measuring 3.0 x 2.6 cm, previously 1.9 x 1.7 cm. Interval enlargement of bilateral inguinal lymph nodes. Findings are concerning for nodal metastatic disease. 2. There is a subcutaneous soft tissue nodule of the anterior right thigh measuring 1.6 x 1.0 cm, this level of the thigh not included in the field of view prior examination. This is of uncertain significance, potentially incidental and infectious or inflammatory, however concerning for malignancy given other findings. Consider targeted ultrasound and tissue sampling. 3. Status post hysterectomy and oophorectomy. 4. Small, fat containing midline epigastric hernia.   Aortic Atherosclerosis (ICD10-I70.0).   04/06/2021 -  Chemotherapy    Patient is on Treatment Plan: UTERINE CARBOPLATIN AUC 6 / PACLITAXEL Q21D         PHYSICAL EXAMINATION: ECOG PERFORMANCE STATUS: 1 - Symptomatic but completely ambulatory  Vitals:   03/31/21 0822  BP: 131/69  Pulse: 65  Resp: 16  Temp: (!) 97 F (36.1 C)  SpO2: 100%   Filed Weights   03/31/21 0822  Weight: 168 lb 3.2 oz (76.3 kg)    GENERAL:alert, no distress and comfortable SKIN: The skin on the left lower extremity is healing well HEART: regular rate & rhythm and no murmurs with persistent bilateral lower extremity edema NEURO: alert & oriented x 3 with fluent speech, no focal motor/sensory deficits  LABORATORY DATA:  I have reviewed the data as listed    Component Value Date/Time   NA 139 03/31/2021 0811   K 4.5 03/31/2021 0811   CL 106 03/31/2021 0811   CO2 23 03/31/2021 0811   GLUCOSE 83 03/31/2021 0811   BUN 11 03/31/2021 0811   CREATININE 1.04 (H) 03/31/2021 0811   CALCIUM  9.3 03/31/2021 0811   PROT 6.6 03/31/2021 0811   ALBUMIN 3.6 03/31/2021 0811   AST 13 (L) 03/31/2021 0811   ALT 12 03/31/2021 0811   ALKPHOS 89 03/31/2021 0811   BILITOT 0.3 03/31/2021 0811   GFRNONAA 58 (L) 03/31/2021 0811   GFRAA >60 04/06/2020 0827    No results found for: SPEP, UPEP  Lab Results  Component Value Date   WBC 4.5 03/31/2021   NEUTROABS 2.8 03/31/2021   HGB 13.1 03/31/2021   HCT 40.6 03/31/2021   MCV 93.3 03/31/2021   PLT 286 03/31/2021      Chemistry      Component Value Date/Time   NA 139 03/31/2021 0811   K 4.5 03/31/2021 0811   CL 106 03/31/2021 0811   CO2 23 03/31/2021 0811   BUN 11 03/31/2021 0811   CREATININE 1.04 (H) 03/31/2021 0811      Component Value Date/Time   CALCIUM 9.3 03/31/2021 0811   ALKPHOS 89 03/31/2021 0811   AST 13 (L) 03/31/2021 0811   ALT 12 03/31/2021  0737   BILITOT 0.3 03/31/2021 0811       RADIOGRAPHIC STUDIES: I have personally reviewed the radiological images as listed and agreed with the findings in the report. CT ABDOMEN PELVIS W CONTRAST  Result Date: 03/11/2021 CLINICAL DATA:  Endometrial cancer restaging, worsening left groin pain and leg swelling EXAM: CT ABDOMEN AND PELVIS WITH CONTRAST TECHNIQUE: Multidetector CT imaging of the abdomen and pelvis was performed using the standard protocol following bolus administration of intravenous contrast. CONTRAST:  63m OMNIPAQUE IOHEXOL 350 MG/ML SOLN, additional oral enteric contrast COMPARISON:  05/31/2020 FINDINGS: Lower chest: No acute abnormality.  Small hiatal hernia. Hepatobiliary: No solid liver abnormality is seen. Stable, benign low-attenuation cysts or hemangiomata of the liver. No gallstones, gallbladder wall thickening, or biliary dilatation. Pancreas: Unremarkable. No pancreatic ductal dilatation or surrounding inflammatory changes. Spleen: Normal in size without significant abnormality. Adrenals/Urinary Tract: Adrenal glands are unremarkable. Kidneys are normal,  without renal calculi, solid lesion, or hydronephrosis. Bladder is unremarkable. Stomach/Bowel: Stomach is within normal limits. Appendix is not clearly visualized and may be surgically absent. No evidence of bowel wall thickening, distention, or inflammatory changes. Descending and sigmoid diverticulosis. Vascular/Lymphatic: Aortic atherosclerosis. There has been interval increase in size of a lymph node or soft tissue mass in the right inguinal station, measuring 3.0 x 2.6 cm, previously 1.9 x 1.7 cm (series 2, image 70). Interval enlargement of bilateral inguinal lymph nodes, largest on the left measuring 1.7 x 1.3 cm, previously 0.9 x 0.7 cm (series 2, image 76). Reproductive: Status post hysterectomy and oophorectomy. Other: There is a subcutaneous soft tissue nodule of the anterior right thigh measuring 1.6 x 1.0 cm, this level of the thigh not included in the field of view prior examination (series 2, image 92). Small, fat containing midline epigastric hernia (series 2, image 37). No abdominopelvic ascites. Musculoskeletal: No acute or significant osseous findings. Status post right hip total arthroplasty. IMPRESSION: 1. There has been interval increase in size of a lymph node or soft tissue mass in the right inguinal station, measuring 3.0 x 2.6 cm, previously 1.9 x 1.7 cm. Interval enlargement of bilateral inguinal lymph nodes. Findings are concerning for nodal metastatic disease. 2. There is a subcutaneous soft tissue nodule of the anterior right thigh measuring 1.6 x 1.0 cm, this level of the thigh not included in the field of view prior examination. This is of uncertain significance, potentially incidental and infectious or inflammatory, however concerning for malignancy given other findings. Consider targeted ultrasound and tissue sampling. 3. Status post hysterectomy and oophorectomy. 4. Small, fat containing midline epigastric hernia. Aortic Atherosclerosis (ICD10-I70.0). Electronically Signed   By:  AEddie CandleM.D.   On: 03/11/2021 08:30

## 2021-03-31 NOTE — Assessment & Plan Note (Signed)
Her skin is healing well I recommend she continues antibiotics for another month while on treatment I recommend 3 cycles of treatment before repeating imaging studies Reminded her to take premed oral dexamethasone I plan to see her within a week after treatment for further follow-up to check on her wound

## 2021-03-31 NOTE — Assessment & Plan Note (Signed)
Leg is less swollen and her skin appears to be healing well She will continue twice daily oral antibiotics until Friday After that, she was switched to 3 times a week on Mondays, Wednesdays and Fridays Given her chronic lymphedema, it is going to take a longer period of time to heal while on chemotherapy

## 2021-03-31 NOTE — Assessment & Plan Note (Signed)
We discussed the risk and benefits of diuretic therapy Observe closely for now

## 2021-04-05 MED FILL — Fosaprepitant Dimeglumine For IV Infusion 150 MG (Base Eq): INTRAVENOUS | Qty: 5 | Status: AC

## 2021-04-05 MED FILL — Dexamethasone Sodium Phosphate Inj 100 MG/10ML: INTRAMUSCULAR | Qty: 1 | Status: AC

## 2021-04-06 ENCOUNTER — Inpatient Hospital Stay: Payer: Medicare HMO

## 2021-04-06 ENCOUNTER — Other Ambulatory Visit: Payer: Self-pay

## 2021-04-06 VITALS — BP 120/70 | HR 62 | Temp 98.2°F | Resp 16 | Wt 167.5 lb

## 2021-04-06 DIAGNOSIS — R197 Diarrhea, unspecified: Secondary | ICD-10-CM | POA: Diagnosis not present

## 2021-04-06 DIAGNOSIS — Z5111 Encounter for antineoplastic chemotherapy: Secondary | ICD-10-CM | POA: Diagnosis not present

## 2021-04-06 DIAGNOSIS — Z7189 Other specified counseling: Secondary | ICD-10-CM

## 2021-04-06 DIAGNOSIS — C55 Malignant neoplasm of uterus, part unspecified: Secondary | ICD-10-CM | POA: Diagnosis not present

## 2021-04-06 DIAGNOSIS — Z79899 Other long term (current) drug therapy: Secondary | ICD-10-CM | POA: Diagnosis not present

## 2021-04-06 DIAGNOSIS — L03116 Cellulitis of left lower limb: Secondary | ICD-10-CM | POA: Diagnosis not present

## 2021-04-06 DIAGNOSIS — G893 Neoplasm related pain (acute) (chronic): Secondary | ICD-10-CM | POA: Diagnosis not present

## 2021-04-06 DIAGNOSIS — I890 Lymphedema, not elsewhere classified: Secondary | ICD-10-CM | POA: Diagnosis not present

## 2021-04-06 MED ORDER — FAMOTIDINE 20 MG IN NS 100 ML IVPB
20.0000 mg | Freq: Once | INTRAVENOUS | Status: AC
  Administered 2021-04-06: 20 mg via INTRAVENOUS
  Filled 2021-04-06: qty 100

## 2021-04-06 MED ORDER — SODIUM CHLORIDE 0.9 % IV SOLN
150.0000 mg | Freq: Once | INTRAVENOUS | Status: AC
  Administered 2021-04-06: 150 mg via INTRAVENOUS
  Filled 2021-04-06: qty 150

## 2021-04-06 MED ORDER — DIPHENHYDRAMINE HCL 50 MG/ML IJ SOLN
25.0000 mg | Freq: Once | INTRAMUSCULAR | Status: AC
  Administered 2021-04-06: 25 mg via INTRAVENOUS
  Filled 2021-04-06: qty 1

## 2021-04-06 MED ORDER — SODIUM CHLORIDE 0.9 % IV SOLN
140.0000 mg/m2 | Freq: Once | INTRAVENOUS | Status: AC
  Administered 2021-04-06: 258 mg via INTRAVENOUS
  Filled 2021-04-06: qty 43

## 2021-04-06 MED ORDER — SODIUM CHLORIDE 0.9 % IV SOLN
10.0000 mg | Freq: Once | INTRAVENOUS | Status: AC
  Administered 2021-04-06: 10 mg via INTRAVENOUS
  Filled 2021-04-06: qty 10

## 2021-04-06 MED ORDER — SODIUM CHLORIDE 0.9 % IV SOLN
432.5000 mg | Freq: Once | INTRAVENOUS | Status: AC
  Administered 2021-04-06: 430 mg via INTRAVENOUS
  Filled 2021-04-06: qty 43

## 2021-04-06 MED ORDER — PALONOSETRON HCL INJECTION 0.25 MG/5ML
0.2500 mg | Freq: Once | INTRAVENOUS | Status: AC
  Administered 2021-04-06: 0.25 mg via INTRAVENOUS
  Filled 2021-04-06: qty 5

## 2021-04-06 MED ORDER — SODIUM CHLORIDE 0.9 % IV SOLN: Freq: Once | INTRAVENOUS | Status: AC

## 2021-04-06 NOTE — Patient Instructions (Signed)
[facility]  Discharge Instructions: Thank you for choosing [facility] to provide your oncology and hematology care.   If you have a lab appointment with the [facility], please go directly to the [facility] and check in at the registration area.   Wear comfortable clothing and clothing appropriate for easy access to any Portacath or PICC line.   We strive to give you quality time with your provider. You may need to reschedule your appointment if you arrive late (15 or more minutes).  Arriving late affects you and other patients whose appointments are after yours.  Also, if you miss three or more appointments without notifying the office, you may be dismissed from the clinic at the provider's discretion.      For prescription refill requests, have your pharmacy contact our office and allow 72 hours for refills to be completed.    Today you received the following chemotherapy and/or immunotherapy agents: Taxol,& Carboplatin   To help prevent nausea and vomiting after your treatment, we encourage you to take your nausea medication as directed.  BELOW ARE SYMPTOMS THAT SHOULD BE REPORTED IMMEDIATELY: *FEVER GREATER THAN 100.4 F (38 C) OR HIGHER *CHILLS OR SWEATING *NAUSEA AND VOMITING THAT IS NOT CONTROLLED WITH YOUR NAUSEA MEDICATION *UNUSUAL SHORTNESS OF BREATH *UNUSUAL BRUISING OR BLEEDING *URINARY PROBLEMS (pain or burning when urinating, or frequent urination) *BOWEL PROBLEMS (unusual diarrhea, constipation, pain near the anus) TENDERNESS IN MOUTH AND THROAT WITH OR WITHOUT PRESENCE OF ULCERS (sore throat, sores in mouth, or a toothache) UNUSUAL RASH, SWELLING OR PAIN  UNUSUAL VAGINAL DISCHARGE OR ITCHING   Items with * indicate a potential emergency and should be followed up as soon as possible or go to the Emergency Department if any problems should occur.  Please show the CHEMOTHERAPY ALERT CARD or IMMUNOTHERAPY ALERT CARD at  check-in to the Emergency Department and triage nurse.  Should you have questions after your visit or need to cancel or reschedule your appointment, please contact [facility]  Dept: [phone]  and follow the prompts.  Office hours are 8:00 a.m. to 4:30 p.m. Monday - Friday. Please note that voicemails left after 4:00 p.m. may not be returned until the following business day.  We are closed weekends and major holidays. You have access to a nurse at all times for urgent questions. Please call the main number to the clinic Dept: [phone] and follow the prompts.   For any non-urgent questions, you may also contact your provider using MyChart. We now offer e-Visits for anyone 78 and older to request care online for non-urgent symptoms. For details visit [email or web address].   Also download the MyChart app! Go to the app store, search "MyChart", open the app, select [facility], and log in with your MyChart username and password.  Due to Covid, a mask is required upon entering the hospital/clinic. If you do not have a mask, one will be given to you upon arrival. For doctor visits, patients may have 1 support person aged 51 or older with them. For treatment visits, patients cannot have anyone with them due to current Covid guidelines and our immunocompromised population.   Paclitaxel injection What is this medication? PACLITAXEL (PAK li TAX el) is a chemotherapy drug. It targets fast dividing cells, like cancer cells, and causes these cells to die. This medicine is used to treat ovarian cancer, breast cancer, lung cancer, Kaposi's sarcoma, and other cancers. This medicine may be used  for other purposes; ask your health care provider or pharmacist if you have questions. COMMON BRAND NAME(S): Onxol, Taxol What should I tell my care team before I take this medication? They need to know if you have any of these conditions: history of irregular heartbeat liver  disease low blood counts, like low white cell, platelet, or red cell counts lung or breathing disease, like asthma tingling of the fingers or toes, or other nerve disorder an unusual or allergic reaction to paclitaxel, alcohol, polyoxyethylated castor oil, other chemotherapy, other medicines, foods, dyes, or preservatives pregnant or trying to get pregnant breast-feeding How should I use this medication? This drug is given as an infusion into a vein. It is administered in a hospital or clinic by a specially trained health care professional. Talk to your pediatrician regarding the use of this medicine in children. Special care may be needed. Overdosage: If you think you have taken too much of this medicine contact a poison control center or emergency room at once. NOTE: This medicine is only for you. Do not share this medicine with others. What if I miss a dose? It is important not to miss your dose. Call your doctor or health care professional if you are unable to keep an appointment. What may interact with this medication? Do not take this medicine with any of the following medications: live virus vaccines This medicine may also interact with the following medications: antiviral medicines for hepatitis, HIV or AIDS certain antibiotics like erythromycin and clarithromycin certain medicines for fungal infections like ketoconazole and itraconazole certain medicines for seizures like carbamazepine, phenobarbital, phenytoin gemfibrozil nefazodone rifampin St. John's wort This list may not describe all possible interactions. Give your health care provider a list of all the medicines, herbs, non-prescription drugs, or dietary supplements you use. Also tell them if you smoke, drink alcohol, or use illegal drugs. Some items may interact with your medicine. What should I watch for while using this medication? Your condition will be monitored carefully while you are receiving this medicine. You  will need important blood work done while you are taking this medicine. This medicine can cause serious allergic reactions. To reduce your risk you will need to take other medicine(s) before treatment with this medicine. If you experience allergic reactions like skin rash, itching or hives, swelling of the face, lips, or tongue, tell your doctor or health care professional right away. In some cases, you may be given additional medicines to help with side effects. Follow all directions for their use. This drug may make you feel generally unwell. This is not uncommon, as chemotherapy can affect healthy cells as well as cancer cells. Report any side effects. Continue your course of treatment even though you feel ill unless your doctor tells you to stop. Call your doctor or health care professional for advice if you get a fever, chills or sore throat, or other symptoms of a cold or flu. Do not treat yourself. This drug decreases your body's ability to fight infections. Try to avoid being around people who are sick. This medicine may increase your risk to bruise or bleed. Call your doctor or health care professional if you notice any unusual bleeding. Be careful brushing and flossing your teeth or using a toothpick because you may get an infection or bleed more easily. If you have any dental work done, tell your dentist you are receiving this medicine. Avoid taking products that contain aspirin, acetaminophen, ibuprofen, naproxen, or ketoprofen unless instructed by your doctor. These  medicines may hide a fever. Do not become pregnant while taking this medicine. Women should inform their doctor if they wish to become pregnant or think they might be pregnant. There is a potential for serious side effects to an unborn child. Talk to your health care professional or pharmacist for more information. Do not breast-feed an infant while taking this medicine. Men are advised not to father a child while receiving this  medicine. This product may contain alcohol. Ask your pharmacist or healthcare provider if this medicine contains alcohol. Be sure to tell all healthcare providers you are taking this medicine. Certain medicines, like metronidazole and disulfiram, can cause an unpleasant reaction when taken with alcohol. The reaction includes flushing, headache, nausea, vomiting, sweating, and increased thirst. The reaction can last from 30 minutes to several hours. What side effects may I notice from receiving this medication? Side effects that you should report to your doctor or health care professional as soon as possible: allergic reactions like skin rash, itching or hives, swelling of the face, lips, or tongue breathing problems changes in vision fast, irregular heartbeat high or low blood pressure mouth sores pain, tingling, numbness in the hands or feet signs of decreased platelets or bleeding - bruising, pinpoint red spots on the skin, black, tarry stools, blood in the urine signs of decreased red blood cells - unusually weak or tired, feeling faint or lightheaded, falls signs of infection - fever or chills, cough, sore throat, pain or difficulty passing urine signs and symptoms of liver injury like dark yellow or brown urine; general ill feeling or flu-like symptoms; light-colored stools; loss of appetite; nausea; right upper belly pain; unusually weak or tired; yellowing of the eyes or skin swelling of the ankles, feet, hands unusually slow heartbeat Side effects that usually do not require medical attention (report to your doctor or health care professional if they continue or are bothersome): diarrhea hair loss loss of appetite muscle or joint pain nausea, vomiting pain, redness, or irritation at site where injected tiredness This list may not describe all possible side effects. Call your doctor for medical advice about side effects. You may report side effects to FDA at 1-800-FDA-1088. Where  should I keep my medication? This drug is given in a hospital or clinic and will not be stored at home. NOTE: This sheet is a summary. It may not cover all possible information. If you have questions about this medicine, talk to your doctor, pharmacist, or health care provider.  2022 Elsevier/Gold Standard (2019-06-04 13:37:23)  Carboplatin injection What is this medication? CARBOPLATIN (KAR boe pla tin) is a chemotherapy drug. It targets fast dividing cells, like cancer cells, and causes these cells to die. This medicine is used to treat ovarian cancer and many other cancers. This medicine may be used for other purposes; ask your health care provider or pharmacist if you have questions. COMMON BRAND NAME(S): Paraplatin What should I tell my care team before I take this medication? They need to know if you have any of these conditions: blood disorders hearing problems kidney disease recent or ongoing radiation therapy an unusual or allergic reaction to carboplatin, cisplatin, other chemotherapy, other medicines, foods, dyes, or preservatives pregnant or trying to get pregnant breast-feeding How should I use this medication? This drug is usually given as an infusion into a vein. It is administered in a hospital or clinic by a specially trained health care professional. Talk to your pediatrician regarding the use of this medicine in children. Special care  may be needed. Overdosage: If you think you have taken too much of this medicine contact a poison control center or emergency room at once. NOTE: This medicine is only for you. Do not share this medicine with others. What if I miss a dose? It is important not to miss a dose. Call your doctor or health care professional if you are unable to keep an appointment. What may interact with this medication? medicines for seizures medicines to increase blood counts like filgrastim, pegfilgrastim, sargramostim some antibiotics like amikacin,  gentamicin, neomycin, streptomycin, tobramycin vaccines Talk to your doctor or health care professional before taking any of these medicines: acetaminophen aspirin ibuprofen ketoprofen naproxen This list may not describe all possible interactions. Give your health care provider a list of all the medicines, herbs, non-prescription drugs, or dietary supplements you use. Also tell them if you smoke, drink alcohol, or use illegal drugs. Some items may interact with your medicine. What should I watch for while using this medication? Your condition will be monitored carefully while you are receiving this medicine. You will need important blood work done while you are taking this medicine. This drug may make you feel generally unwell. This is not uncommon, as chemotherapy can affect healthy cells as well as cancer cells. Report any side effects. Continue your course of treatment even though you feel ill unless your doctor tells you to stop. In some cases, you may be given additional medicines to help with side effects. Follow all directions for their use. Call your doctor or health care professional for advice if you get a fever, chills or sore throat, or other symptoms of a cold or flu. Do not treat yourself. This drug decreases your body's ability to fight infections. Try to avoid being around people who are sick. This medicine may increase your risk to bruise or bleed. Call your doctor or health care professional if you notice any unusual bleeding. Be careful brushing and flossing your teeth or using a toothpick because you may get an infection or bleed more easily. If you have any dental work done, tell your dentist you are receiving this medicine. Avoid taking products that contain aspirin, acetaminophen, ibuprofen, naproxen, or ketoprofen unless instructed by your doctor. These medicines may hide a fever. Do not become pregnant while taking this medicine. Women should inform their doctor if they wish  to become pregnant or think they might be pregnant. There is a potential for serious side effects to an unborn child. Talk to your health care professional or pharmacist for more information. Do not breast-feed an infant while taking this medicine. What side effects may I notice from receiving this medication? Side effects that you should report to your doctor or health care professional as soon as possible: allergic reactions like skin rash, itching or hives, swelling of the face, lips, or tongue signs of infection - fever or chills, cough, sore throat, pain or difficulty passing urine signs of decreased platelets or bleeding - bruising, pinpoint red spots on the skin, black, tarry stools, nosebleeds signs of decreased red blood cells - unusually weak or tired, fainting spells, lightheadedness breathing problems changes in hearing changes in vision chest pain high blood pressure low blood counts - This drug may decrease the number of white blood cells, red blood cells and platelets. You may be at increased risk for infections and bleeding. nausea and vomiting pain, swelling, redness or irritation at the injection site pain, tingling, numbness in the hands or feet problems with balance,  talking, walking trouble passing urine or change in the amount of urine Side effects that usually do not require medical attention (report to your doctor or health care professional if they continue or are bothersome): hair loss loss of appetite metallic taste in the mouth or changes in taste This list may not describe all possible side effects. Call your doctor for medical advice about side effects. You may report side effects to FDA at 1-800-FDA-1088. Where should I keep my medication? This drug is given in a hospital or clinic and will not be stored at home. NOTE: This sheet is a summary. It may not cover all possible information. If you have questions about this medicine, talk to your doctor, pharmacist,  or health care provider.  2022 Elsevier/Gold Standard (2007-10-08 14:38:05)

## 2021-04-08 ENCOUNTER — Telehealth: Payer: Self-pay

## 2021-04-08 NOTE — Telephone Encounter (Signed)
RN placed call to follow up with swelling to leg/wound per MD request.  Pt states, "it is doing well."  Denies any fevers, no increase in swelling.  Denies any drainage to wound.  Patient is s/p D1C1 Taxol and Carboplatin.  Denies any nausea or vomiting, no fevers.  RN educated on side effects to monitor, and educated on use to anti-emetics.   No further needs at this time.

## 2021-04-11 ENCOUNTER — Telehealth: Payer: Self-pay | Admitting: Oncology

## 2021-04-11 NOTE — Telephone Encounter (Signed)
Called [facility] and moved up her appointment with Dr. Alvy Bimler tomorrow to 10:40.

## 2021-04-12 ENCOUNTER — Inpatient Hospital Stay: Payer: Medicare HMO | Admitting: Hematology and Oncology

## 2021-04-12 ENCOUNTER — Encounter: Payer: Self-pay | Admitting: Hematology and Oncology

## 2021-04-12 ENCOUNTER — Other Ambulatory Visit: Payer: Self-pay

## 2021-04-12 VITALS — BP 119/72 | HR 71 | Temp 97.9°F | Resp 18 | Ht 62.0 in | Wt 164.4 lb

## 2021-04-12 DIAGNOSIS — I890 Lymphedema, not elsewhere classified: Secondary | ICD-10-CM | POA: Diagnosis not present

## 2021-04-12 DIAGNOSIS — G620 Drug-induced polyneuropathy: Secondary | ICD-10-CM | POA: Diagnosis not present

## 2021-04-12 DIAGNOSIS — G893 Neoplasm related pain (acute) (chronic): Secondary | ICD-10-CM

## 2021-04-12 DIAGNOSIS — L03116 Cellulitis of left lower limb: Secondary | ICD-10-CM | POA: Diagnosis not present

## 2021-04-12 DIAGNOSIS — Z79899 Other long term (current) drug therapy: Secondary | ICD-10-CM | POA: Diagnosis not present

## 2021-04-12 DIAGNOSIS — R197 Diarrhea, unspecified: Secondary | ICD-10-CM

## 2021-04-12 DIAGNOSIS — T451X5A Adverse effect of antineoplastic and immunosuppressive drugs, initial encounter: Secondary | ICD-10-CM

## 2021-04-12 DIAGNOSIS — C55 Malignant neoplasm of uterus, part unspecified: Secondary | ICD-10-CM | POA: Diagnosis not present

## 2021-04-12 DIAGNOSIS — R112 Nausea with vomiting, unspecified: Secondary | ICD-10-CM

## 2021-04-12 DIAGNOSIS — Z5111 Encounter for antineoplastic chemotherapy: Secondary | ICD-10-CM | POA: Diagnosis not present

## 2021-04-12 MED ORDER — ACETAMINOPHEN-CODEINE #3 300-30 MG PO TABS: 1.0000 | ORAL_TABLET | ORAL | 0 refills | Status: DC | PRN

## 2021-04-12 NOTE — Assessment & Plan Note (Signed)
She has severe uncontrolled diarrhea The patient felt she can hydrate adequately I recommend scheduled Imodium along with Lomotil as needed

## 2021-04-12 NOTE — Assessment & Plan Note (Signed)
This is resolved I will discontinue Bactrim

## 2021-04-12 NOTE — Assessment & Plan Note (Signed)
She has severe uncontrolled neuropathic pain due to Taxol As discussed above, we are in agreement to discontinue Taxol I recommend the patient to take gabapentin which seems to help

## 2021-04-12 NOTE — Assessment & Plan Note (Signed)
I recommend antiemetics as needed The patient does not think she needs IV fluids or IV antiemetics today

## 2021-04-12 NOTE — Assessment & Plan Note (Signed)
She has diffuse musculoskeletal pain, likely exacerbated by Taxol I recommend low-dose pain medicine The patient is willing to try Tylenol 3 She wants to refrain from taking narcotic prescription which she tolerated very poorly

## 2021-04-12 NOTE — Progress Notes (Signed)
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[facility]) She tolerated treatment very poorly with severe nausea, diarrhea, uncontrolled neuropathic pain and weakness, along with bone pain We discussed the risk and benefits of treatment approach Ultimately, she has made informed decision to discontinue Taxol and proceed with single agent carboplatin only I will make an adjustment to her treatment plan and hopefully we can move all her treatment to be given in 1 day In the meantime, we will continue supportive care I will get my nursing staff to check on her at the end of the week  Peripheral neuropathy due to chemotherapy [facility]) She has severe uncontrolled neuropathic pain due to Taxol As discussed above, we are in agreement to discontinue Taxol I recommend the patient to take gabapentin which seems to help  Diarrhea She has severe uncontrolled diarrhea The patient felt she can hydrate adequately I recommend scheduled Imodium along with Lomotil as needed  Cellulitis of left leg This is resolved I will discontinue Bactrim  Cancer associated pain She has diffuse musculoskeletal pain, likely exacerbated by Taxol I recommend low-dose pain medicine The patient is willing to try Tylenol 3 She wants to refrain from taking narcotic prescription which she tolerated very poorly  Nausea with vomiting I recommend antiemetics as needed The patient does not think she needs IV fluids or IV antiemetics today  No orders of the defined types were placed in this encounter.   All questions were answered. The patient knows to call the clinic with any problems, questions or concerns. The total time spent in the appointment was 40 minutes encounter with patients including review of chart and various tests results,  discussions about plan of care and coordination of care plan   [name], MD 04/12/2021 1:51 PM  INTERVAL HISTORY: Please see below for problem oriented charting. she returns for follow-up on recent treatment for recurrent uterine cancer She tolerated treatment very poorly She has lost some weight due to nausea, vomiting as well as uncontrolled diarrhea She is also weak and have diffuse bone pain She has severe neuropathy pain with burning sensation in the constant nature in her feet Her cellulitis is resolved  REVIEW OF SYSTEMS:   Constitutional: Denies fevers, chills  Eyes: Denies blurriness of vision Ears, nose, mouth, throat, and face: Denies mucositis or sore throat Respiratory: Denies cough, dyspnea or wheezes Cardiovascular: Denies palpitation, chest discomfort or lower extremity swelling Skin: Denies abnormal skin rashes Behavioral/Psych: Mood is stable, no new changes  All other systems were reviewed with the patient and are negative.  I have reviewed the past medical history, past surgical history, social history and family history with the patient and they are unchanged from previous note.  ALLERGIES:  is allergic to oxycodone, garlic, other, and penicillins.  MEDICATIONS:  Current Outpatient Medications  Medication Sig Dispense Refill   acetaminophen-codeine (TYLENOL #3) 300-30 MG tablet Take 1 tablet by mouth every 4 (four) hours as needed for moderate pain. 30 tablet 0   gabapentin (NEURONTIN) 300 MG capsule Take 300 mg by mouth 2 (two) times daily.     dexamethasone (DECADRON) 4 MG tablet Take 2 tabs at the night before and 2 tab the morning of chemotherapy, every 3 weeks, by mouth x 6 cycles 36 tablet 6   diphenoxylate-atropine (LOMOTIL) 2.5-0.025 MG tablet Take 1 tablet  by mouth 4 (four) times daily as needed for diarrhea or loose stools. 90 tablet 0   losartan (COZAAR) 25 MG tablet Take 25 mg by mouth daily.     ondansetron (ZOFRAN) 8 MG tablet Take 1 tablet (8  mg total) by mouth every 8 (eight) hours as needed. 30 tablet 1   prochlorperazine (COMPAZINE) 10 MG tablet Take 1 tablet (10 mg total) by mouth every 6 (six) hours as needed (Nausea or vomiting). 30 tablet 1   No current facility-administered medications for this visit.    SUMMARY OF ONCOLOGIC HISTORY: Oncology History Overview Note  ER 90%, PR 95% PD-L1 score 1% MSI Stable   Endometrioid adenocarcinoma of uterus ([place])  01/05/2014 Initial Diagnosis   The patient has a history of a 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  The patient's initial surgery was with Dr. Marti Sleigh in [place] on January 05, 2014.  It included an exploratory laparotomy with TAH, BSO, appendectomy, pelvic and para-aortic lymphadenectomy and wedge resection of a concerning appearing liver lesion.  Preoperatively she presented with a 26 cm right adnexal mass and an elevated Ca1 25 at 404 units/mL.  At the completion of surgery there was no gross residual disease.  Final pathology showed a primary grade 3 endometrial adenocarcinoma (endometrioid) involving both ovaries.  Lymph nodes and other biopsies were negative.  She had uncomplicated postoperative course.  She was treated with 6 cycles of carboplatin paclitaxel chemotherapy.  She was recommended adjuvant radiation therapy however declined this after counseling.  Following her initial therapy she developed severe left lower extremity lymphedema.   Follow-up CT scan in March 2016 showed no evidence of disease.  She was followed every 3 months for a total of 5 years with no recurrence appreciated during that time   03/28/2019 Imaging   Ct abdomen and pelvis 1. Enlarged right inguinal lymph node or soft tissue mass, measuring 3.8 x 3.5 cm (series 2, image 69). Additional enlarged right iliac lymph node measuring 1.9 x 1.1 cm (series 2, image 68). Findings are concerning for malignancy.   2. Status post  hysterectomy per stated history of endometrial malignancy.   3. No other findings in the abdomen or pelvis concerning for malignancy or metastatic disease.   4. There are multiple subcentimeter low-attenuation lesions of the liver, the largest in the left lobe the measuring 7 mm, too small to characterize (series 2, image 24). Attention on follow-up although these are most likely incidental benign small cysts and/or hemangiomata.   5.  Aortic atherosclerosis.   04/10/2019 Pathology Results   A. LYMPH NODE, RIGHT INGUINAL, NEEDLE CORE BIOPSY:  - Metastatic adenocarcinoma.  - See comment.   COMMENT:   The morphology is most consistent with metastatic endometrioid adenocarcinoma.  Immunohistochemistry can be performed if clinically indicated.  There is sufficient tissue for additional testing   04/10/2019 Procedure   Technically successful ultrasound guided biopsy of dominant right inguinal lymph node   04/22/2019 Cancer Staging   Staging form: Corpus Uteri - Carcinoma and Carcinosarcoma, AJCC 8th Edition - Pathologic stage from 04/22/2019: FIGO Stage IVB (rpT3a, pN0, pM1) - Signed by [name], MD on 04/22/2019   04/22/2019 PET scan   1. Two hypermetabolic RIGHT iliac lymph nodes and larger metastatic RIGHT inguinal lymph node. 2. No evidence of metastatic disease outside the pelvis. 3. No clear evidence of activity at the vaginal cuff.   05/15/2019 Procedure   Placement of a  subcutaneous port device. Catheter tip in the lower SVC   05/16/2019 - 06/12/2019 Chemotherapy   The patient had cisplatin for chemotherapy treatment.      Genetic Testing   Patient has genetic testing done for MSI. Results revealed patient has the following: MSI Stable   06/23/2019 Imaging   1. Interval decreased size of right inguinal and external iliac lymph nodes. 2. No evidence of progressive metastatic disease. 3. Stable small low-density hepatic lesions, likely cysts. 4. Aortic Atherosclerosis  (ICD10-I70.0).     07/14/2019 - 04/06/2020 Chemotherapy   The patient had everolimus and letrozole for chemotherapy treatment.  Treatment was discontinued due to significant side effects    10/20/2019 Imaging   1. No acute abdominal/pelvic findings, mass lesions or adenopathy. 2. Interval resolution of right iliac lymph node. No residual measurable lymph node is identified. Decrease in size of the right inguinal node. 3. Stable scattered hepatic cysts.   Aortic Atherosclerosis (ICD10-I70.0).   03/08/2020 Imaging   No evidence of recurrent or metastatic carcinoma within the abdomen or pelvis.   Colonic diverticulosis. No radiographic evidence of diverticulitis.   Stable small epigastric ventral hernia containing only fat.     05/31/2020 Imaging   Status post hysterectomy and suspected bilateral salpingo-oophorectomy.   No evidence of recurrent or metastatic disease.   06/22/2020 Procedure   Successful right IJ vein Port-A-Cath explant.     03/11/2021 Imaging   1. There has been interval increase in size of a lymph node or soft tissue mass in the right inguinal station, measuring 3.0 x 2.6 cm, previously 1.9 x 1.7 cm. Interval enlargement of bilateral inguinal lymph nodes. Findings are concerning for nodal metastatic disease. 2. There is a subcutaneous soft tissue nodule of the anterior right thigh measuring 1.6 x 1.0 cm, this level of the thigh not included in the field of view prior examination. This is of uncertain significance, potentially incidental and infectious or inflammatory, however concerning for malignancy given other findings. Consider targeted ultrasound and tissue sampling. 3. Status post hysterectomy and oophorectomy. 4. Small, fat containing midline epigastric hernia.   Aortic Atherosclerosis (ICD10-I70.0).   04/06/2021 -  Chemotherapy   Patient is on Treatment Plan : UTERINE Carboplatin only       PHYSICAL EXAMINATION: ECOG PERFORMANCE STATUS: 2 - Symptomatic,  <50% confined to bed  Vitals:   04/12/21 1105  BP: 119/72  Pulse: 71  Resp: 18  Temp: 97.9 F (36.6 C)  SpO2: 97%   Filed Weights   04/12/21 1105  Weight: 164 lb 6.4 oz (74.6 kg)    GENERAL:alert, no distress and comfortable SKIN: Her leg cellulitis has healed NEURO: alert & oriented x 3 with fluent speech, no focal motor/sensory deficits  LABORATORY DATA:  I have reviewed the data as listed    Component Value Date/Time   NA 139 03/31/2021 0811   K 4.5 03/31/2021 0811   CL 106 03/31/2021 0811   CO2 23 03/31/2021 0811   GLUCOSE 83 03/31/2021 0811   BUN 11 03/31/2021 0811   CREATININE 1.04 (H) 03/31/2021 0811   CALCIUM 9.3 03/31/2021 0811   PROT 6.6 03/31/2021 0811   ALBUMIN 3.6 03/31/2021 0811   AST 13 (L) 03/31/2021 0811   ALT 12 03/31/2021 0811   ALKPHOS 89 03/31/2021 0811   BILITOT 0.3 03/31/2021 0811   GFRNONAA 58 (L) 03/31/2021 0811   GFRAA >60 04/06/2020 0827    No results found for: SPEP, UPEP  Lab Results  Component Value Date  WBC 4.5 03/31/2021   NEUTROABS 2.8 03/31/2021   HGB 13.1 03/31/2021   HCT 40.6 03/31/2021   MCV 93.3 03/31/2021   PLT 286 03/31/2021      Chemistry      Component Value Date/Time   NA 139 03/31/2021 0811   K 4.5 03/31/2021 0811   CL 106 03/31/2021 0811   CO2 23 03/31/2021 0811   BUN 11 03/31/2021 0811   CREATININE 1.04 (H) 03/31/2021 0811      Component Value Date/Time   CALCIUM 9.3 03/31/2021 0811   ALKPHOS 89 03/31/2021 0811   AST 13 (L) 03/31/2021 0811   ALT 12 03/31/2021 0811   BILITOT 0.3 03/31/2021 2297

## 2021-04-12 NOTE — Assessment & Plan Note (Signed)
She tolerated treatment very poorly with severe nausea, diarrhea, uncontrolled neuropathic pain and weakness, along with bone pain We discussed the risk and benefits of treatment approach Ultimately, she has made informed decision to discontinue Taxol and proceed with single agent carboplatin only I will make an adjustment to her treatment plan and hopefully we can move all her treatment to be given in 1 day In the meantime, we will continue supportive care I will get my nursing staff to check on her at the end of the week

## 2021-04-14 ENCOUNTER — Telehealth: Payer: Self-pay

## 2021-04-14 NOTE — Telephone Encounter (Signed)
Called to follow up with how she is doing after recent office visit. Nausea is better and she denies vomiting. She eating a bland diet with no problems. Diarrhea is the same and she will continue to take Lomotil as needed.  She is able to stay hydrated and drinking lots of fluids. States that does not need IV fluids at this time. She is taking tylenol # 3 at night for feet/ankle pain. Tylenol # 3 is helping with pain. Instructed to call the office back if needed for questions or concerns. She verbalized understanding.  FYI

## 2021-04-22 MED FILL — Dexamethasone Sodium Phosphate Inj 100 MG/10ML: INTRAMUSCULAR | Qty: 1 | Status: AC

## 2021-04-22 MED FILL — Fosaprepitant Dimeglumine For IV Infusion 150 MG (Base Eq): INTRAVENOUS | Qty: 5 | Status: AC

## 2021-04-25 ENCOUNTER — Other Ambulatory Visit: Payer: Self-pay

## 2021-04-25 ENCOUNTER — Inpatient Hospital Stay: Payer: Medicare HMO | Attending: Hematology and Oncology

## 2021-04-25 ENCOUNTER — Inpatient Hospital Stay: Payer: Medicare HMO

## 2021-04-25 ENCOUNTER — Encounter: Payer: Self-pay | Admitting: Hematology and Oncology

## 2021-04-25 ENCOUNTER — Inpatient Hospital Stay: Payer: Medicare HMO | Admitting: Hematology and Oncology

## 2021-04-25 DIAGNOSIS — Z5111 Encounter for antineoplastic chemotherapy: Secondary | ICD-10-CM | POA: Insufficient documentation

## 2021-04-25 DIAGNOSIS — T451X5A Adverse effect of antineoplastic and immunosuppressive drugs, initial encounter: Secondary | ICD-10-CM

## 2021-04-25 DIAGNOSIS — C541 Malignant neoplasm of endometrium: Secondary | ICD-10-CM | POA: Insufficient documentation

## 2021-04-25 DIAGNOSIS — C55 Malignant neoplasm of uterus, part unspecified: Secondary | ICD-10-CM

## 2021-04-25 DIAGNOSIS — G620 Drug-induced polyneuropathy: Secondary | ICD-10-CM

## 2021-04-25 DIAGNOSIS — Z7189 Other specified counseling: Secondary | ICD-10-CM

## 2021-04-25 DIAGNOSIS — G893 Neoplasm related pain (acute) (chronic): Secondary | ICD-10-CM | POA: Diagnosis not present

## 2021-04-25 LAB — CBC WITH DIFFERENTIAL/PLATELET
Abs Immature Granulocytes: 0.01 10*3/uL (ref 0.00–0.07)
Basophils Absolute: 0 10*3/uL (ref 0.0–0.1)
Basophils Relative: 0 %
Eosinophils Absolute: 0.1 10*3/uL (ref 0.0–0.5)
Eosinophils Relative: 1 %
HCT: 38.2 % (ref 36.0–46.0)
Hemoglobin: 12.7 g/dL (ref 12.0–15.0)
Immature Granulocytes: 0 %
Lymphocytes Relative: 23 %
Lymphs Abs: 1.2 10*3/uL (ref 0.7–4.0)
MCH: 30.9 pg (ref 26.0–34.0)
MCHC: 33.2 g/dL (ref 30.0–36.0)
MCV: 92.9 fL (ref 80.0–100.0)
Monocytes Absolute: 0.6 10*3/uL (ref 0.1–1.0)
Monocytes Relative: 12 %
Neutro Abs: 3.2 10*3/uL (ref 1.7–7.7)
Neutrophils Relative %: 64 %
Platelets: 203 10*3/uL (ref 150–400)
RBC: 4.11 MIL/uL (ref 3.87–5.11)
RDW: 13.9 % (ref 11.5–15.5)
WBC: 5.1 10*3/uL (ref 4.0–10.5)
nRBC: 0 % (ref 0.0–0.2)

## 2021-04-25 LAB — COMPREHENSIVE METABOLIC PANEL
ALT: 15 U/L (ref 0–44)
AST: 13 U/L — ABNORMAL LOW (ref 15–41)
Albumin: 3.5 g/dL (ref 3.5–5.0)
Alkaline Phosphatase: 96 U/L (ref 38–126)
Anion gap: 10 (ref 5–15)
BUN: 14 mg/dL (ref 8–23)
CO2: 23 mmol/L (ref 22–32)
Calcium: 9.2 mg/dL (ref 8.9–10.3)
Chloride: 106 mmol/L (ref 98–111)
Creatinine, Ser: 0.84 mg/dL (ref 0.44–1.00)
GFR, Estimated: >60 mL/min (ref >60)
Glucose, Bld: 88 mg/dL (ref 70–99)
Potassium: 3.9 mmol/L (ref 3.5–5.1)
Sodium: 139 mmol/L (ref 135–145)
Total Bilirubin: 0.3 mg/dL (ref 0.3–1.2)
Total Protein: 6.6 g/dL (ref 6.5–8.1)

## 2021-04-25 MED ORDER — SODIUM CHLORIDE 0.9 % IV SOLN
10.0000 mg | Freq: Once | INTRAVENOUS | Status: AC
  Administered 2021-04-25: 10 mg via INTRAVENOUS
  Filled 2021-04-25: qty 10

## 2021-04-25 MED ORDER — PALONOSETRON HCL INJECTION 0.25 MG/5ML
0.2500 mg | Freq: Once | INTRAVENOUS | Status: AC
  Administered 2021-04-25: 0.25 mg via INTRAVENOUS
  Filled 2021-04-25: qty 5

## 2021-04-25 MED ORDER — SODIUM CHLORIDE 0.9 % IV SOLN
430.0000 mg | Freq: Once | INTRAVENOUS | Status: AC
  Administered 2021-04-25: 430 mg via INTRAVENOUS
  Filled 2021-04-25: qty 43

## 2021-04-25 MED ORDER — SODIUM CHLORIDE 0.9 % IV SOLN: Freq: Once | INTRAVENOUS | Status: AC

## 2021-04-25 MED ORDER — SODIUM CHLORIDE 0.9 % IV SOLN
150.0000 mg | Freq: Once | INTRAVENOUS | Status: AC
  Administered 2021-04-25: 150 mg via INTRAVENOUS
  Filled 2021-04-25: qty 150

## 2021-04-25 NOTE — Assessment & Plan Note (Signed)
She had severe uncontrolled neuropathic pain due to Taxol in our last visit  As discussed above, we are in agreement to discontinue Taxol I recommend the patient to take gabapentin which seems to help

## 2021-04-25 NOTE — Assessment & Plan Note (Signed)
Her pain is well controlled She has a prescription pain medicine to take as needed

## 2021-04-25 NOTE — Assessment & Plan Note (Signed)
Overall, she tolerated treatment very well She still have persistent leg swelling but overall stable Denies peripheral neuropathy getting worse We will continue treatment as scheduled

## 2021-04-25 NOTE — Patient Instructions (Signed)
[facility]COL[facility]  Discharge Instructions: Thank you for choosing [facility] to provide your oncology and hematology care.   If you have a lab appointment with the Cancer Center, please go directly to the Cancer Center and check in at the registration area.   Wear comfortable clothing and clothing appropriate for easy access to any Portacath or PICC line.   We strive to give you quality time with your provider. You may need to reschedule your appointment if you arrive late (15 or more minutes).  Arriving late affects you and other patients whose appointments are after yours.  Also, if you miss three or more appointments without notifying the office, you may be dismissed from the clinic at the provider's discretion.      For prescription refill requests, have your pharmacy contact our office and allow 72 hours for refills to be completed.    Today you received the following chemotherapy and/or immunotherapy agents :Carboplatin   To help prevent nausea and vomiting after your treatment, we encourage you to take your nausea medication as directed.  BELOW ARE SYMPTOMS THAT SHOULD BE REPORTED IMMEDIATELY: *FEVER GREATER THAN 100.4 F (38 C) OR HIGHER *CHILLS OR SWEATING *NAUSEA AND VOMITING THAT IS NOT CONTROLLED WITH YOUR NAUSEA MEDICATION *UNUSUAL SHORTNESS OF BREATH *UNUSUAL BRUISING OR BLEEDING *URINARY PROBLEMS (pain or burning when urinating, or frequent urination) *BOWEL PROBLEMS (unusual diarrhea, constipation, pain near the anus) TENDERNESS IN MOUTH AND THROAT WITH OR WITHOUT PRESENCE OF ULCERS (sore throat, sores in mouth, or a toothache) UNUSUAL RASH, SWELLING OR PAIN  UNUSUAL VAGINAL DISCHARGE OR ITCHING   Items with * indicate a potential emergency and should be followed up as soon as possible or go to the Emergency Department if any problems should occur.  Please show the CHEMOTHERAPY ALERT CARD or IMMUNOTHERAPY ALERT CARD at check-in to  the Emergency Department and triage nurse.  Should you have questions after your visit or need to cancel or reschedule your appointment, please contact [facility]  Dept: [phone]  and follow the prompts.  Office hours are 8:00 a.m. to 4:30 p.m. Monday - Friday. Please note that voicemails left after 4:00 p.m. may not be returned until the following business day.  We are closed weekends and major holidays. You have access to a nurse at all times for urgent questions. Please call the main number to the clinic Dept: [phone] and follow the prompts.   For any non-urgent questions, you may also contact your provider using MyChart. We now offer e-Visits for anyone 18 and older to request care online for non-urgent symptoms. For details visit [email or web address].   Also download the MyChart app! Go to the app store, search "MyChart", open the app, select [facility], and log in with your MyChart username and password.  Due to Covid, a mask is required upon entering the hospital/clinic. If you do not have a mask, one will be given to you upon arrival. For doctor visits, patients may have 1 support person aged 18 or older with them. For treatment visits, patients cannot have anyone with them due to current Covid guidelines and our immunocompromised population.   

## 2021-04-25 NOTE — Progress Notes (Signed)
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[place]) Overall, she tolerated treatment very well She still have persistent leg swelling but overall stable Denies peripheral neuropathy getting worse We will continue treatment as scheduled  Peripheral neuropathy due to chemotherapy [facility]) She had severe uncontrolled neuropathic pain due to Taxol in our last visit  As discussed above, we are in agreement to discontinue Taxol I recommend the patient to take gabapentin which seems to help  Cancer associated pain Her pain is well controlled She has a prescription pain medicine to take as needed  No orders of the defined types were placed in this encounter.   All questions were answered. The patient knows to call the clinic with any problems, questions or concerns. The total time spent in the appointment was 20 minutes encounter with patients including review of chart and various tests results, discussions about plan of care and coordination of care plan   [name], MD 04/25/2021 12:15 PM  INTERVAL HISTORY: Please see below for problem oriented charting. she returns for treatment follow-up for cycle 2 of chemotherapy Since last time I saw her, she felt better Her leg swelling is better She denies recent infection  REVIEW OF SYSTEMS:   Constitutional: Denies fevers, chills or abnormal weight loss Eyes: Denies blurriness of vision Ears, nose, mouth, throat, and face: Denies mucositis or sore throat Respiratory: Denies cough, dyspnea or wheezes Cardiovascular: Denies palpitation, chest discomfort Gastrointestinal:  Denies nausea, heartburn or change in bowel habits Skin: Denies abnormal skin rashes Lymphatics: Denies new lymphadenopathy or easy bruising Neurological:Denies numbness, tingling  or new weaknesses Behavioral/Psych: Mood is stable, no new changes  All other systems were reviewed with the patient and are negative.  I have reviewed the past medical history, past surgical history, social history and family history with the patient and they are unchanged from previous note.  ALLERGIES:  is allergic to oxycodone, garlic, other, and penicillins.  MEDICATIONS:  Current Outpatient Medications  Medication Sig Dispense Refill   acetaminophen-codeine (TYLENOL #3) 300-30 MG tablet Take 1 tablet by mouth every 4 (four) hours as needed for moderate pain. 30 tablet 0   dexamethasone (DECADRON) 4 MG tablet Take 2 tabs at the night before and 2 tab the morning of chemotherapy, every 3 weeks, by mouth x 6 cycles 36 tablet 6   diphenoxylate-atropine (LOMOTIL) 2.5-0.025 MG tablet Take 1 tablet by mouth 4 (four) times daily as needed for diarrhea or loose stools. 90 tablet 0   gabapentin (NEURONTIN) 300 MG capsule Take 300 mg by mouth 2 (two) times daily.     losartan (COZAAR) 25 MG tablet Take 25 mg by mouth daily.     ondansetron (ZOFRAN) 8 MG tablet Take 1 tablet (8 mg total) by mouth every 8 (eight) hours as needed. 30 tablet 1   prochlorperazine (COMPAZINE) 10 MG tablet Take 1 tablet (10 mg total) by mouth every 6 (six) hours as needed (Nausea or vomiting). 30 tablet 1   No current facility-administered medications for this visit.    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  The patient's initial surgery was with Dr. Marti Sleigh in [place] on January 05, 2014.  It included an exploratory laparotomy with TAH, BSO, appendectomy, pelvic and para-aortic lymphadenectomy and wedge resection of a concerning  appearing liver lesion.  Preoperatively she presented with a 26 cm right adnexal mass and an elevated Ca1 25 at 404 units/mL.  At the completion of surgery there was no gross residual disease.  Final pathology showed a primary grade 3 endometrial adenocarcinoma (endometrioid) involving both ovaries.  Lymph nodes and other biopsies were negative.  She had uncomplicated postoperative course.  She was treated with 6 cycles of carboplatin paclitaxel chemotherapy.  She was recommended adjuvant radiation therapy however declined this after counseling.  Following her initial therapy she developed severe left lower extremity lymphedema.   Follow-up CT scan in March 2016 showed no evidence of disease.  She was followed every 3 months for a total of 5 years with no recurrence appreciated during that time   03/28/2019 Imaging   Ct abdomen and pelvis 1. Enlarged right inguinal lymph node or soft tissue mass, measuring 3.8 x 3.5 cm (series 2, image 69). Additional enlarged right iliac lymph node measuring 1.9 x 1.1 cm (series 2, image 68). Findings are concerning for malignancy.   2. Status post hysterectomy per stated history of endometrial malignancy.   3. No other findings in the abdomen or pelvis concerning for malignancy or metastatic disease.   4. There are multiple subcentimeter low-attenuation lesions of the liver, the largest in the left lobe the measuring 7 mm, too small to characterize (series 2, image 24). Attention on follow-up although these are most likely incidental benign small cysts and/or hemangiomata.   5.  Aortic atherosclerosis.   04/10/2019 Pathology Results   A. LYMPH NODE, RIGHT INGUINAL, NEEDLE CORE BIOPSY:  - Metastatic adenocarcinoma.  - See comment.   COMMENT:   The morphology is most consistent with metastatic endometrioid adenocarcinoma.  Immunohistochemistry can be performed if clinically indicated.  There is sufficient tissue for additional testing   04/10/2019 Procedure    Technically successful ultrasound guided biopsy of dominant right inguinal lymph node   04/22/2019 Cancer Staging   Staging form: Corpus Uteri - Carcinoma and Carcinosarcoma, AJCC 8th Edition - Pathologic stage from 04/22/2019: FIGO Stage IVB (rpT3a, pN0, pM1) - Signed by [name], MD on 04/22/2019   04/22/2019 PET scan   1. Two hypermetabolic RIGHT iliac lymph nodes and larger metastatic RIGHT inguinal lymph node. 2. No evidence of metastatic disease outside the pelvis. 3. No clear evidence of activity at the vaginal cuff.   05/15/2019 Procedure   Placement of a subcutaneous port device. Catheter tip in the lower SVC   05/16/2019 - 06/12/2019 Chemotherapy   The patient had cisplatin for chemotherapy treatment.      Genetic Testing   Patient has genetic testing done for MSI. Results revealed patient has the following: MSI Stable   06/23/2019 Imaging   1. Interval decreased size of right inguinal and external iliac lymph nodes. 2. No evidence of progressive metastatic disease. 3. Stable small low-density hepatic lesions, likely cysts. 4. Aortic Atherosclerosis (ICD10-I70.0).     07/14/2019 - 04/06/2020 Chemotherapy   The patient had everolimus and letrozole for chemotherapy treatment.  Treatment was discontinued due to significant side effects    10/20/2019 Imaging   1. No acute abdominal/pelvic findings, mass lesions or adenopathy. 2. Interval resolution of right iliac lymph node. No residual measurable lymph node is identified.  Decrease in size of the right inguinal node. 3. Stable scattered hepatic cysts.   Aortic Atherosclerosis (ICD10-I70.0).   03/08/2020 Imaging   No evidence of recurrent or metastatic carcinoma within the abdomen or pelvis.   Colonic diverticulosis. No radiographic evidence of diverticulitis.   Stable small epigastric ventral hernia containing only fat.     05/31/2020 Imaging   Status post hysterectomy and suspected bilateral  salpingo-oophorectomy.   No evidence of recurrent or metastatic disease.   06/22/2020 Procedure   Successful right IJ vein Port-A-Cath explant.     03/11/2021 Imaging   1. There has been interval increase in size of a lymph node or soft tissue mass in the right inguinal station, measuring 3.0 x 2.6 cm, previously 1.9 x 1.7 cm. Interval enlargement of bilateral inguinal lymph nodes. Findings are concerning for nodal metastatic disease. 2. There is a subcutaneous soft tissue nodule of the anterior right thigh measuring 1.6 x 1.0 cm, this level of the thigh not included in the field of view prior examination. This is of uncertain significance, potentially incidental and infectious or inflammatory, however concerning for malignancy given other findings. Consider targeted ultrasound and tissue sampling. 3. Status post hysterectomy and oophorectomy. 4. Small, fat containing midline epigastric hernia.   Aortic Atherosclerosis (ICD10-I70.0).   04/06/2021 -  Chemotherapy   Patient is on Treatment Plan : UTERINE Carboplatin only       PHYSICAL EXAMINATION: ECOG PERFORMANCE STATUS: 0 - Asymptomatic  Vitals:   04/25/21 1115  BP: 139/86  Pulse: 69  Resp: 17  Temp: 98.2 F (36.8 C)  SpO2: 100%   Filed Weights   04/25/21 1115  Weight: 165 lb 3.2 oz (74.9 kg)    GENERAL:alert, no distress and comfortable SKIN: skin color, texture, turgor are normal, no rashes or significant lesions EYES: normal, Conjunctiva are pink and non-injected, sclera clear OROPHARYNX:no exudate, no erythema and lips, buccal mucosa, and tongue normal  NECK: supple, thyroid normal size, non-tender, without nodularity LYMPH:  no palpable lymphadenopathy in the cervical, axillary or inguinal LUNGS: clear to auscultation and percussion with normal breathing effort HEART: regular rate & rhythm and no murmurs with moderate lower extremity edema ABDOMEN:abdomen soft, non-tender and normal bowel sounds Musculoskeletal:no  cyanosis of digits and no clubbing  NEURO: alert & oriented x 3 with fluent speech, no focal motor/sensory deficits  LABORATORY DATA:  I have reviewed the data as listed    Component Value Date/Time   NA 139 04/25/2021 1057   K 3.9 04/25/2021 1057   CL 106 04/25/2021 1057   CO2 23 04/25/2021 1057   GLUCOSE 88 04/25/2021 1057   BUN 14 04/25/2021 1057   CREATININE 0.84 04/25/2021 1057   CALCIUM 9.2 04/25/2021 1057   PROT 6.6 04/25/2021 1057   ALBUMIN 3.5 04/25/2021 1057   AST 13 (L) 04/25/2021 1057   ALT 15 04/25/2021 1057   ALKPHOS 96 04/25/2021 1057   BILITOT 0.3 04/25/2021 1057   GFRNONAA >60 04/25/2021 1057   GFRAA >60 04/06/2020 0827    No results found for: SPEP, UPEP  Lab Results  Component Value Date   WBC 5.1 04/25/2021   NEUTROABS 3.2 04/25/2021   HGB 12.7 04/25/2021   HCT 38.2 04/25/2021   MCV 92.9 04/25/2021   PLT 203 04/25/2021      Chemistry      Component Value Date/Time   NA 139 04/25/2021 1057   K 3.9 04/25/2021 1057   CL 106 04/25/2021 1057   CO2 23 04/25/2021 1057  BUN 14 04/25/2021 1057   CREATININE 0.84 04/25/2021 1057      Component Value Date/Time   CALCIUM 9.2 04/25/2021 1057   ALKPHOS 96 04/25/2021 1057   AST 13 (L) 04/25/2021 1057   ALT 15 04/25/2021 1057   BILITOT 0.3 04/25/2021 1057

## 2021-04-26 ENCOUNTER — Ambulatory Visit: Payer: Medicare HMO

## 2021-04-26 ENCOUNTER — Telehealth: Payer: Self-pay

## 2021-04-26 NOTE — Telephone Encounter (Signed)
Returned her call. She is complaining of pain/discomfort to left arm iv site after IV Aloxi at infusion appt yesterday with treatment. She is applying ice to site and will take motrin prn for discomfort. Denies redness. She will call back in the am to give update and send a picture on mychart of arm if needed.

## 2021-05-13 MED FILL — Dexamethasone Sodium Phosphate Inj 100 MG/10ML: INTRAMUSCULAR | Qty: 1 | Status: AC

## 2021-05-13 MED FILL — Fosaprepitant Dimeglumine For IV Infusion 150 MG (Base Eq): INTRAVENOUS | Qty: 5 | Status: AC

## 2021-05-16 ENCOUNTER — Encounter: Payer: Self-pay | Admitting: Hematology and Oncology

## 2021-05-16 ENCOUNTER — Other Ambulatory Visit: Payer: Self-pay

## 2021-05-16 ENCOUNTER — Inpatient Hospital Stay: Payer: Medicare HMO

## 2021-05-16 ENCOUNTER — Inpatient Hospital Stay: Payer: Medicare HMO | Admitting: Hematology and Oncology

## 2021-05-16 DIAGNOSIS — R197 Diarrhea, unspecified: Secondary | ICD-10-CM

## 2021-05-16 DIAGNOSIS — C55 Malignant neoplasm of uterus, part unspecified: Secondary | ICD-10-CM

## 2021-05-16 DIAGNOSIS — C774 Secondary and unspecified malignant neoplasm of inguinal and lower limb lymph nodes: Secondary | ICD-10-CM

## 2021-05-16 DIAGNOSIS — G893 Neoplasm related pain (acute) (chronic): Secondary | ICD-10-CM | POA: Diagnosis not present

## 2021-05-16 DIAGNOSIS — D61818 Other pancytopenia: Secondary | ICD-10-CM

## 2021-05-16 DIAGNOSIS — Z5111 Encounter for antineoplastic chemotherapy: Secondary | ICD-10-CM | POA: Diagnosis not present

## 2021-05-16 DIAGNOSIS — I808 Phlebitis and thrombophlebitis of other sites: Secondary | ICD-10-CM | POA: Diagnosis not present

## 2021-05-16 DIAGNOSIS — C541 Malignant neoplasm of endometrium: Secondary | ICD-10-CM | POA: Diagnosis not present

## 2021-05-16 DIAGNOSIS — G620 Drug-induced polyneuropathy: Secondary | ICD-10-CM | POA: Diagnosis not present

## 2021-05-16 DIAGNOSIS — Z7189 Other specified counseling: Secondary | ICD-10-CM

## 2021-05-16 DIAGNOSIS — T451X5A Adverse effect of antineoplastic and immunosuppressive drugs, initial encounter: Secondary | ICD-10-CM

## 2021-05-16 DIAGNOSIS — I809 Phlebitis and thrombophlebitis of unspecified site: Secondary | ICD-10-CM | POA: Insufficient documentation

## 2021-05-16 LAB — COMPREHENSIVE METABOLIC PANEL
ALT: 11 U/L (ref 0–44)
AST: 15 U/L (ref 15–41)
Albumin: 3.6 g/dL (ref 3.5–5.0)
Alkaline Phosphatase: 89 U/L (ref 38–126)
Anion gap: 8 (ref 5–15)
BUN: 9 mg/dL (ref 8–23)
CO2: 25 mmol/L (ref 22–32)
Calcium: 9 mg/dL (ref 8.9–10.3)
Chloride: 106 mmol/L (ref 98–111)
Creatinine, Ser: 0.79 mg/dL (ref 0.44–1.00)
GFR, Estimated: >60 mL/min (ref >60)
Glucose, Bld: 93 mg/dL (ref 70–99)
Potassium: 4 mmol/L (ref 3.5–5.1)
Sodium: 139 mmol/L (ref 135–145)
Total Bilirubin: 0.5 mg/dL (ref 0.3–1.2)
Total Protein: 6.6 g/dL (ref 6.5–8.1)

## 2021-05-16 LAB — CBC WITH DIFFERENTIAL/PLATELET
Abs Immature Granulocytes: 0 10*3/uL (ref 0.00–0.07)
Basophils Absolute: 0 10*3/uL (ref 0.0–0.1)
Basophils Relative: 0 %
Eosinophils Absolute: 0 10*3/uL (ref 0.0–0.5)
Eosinophils Relative: 1 %
HCT: 36.6 % (ref 36.0–46.0)
Hemoglobin: 12.7 g/dL (ref 12.0–15.0)
Immature Granulocytes: 0 %
Lymphocytes Relative: 29 %
Lymphs Abs: 0.9 10*3/uL (ref 0.7–4.0)
MCH: 32.2 pg (ref 26.0–34.0)
MCHC: 34.7 g/dL (ref 30.0–36.0)
MCV: 92.7 fL (ref 80.0–100.0)
Monocytes Absolute: 0.4 10*3/uL (ref 0.1–1.0)
Monocytes Relative: 12 %
Neutro Abs: 1.9 10*3/uL (ref 1.7–7.7)
Neutrophils Relative %: 58 %
Platelets: 142 10*3/uL — ABNORMAL LOW (ref 150–400)
RBC: 3.95 MIL/uL (ref 3.87–5.11)
RDW: 15.7 % — ABNORMAL HIGH (ref 11.5–15.5)
WBC: 3.2 10*3/uL — ABNORMAL LOW (ref 4.0–10.5)
nRBC: 0 % (ref 0.0–0.2)

## 2021-05-16 MED ORDER — SODIUM CHLORIDE 0.9 % IV SOLN
150.0000 mg | Freq: Once | INTRAVENOUS | Status: AC
  Administered 2021-05-16: 150 mg via INTRAVENOUS
  Filled 2021-05-16: qty 150

## 2021-05-16 MED ORDER — SODIUM CHLORIDE 0.9 % IV SOLN
10.0000 mg | Freq: Once | INTRAVENOUS | Status: AC
  Administered 2021-05-16: 10 mg via INTRAVENOUS
  Filled 2021-05-16: qty 10

## 2021-05-16 MED ORDER — PALONOSETRON HCL INJECTION 0.25 MG/5ML
0.2500 mg | Freq: Once | INTRAVENOUS | Status: AC
  Administered 2021-05-16: 0.25 mg via INTRAVENOUS
  Filled 2021-05-16: qty 5

## 2021-05-16 MED ORDER — SODIUM CHLORIDE 0.9 % IV SOLN
445.0000 mg | Freq: Once | INTRAVENOUS | Status: AC
  Administered 2021-05-16: 450 mg via INTRAVENOUS
  Filled 2021-05-16: qty 45

## 2021-05-16 MED ORDER — SODIUM CHLORIDE 0.9 % IV SOLN
Freq: Once | INTRAVENOUS | Status: AC
Start: 2021-05-16 — End: 2021-05-16

## 2021-05-16 NOTE — Assessment & Plan Note (Signed)
She has signs of superficial thrombophlebitis She has poor IV access We discussed the risk and benefits of port placement She will go home and think about it

## 2021-05-16 NOTE — Assessment & Plan Note (Signed)
Her left leg is still quite swollen Recommend conservative approach with leg elevation

## 2021-05-16 NOTE — Assessment & Plan Note (Signed)
Her neuropathy has improved since discontinuation of Taxol She has not been taking gabapentin and hence I discontinued the prescription

## 2021-05-16 NOTE — Progress Notes (Signed)
[facility] OFFICE PROGRESS NOTE  Patient Care Team: Shon Baton, MD as PCP - General (Internal Medicine) Awanda Mink Craige Cotta, RN as Oncology Nurse Navigator (Oncology)  ASSESSMENT & PLAN:  Endometrioid adenocarcinoma of uterus [facility]) Due to uncontrolled neuropathic pain from Taxol, that was discontinued and she is only getting single agent carboplatin She tolerated that better She still have persistent leg swelling but overall stable We will continue for minimum 4 cycles of treatment before repeating imaging study  Pancytopenia, acquired [facility]) She has mild pancytopenia from treatment but not symptomatic Observe only We will proceed with treatment without delay  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[place]) Her left leg is still quite swollen Recommend conservative approach with leg elevation  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   All questions were answered. The patient knows to call the clinic with any problems, questions or concerns. The total time spent in the appointment was 30 minutes encounter with patients including review of chart and various tests results, discussions about plan of care and coordination of care plan   [name], MD 05/16/2021 11:54 AM  INTERVAL HISTORY: Please see below for problem oriented charting. she returns for treatment follow-up on cycle #3 of carboplatin for recurrent uterine cancer Since discontinuation of  paclitaxel, her neuropathy is stable/improved She has not been taking gabapentin Her lower extremity edema is still persistent, moderate She continues to have significant diarrhea and is not consistent with taking Lomotil or Imodium She had issue with treatment last cycle; she felt that the nurse might be infusing Aloxi a bit too soon causing burning sensation in her vein She have signs of superficial thrombophlebitis in upper extremity She is somewhat hesitant about port placement She had very mild nausea from treatment Denies recent infection, fever or chills  REVIEW OF SYSTEMS:   Constitutional: Denies fevers, chills or abnormal weight loss Eyes: Denies blurriness of vision Ears, nose, mouth, throat, and face: Denies mucositis or sore throat Respiratory: Denies cough, dyspnea or wheezes Cardiovascular: Denies palpitation, chest discomfort or lower extremity swelling Gastrointestinal:  Denies nausea, heartburn or change in bowel habits Lymphatics: Denies new lymphadenopathy  Neurological:Denies numbness, tingling or new weaknesses Behavioral/Psych: Mood is stable, no new changes  All other systems were reviewed with the patient and are negative.  I have reviewed the past medical history, past surgical history, social history and family history with the patient and they are unchanged from previous note.  ALLERGIES:  is allergic to oxycodone, garlic, other, and penicillins.  MEDICATIONS:  Current Outpatient Medications  Medication Sig Dispense Refill   acetaminophen-codeine (TYLENOL #3) 300-30 MG tablet Take 1 tablet by mouth every 4 (four) hours as needed for moderate pain. 30 tablet 0   diphenoxylate-atropine (LOMOTIL) 2.5-0.025 MG tablet Take 1 tablet by mouth 4 (four) times daily as needed for diarrhea or loose stools. 90 tablet 0   losartan (COZAAR) 25 MG tablet Take 25 mg by mouth daily.     ondansetron (ZOFRAN) 8 MG tablet Take 1 tablet (8  mg total) by mouth every 8 (eight)  hours as needed. 30 tablet 1   prochlorperazine (COMPAZINE) 10 MG tablet Take 1 tablet (10 mg total) by mouth every 6 (six) hours as needed (Nausea or vomiting). 30 tablet 1   No current facility-administered medications for this visit.    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  The patient's initial surgery was with Dr. Marti Sleigh in [place] on January 05, 2014.  It included an exploratory laparotomy with TAH, BSO, appendectomy, pelvic and para-aortic lymphadenectomy and wedge resection of a concerning appearing liver lesion.  Preoperatively she presented with a 26 cm right adnexal mass and an elevated Ca1 25 at 404 units/mL.  At the completion of surgery there was no gross residual disease.  Final pathology showed a primary grade 3 endometrial adenocarcinoma (endometrioid) involving both ovaries.  Lymph nodes and other biopsies were negative.  She had uncomplicated postoperative course.  She was treated with 6 cycles of carboplatin paclitaxel chemotherapy.  She was recommended adjuvant radiation therapy however declined this after counseling.  Following her initial therapy she developed severe left lower extremity lymphedema.   Follow-up CT scan in March 2016 showed no evidence of disease.  She was followed every 3 months for a total of 5 years with no recurrence appreciated during that time   03/28/2019 Imaging   Ct abdomen and pelvis 1. Enlarged right inguinal lymph node or soft tissue mass, measuring 3.8 x 3.5 cm (series 2, image 69). Additional enlarged right iliac lymph node measuring 1.9 x 1.1 cm (series 2, image 68). Findings are concerning for malignancy.   2. Status post hysterectomy per stated history of  endometrial malignancy.   3. No other findings in the abdomen or pelvis concerning for malignancy or metastatic disease.   4. There are multiple subcentimeter low-attenuation lesions of the liver, the largest in the left lobe the measuring 7 mm, too small to characterize (series 2, image 24). Attention on follow-up although these are most likely incidental benign small cysts and/or hemangiomata.   5.  Aortic atherosclerosis.   04/10/2019 Pathology Results   A. LYMPH NODE, RIGHT INGUINAL, NEEDLE CORE BIOPSY:  - Metastatic adenocarcinoma.  - See comment.   COMMENT:   The morphology is most consistent with metastatic endometrioid adenocarcinoma.  Immunohistochemistry can be performed if clinically indicated.  There is sufficient tissue for additional testing   04/10/2019 Procedure   Technically successful ultrasound guided biopsy of dominant right inguinal lymph node   04/22/2019 Cancer Staging   Staging form: Corpus Uteri - Carcinoma and Carcinosarcoma, AJCC 8th Edition - Pathologic stage from 04/22/2019: FIGO Stage IVB (rpT3a, pN0, pM1) - Signed by [name], MD on 04/22/2019    04/22/2019 PET scan   1. Two hypermetabolic RIGHT iliac lymph nodes and larger metastatic RIGHT inguinal lymph node. 2. No evidence of metastatic disease outside the pelvis. 3. No clear evidence of activity at the vaginal cuff.   05/15/2019 Procedure   Placement of a subcutaneous port device. Catheter tip in the lower SVC   05/16/2019 - 06/12/2019 Chemotherapy   The patient had cisplatin for chemotherapy treatment.      Genetic Testing   Patient has genetic testing done for MSI. Results  revealed patient has the following: MSI Stable   06/23/2019 Imaging   1. Interval decreased size of right inguinal and external iliac lymph nodes. 2. No evidence of progressive metastatic disease. 3. Stable small low-density hepatic lesions, likely cysts. 4. Aortic Atherosclerosis (ICD10-I70.0).     07/14/2019 -  04/06/2020 Chemotherapy   The patient had everolimus and letrozole for chemotherapy treatment.  Treatment was discontinued due to significant side effects    10/20/2019 Imaging   1. No acute abdominal/pelvic findings, mass lesions or adenopathy. 2. Interval resolution of right iliac lymph node. No residual measurable lymph node is identified. Decrease in size of the right inguinal node. 3. Stable scattered hepatic cysts.   Aortic Atherosclerosis (ICD10-I70.0).   03/08/2020 Imaging   No evidence of recurrent or metastatic carcinoma within the abdomen or pelvis.   Colonic diverticulosis. No radiographic evidence of diverticulitis.   Stable small epigastric ventral hernia containing only fat.     05/31/2020 Imaging   Status post hysterectomy and suspected bilateral salpingo-oophorectomy.   No evidence of recurrent or metastatic disease.   06/22/2020 Procedure   Successful right IJ vein Port-A-Cath explant.     03/11/2021 Imaging   1. There has been interval increase in size of a lymph node or soft tissue mass in the right inguinal station, measuring 3.0 x 2.6 cm, previously 1.9 x 1.7 cm. Interval enlargement of bilateral inguinal lymph nodes. Findings are concerning for nodal metastatic disease. 2. There is a subcutaneous soft tissue nodule of the anterior right thigh measuring 1.6 x 1.0 cm, this level of the thigh not included in the field of view prior examination. This is of uncertain significance, potentially incidental and infectious or inflammatory, however concerning for malignancy given other findings. Consider targeted ultrasound and tissue sampling. 3. Status post hysterectomy and oophorectomy. 4. Small, fat containing midline epigastric hernia.   Aortic Atherosclerosis (ICD10-I70.0).   04/06/2021 -  Chemotherapy   Patient is on Treatment Plan : UTERINE Carboplatin only       PHYSICAL EXAMINATION: ECOG PERFORMANCE STATUS: 1 - Symptomatic but completely ambulatory  Vitals:    05/16/21 1102  BP: 115/72  Pulse: 79  Resp: 17  Temp: 97.9 F (36.6 C)  SpO2: 100%   Filed Weights   05/16/21 1102  Weight: 167 lb 3.2 oz (75.8 kg)    GENERAL:alert, no distress and comfortable SKIN: Noted signs of superficial thrombophlebitis of her upper extremity veins from recent chemotherapy HEART: she has persistent moderate left extremity edema NEURO: alert & oriented x 3 with fluent speech, no focal motor/sensory deficits  LABORATORY DATA:  I have reviewed the data as listed    Component Value Date/Time   NA 139 05/16/2021 1043   K 4.0 05/16/2021 1043   CL 106 05/16/2021 1043   CO2 25 05/16/2021 1043   GLUCOSE 93 05/16/2021 1043   BUN 9 05/16/2021 1043   CREATININE 0.79 05/16/2021 1043   CALCIUM 9.0 05/16/2021 1043   PROT 6.6 05/16/2021 1043   ALBUMIN 3.6 05/16/2021 1043   AST 15 05/16/2021 1043   ALT 11 05/16/2021 1043   ALKPHOS 89 05/16/2021 1043   BILITOT 0.5 05/16/2021 1043   GFRNONAA >60 05/16/2021 1043   GFRAA >60 04/06/2020 0827    No results found for: SPEP, UPEP  Lab Results  Component Value Date   WBC 3.2 (L) 05/16/2021   NEUTROABS 1.9 05/16/2021   HGB 12.7 05/16/2021   HCT 36.6 05/16/2021   MCV 92.7 05/16/2021   PLT 142 (L)  05/16/2021      Chemistry      Component Value Date/Time   NA 139 05/16/2021 1043   K 4.0 05/16/2021 1043   CL 106 05/16/2021 1043   CO2 25 05/16/2021 1043   BUN 9 05/16/2021 1043   CREATININE 0.79 05/16/2021 1043      Component Value Date/Time   CALCIUM 9.0 05/16/2021 1043   ALKPHOS 89 05/16/2021 1043   AST 15 05/16/2021 1043   ALT 11 05/16/2021 1043   BILITOT 0.5 05/16/2021 1043

## 2021-05-16 NOTE — Patient Instructions (Signed)
[facility]  Discharge Instructions: Thank you for choosing [facility] to provide your oncology and hematology care.   If you have a lab appointment with the Cancer Center, please go directly to the Cancer Center and check in at the registration area.   Wear comfortable clothing and clothing appropriate for easy access to any Portacath or PICC line.   We strive to give you quality time with your provider. You may need to reschedule your appointment if you arrive late (15 or more minutes).  Arriving late affects you and other patients whose appointments are after yours.  Also, if you miss three or more appointments without notifying the office, you may be dismissed from the clinic at the provider's discretion.      For prescription refill requests, have your pharmacy contact our office and allow 72 hours for refills to be completed.    Today you received the following chemotherapy and/or immunotherapy agents :Carboplatin   To help prevent nausea and vomiting after your treatment, we encourage you to take your nausea medication as directed.  BELOW ARE SYMPTOMS THAT SHOULD BE REPORTED IMMEDIATELY: *FEVER GREATER THAN 100.4 F (38 C) OR HIGHER *CHILLS OR SWEATING *NAUSEA AND VOMITING THAT IS NOT CONTROLLED WITH YOUR NAUSEA MEDICATION *UNUSUAL SHORTNESS OF BREATH *UNUSUAL BRUISING OR BLEEDING *URINARY PROBLEMS (pain or burning when urinating, or frequent urination) *BOWEL PROBLEMS (unusual diarrhea, constipation, pain near the anus) TENDERNESS IN MOUTH AND THROAT WITH OR WITHOUT PRESENCE OF ULCERS (sore throat, sores in mouth, or a toothache) UNUSUAL RASH, SWELLING OR PAIN  UNUSUAL VAGINAL DISCHARGE OR ITCHING   Items with * indicate a potential emergency and should be followed up as soon as possible or go to the Emergency Department if any problems should occur.  Please show the CHEMOTHERAPY ALERT CARD or IMMUNOTHERAPY ALERT CARD at check-in to  the Emergency Department and triage nurse.  Should you have questions after your visit or need to cancel or reschedule your appointment, please contact [facility]  Dept: [phone]  and follow the prompts.  Office hours are 8:00 a.m. to 4:30 p.m. Monday - Friday. Please note that voicemails left after 4:00 p.m. may not be returned until the following business day.  We are closed weekends and major holidays. You have access to a nurse at all times for urgent questions. Please call the main number to the clinic Dept: [phone] and follow the prompts.   For any non-urgent questions, you may also contact your provider using MyChart. We now offer e-Visits for anyone 18 and older to request care online for non-urgent symptoms. For details visit [email or web address].   Also download the MyChart app! Go to the app store, search "MyChart", open the app, select [facility], and log in with your MyChart username and password.  Due to Covid, a mask is required upon entering the hospital/clinic. If you do not have a mask, one will be given to you upon arrival. For doctor visits, patients may have 1 support person aged 18 or older with them. For treatment visits, patients cannot have anyone with them due to current Covid guidelines and our immunocompromised population.   

## 2021-05-16 NOTE — Assessment & Plan Note (Signed)
She has mild pancytopenia from treatment but not symptomatic Observe only We will proceed with treatment without delay

## 2021-05-16 NOTE — Assessment & Plan Note (Signed)
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

## 2021-05-16 NOTE — Assessment & Plan Note (Signed)
Due to uncontrolled neuropathic pain from Taxol, that was discontinued and she is only getting single agent carboplatin She tolerated that better She still have persistent leg swelling but overall stable We will continue for minimum 4 cycles of treatment before repeating imaging study

## 2021-05-28 ENCOUNTER — Other Ambulatory Visit: Payer: Self-pay | Admitting: Hematology and Oncology

## 2021-05-30 ENCOUNTER — Encounter: Payer: Self-pay | Admitting: Hematology and Oncology

## 2021-06-03 MED FILL — Dexamethasone Sodium Phosphate Inj 100 MG/10ML: INTRAMUSCULAR | Qty: 1 | Status: AC

## 2021-06-03 MED FILL — Fosaprepitant Dimeglumine For IV Infusion 150 MG (Base Eq): INTRAVENOUS | Qty: 5 | Status: AC

## 2021-06-06 ENCOUNTER — Inpatient Hospital Stay: Payer: Medicare HMO

## 2021-06-06 ENCOUNTER — Inpatient Hospital Stay: Payer: Medicare HMO | Admitting: Hematology and Oncology

## 2021-06-06 ENCOUNTER — Encounter: Payer: Self-pay | Admitting: Hematology and Oncology

## 2021-06-06 ENCOUNTER — Inpatient Hospital Stay: Payer: Medicare HMO | Attending: Hematology and Oncology

## 2021-06-06 ENCOUNTER — Other Ambulatory Visit: Payer: Self-pay

## 2021-06-06 ENCOUNTER — Telehealth: Payer: Self-pay

## 2021-06-06 VITALS — BP 137/77 | HR 85 | Temp 98.2°F | Resp 18 | Ht 62.0 in | Wt 170.8 lb

## 2021-06-06 DIAGNOSIS — R197 Diarrhea, unspecified: Secondary | ICD-10-CM | POA: Diagnosis not present

## 2021-06-06 DIAGNOSIS — C55 Malignant neoplasm of uterus, part unspecified: Secondary | ICD-10-CM

## 2021-06-06 DIAGNOSIS — M7989 Other specified soft tissue disorders: Secondary | ICD-10-CM | POA: Diagnosis not present

## 2021-06-06 DIAGNOSIS — C774 Secondary and unspecified malignant neoplasm of inguinal and lower limb lymph nodes: Secondary | ICD-10-CM

## 2021-06-06 DIAGNOSIS — Z7189 Other specified counseling: Secondary | ICD-10-CM

## 2021-06-06 DIAGNOSIS — R5383 Other fatigue: Secondary | ICD-10-CM | POA: Insufficient documentation

## 2021-06-06 DIAGNOSIS — I700 Atherosclerosis of aorta: Secondary | ICD-10-CM | POA: Insufficient documentation

## 2021-06-06 DIAGNOSIS — C541 Malignant neoplasm of endometrium: Secondary | ICD-10-CM | POA: Insufficient documentation

## 2021-06-06 LAB — CBC WITH DIFFERENTIAL (CANCER CENTER ONLY)
Abs Immature Granulocytes: 0.01 K/uL (ref 0.00–0.07)
Basophils Absolute: 0 K/uL (ref 0.0–0.1)
Basophils Relative: 0 %
Eosinophils Absolute: 0 K/uL (ref 0.0–0.5)
Eosinophils Relative: 0 %
HCT: 36.5 % (ref 36.0–46.0)
Hemoglobin: 12.4 g/dL (ref 12.0–15.0)
Immature Granulocytes: 0 %
Lymphocytes Relative: 31 %
Lymphs Abs: 1 K/uL (ref 0.7–4.0)
MCH: 33 pg (ref 26.0–34.0)
MCHC: 34 g/dL (ref 30.0–36.0)
MCV: 97.1 fL (ref 80.0–100.0)
Monocytes Absolute: 0.4 K/uL (ref 0.1–1.0)
Monocytes Relative: 12 %
Neutro Abs: 1.8 K/uL (ref 1.7–7.7)
Neutrophils Relative %: 57 %
Platelet Count: 129 K/uL — ABNORMAL LOW (ref 150–400)
RBC: 3.76 MIL/uL — ABNORMAL LOW (ref 3.87–5.11)
RDW: 16.8 % — ABNORMAL HIGH (ref 11.5–15.5)
WBC Count: 3.2 K/uL — ABNORMAL LOW (ref 4.0–10.5)
nRBC: 0 % (ref 0.0–0.2)

## 2021-06-06 LAB — CMP (CANCER CENTER ONLY)
ALT: 14 U/L (ref 0–44)
AST: 16 U/L (ref 15–41)
Albumin: 3.6 g/dL (ref 3.5–5.0)
Alkaline Phosphatase: 86 U/L (ref 38–126)
Anion gap: 10 (ref 5–15)
BUN: 11 mg/dL (ref 8–23)
CO2: 25 mmol/L (ref 22–32)
Calcium: 8.7 mg/dL — ABNORMAL LOW (ref 8.9–10.3)
Chloride: 103 mmol/L (ref 98–111)
Creatinine: 0.79 mg/dL (ref 0.44–1.00)
GFR, Estimated: >60 mL/min (ref >60)
Glucose, Bld: 95 mg/dL (ref 70–99)
Potassium: 3.9 mmol/L (ref 3.5–5.1)
Sodium: 138 mmol/L (ref 135–145)
Total Bilirubin: 0.3 mg/dL (ref 0.3–1.2)
Total Protein: 6.5 g/dL (ref 6.5–8.1)

## 2021-06-06 NOTE — Assessment & Plan Note (Signed)
The patient has experienced significant side effects from chemotherapy and expressed interest to stop treatment I reviewed with the patient the importance of repeat staging CT scan now prior to stopping to assess response to treatment If she have residual disease but not critical, taking a break is reasonable However, if she have significant disease progression while on treatment, then it would not be advisable for her to stop unless she is ready to accept the consequences of disease progression without treatment I have given her instructions to call and schedule CT imaging to be done sometime next week and we will see her back at the end of the week for further evaluation and assessment

## 2021-06-06 NOTE — Progress Notes (Signed)
[facility] OFFICE PROGRESS NOTE  Patient Care Team: Shon Baton, MD as PCP - General (Internal Medicine) Awanda Mink Craige Cotta, RN as Oncology Nurse Navigator (Oncology)  ASSESSMENT & PLAN:  Endometrioid adenocarcinoma of uterus [facility]) The patient has experienced significant side effects from chemotherapy and expressed interest to stop treatment I reviewed with the patient the importance of repeat staging CT scan now prior to stopping to assess response to treatment If she have residual disease but not critical, taking a break is reasonable However, if she have significant disease progression while on treatment, then it would not be advisable for her to stop unless she is ready to accept the consequences of disease progression without treatment I have given her instructions to call and schedule CT imaging to be done sometime next week and we will see her back at the end of the week for further evaluation and assessment   Diarrhea Her diarrhea has improved with medications She will continue Lomotil  Orders Placed This Encounter  Procedures   CT ABDOMEN PELVIS W CONTRAST    Standing Status:   Future    Standing Expiration Date:   06/06/2022    Order Specific Question:   If indicated for the ordered procedure, I authorize the administration of contrast media per Radiology protocol    Answer:   Yes    Order Specific Question:   Preferred imaging location?    Answer:   [facility]    Order Specific Question:   Radiology Contrast Protocol - do NOT remove file path    Answer:   [email or web address]    All questions were answered. The patient knows to call the clinic with any problems, questions or concerns. The total time spent in the appointment was 20 minutes encounter with patients including review of chart and various tests results, discussions about plan of care and coordination of care plan   [name], MD 06/06/2021 3:35  PM  INTERVAL HISTORY: Please see below for problem oriented charting. she returns for treatment follow-up on single agent carboplatin for recurrent uterine cancer She called to cancel treatment today The patient expressed concern over side effects of treatment and desire to take a treatment break Her diarrhea is stable with Lomotil She complained of excessive fatigue Her leg swellings are stable  REVIEW OF SYSTEMS:   Constitutional: Denies fevers, chills or abnormal weight loss Eyes: Denies blurriness of vision Ears, nose, mouth, throat, and face: Denies mucositis or sore throat Respiratory: Denies cough, dyspnea or wheezes Skin: Denies abnormal skin rashes Lymphatics: Denies new lymphadenopathy or easy bruising Neurological:Denies numbness, tingling or new weaknesses Behavioral/Psych: Mood is stable, no new changes  All other systems were reviewed with the patient and are negative.  I have reviewed the past medical history, past surgical history, social history and family history with the patient and they are unchanged from previous note.  ALLERGIES:  is allergic to oxycodone, garlic, other, and penicillins.  MEDICATIONS:  Current Outpatient Medications  Medication Sig Dispense Refill   acetaminophen-codeine (TYLENOL #3) 300-30 MG tablet Take 1 tablet by mouth every 4 (four) hours as needed for moderate pain. 30 tablet 0   diphenoxylate-atropine (LOMOTIL) 2.5-0.025 MG tablet TAKE ONE TABLET BY MOUTH FOUR TIMES A DAY AS NEEDED FOR DIARRHEA OR LOOSE STOOLS 90 tablet 0   losartan (COZAAR) 25 MG tablet Take 25 mg by mouth daily.     ondansetron (ZOFRAN) 8 MG tablet Take 1 tablet (8 mg total) by mouth every 8 (eight) hours  as needed. 30 tablet 1   prochlorperazine (COMPAZINE) 10 MG tablet Take 1 tablet (10 mg total) by mouth every 6 (six) hours as needed (Nausea or vomiting). 30 tablet 1   No current facility-administered medications for this visit.    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  The patient's initial surgery was with Dr. Marti Sleigh in [place] on January 05, 2014.  It included an exploratory laparotomy with TAH, BSO, appendectomy, pelvic and para-aortic lymphadenectomy and wedge resection of a concerning appearing liver lesion.  Preoperatively she presented with a 26 cm right adnexal mass and an elevated Ca1 25 at 404 units/mL.  At the completion of surgery there was no gross residual disease.  Final pathology showed a primary grade 3 endometrial adenocarcinoma (endometrioid) involving both ovaries.  Lymph nodes and other biopsies were negative.  She had uncomplicated postoperative course.  She was treated with 6 cycles of carboplatin paclitaxel chemotherapy.  She was recommended adjuvant radiation therapy however declined this after counseling.  Following her initial therapy she developed severe left lower extremity lymphedema.   Follow-up CT scan in March 2016 showed no evidence of disease.  She was followed every 3 months for a total of 5 years with no recurrence appreciated during that time   03/28/2019 Imaging   Ct abdomen and pelvis 1. Enlarged right inguinal lymph node or soft tissue mass, measuring 3.8 x 3.5 cm (series 2, image 69). Additional enlarged right iliac lymph node measuring 1.9 x 1.1 cm (series 2, image 68). Findings are concerning for malignancy.   2. Status post hysterectomy per stated history of endometrial malignancy.   3. No other findings in the abdomen or pelvis concerning for malignancy or metastatic disease.   4. There are multiple subcentimeter low-attenuation lesions of the liver, the largest in the left lobe the measuring 7 mm, too small to  characterize (series 2, image 24). Attention on follow-up although these are most likely incidental benign small cysts and/or hemangiomata.   5.  Aortic atherosclerosis.   04/10/2019 Pathology Results   A. LYMPH NODE, RIGHT INGUINAL, NEEDLE CORE BIOPSY:  - Metastatic adenocarcinoma.  - See comment.   COMMENT:   The morphology is most consistent with metastatic endometrioid adenocarcinoma.  Immunohistochemistry can be performed if clinically indicated.  There is sufficient tissue for additional testing   04/10/2019 Procedure   Technically successful ultrasound guided biopsy of dominant right inguinal lymph node   04/22/2019 Cancer Staging   Staging form: Corpus Uteri - Carcinoma and Carcinosarcoma, AJCC 8th Edition - Pathologic stage from 04/22/2019: FIGO Stage IVB (rpT3a, pN0, pM1) - Signed by [name], MD on 04/22/2019    04/22/2019 PET scan   1. Two hypermetabolic RIGHT iliac lymph nodes and larger metastatic RIGHT inguinal lymph node. 2. No evidence of metastatic disease outside the pelvis. 3. No clear evidence of activity at the vaginal cuff.   05/15/2019 Procedure   Placement of a subcutaneous port device. Catheter tip in the lower SVC   05/16/2019 - 06/12/2019 Chemotherapy   The patient had cisplatin for chemotherapy treatment.      Genetic Testing   Patient has genetic testing done for MSI. Results revealed patient has the following: MSI Stable  06/23/2019 Imaging   1. Interval decreased size of right inguinal and external iliac lymph nodes. 2. No evidence of progressive metastatic disease. 3. Stable small low-density hepatic lesions, likely cysts. 4. Aortic Atherosclerosis (ICD10-I70.0).     07/14/2019 - 04/06/2020 Chemotherapy   The patient had everolimus and letrozole for chemotherapy treatment.  Treatment was discontinued due to significant side effects    10/20/2019 Imaging   1. No acute abdominal/pelvic findings, mass lesions or adenopathy. 2. Interval  resolution of right iliac lymph node. No residual measurable lymph node is identified. Decrease in size of the right inguinal node. 3. Stable scattered hepatic cysts.   Aortic Atherosclerosis (ICD10-I70.0).   03/08/2020 Imaging   No evidence of recurrent or metastatic carcinoma within the abdomen or pelvis.   Colonic diverticulosis. No radiographic evidence of diverticulitis.   Stable small epigastric ventral hernia containing only fat.     05/31/2020 Imaging   Status post hysterectomy and suspected bilateral salpingo-oophorectomy.   No evidence of recurrent or metastatic disease.   06/22/2020 Procedure   Successful right IJ vein Port-A-Cath explant.     03/11/2021 Imaging   1. There has been interval increase in size of a lymph node or soft tissue mass in the right inguinal station, measuring 3.0 x 2.6 cm, previously 1.9 x 1.7 cm. Interval enlargement of bilateral inguinal lymph nodes. Findings are concerning for nodal metastatic disease. 2. There is a subcutaneous soft tissue nodule of the anterior right thigh measuring 1.6 x 1.0 cm, this level of the thigh not included in the field of view prior examination. This is of uncertain significance, potentially incidental and infectious or inflammatory, however concerning for malignancy given other findings. Consider targeted ultrasound and tissue sampling. 3. Status post hysterectomy and oophorectomy. 4. Small, fat containing midline epigastric hernia.   Aortic Atherosclerosis (ICD10-I70.0).   04/06/2021 -  Chemotherapy   Patient is on Treatment Plan : UTERINE Carboplatin only       PHYSICAL EXAMINATION: ECOG PERFORMANCE STATUS: 1 - Symptomatic but completely ambulatory  Vitals:   06/06/21 1146  BP: 137/77  Pulse: 85  Resp: 18  Temp: 98.2 F (36.8 C)  SpO2: 99%   Filed Weights   06/06/21 1146  Weight: 170 lb 12.8 oz (77.5 kg)    GENERAL:alert, no distress and comfortable NEURO: alert & oriented x 3 with fluent speech, no  focal motor/sensory deficits  LABORATORY DATA:  I have reviewed the data as listed    Component Value Date/Time   NA 138 06/06/2021 1132   K 3.9 06/06/2021 1132   CL 103 06/06/2021 1132   CO2 25 06/06/2021 1132   GLUCOSE 95 06/06/2021 1132   BUN 11 06/06/2021 1132   CREATININE 0.79 06/06/2021 1132   CALCIUM 8.7 (L) 06/06/2021 1132   PROT 6.5 06/06/2021 1132   ALBUMIN 3.6 06/06/2021 1132   AST 16 06/06/2021 1132   ALT 14 06/06/2021 1132   ALKPHOS 86 06/06/2021 1132   BILITOT 0.3 06/06/2021 1132   GFRNONAA >60 06/06/2021 1132   GFRAA >60 04/06/2020 0827    No results found for: SPEP, UPEP  Lab Results  Component Value Date   WBC 3.2 (L) 06/06/2021   NEUTROABS 1.8 06/06/2021   HGB 12.4 06/06/2021   HCT 36.5 06/06/2021   MCV 97.1 06/06/2021   PLT 129 (L) 06/06/2021      Chemistry      Component Value Date/Time   NA 138 06/06/2021 1132   K 3.9 06/06/2021 1132   CL  103 06/06/2021 1132   CO2 25 06/06/2021 1132   BUN 11 06/06/2021 1132   CREATININE 0.79 06/06/2021 1132      Component Value Date/Time   CALCIUM 8.7 (L) 06/06/2021 1132   ALKPHOS 86 06/06/2021 1132   AST 16 06/06/2021 1132   ALT 14 06/06/2021 1132   BILITOT 0.3 06/06/2021 1132

## 2021-06-06 NOTE — Assessment & Plan Note (Signed)
Her diarrhea has improved with medications She will continue Lomotil

## 2021-06-06 NOTE — Telephone Encounter (Signed)
Confirmed with patient that she would be able to keep her appointments today. Patient states that she will be coming in for labs and MD visit with Dr. Alvy Bimler, but will not stay for infusion appointment. MD made aware of conversation.

## 2021-06-15 ENCOUNTER — Ambulatory Visit (HOSPITAL_BASED_OUTPATIENT_CLINIC_OR_DEPARTMENT_OTHER): Payer: Medicare HMO

## 2021-06-17 ENCOUNTER — Inpatient Hospital Stay: Payer: Medicare HMO | Admitting: Hematology and Oncology

## 2021-06-22 ENCOUNTER — Telehealth: Payer: Self-pay

## 2021-06-22 NOTE — Telephone Encounter (Signed)
Called to follow up on recent canceled appts for CT and Dr. Alvy Bimler. She canceled the appts due to not feeling well. She thinks she has had the flu since last week. She has had nausea and feels like she cannot drink the contrast at this time. She is taking compazine and Zofran as needed for nausea. Encouraged to contact PCP for appt. She said she has appt with PCP next week and will call and schedule CT once she is feeling better. She will call the office back when CT scheduled.

## 2021-06-27 DIAGNOSIS — I890 Lymphedema, not elsewhere classified: Secondary | ICD-10-CM | POA: Diagnosis not present

## 2021-06-27 DIAGNOSIS — I77810 Thoracic aortic ectasia: Secondary | ICD-10-CM | POA: Diagnosis not present

## 2021-06-27 DIAGNOSIS — Z8542 Personal history of malignant neoplasm of other parts of uterus: Secondary | ICD-10-CM | POA: Diagnosis not present

## 2021-06-27 DIAGNOSIS — C55 Malignant neoplasm of uterus, part unspecified: Secondary | ICD-10-CM | POA: Diagnosis not present

## 2021-06-27 DIAGNOSIS — I6521 Occlusion and stenosis of right carotid artery: Secondary | ICD-10-CM | POA: Diagnosis not present

## 2021-06-27 DIAGNOSIS — E785 Hyperlipidemia, unspecified: Secondary | ICD-10-CM | POA: Diagnosis not present

## 2021-06-27 DIAGNOSIS — R197 Diarrhea, unspecified: Secondary | ICD-10-CM | POA: Diagnosis not present

## 2021-06-27 DIAGNOSIS — E039 Hypothyroidism, unspecified: Secondary | ICD-10-CM | POA: Diagnosis not present

## 2021-06-27 DIAGNOSIS — C774 Secondary and unspecified malignant neoplasm of inguinal and lower limb lymph nodes: Secondary | ICD-10-CM | POA: Diagnosis not present

## 2021-06-27 DIAGNOSIS — E669 Obesity, unspecified: Secondary | ICD-10-CM | POA: Diagnosis not present

## 2021-06-27 DIAGNOSIS — G620 Drug-induced polyneuropathy: Secondary | ICD-10-CM | POA: Diagnosis not present

## 2021-06-29 ENCOUNTER — Telehealth: Payer: Self-pay

## 2021-06-29 NOTE — Telephone Encounter (Signed)
Appts scheduled. Left appt dates and times. Ask her to call the office back.

## 2021-06-29 NOTE — Telephone Encounter (Signed)
She needs labs before CT I can see her 12/29 at 230 pm, 30 mins to review results

## 2021-06-29 NOTE — Telephone Encounter (Signed)
She called and left a message. She rescheduled CT scan to 12/21.  She is asking when do you want to see her and does she need labs?

## 2021-06-30 ENCOUNTER — Encounter: Payer: Self-pay | Admitting: Hematology and Oncology

## 2021-06-30 NOTE — Telephone Encounter (Signed)
Called back and reviewed upcoming appts. She verbalized understanding.

## 2021-07-06 ENCOUNTER — Other Ambulatory Visit: Payer: Self-pay

## 2021-07-06 ENCOUNTER — Ambulatory Visit (HOSPITAL_COMMUNITY)
Admission: RE | Admit: 2021-07-06 | Discharge: 2021-07-06 | Disposition: A | Discharge status: Home / Self Care | Payer: Medicare HMO | Source: Ambulatory Visit | Attending: Hematology and Oncology | Admitting: Hematology and Oncology

## 2021-07-06 ENCOUNTER — Inpatient Hospital Stay: Payer: Medicare HMO | Attending: Hematology and Oncology

## 2021-07-06 ENCOUNTER — Encounter (HOSPITAL_COMMUNITY): Payer: Self-pay

## 2021-07-06 DIAGNOSIS — K7689 Other specified diseases of liver: Secondary | ICD-10-CM | POA: Diagnosis not present

## 2021-07-06 DIAGNOSIS — K529 Noninfective gastroenteritis and colitis, unspecified: Secondary | ICD-10-CM | POA: Diagnosis not present

## 2021-07-06 DIAGNOSIS — C541 Malignant neoplasm of endometrium: Secondary | ICD-10-CM | POA: Insufficient documentation

## 2021-07-06 DIAGNOSIS — C774 Secondary and unspecified malignant neoplasm of inguinal and lower limb lymph nodes: Secondary | ICD-10-CM | POA: Insufficient documentation

## 2021-07-06 DIAGNOSIS — C55 Malignant neoplasm of uterus, part unspecified: Secondary | ICD-10-CM | POA: Insufficient documentation

## 2021-07-06 DIAGNOSIS — K5730 Diverticulosis of large intestine without perforation or abscess without bleeding: Secondary | ICD-10-CM | POA: Diagnosis not present

## 2021-07-06 DIAGNOSIS — Z7189 Other specified counseling: Secondary | ICD-10-CM

## 2021-07-06 DIAGNOSIS — K439 Ventral hernia without obstruction or gangrene: Secondary | ICD-10-CM | POA: Diagnosis not present

## 2021-07-06 LAB — CBC WITH DIFFERENTIAL (CANCER CENTER ONLY)
Abs Immature Granulocytes: 0.01 10*3/uL (ref 0.00–0.07)
Basophils Absolute: 0 10*3/uL (ref 0.0–0.1)
Basophils Relative: 0 %
Eosinophils Absolute: 0 10*3/uL (ref 0.0–0.5)
Eosinophils Relative: 1 %
HCT: 38.8 % (ref 36.0–46.0)
Hemoglobin: 12.9 g/dL (ref 12.0–15.0)
Immature Granulocytes: 0 %
Lymphocytes Relative: 23 %
Lymphs Abs: 1.2 10*3/uL (ref 0.7–4.0)
MCH: 33.3 pg (ref 26.0–34.0)
MCHC: 33.2 g/dL (ref 30.0–36.0)
MCV: 100.3 fL — ABNORMAL HIGH (ref 80.0–100.0)
Monocytes Absolute: 0.6 10*3/uL (ref 0.1–1.0)
Monocytes Relative: 11 %
Neutro Abs: 3.4 10*3/uL (ref 1.7–7.7)
Neutrophils Relative %: 65 %
Platelet Count: 219 10*3/uL (ref 150–400)
RBC: 3.87 MIL/uL (ref 3.87–5.11)
RDW: 15.9 % — ABNORMAL HIGH (ref 11.5–15.5)
WBC Count: 5.2 10*3/uL (ref 4.0–10.5)
nRBC: 0 % (ref 0.0–0.2)

## 2021-07-06 LAB — CMP (CANCER CENTER ONLY)
ALT: 10 U/L (ref 0–44)
AST: 13 U/L — ABNORMAL LOW (ref 15–41)
Albumin: 4 g/dL (ref 3.5–5.0)
Alkaline Phosphatase: 76 U/L (ref 38–126)
Anion gap: 6 (ref 5–15)
BUN: 20 mg/dL (ref 8–23)
CO2: 27 mmol/L (ref 22–32)
Calcium: 9 mg/dL (ref 8.9–10.3)
Chloride: 104 mmol/L (ref 98–111)
Creatinine: 0.97 mg/dL (ref 0.44–1.00)
GFR, Estimated: >60 mL/min (ref >60)
Glucose, Bld: 94 mg/dL (ref 70–99)
Potassium: 4 mmol/L (ref 3.5–5.1)
Sodium: 137 mmol/L (ref 135–145)
Total Bilirubin: 0.4 mg/dL (ref 0.3–1.2)
Total Protein: 6.7 g/dL (ref 6.5–8.1)

## 2021-07-06 MED ORDER — SODIUM CHLORIDE (PF) 0.9 % IJ SOLN
INTRAMUSCULAR | Status: AC
  Filled 2021-07-06: qty 50

## 2021-07-06 MED ORDER — IOHEXOL 350 MG/ML SOLN
80.0000 mL | Freq: Once | INTRAVENOUS | Status: AC | PRN
  Administered 2021-07-06: 15:00:00 80 mL via INTRAVENOUS

## 2021-07-06 MED ORDER — IOHEXOL 9 MG/ML PO SOLN
500.0000 mL | ORAL | Status: AC
  Administered 2021-07-06: 13:00:00 1000 mL via ORAL

## 2021-07-06 MED ORDER — IOHEXOL 9 MG/ML PO SOLN
ORAL | Status: AC
  Filled 2021-07-06: qty 1000

## 2021-07-14 ENCOUNTER — Encounter: Payer: Self-pay | Admitting: Hematology and Oncology

## 2021-07-14 ENCOUNTER — Inpatient Hospital Stay: Payer: Medicare HMO | Admitting: Hematology and Oncology

## 2021-07-14 ENCOUNTER — Other Ambulatory Visit: Payer: Self-pay

## 2021-07-14 DIAGNOSIS — C55 Malignant neoplasm of uterus, part unspecified: Secondary | ICD-10-CM | POA: Diagnosis not present

## 2021-07-14 DIAGNOSIS — C541 Malignant neoplasm of endometrium: Secondary | ICD-10-CM | POA: Diagnosis not present

## 2021-07-14 DIAGNOSIS — C774 Secondary and unspecified malignant neoplasm of inguinal and lower limb lymph nodes: Secondary | ICD-10-CM | POA: Diagnosis not present

## 2021-07-14 DIAGNOSIS — R197 Diarrhea, unspecified: Secondary | ICD-10-CM

## 2021-07-14 DIAGNOSIS — K529 Noninfective gastroenteritis and colitis, unspecified: Secondary | ICD-10-CM | POA: Diagnosis not present

## 2021-07-14 NOTE — Progress Notes (Signed)
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[facility]) I have reviewed multiple CT imaging with the patient She has excellent response to therapy but has persistent residual disease We discussed the risk and benefits of continuing with single agent carboplatin, switching treatment to pembrolizumab/Lenvima versus close observation with surveillance imaging studies She is somewhat symptomatic with discomfort in the right inguinal region with persistent chronic bilateral lower extremity edema that is somewhat stable She is aware, given her history of recurrent cancer, it is not likely she will have a long disease-free interval without additional treatment The patient is undecided She would like to discuss this with her best friend who is currently away but will be returning around January 9 She will call me after final decision is made I encouraged her to call me within the next 2 weeks if possible  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[place]) She has signs of residual disease noted on imaging study I recommend her to proceed with additional treatment  Diarrhea Her chronic diarrhea is stable She will continue Imodium and Lomotil as needed  No orders of the defined types were placed in this encounter.   All questions were answered. The patient knows to call the clinic with any problems, questions or concerns. The total time spent in the appointment was 30 minutes encounter with patients including review of chart and various tests results, discussions about plan of care and coordination of care plan   [name], MD 07/14/2021 6:44 PM  INTERVAL HISTORY: Please see below for problem oriented charting. she returns for follow-up to review recent CT imaging The patient was diagnosed with  recurrent metastatic uterine cancer status post chemotherapy She is feeling better since discontinuation of treatment Her chronic diarrhea is stable She continues to have discomfort over the right inguinal region and chronic stable bilateral lower extremity edema  REVIEW OF SYSTEMS:   Constitutional: Denies fevers, chills or abnormal weight loss Eyes: Denies blurriness of vision Ears, nose, mouth, throat, and face: Denies mucositis or sore throat Respiratory: Denies cough, dyspnea or wheezes Cardiovascular: Denies palpitation, chest discomfort  Skin: Denies abnormal skin rashes Lymphatics: Denies new lymphadenopathy or easy bruising Neurological:Denies numbness, tingling or new weaknesses Behavioral/Psych: Mood is stable, no new changes  All other systems were reviewed with the patient and are negative.  I have reviewed the past medical history, past surgical history, social history and family history with the patient and they are unchanged from previous note.  ALLERGIES:  is allergic to oxycodone, garlic, other, and penicillins.  MEDICATIONS:  Current Outpatient Medications  Medication Sig Dispense Refill   acetaminophen-codeine (TYLENOL #3) 300-30 MG tablet Take 1 tablet by mouth every 4 (four) hours as needed for moderate pain. 30 tablet 0   diphenoxylate-atropine (LOMOTIL) 2.5-0.025 MG tablet TAKE ONE TABLET BY MOUTH FOUR TIMES A DAY AS NEEDED FOR DIARRHEA OR LOOSE STOOLS 90 tablet 0   losartan (COZAAR) 25 MG tablet Take 25 mg by mouth daily.     ondansetron (ZOFRAN) 8 MG tablet Take 1 tablet (8 mg total) by mouth every 8 (eight) hours as needed. 30 tablet 1   prochlorperazine (COMPAZINE) 10 MG tablet Take 1 tablet (10 mg total) by mouth every 6 (six) hours as needed (Nausea or vomiting). 30 tablet 1   No current facility-administered medications for this  visit.    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  The patient's initial surgery was with Dr. Marti Sleigh in [place] on January 05, 2014.  It included an exploratory laparotomy with TAH, BSO, appendectomy, pelvic and para-aortic lymphadenectomy and wedge resection of a concerning appearing liver lesion.  Preoperatively she presented with a 26 cm right adnexal mass and an elevated Ca1 25 at 404 units/mL.  At the completion of surgery there was no gross residual disease.  Final pathology showed a primary grade 3 endometrial adenocarcinoma (endometrioid) involving both ovaries.  Lymph nodes and other biopsies were negative.  She had uncomplicated postoperative course.  She was treated with 6 cycles of carboplatin paclitaxel chemotherapy.  She was recommended adjuvant radiation therapy however declined this after counseling.  Following her initial therapy she developed severe left lower extremity lymphedema.   Follow-up CT scan in March 2016 showed no evidence of disease.  She was followed every 3 months for a total of 5 years with no recurrence appreciated during that time   03/28/2019 Imaging   Ct abdomen and pelvis 1. Enlarged right inguinal lymph node or soft tissue mass, measuring 3.8 x 3.5 cm (series 2, image 69). Additional enlarged right iliac lymph node measuring 1.9 x 1.1 cm (series 2, image 68). Findings are concerning for malignancy.   2. Status post hysterectomy per stated history of endometrial malignancy.   3. No other findings in the abdomen or pelvis concerning for malignancy or metastatic disease.   4. There are multiple subcentimeter low-attenuation lesions of the liver, the largest in the left lobe the measuring 7 mm, too small to characterize (series 2, image 24). Attention on follow-up although these are most likely  incidental benign small cysts and/or hemangiomata.   5.  Aortic atherosclerosis.   04/10/2019 Pathology Results   A. LYMPH NODE, RIGHT INGUINAL, NEEDLE CORE BIOPSY:  - Metastatic adenocarcinoma.  - See comment.   COMMENT:   The morphology is most consistent with metastatic endometrioid adenocarcinoma.  Immunohistochemistry can be performed if clinically indicated.  There is sufficient tissue for additional testing   04/10/2019 Procedure   Technically successful ultrasound guided biopsy of dominant right inguinal lymph node   04/22/2019 Cancer Staging   Staging form: Corpus Uteri - Carcinoma and Carcinosarcoma, AJCC 8th Edition - Pathologic stage from 04/22/2019: FIGO Stage IVB (rpT3a, pN0, pM1) - Signed by [name], MD on 04/22/2019    04/22/2019 PET scan   1. Two hypermetabolic RIGHT iliac lymph nodes and larger metastatic RIGHT inguinal lymph node. 2. No evidence of metastatic disease outside the pelvis. 3. No clear evidence of activity at the vaginal cuff.   05/15/2019 Procedure   Placement of a subcutaneous port device. Catheter tip in the lower SVC   05/16/2019 - 06/12/2019 Chemotherapy   The patient had cisplatin for chemotherapy treatment.      Genetic Testing   Patient has genetic testing done for MSI. Results revealed patient has the following: MSI Stable   06/23/2019 Imaging   1. Interval decreased size of right inguinal and external iliac lymph nodes. 2. No evidence of progressive metastatic disease. 3. Stable small low-density hepatic lesions, likely cysts. 4. Aortic Atherosclerosis (ICD10-I70.0).  07/14/2019 - 04/06/2020 Chemotherapy   The patient had everolimus and letrozole for chemotherapy treatment.  Treatment was discontinued due to significant side effects    10/20/2019 Imaging   1. No acute abdominal/pelvic findings, mass lesions or adenopathy. 2. Interval resolution of right iliac lymph node. No residual measurable lymph node is identified.  Decrease in size of the right inguinal node. 3. Stable scattered hepatic cysts.   Aortic Atherosclerosis (ICD10-I70.0).   03/08/2020 Imaging   No evidence of recurrent or metastatic carcinoma within the abdomen or pelvis.   Colonic diverticulosis. No radiographic evidence of diverticulitis.   Stable small epigastric ventral hernia containing only fat.     05/31/2020 Imaging   Status post hysterectomy and suspected bilateral salpingo-oophorectomy.   No evidence of recurrent or metastatic disease.   06/22/2020 Procedure   Successful right IJ vein Port-A-Cath explant.     03/11/2021 Imaging   1. There has been interval increase in size of a lymph node or soft tissue mass in the right inguinal station, measuring 3.0 x 2.6 cm, previously 1.9 x 1.7 cm. Interval enlargement of bilateral inguinal lymph nodes. Findings are concerning for nodal metastatic disease. 2. There is a subcutaneous soft tissue nodule of the anterior right thigh measuring 1.6 x 1.0 cm, this level of the thigh not included in the field of view prior examination. This is of uncertain significance, potentially incidental and infectious or inflammatory, however concerning for malignancy given other findings. Consider targeted ultrasound and tissue sampling. 3. Status post hysterectomy and oophorectomy. 4. Small, fat containing midline epigastric hernia.   Aortic Atherosclerosis (ICD10-I70.0).   04/06/2021 -  Chemotherapy   Patient is on Treatment Plan : UTERINE Carboplatin only     07/12/2021 Imaging   Mild decrease in size of bilateral inguinal lymphadenopathy since prior exam.   No new or progressive disease within the abdomen or pelvis.   Colonic diverticulosis. No radiographic evidence of diverticulitis.   Aortic Atherosclerosis (ICD10-I70.0).     PHYSICAL EXAMINATION: ECOG PERFORMANCE STATUS: 1 - Symptomatic but completely ambulatory  Vitals:   07/14/21 1455  BP: 124/79  Pulse: 78  Resp: 18  Temp: 98.1  F (36.7 C)  SpO2: 99%   Filed Weights   07/14/21 1455  Weight: 170 lb 6.4 oz (77.3 kg)    GENERAL:alert, no distress and comfortable NEURO: alert & oriented x 3 with fluent speech, no focal motor/sensory deficits  LABORATORY DATA:  I have reviewed the data as listed    Component Value Date/Time   NA 137 07/06/2021 1320   K 4.0 07/06/2021 1320   CL 104 07/06/2021 1320   CO2 27 07/06/2021 1320   GLUCOSE 94 07/06/2021 1320   BUN 20 07/06/2021 1320   CREATININE 0.97 07/06/2021 1320   CALCIUM 9.0 07/06/2021 1320   PROT 6.7 07/06/2021 1320   ALBUMIN 4.0 07/06/2021 1320   AST 13 (L) 07/06/2021 1320   ALT 10 07/06/2021 1320   ALKPHOS 76 07/06/2021 1320   BILITOT 0.4 07/06/2021 1320   GFRNONAA >60 07/06/2021 1320   GFRAA >60 04/06/2020 0827    No results found for: SPEP, UPEP  Lab Results  Component Value Date   WBC 5.2 07/06/2021   NEUTROABS 3.4 07/06/2021   HGB 12.9 07/06/2021   HCT 38.8 07/06/2021   MCV 100.3 (H) 07/06/2021   PLT 219 07/06/2021      Chemistry      Component Value Date/Time   NA 137 07/06/2021 1320   K 4.0 07/06/2021 1320  CL 104 07/06/2021 1320   CO2 27 07/06/2021 1320   BUN 20 07/06/2021 1320   CREATININE 0.97 07/06/2021 1320      Component Value Date/Time   CALCIUM 9.0 07/06/2021 1320   ALKPHOS 76 07/06/2021 1320   AST 13 (L) 07/06/2021 1320   ALT 10 07/06/2021 1320   BILITOT 0.4 07/06/2021 1320       RADIOGRAPHIC STUDIES: I have reviewed multiple CT imaging with the patient I have personally reviewed the radiological images as listed and agreed with the findings in the report. CT ABDOMEN PELVIS W CONTRAST  Result Date: 07/07/2021 CLINICAL DATA:  Follow-up endometrial carcinoma. Undergoing chemotherapy. Previous radiation therapy. EXAM: CT ABDOMEN AND PELVIS WITH CONTRAST TECHNIQUE: Multidetector CT imaging of the abdomen and pelvis was performed using the standard protocol following bolus administration of intravenous contrast.  CONTRAST:  70m OMNIPAQUE IOHEXOL 350 MG/ML SOLN COMPARISON:  03/10/2021 FINDINGS: Lower Chest: No acute findings. Hepatobiliary: No hepatic masses identified. Several small hepatic cysts remain stable. Gallbladder is unremarkable. No evidence of biliary ductal dilatation. Pancreas:  No mass or inflammatory changes. Spleen: Within normal limits in size and appearance. Adrenals/Urinary Tract: No masses identified. No evidence of ureteral calculi or hydronephrosis. Stomach/Bowel: No evidence of obstruction, inflammatory process or abnormal fluid collections. Diverticulosis is seen mainly involving the descending and sigmoid colon, however there is no evidence of diverticulitis. Vascular/Lymphatic: Lymphadenopathy in the right inguinal region measures 2.4 x 2.1 cm, slightly smaller in size compared to 3.0 x 2.6 cm previously. An 8 mm left inguinal node is seen, decreased in size from 13 mm previously. No new or increased sites of lymphadenopathy. Aortic atherosclerotic calcification noted. No acute vascular findings. Reproductive: Prior hysterectomy again noted. No pelvic soft tissue masses identified. Other: Stable small epigastric ventral hernia, which contains only omental fat. Musculoskeletal: No suspicious bone lesions identified. Right hip prosthesis again noted. IMPRESSION: Mild decrease in size of bilateral inguinal lymphadenopathy since prior exam. No new or progressive disease within the abdomen or pelvis. Colonic diverticulosis. No radiographic evidence of diverticulitis. Aortic Atherosclerosis (ICD10-I70.0). Electronically Signed   By: JMarlaine HindM.D.   On: 07/07/2021 11:08

## 2021-07-14 NOTE — Assessment & Plan Note (Signed)
I have reviewed multiple CT imaging with the patient She has excellent response to therapy but has persistent residual disease We discussed the risk and benefits of continuing with single agent carboplatin, switching treatment to pembrolizumab/Lenvima versus close observation with surveillance imaging studies She is somewhat symptomatic with discomfort in the right inguinal region with persistent chronic bilateral lower extremity edema that is somewhat stable She is aware, given her history of recurrent cancer, it is not likely she will have a long disease-free interval without additional treatment The patient is undecided She would like to discuss this with her best friend who is currently away but will be returning around January 9 She will call me after final decision is made I encouraged her to call me within the next 2 weeks if possible

## 2021-07-14 NOTE — Assessment & Plan Note (Signed)
Her chronic diarrhea is stable She will continue Imodium and Lomotil as needed

## 2021-07-14 NOTE — Assessment & Plan Note (Signed)
She has signs of residual disease noted on imaging study I recommend her to proceed with additional treatment

## 2021-07-22 IMAGING — CT CT ABD-PELV W/ CM
2 of 5 series · 16 of 46 positions shown, 18 images · IV contrast (ISOVUE 300)
Comparison: 03/08/2020

CLINICAL DATA: Follow-up endometrial cancer

EXAM:
CT ABDOMEN AND PELVIS WITH CONTRAST
TECHNIQUE: Multidetector CT imaging of the abdomen and pelvis was performed
using the standard protocol following bolus administration of
intravenous contrast.
CONTRAST:  100mL OMNIPAQUE IOHEXOL 300 MG/ML  SOLN

[Series 2: axial st · axial · 0.85mm/px · z∈[-618,-248]mm · 13 of 88 slices shown, 15 images]
[im 7/88  soft-tissue]
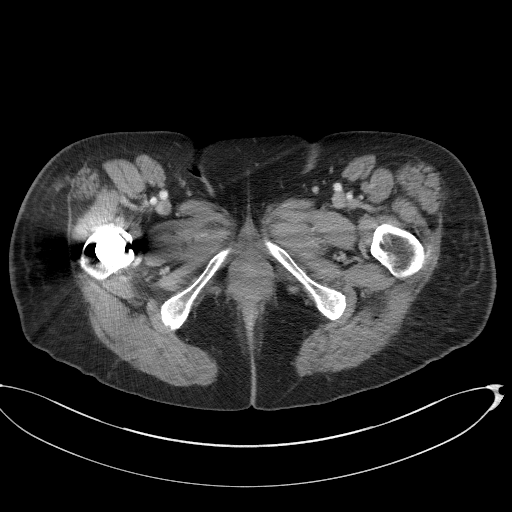
[im 7/88  bone]
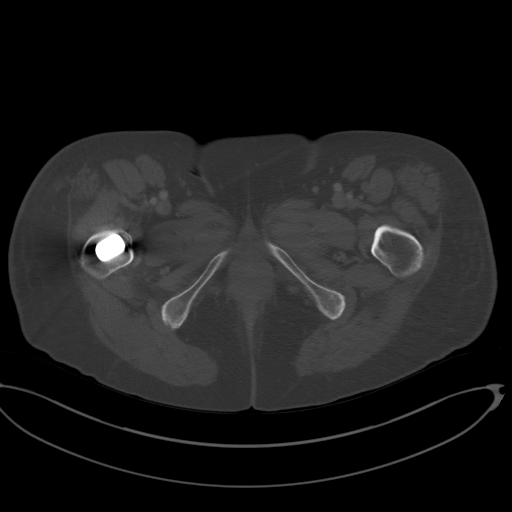
[im 13/88  soft-tissue]
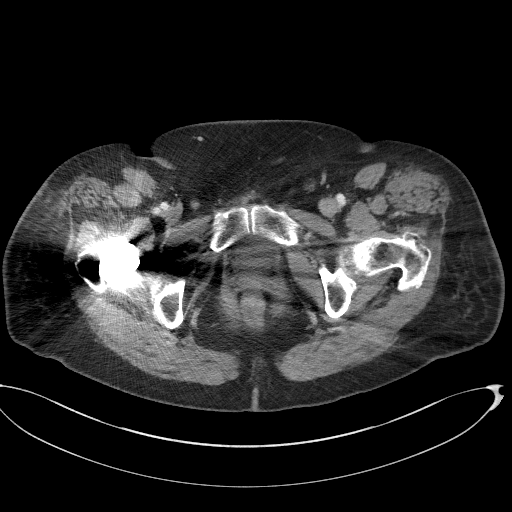
[im 19/88  soft-tissue]
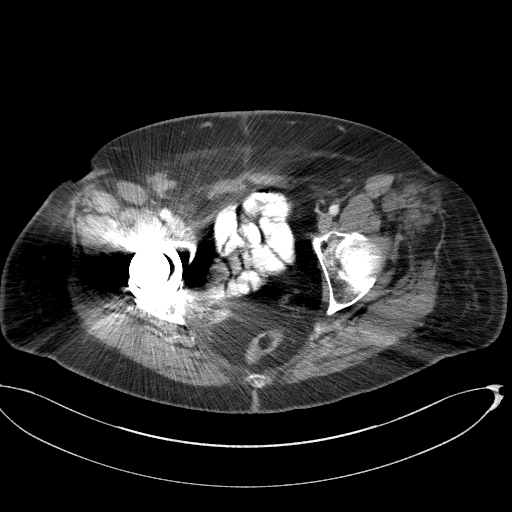
[im 25/88  soft-tissue]
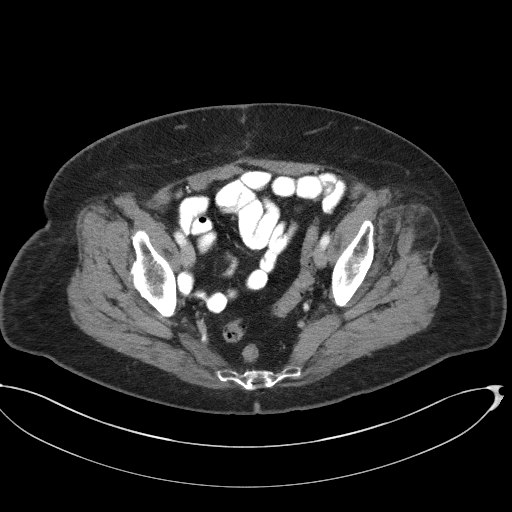
[im 32/88  soft-tissue]
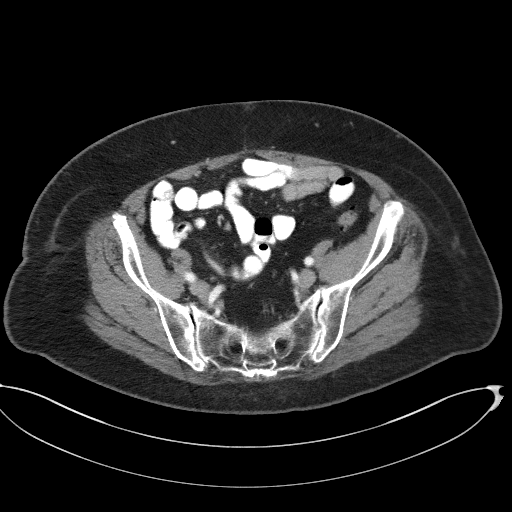
[im 38/88  soft-tissue]
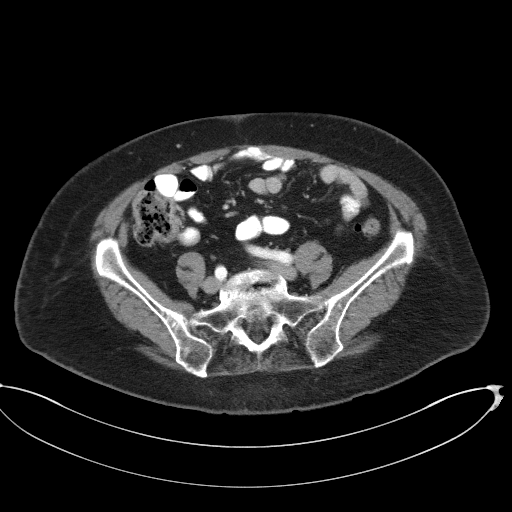
[im 44/88  soft-tissue]
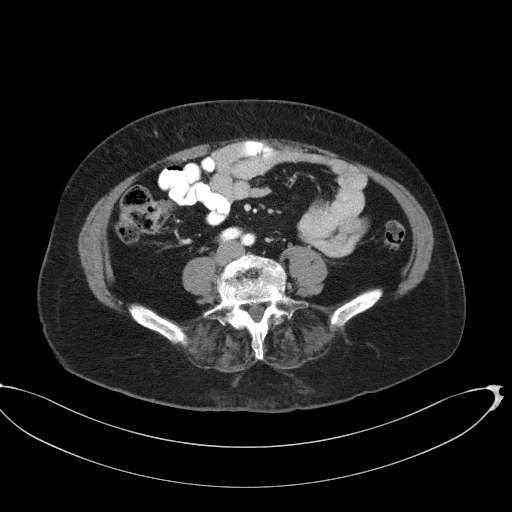
[im 50/88  soft-tissue]
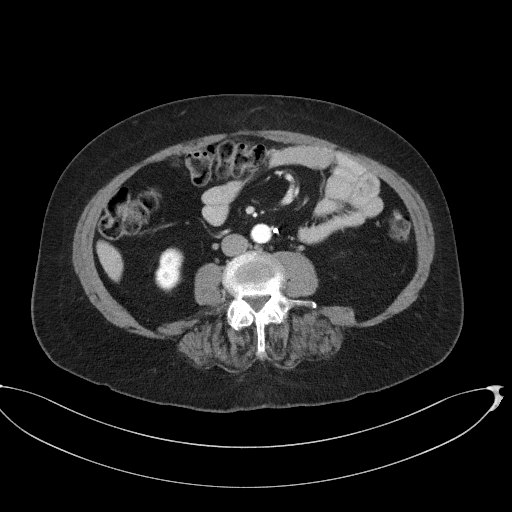
[im 56/88  soft-tissue]
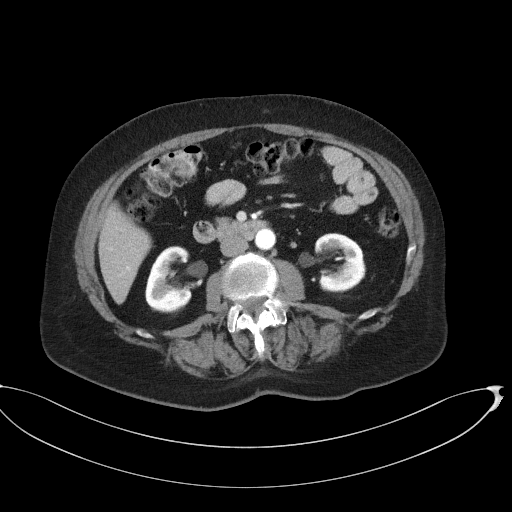
[im 56/88  bone]
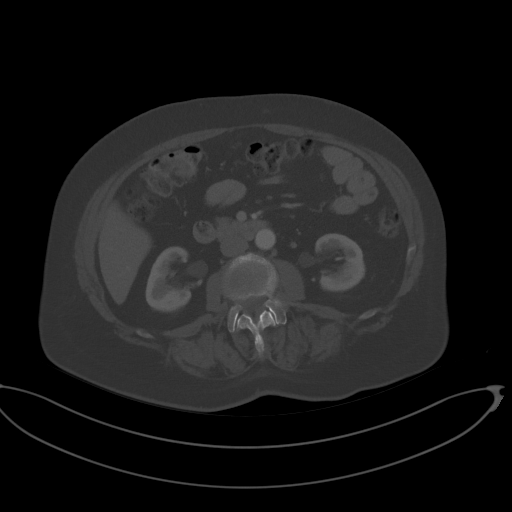
[im 63/88  soft-tissue]
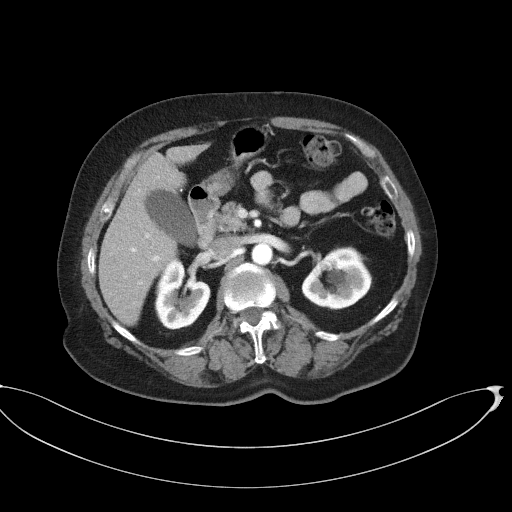
[im 69/88  soft-tissue]
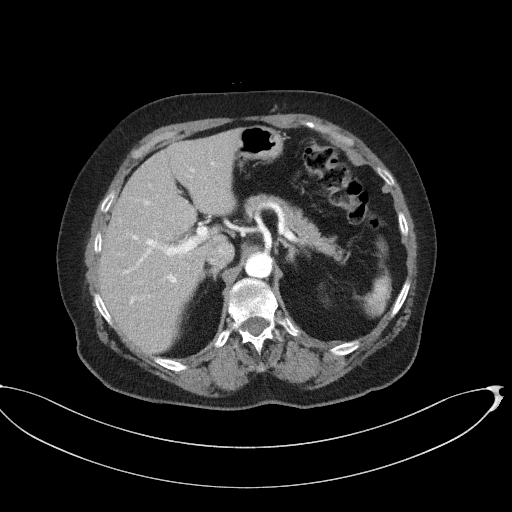
[im 75/88  soft-tissue]
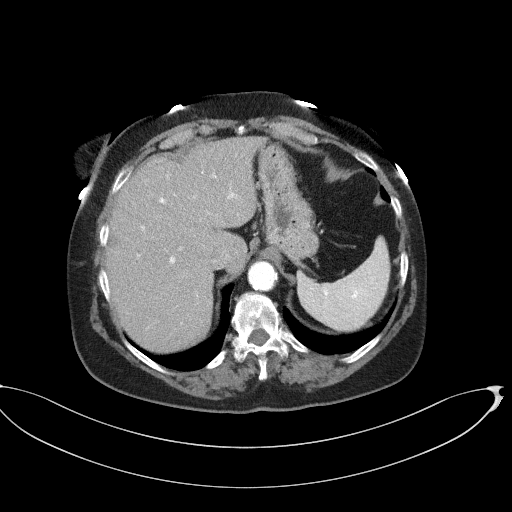
[im 81/88  soft-tissue]
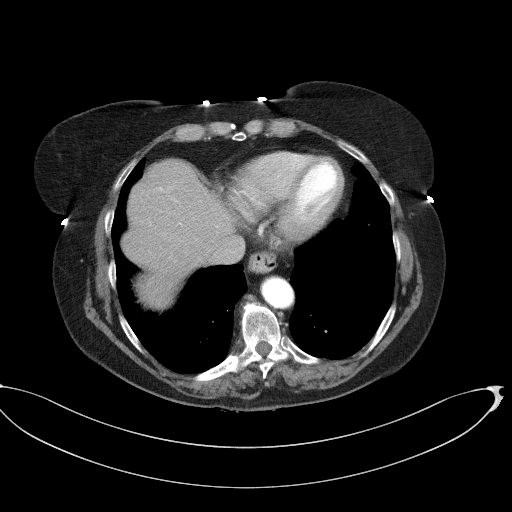

[Series 5: coronal st · coronal · 0.68mm/px · 3 of 101 slices shown]
[im 34/101  soft-tissue]
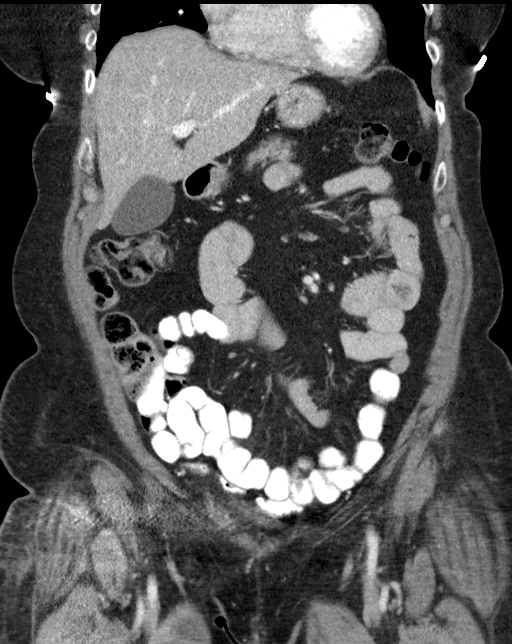
[im 45/101  soft-tissue]
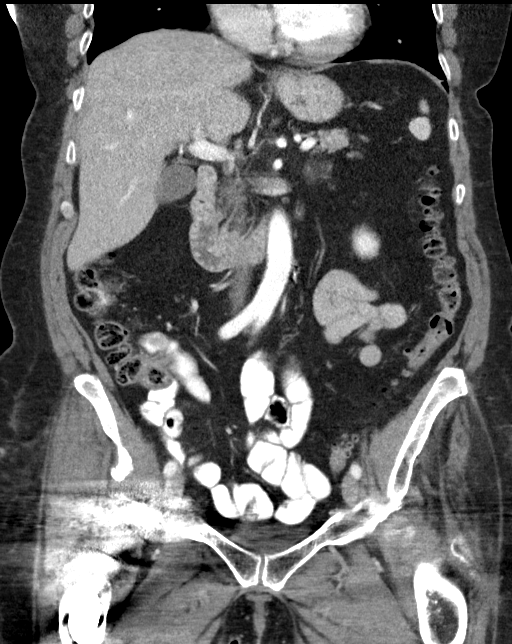
[im 56/101  soft-tissue]
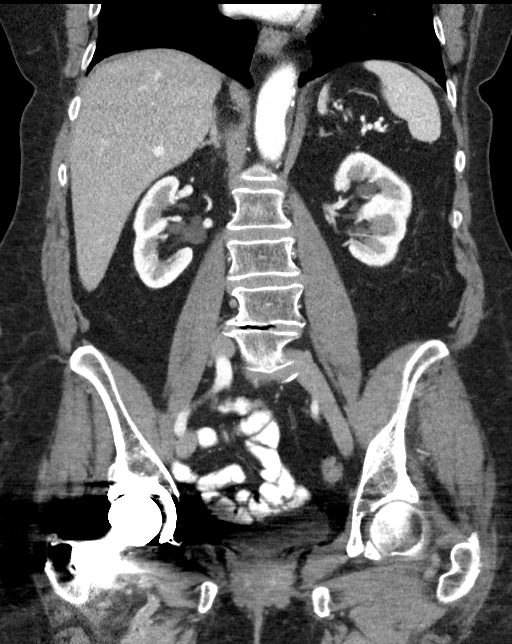

[16 of 46 positions shown; findings below may reference images not displayed]

FINDINGS: Lower chest: Lung bases are clear.

Hepatobiliary: Two small hepatic cysts measuring up to 10 mm,
unchanged.

Gallbladder is unremarkable. No intrahepatic or extrahepatic ductal
dilatation.

Pancreas: Within normal limits.

Spleen: Within normal limits.

Adrenals/Urinary Tract: Adrenal glands are within normal limits.

Left renal sinus cysts. Right kidney is within normal limits. No
hydronephrosis.

Bladder is partially obscured by streak artifact but grossly
unremarkable.

Stomach/Bowel: Stomach is notable for a small hiatal hernia.

No evidence of bowel obstruction.

Prior appendectomy.

Mild sigmoid diverticulosis, without evidence of diverticulitis.

Vascular/Lymphatic: No evidence of abdominal aortic aneurysm.

Atherosclerotic calcifications of the abdominal aorta and branch
vessels.

No suspicious abdominopelvic lymphadenopathy.

Reproductive: Status post hysterectomy. Suspected bilateral
salpingo-oophorectomy.

No adnexal masses.

Other: No abdominopelvic ascites.

No peritoneal disease or frank mental caking.

Musculoskeletal: Mild degenerative changes of the visualized
thoracolumbar spine.

Right hip arthroplasty, without evidence of complication.

No focal osseous lesions.
IMPRESSION: Status post hysterectomy and suspected bilateral
salpingo-oophorectomy.

No evidence of recurrent or metastatic disease.

## 2021-07-25 ENCOUNTER — Telehealth: Payer: Self-pay

## 2021-07-25 NOTE — Telephone Encounter (Signed)
Called pt per MD request. Pt states she has not made a decision yet, but will meet with her friend this week who just returned from [place] then let us know her decision.

## 2021-07-25 NOTE — Telephone Encounter (Signed)
-----   Message from [name], MD sent at 07/25/2021  9:14 AM EST ----- Regarding: decision Can you call her and ask about her decision on treatment?

## 2021-08-01 ENCOUNTER — Telehealth: Payer: Self-pay

## 2021-08-01 NOTE — Telephone Encounter (Addendum)
Returned her call. She has spoken with several people/ family. She has decided to get a 2nd opinion with Dr. Benay Spice at [facility]. She has decided that [facility] is too far to go. She called Dr. Gearldine Shown office and they told her to contact Dr. Alvy Bimler to help with the appt with Dr. Benay Spice.

## 2021-08-01 NOTE — Telephone Encounter (Signed)
Santiago Glad, please send information to Dr. Gearldine Shown office and the navigator

## 2021-08-02 ENCOUNTER — Encounter: Payer: Self-pay | Admitting: *Deleted

## 2021-08-02 ENCOUNTER — Encounter: Payer: Self-pay | Admitting: Hematology and Oncology

## 2021-08-02 ENCOUNTER — Encounter: Payer: Self-pay | Admitting: Oncology

## 2021-08-02 DIAGNOSIS — C55 Malignant neoplasm of uterus, part unspecified: Secondary | ICD-10-CM

## 2021-08-02 NOTE — Progress Notes (Signed)
New Patient appt tomorrow 08/03/21 at 84 with Dr Benay Spice for 2nd opinion. Called patient and confirmed

## 2021-08-03 ENCOUNTER — Inpatient Hospital Stay: Payer: Medicare HMO | Attending: Hematology and Oncology | Admitting: Oncology

## 2021-08-03 ENCOUNTER — Other Ambulatory Visit: Payer: Self-pay

## 2021-08-03 VITALS — BP 148/81 | HR 66 | Temp 97.8°F | Resp 18 | Ht 62.0 in | Wt 173.8 lb

## 2021-08-03 DIAGNOSIS — C55 Malignant neoplasm of uterus, part unspecified: Secondary | ICD-10-CM | POA: Diagnosis not present

## 2021-08-03 DIAGNOSIS — C541 Malignant neoplasm of endometrium: Secondary | ICD-10-CM | POA: Insufficient documentation

## 2021-08-03 DIAGNOSIS — I10 Essential (primary) hypertension: Secondary | ICD-10-CM | POA: Diagnosis not present

## 2021-08-03 DIAGNOSIS — Z170 Estrogen receptor positive status [ER+]: Secondary | ICD-10-CM | POA: Diagnosis not present

## 2021-08-03 DIAGNOSIS — Z79811 Long term (current) use of aromatase inhibitors: Secondary | ICD-10-CM | POA: Insufficient documentation

## 2021-08-03 NOTE — Progress Notes (Addendum)
[facility] OFFICE PROGRESS NOTE   Diagnosis: Endometrial cancer  INTERVAL HISTORY:   Dana [name] was diagnosed with endometrial cancer in 2015.  She underwent a TAH/BSO after presenting with a large right adnexal mass and elevated CA125.  She underwent gross resection of disease with the final pathology confirming a grade 3 endometrial adenocarcinoma involving both ovaries.  She completed 6 cycles of adjuvant Taxol/carboplatin chemotherapy. She was diagnosed with recurrent disease in September 2020 involving a right inguinal lymph nodes.  Biopsy confirmed metastatic adenocarcinoma, ER 90%, PR 95%.  A PD-L1 combined positive score returned at 1.  She has been treated with multiple systemic therapies the past few years, most the single agent carboplatin last given on 05/16/2021. A restaging CT on 07/06/2021 revealed decrease in right and left inguinal nodes.  No new lymphadenopathy.  No disease progression. She discussed the restaging evaluation and treatment options with Dr. Alvy Bimler.  They discussed continuing carboplatin and changing to pembrolizumab/lenvatinib. Dana [name] a second medical oncology opinion.  Past medical history: 1.  Endometrioid adenocarcinoma of the endometrium 2015 2.  Hypertension 3.  G0P0 4.  Gastroesophageal reflux disease  Surgical history: TAH/BSO 2.    Right hip preplacement  Family history: Mother had multiple myeloma  Social history: She lives alone in [place].  She is retired from a Ambulance person job.  She does not use cigarettes or alcohol.  No transfusion history.  Review of systems: Right anterior thigh nodule for the past 6 months, gastroesophageal reflux disease at night, diarrhea following pelvic radiation relieved with Lomotil, left leg cellulitis in 2022, swelling in the left leg for years A complete review of systems was otherwise negative Objective:  Vital signs in last 24 hours:  Blood pressure (!) 148/81, pulse 66,  temperature 97.8 F (36.6 C), temperature source Oral, resp. rate 18, height 5' 2"  (1.575 m), weight 173 lb 12.8 oz (78.8 kg), SpO2 100 %.    Lymphatics: No cervical, supraclavicular, or axillary nodes.  2 cm right inguinal node, no left inguinal node Resp: Lungs clear bilaterally Cardio: Regular rate and rhythm GI: No apparent ascites, no mass, no hepatosplenomegaly Vascular: Edema throughout the left leg Neuro: Alert and oriented, the motor exam appears intact in the upper and lower extremities bilaterally Skin: No rash, 2 cm cutaneous nodule at the right anterior thigh  Portacath/PICC-without erythema  Lab Results:  Lab Results  Component Value Date   WBC 5.2 07/06/2021   HGB 12.9 07/06/2021   HCT 38.8 07/06/2021   MCV 100.3 (H) 07/06/2021   PLT 219 07/06/2021   NEUTROABS 3.4 07/06/2021    CMP  Lab Results  Component Value Date   NA 137 07/06/2021   K 4.0 07/06/2021   CL 104 07/06/2021   CO2 27 07/06/2021   GLUCOSE 94 07/06/2021   BUN 20 07/06/2021   CREATININE 0.97 07/06/2021   CALCIUM 9.0 07/06/2021   PROT 6.7 07/06/2021   ALBUMIN 4.0 07/06/2021   AST 13 (L) 07/06/2021   ALT 10 07/06/2021   ALKPHOS 76 07/06/2021   BILITOT 0.4 07/06/2021   GFRNONAA >60 07/06/2021   GFRAA >60 04/06/2020      Imaging: As per HPI Medications: I have reviewed the patient's current medications.   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, status post a TAH, BSO, appendectomy, pelvic and periaortic lymphadenectomy, and wedge resection of a liver lesion Gross complete resection, tumor involve both ovaries, lymph nodes negative 6 cycles of adjuvant  Taxol/carboplatin CT abdomen/pelvis 03/28/2019-enlarged right inguinal and iliac nodes 04/10/2019-right inguinal lymph node biopsy-metastatic adenocarcinoma, ER 90%, PR 95%, PD-L1 combined positive score-1, MSS Cisplatin 05/16/2019 20-1 cycle CT 12/72/020-decrease size of right inguinal  and external iliac lymph nodes, no evidence of disease progression Everolimus and letrozole 07/14/2019 - 04/06/2020 CT 10/20/2019-resolution of right iliac lymph node, decrease in size of right inguinal node CT 03/08/2020-no evidence of recurrent or metastatic disease in the abdomen or pelvis CT 05/31/2020-no evidence of recurrent or metastatic disease CT 03/11/2021-increase right inguinal lymph node, enlargement of bilateral inguinal lymph nodes, subcutaneous soft tissue nodule at the anterior right thigh Carboplatin 04/06/2021, 04/25/2021, 05/16/2021 CT 07/12/2021-mild decrease in size of bilateral inguinal lymphadenopathy, no progressive disease in the abdomen or pelvis  2.  Left lower extremity lymphedema  3.  Chronic diarrhea controlled with Lomotil   Dana [name] has metastatic endometrial cancer.  Her diagnosis dates to June 2015.  She is currently maintained off of systemic therapy after last being treated with carboplatin 05/16/2021.  She has a palpable right inguinal lymph node and right anterior thigh nodule.  She has chronic edema of the left lower extremity.  She is otherwise asymptomatic from the endometrial cancer.  I discussed treatment options with Ms. Dargis.  We discussed continued observation, resumption of carboplatin, and switching to a different systemic therapy regimen.  Discussed everolimus/letrozole and single agent letrozole.  The tumor is ER and PR positive.  She was previously treated with letrozole.  She does not appear to have developed progressive disease while on letrozole.  She indicated quality of life is important for her.  I recommend resuming letrozole.  There are multiple systemic treatment options at the time of disease progression including cisplatin or carboplatin based therapy, everolimus/letrozole, other single chemotherapy agents, and immunotherapy.  I told her I agree with the excellent care she has received from Dr. Alvy Bimler.  Recommendations: Check  CA125 Submitted right inguinal lymph node biopsy tissue for foundation 1 testing Consider resuming treatment with letrozole or letrozole/everolimus 4.   Return to the care of Dr. Alvy Bimler.  I am available to see her as needed.  Total of 80 minutes were spent with the patient today including chart review, patient interview/exam, and documentation time.  Greater than 50% of the time was used for counseling and coordination of care.     Betsy Coder, MD  08/03/2021  10:57 AM

## 2021-08-04 ENCOUNTER — Other Ambulatory Visit: Payer: Self-pay | Admitting: *Deleted

## 2021-08-04 ENCOUNTER — Encounter: Payer: Self-pay | Admitting: *Deleted

## 2021-08-04 ENCOUNTER — Telehealth: Payer: Self-pay | Admitting: Oncology

## 2021-08-04 ENCOUNTER — Encounter: Payer: Self-pay | Admitting: Hematology and Oncology

## 2021-08-04 ENCOUNTER — Other Ambulatory Visit: Payer: Self-pay | Admitting: Hematology and Oncology

## 2021-08-04 NOTE — Telephone Encounter (Signed)
Scheduled appt per 1/19 sch msg - patient is aware of appt date and time

## 2021-08-04 NOTE — Telephone Encounter (Signed)
Called [facility] and advised her that Dr. Alvy Bimler is recommending that she follow up with Dr. Benay Spice for his recommendations for further testing and treatment.  She verbalized understanding and agreement and said she has been talking with Abigail Butts, Engineer, site at [facility] to proceed with testing.

## 2021-08-05 ENCOUNTER — Other Ambulatory Visit: Payer: Self-pay | Admitting: Nurse Practitioner

## 2021-08-05 ENCOUNTER — Encounter: Payer: Self-pay | Admitting: *Deleted

## 2021-08-05 DIAGNOSIS — C55 Malignant neoplasm of uterus, part unspecified: Secondary | ICD-10-CM

## 2021-08-05 MED ORDER — DIPHENOXYLATE-ATROPINE 2.5-0.025 MG PO TABS: 1.0000 | ORAL_TABLET | Freq: Four times a day (QID) | ORAL | 0 refills | Status: DC | PRN

## 2021-08-05 MED ORDER — LETROZOLE 2.5 MG PO TABS: 2.5000 mg | ORAL_TABLET | Freq: Every day | ORAL | 6 refills | Status: DC

## 2021-08-05 NOTE — Progress Notes (Signed)
PATIENT NAVIGATOR PROGRESS NOTE  Name: Dana [name] Date: 08/05/2021 MRN: [id number]  DOB: [birth year]   Reason for visit:  F/U after visit with Dr Benay Spice  Comments:  Spoke with [name] via phone after visit with Dr Benay Spice Renewed prescription for Lomotil Letrozole 2.5mg  once a day RTC visit with Dr Benay Spice in 1 month Foundation one testing sent from pathology accession number [phone]. Given contact information to call with any questions or issues    Time spent counseling/coordinating care: 45-60 minutes

## 2021-08-11 DIAGNOSIS — C774 Secondary and unspecified malignant neoplasm of inguinal and lower limb lymph nodes: Secondary | ICD-10-CM | POA: Diagnosis not present

## 2021-08-11 DIAGNOSIS — C55 Malignant neoplasm of uterus, part unspecified: Secondary | ICD-10-CM | POA: Diagnosis not present

## 2021-08-15 ENCOUNTER — Encounter (HOSPITAL_COMMUNITY): Payer: Self-pay

## 2021-08-30 ENCOUNTER — Other Ambulatory Visit: Payer: Self-pay | Admitting: *Deleted

## 2021-08-30 ENCOUNTER — Inpatient Hospital Stay: Payer: Medicare HMO | Attending: Hematology and Oncology | Admitting: Oncology

## 2021-08-30 ENCOUNTER — Encounter: Payer: Self-pay | Admitting: Licensed Clinical Social Worker

## 2021-08-30 ENCOUNTER — Other Ambulatory Visit: Payer: Self-pay

## 2021-08-30 ENCOUNTER — Inpatient Hospital Stay: Payer: Medicare HMO

## 2021-08-30 VITALS — BP 135/83 | HR 79 | Temp 98.7°F | Resp 19 | Ht 62.0 in | Wt 172.4 lb

## 2021-08-30 DIAGNOSIS — I890 Lymphedema, not elsewhere classified: Secondary | ICD-10-CM | POA: Insufficient documentation

## 2021-08-30 DIAGNOSIS — Z79811 Long term (current) use of aromatase inhibitors: Secondary | ICD-10-CM | POA: Insufficient documentation

## 2021-08-30 DIAGNOSIS — C541 Malignant neoplasm of endometrium: Secondary | ICD-10-CM | POA: Insufficient documentation

## 2021-08-30 DIAGNOSIS — C55 Malignant neoplasm of uterus, part unspecified: Secondary | ICD-10-CM

## 2021-08-30 DIAGNOSIS — K529 Noninfective gastroenteritis and colitis, unspecified: Secondary | ICD-10-CM | POA: Diagnosis not present

## 2021-08-30 DIAGNOSIS — C775 Secondary and unspecified malignant neoplasm of intrapelvic lymph nodes: Secondary | ICD-10-CM | POA: Diagnosis not present

## 2021-08-30 DIAGNOSIS — R42 Dizziness and giddiness: Secondary | ICD-10-CM | POA: Insufficient documentation

## 2021-08-30 NOTE — Progress Notes (Signed)
[place] Work  Clinical Social Work was referred by [facility] for assessment of psychosocial needs.  Clinical Social Worker contacted patient by phone  to offer support and assess for needs.  CSW left voicemail with contact information and request for a return call.      Adelene Amas, [place]       First Attempt

## 2021-08-30 NOTE — Progress Notes (Signed)
Referral placed for Social work

## 2021-08-30 NOTE — Progress Notes (Signed)
[facility] OFFICE PROGRESS NOTE   Diagnosis: Uterine cancer  INTERVAL HISTORY:   Ms. Latulippe returns for scheduled visit.  She began letrozole 08/06/2021.  No hot flashes or arthralgias.  She has noted less soreness at the right groin lymph node.  The lesion at the right anterior thigh is unchanged.  She has positional vertigo for the past several months.  No other neurologic symptoms.  She reports an episode of diarrhea last week.  Objective:  Vital signs in last 24 hours:  Blood pressure 135/83, pulse 79, temperature 98.7 F (37.1 C), temperature source Oral, resp. rate 19, height $RemoveBe'5\' 2"'rzEaAZDBx$  (1.575 m), weight 172 lb 6.4 oz (78.2 kg), SpO2 100 %.   Lymphatics: 2 cm right inguinal node Resp: Lungs clear bilaterally Cardio: Regular rate and rhythm GI: No hepatosplenomegaly Vascular: Edema throughout the left leg  Skin: 1.5-2 cm cutaneous nodule at the right anterior thigh Neurologic: The extraocular movements are intact, slight droop of the left eyelid  Lab Results:  Lab Results  Component Value Date   WBC 5.2 07/06/2021   HGB 12.9 07/06/2021   HCT 38.8 07/06/2021   MCV 100.3 (H) 07/06/2021   PLT 219 07/06/2021   NEUTROABS 3.4 07/06/2021    CMP  Lab Results  Component Value Date   NA 137 07/06/2021   K 4.0 07/06/2021   CL 104 07/06/2021   CO2 27 07/06/2021   GLUCOSE 94 07/06/2021   BUN 20 07/06/2021   CREATININE 0.97 07/06/2021   CALCIUM 9.0 07/06/2021   PROT 6.7 07/06/2021   ALBUMIN 4.0 07/06/2021   AST 13 (L) 07/06/2021   ALT 10 07/06/2021   ALKPHOS 76 07/06/2021   BILITOT 0.4 07/06/2021   GFRNONAA >60 07/06/2021   GFRAA >60 04/06/2020   Medications: I have reviewed the patient's current medications.   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, status post a TAH, BSO, appendectomy, pelvic and periaortic lymphadenectomy, and wedge resection of a liver lesion Gross complete resection, tumor involve  both ovaries, lymph nodes negative 6 cycles of adjuvant Taxol/carboplatin CT abdomen/pelvis 03/28/2019-enlarged right inguinal and iliac nodes 04/10/2019-right inguinal lymph node biopsy-metastatic adenocarcinoma, ER 90%, PR 95%, PD-L1 combined positive score-1, MSS, tumor mutation burden-4 Cisplatin 05/16/2019 20-1 cycle CT 12/72/020-decrease size of right inguinal and external iliac lymph nodes, no evidence of disease progression Everolimus and letrozole 07/14/2019 - 04/06/2020 CT 10/20/2019-resolution of right iliac lymph node, decrease in size of right inguinal node CT 03/08/2020-no evidence of recurrent or metastatic disease in the abdomen or pelvis CT 05/31/2020-no evidence of recurrent or metastatic disease CT 03/11/2021-increase right inguinal lymph node, enlargement of bilateral inguinal lymph nodes, subcutaneous soft tissue nodule at the anterior right thigh Carboplatin 04/06/2021, 04/25/2021, 05/16/2021 CT 07/12/2021-mild decrease in size of bilateral inguinal lymphadenopathy, no progressive disease in the abdomen or pelvis Letrozole 08/06/2021  2.  Left lower extremity lymphedema  3.  Chronic diarrhea controlled with Lomotil     Disposition: Ms. Romano appears stable.  She is tolerating the letrozole well.  We will check a baseline Ca1 25 today.  She will return for an office visit in approximately 6 weeks.  She reports positional vertigo.  This is likely benign finding.  She will follow-up with Dr. Virgina Jock if this persist.  Betsy Coder, MD  08/30/2021  10:55 AM

## 2021-08-31 LAB — CA 125: Cancer Antigen (CA) 125: 7.1 U/mL (ref 0.0–38.1)

## 2021-09-06 ENCOUNTER — Encounter: Payer: Self-pay | Admitting: Licensed Clinical Social Worker

## 2021-09-06 NOTE — Progress Notes (Signed)
[facility] Work  Clinical Social Work was referred by [facility] for assessment of psychosocial needs.  Clinical Social Worker contacted patient by phone  to offer support and assess for needs.  CSW left voicemail with contact information and request for return call.   Dana [name], [place]       Second Attempt

## 2021-09-13 ENCOUNTER — Encounter: Payer: Self-pay | Admitting: Licensed Clinical Social Worker

## 2021-09-13 NOTE — Progress Notes (Signed)
[facility] Work  Clinical Social Work was referred by Art therapist for assessment of psychosocial needs.  Clinical Social Worker contacted patient by phone  to offer support and assess for needs.  CSW left voicemail with contact information and request for return call.   Adelene Amas, [place]       Third Attempt

## 2021-10-11 ENCOUNTER — Inpatient Hospital Stay: Payer: Medicare HMO | Admitting: Oncology

## 2021-10-11 ENCOUNTER — Other Ambulatory Visit: Payer: Self-pay | Admitting: Nurse Practitioner

## 2021-10-11 ENCOUNTER — Other Ambulatory Visit: Payer: Self-pay

## 2021-10-11 ENCOUNTER — Inpatient Hospital Stay: Payer: Medicare HMO | Attending: Hematology and Oncology

## 2021-10-11 VITALS — BP 133/86 | HR 75 | Temp 97.9°F | Resp 20 | Ht 62.0 in | Wt 171.8 lb

## 2021-10-11 DIAGNOSIS — C541 Malignant neoplasm of endometrium: Secondary | ICD-10-CM | POA: Diagnosis not present

## 2021-10-11 DIAGNOSIS — C55 Malignant neoplasm of uterus, part unspecified: Secondary | ICD-10-CM

## 2021-10-11 DIAGNOSIS — K529 Noninfective gastroenteritis and colitis, unspecified: Secondary | ICD-10-CM | POA: Insufficient documentation

## 2021-10-11 DIAGNOSIS — C774 Secondary and unspecified malignant neoplasm of inguinal and lower limb lymph nodes: Secondary | ICD-10-CM | POA: Insufficient documentation

## 2021-10-11 DIAGNOSIS — Z79811 Long term (current) use of aromatase inhibitors: Secondary | ICD-10-CM | POA: Insufficient documentation

## 2021-10-11 MED ORDER — DIPHENOXYLATE-ATROPINE 2.5-0.025 MG PO TABS: 1.0000 | ORAL_TABLET | Freq: Four times a day (QID) | ORAL | 1 refills | Status: DC | PRN

## 2021-10-11 NOTE — Progress Notes (Signed)
?[facility] ?OFFICE PROGRESS NOTE ? ? ?Diagnosis: Uterine cancer ? ?INTERVAL HISTORY:  ? ?Dana [name] returns as scheduled.  She continues letrozole.  No hot flashes or arthralgias.  She feels the right groin and thigh masses are smaller.  Stable left leg edema.  She has chronic diarrhea.  She takes Lomotil twice daily.  She had transient discomfort in the left flank area last week.  None today. ? ?Objective: ? ?Vital signs in last 24 hours: ? ?Blood pressure 133/86, pulse 75, temperature 97.9 ?F (36.6 ?C), temperature source Oral, resp. rate 20, height 5' 2"  (1.575 m), weight 171 lb 12.8 oz (77.9 kg), SpO2 98 %. ?  ? ?Lymphatics: Firmness in the lateral right inguinal region without a discrete lymph node, no left inguinal node ?Resp: Lungs clear bilaterally ?Cardio: Regular rate and rhythm ?GI: No hepatosplenomegaly ?Vascular: Edema throughout the left leg  ?Skin: 1.5-2 cm cutaneous nodular lesion at the right upper anterior thigh ? ? ?Lab Results: ? ?Lab Results  ?Component Value Date  ? WBC 5.2 07/06/2021  ? HGB 12.9 07/06/2021  ? HCT 38.8 07/06/2021  ? MCV 100.3 (H) 07/06/2021  ? PLT 219 07/06/2021  ? NEUTROABS 3.4 07/06/2021  ? ? ?CMP  ?Lab Results  ?Component Value Date  ? NA 137 07/06/2021  ? K 4.0 07/06/2021  ? CL 104 07/06/2021  ? CO2 27 07/06/2021  ? GLUCOSE 94 07/06/2021  ? BUN 20 07/06/2021  ? CREATININE 0.97 07/06/2021  ? CALCIUM 9.0 07/06/2021  ? PROT 6.7 07/06/2021  ? ALBUMIN 4.0 07/06/2021  ? AST 13 (L) 07/06/2021  ? ALT 10 07/06/2021  ? ALKPHOS 76 07/06/2021  ? BILITOT 0.4 07/06/2021  ? GFRNONAA >60 07/06/2021  ? GFRAA >60 04/06/2020  ? ? ?No results found for: CEA1, CEA, [id number], CA125 ? ?Lab Results  ?Component Value Date  ? INR 0.9 05/15/2019  ? LABPROT 11.6 05/15/2019  ? ? ?Imaging: ? ?No results found. ? ?Medications: I have reviewed the patient's current medications. ? ? ?Assessment/Plan: ?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, status post a TAH,  BSO, appendectomy, pelvic and periaortic lymphadenectomy, and wedge resection of a liver lesion ?Gross complete resection, tumor involve both ovaries, lymph nodes negative ?6 cycles of adjuvant Taxol/carboplatin ?CT abdomen/pelvis 03/28/2019-enlarged right inguinal and iliac nodes ?04/10/2019-right inguinal lymph node biopsy-metastatic adenocarcinoma, ER 90%, PR 95%, PD-L1 combined positive score-1, MSS, tumor mutation burden-4 ?Cisplatin 05/16/2019 20-1 cycle ?CT 12/72/020-decrease size of right inguinal and external iliac lymph nodes, no evidence of disease progression ?Everolimus and letrozole 07/14/2019 - 04/06/2020 ?CT 10/20/2019-resolution of right iliac lymph node, decrease in size of right inguinal node ?CT 03/08/2020-no evidence of recurrent or metastatic disease in the abdomen or pelvis ?CT 05/31/2020-no evidence of recurrent or metastatic disease ?CT 03/11/2021-increase right inguinal lymph node, enlargement of bilateral inguinal lymph nodes, subcutaneous soft tissue nodule at the anterior right thigh ?Carboplatin 04/06/2021, 04/25/2021, 05/16/2021 ?CT 07/12/2021-mild decrease in size of bilateral inguinal lymphadenopathy, no progressive disease in the abdomen or pelvis ?Letrozole 08/06/2021 ? ?2.  Left lower extremity lymphedema ? ?3.  Chronic diarrhea controlled with Lomotil ? ? ? ? ? ?Disposition: ?Dana [name] is tolerating the letrozole well.  The right inguinal and thigh masses appear slightly smaller.  No clinical evidence of disease progression.  She will continue letrozole. ?She reports chronic diarrhea following treatment for uterine cancer.  She will continue Lomotil.  She requested diet recommendations.  We will make referral to the cancer center  nutritionist. ? ?She will return for an office visit in 6 weeks. ? ?Betsy Coder, MD ? ?10/11/2021  ?9:17 AM ? ? ?

## 2021-10-11 NOTE — Addendum Note (Signed)
Addended by: Lenox Ponds E on: 10/11/2021 10:39 AM ? ? Modules accepted: Orders ? ?

## 2021-10-12 ENCOUNTER — Telehealth: Payer: Self-pay | Admitting: Nutrition

## 2021-10-12 ENCOUNTER — Inpatient Hospital Stay: Payer: Medicare HMO | Admitting: Nutrition

## 2021-10-12 LAB — CA 125: Cancer Antigen (CA) 125: 7.2 U/mL (ref 0.0–38.1)

## 2021-10-12 NOTE — Telephone Encounter (Signed)
71 year old female diagnosed with Uterine Cancer S/P chemoradiation. She is taking Letrozole and has a history of GERD and Hypothyroidism.  She reports weight is stable at 171 pounds. She complains of chronic diarrhea and possible food sensitivities.  ?Reports poor tolerance to salads, popcorn and nuts which she used to enjoy. She tolerates protein foods such as salmon, chicken and beef. She also does well with pasta, potatoes, and rice. Reports several loose stools daily and is taking lomotil ? ?Nutrition Diagnosis: ?Food and Nutrition Related Knowledge Deficit related to uterine cancer and associated treatments as evidenced by chronic diarrhea. ? ?Intervention: ?Educated on low fiber diet. Encouraged patient to keep a food journal and document all foods eaten and any adverse reaction to them. Avoid other foods which she suspects may be problematic for now. When ready to re-introduce foods, only try one new food every 48-72 hours. Provided written fact sheet via e-mail. Recommended patient try Enterade BID and take as directed. Questions answered and teach back used. Contact information given. ? ?Monitoring, Evaluation, Goals: ?Patient will tolerate low fiber diet. Gradually increase diet as tolerated. ? ?Next Visit: Patient to contact RD with questions. ?

## 2021-10-12 NOTE — Progress Notes (Signed)
See telephone note.

## 2021-11-23 ENCOUNTER — Encounter: Payer: Self-pay | Admitting: Oncology

## 2021-11-23 ENCOUNTER — Inpatient Hospital Stay: Payer: Medicare HMO | Attending: Hematology and Oncology | Admitting: Oncology

## 2021-11-23 VITALS — BP 126/79 | HR 68 | Temp 98.2°F | Resp 18 | Ht 62.0 in | Wt 169.8 lb

## 2021-11-23 DIAGNOSIS — R197 Diarrhea, unspecified: Secondary | ICD-10-CM | POA: Diagnosis not present

## 2021-11-23 DIAGNOSIS — Z8542 Personal history of malignant neoplasm of other parts of uterus: Secondary | ICD-10-CM | POA: Insufficient documentation

## 2021-11-23 DIAGNOSIS — C55 Malignant neoplasm of uterus, part unspecified: Secondary | ICD-10-CM | POA: Diagnosis not present

## 2021-11-23 DIAGNOSIS — I890 Lymphedema, not elsewhere classified: Secondary | ICD-10-CM | POA: Insufficient documentation

## 2021-11-23 NOTE — Progress Notes (Signed)
?  [facility] ?OFFICE PROGRESS NOTE ? ? ?Diagnosis: Uterine cancer ? ?INTERVAL HISTORY:  ? ?Dana [name] returns as scheduled.  She continues letrozole.  No hot flashes or arthralgias.  She feels the right groin and thigh masses are smaller.  Diarrhea improved when she began Enterade. ? ?Objective: ? ?Vital signs in last 24 hours: ? ?Blood pressure 126/79, pulse 68, temperature 98.2 ?F (36.8 ?C), temperature source Oral, resp. rate 18, height _0  (1.575 m), weight 169 lb 12.8 oz (77 kg), SpO2 96 %. ?  ? ?Lymphatics: Firm 2 cm area of fullness at the lateral right inguinal canal without a discrete lymph node ?Resp: Lungs clear bilateral ?Cardio: Regular rate and rhythm ?GI: No hepatosplenomegaly ?Vascular: Edema throughout the left leg  ?Skin: 2 cm subcutaneous mass at the right anterior thigh ? ?Lab Results: ? ?Lab Results  ?Component Value Date  ? WBC 5.2 07/06/2021  ? HGB 12.9 07/06/2021  ? HCT 38.8 07/06/2021  ? MCV 100.3 (H) 07/06/2021  ? PLT 219 07/06/2021  ? NEUTROABS 3.4 07/06/2021  ? ? ?CMP  ?Lab Results  ?Component Value Date  ? NA 137 07/06/2021  ? K 4.0 07/06/2021  ? CL 104 07/06/2021  ? CO2 27 07/06/2021  ? GLUCOSE 94 07/06/2021  ? BUN 20 07/06/2021  ? CREATININE 0.97 07/06/2021  ? CALCIUM 9.0 07/06/2021  ? PROT 6.7 07/06/2021  ? ALBUMIN 4.0 07/06/2021  ? AST 13 (L) 07/06/2021  ? ALT 10 07/06/2021  ? ALKPHOS 76 07/06/2021  ? BILITOT 0.4 07/06/2021  ? GFRNONAA >60 07/06/2021  ? GFRAA >60 04/06/2020  ? ? ? ?Medications: I have reviewed the patient's current medications. ? ? ?Assessment/Plan: ?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, status post a TAH, BSO, appendectomy, pelvic and periaortic lymphadenectomy, and wedge resection of a liver lesion ?Gross complete resection, tumor involve both ovaries, lymph nodes negative ?6 cycles of adjuvant Taxol/carboplatin ?CT abdomen/pelvis 03/28/2019-enlarged right inguinal and iliac nodes ?04/10/2019-right inguinal lymph  node biopsy-metastatic adenocarcinoma, ER 90%, PR 95%, PD-L1 combined positive score-1, MSS, tumor mutation burden-4 ?Cisplatin 05/16/2019 20-1 cycle ?CT 12/72/020-decrease size of right inguinal and external iliac lymph nodes, no evidence of disease progression ?Everolimus and letrozole 07/14/2019 - 04/06/2020 ?CT 10/20/2019-resolution of right iliac lymph node, decrease in size of right inguinal node ?CT 03/08/2020-no evidence of recurrent or metastatic disease in the abdomen or pelvis ?CT 05/31/2020-no evidence of recurrent or metastatic disease ?CT 03/11/2021-increase right inguinal lymph node, enlargement of bilateral inguinal lymph nodes, subcutaneous soft tissue nodule at the anterior right thigh ?Carboplatin 04/06/2021, 04/25/2021, 05/16/2021 ?CT 07/12/2021-mild decrease in size of bilateral inguinal lymphadenopathy, no progressive disease in the abdomen or pelvis ?Letrozole 08/06/2021 ? ?2.  Left lower extremity lymphedema ? ?3.  Chronic diarrhea controlled with Lomotil ? ? ?Disposition: ?Ms. Friis appears stable.  She is tolerating the letrozole well.  There has been partial improvement in the right inguinal lymph node and right thigh mass since beginning letrozole.  She will continue letrozole. ? ?She will continue Lomotil as needed for diarrhea. ? ?Dana Coder, MD ? ?11/23/2021  ?9:19 AM ? ? ?

## 2022-01-02 ENCOUNTER — Telehealth: Payer: Self-pay

## 2022-01-02 DIAGNOSIS — E039 Hypothyroidism, unspecified: Secondary | ICD-10-CM | POA: Diagnosis not present

## 2022-01-02 DIAGNOSIS — E785 Hyperlipidemia, unspecified: Secondary | ICD-10-CM | POA: Diagnosis not present

## 2022-01-02 DIAGNOSIS — I10 Essential (primary) hypertension: Secondary | ICD-10-CM | POA: Diagnosis not present

## 2022-01-02 DIAGNOSIS — E559 Vitamin D deficiency, unspecified: Secondary | ICD-10-CM | POA: Diagnosis not present

## 2022-01-02 DIAGNOSIS — R5383 Other fatigue: Secondary | ICD-10-CM | POA: Diagnosis not present

## 2022-01-02 NOTE — Telephone Encounter (Signed)
Mrs. Kleiman called and stated she had being vomiting. The vomiting started on 6/13 through  6/16 and she is not doing well. She also had a little diarrhea. She take her lomotil for the diarrhea. And on Friday she take one compazine. She also feel weak. She have not being eating well.The patient denied any pain, shortness of breath, or fever. I spoke with Dr.Sherrill discuss  about the patient issue and he advice the patient to reach out to her PCP. Patient gave verbal understanding and had no further questions at this time.

## 2022-01-03 DIAGNOSIS — E669 Obesity, unspecified: Secondary | ICD-10-CM | POA: Diagnosis not present

## 2022-01-03 DIAGNOSIS — I10 Essential (primary) hypertension: Secondary | ICD-10-CM | POA: Diagnosis not present

## 2022-01-03 DIAGNOSIS — Z0000 Encounter for general adult medical examination without abnormal findings: Secondary | ICD-10-CM | POA: Diagnosis not present

## 2022-01-03 DIAGNOSIS — E039 Hypothyroidism, unspecified: Secondary | ICD-10-CM | POA: Diagnosis not present

## 2022-01-03 DIAGNOSIS — R1013 Epigastric pain: Secondary | ICD-10-CM | POA: Diagnosis not present

## 2022-01-03 DIAGNOSIS — M1990 Unspecified osteoarthritis, unspecified site: Secondary | ICD-10-CM | POA: Diagnosis not present

## 2022-01-03 DIAGNOSIS — I77810 Thoracic aortic ectasia: Secondary | ICD-10-CM | POA: Diagnosis not present

## 2022-01-03 DIAGNOSIS — E785 Hyperlipidemia, unspecified: Secondary | ICD-10-CM | POA: Diagnosis not present

## 2022-01-03 DIAGNOSIS — M810 Age-related osteoporosis without current pathological fracture: Secondary | ICD-10-CM | POA: Diagnosis not present

## 2022-01-03 DIAGNOSIS — R82998 Other abnormal findings in urine: Secondary | ICD-10-CM | POA: Diagnosis not present

## 2022-01-03 DIAGNOSIS — R5383 Other fatigue: Secondary | ICD-10-CM | POA: Diagnosis not present

## 2022-01-03 DIAGNOSIS — R112 Nausea with vomiting, unspecified: Secondary | ICD-10-CM | POA: Diagnosis not present

## 2022-01-03 DIAGNOSIS — Z8542 Personal history of malignant neoplasm of other parts of uterus: Secondary | ICD-10-CM | POA: Diagnosis not present

## 2022-01-04 DIAGNOSIS — Z0000 Encounter for general adult medical examination without abnormal findings: Secondary | ICD-10-CM | POA: Diagnosis not present

## 2022-01-25 ENCOUNTER — Inpatient Hospital Stay: Payer: Medicare HMO | Attending: Hematology and Oncology | Admitting: Oncology

## 2022-01-25 ENCOUNTER — Encounter: Payer: Self-pay | Admitting: Oncology

## 2022-01-25 VITALS — BP 130/78 | HR 75 | Temp 98.1°F | Resp 18 | Ht 62.0 in | Wt 166.2 lb

## 2022-01-25 DIAGNOSIS — C541 Malignant neoplasm of endometrium: Secondary | ICD-10-CM | POA: Diagnosis not present

## 2022-01-25 DIAGNOSIS — Z79811 Long term (current) use of aromatase inhibitors: Secondary | ICD-10-CM | POA: Diagnosis not present

## 2022-01-25 DIAGNOSIS — C55 Malignant neoplasm of uterus, part unspecified: Secondary | ICD-10-CM | POA: Diagnosis not present

## 2022-01-25 DIAGNOSIS — R112 Nausea with vomiting, unspecified: Secondary | ICD-10-CM | POA: Diagnosis not present

## 2022-01-25 NOTE — Progress Notes (Signed)
[place] OFFICE PROGRESS NOTE   Diagnosis: Endometrial cancer  INTERVAL HISTORY:   [name] returns as scheduled.  She continues letrozole.  She has not noted a significant change in the right thigh or right groin masses.  She had soreness at the left upper neck a few days ago.  This has improved.  She had an episode of nausea/vomiting and abdominal pain 3 weeks ago.  This lasted for approximately 10 days.  No fever, dysuria, or diarrhea.  The symptoms have resolved.  She was having bowel movements during this episode.  Objective:  Vital signs in last 24 hours:  Blood pressure 130/78, pulse 75, temperature 98.1 F (36.7 C), temperature source Oral, resp. rate 18, height 5' 2"  (1.575 m), weight 166 lb 3.2 oz (75.4 kg), SpO2 99 %.    HEENT: Neck without mass, mild tenderness at the upper left neck musculature Lymphatics: No cervical, supraclavicular, axillary, or left inguinal nodes, firm fullness without a discrete mass on deep palpation at the right inguinal canal Resp: Lungs clear bilaterally Cardio: Regular rate and rhythm GI: No hepatosplenomegaly, nontender, no mass Vascular: Edema throughout the left leg  Skin: 1.5-2 cm cutaneous mass at the right anterior thigh  Lab Results:  Lab Results  Component Value Date   WBC 5.2 07/06/2021   HGB 12.9 07/06/2021   HCT 38.8 07/06/2021   MCV 100.3 (H) 07/06/2021   PLT 219 07/06/2021   NEUTROABS 3.4 07/06/2021    CMP  Lab Results  Component Value Date   NA 137 07/06/2021   K 4.0 07/06/2021   CL 104 07/06/2021   CO2 27 07/06/2021   GLUCOSE 94 07/06/2021   BUN 20 07/06/2021   CREATININE 0.97 07/06/2021   CALCIUM 9.0 07/06/2021   PROT 6.7 07/06/2021   ALBUMIN 4.0 07/06/2021   AST 13 (L) 07/06/2021   ALT 10 07/06/2021   ALKPHOS 76 07/06/2021   BILITOT 0.4 07/06/2021   GFRNONAA >60 07/06/2021   GFRAA >60 04/06/2020    Medications: I have reviewed the patient's current  medications.   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, status post a TAH, BSO, appendectomy, pelvic and periaortic lymphadenectomy, and wedge resection of a liver lesion Gross complete resection, tumor involve both ovaries, lymph nodes negative 6 cycles of adjuvant Taxol/carboplatin CT abdomen/pelvis 03/28/2019-enlarged right inguinal and iliac nodes 04/10/2019-right inguinal lymph node biopsy-metastatic adenocarcinoma, ER 90%, PR 95%, PD-L1 combined positive score-1, MSS, tumor mutation burden-4 Cisplatin 05/16/2019 20-1 cycle CT 12/72/020-decrease size of right inguinal and external iliac lymph nodes, no evidence of disease progression Everolimus and letrozole 07/14/2019 - 04/06/2020 CT 10/20/2019-resolution of right iliac lymph node, decrease in size of right inguinal node CT 03/08/2020-no evidence of recurrent or metastatic disease in the abdomen or pelvis CT 05/31/2020-no evidence of recurrent or metastatic disease CT 03/11/2021-increase right inguinal lymph node, enlargement of bilateral inguinal lymph nodes, subcutaneous soft tissue nodule at the anterior right thigh Carboplatin 04/06/2021, 04/25/2021, 05/16/2021 CT 07/12/2021-mild decrease in size of bilateral inguinal lymphadenopathy, no progressive disease in the abdomen or pelvis Letrozole 08/06/2021  2.  Left lower extremity lymphedema  3.  Chronic diarrhea controlled with Lomotil    Disposition:  [name] appears unchanged.  The right thigh and inguinal masses appear stable versus slightly improved compared to when I saw her 2 months ago.  She will continue letrozole.  She will undergo a restaging CT evaluation prior to an office visit in 2 months.  The etiology of  the acute episode of abdominal pain with nausea/vomiting is unclear.  I doubt this was related to the endometrial cancer.  She will contact me for recurrent GI symptoms.  Dana Coder, MD  01/25/2022  8:40 AM

## 2022-01-29 ENCOUNTER — Other Ambulatory Visit: Payer: Self-pay | Admitting: Nurse Practitioner

## 2022-01-29 DIAGNOSIS — C55 Malignant neoplasm of uterus, part unspecified: Secondary | ICD-10-CM

## 2022-02-21 ENCOUNTER — Other Ambulatory Visit: Payer: Self-pay

## 2022-02-21 DIAGNOSIS — C55 Malignant neoplasm of uterus, part unspecified: Secondary | ICD-10-CM

## 2022-04-10 ENCOUNTER — Inpatient Hospital Stay: Payer: Medicare HMO | Attending: Hematology and Oncology

## 2022-04-10 ENCOUNTER — Ambulatory Visit (HOSPITAL_BASED_OUTPATIENT_CLINIC_OR_DEPARTMENT_OTHER)
Admission: RE | Admit: 2022-04-10 | Discharge: 2022-04-10 | Disposition: A | Discharge status: Home / Self Care | Payer: Medicare HMO | Source: Ambulatory Visit | Attending: Oncology | Admitting: Oncology

## 2022-04-10 ENCOUNTER — Encounter (HOSPITAL_BASED_OUTPATIENT_CLINIC_OR_DEPARTMENT_OTHER): Payer: Self-pay

## 2022-04-10 DIAGNOSIS — Z170 Estrogen receptor positive status [ER+]: Secondary | ICD-10-CM | POA: Insufficient documentation

## 2022-04-10 DIAGNOSIS — C541 Malignant neoplasm of endometrium: Secondary | ICD-10-CM | POA: Insufficient documentation

## 2022-04-10 DIAGNOSIS — C55 Malignant neoplasm of uterus, part unspecified: Secondary | ICD-10-CM | POA: Insufficient documentation

## 2022-04-10 DIAGNOSIS — C774 Secondary and unspecified malignant neoplasm of inguinal and lower limb lymph nodes: Secondary | ICD-10-CM | POA: Diagnosis not present

## 2022-04-10 DIAGNOSIS — N281 Cyst of kidney, acquired: Secondary | ICD-10-CM | POA: Diagnosis not present

## 2022-04-10 DIAGNOSIS — M7989 Other specified soft tissue disorders: Secondary | ICD-10-CM | POA: Diagnosis not present

## 2022-04-10 DIAGNOSIS — Z79811 Long term (current) use of aromatase inhibitors: Secondary | ICD-10-CM | POA: Diagnosis not present

## 2022-04-10 LAB — CMP (CANCER CENTER ONLY)
ALT: 11 U/L (ref 0–44)
AST: 15 U/L (ref 15–41)
Albumin: 4.2 g/dL (ref 3.5–5.0)
Alkaline Phosphatase: 64 U/L (ref 38–126)
Anion gap: 8 (ref 5–15)
BUN: 14 mg/dL (ref 8–23)
CO2: 27 mmol/L (ref 22–32)
Calcium: 9.2 mg/dL (ref 8.9–10.3)
Chloride: 100 mmol/L (ref 98–111)
Creatinine: 0.87 mg/dL (ref 0.44–1.00)
GFR, Estimated: >60 mL/min
Glucose, Bld: 93 mg/dL (ref 70–99)
Potassium: 4.1 mmol/L (ref 3.5–5.1)
Sodium: 135 mmol/L (ref 135–145)
Total Bilirubin: 0.5 mg/dL (ref 0.3–1.2)
Total Protein: 6.6 g/dL (ref 6.5–8.1)

## 2022-04-10 MED ORDER — IOHEXOL 300 MG/ML  SOLN
100.0000 mL | Freq: Once | INTRAMUSCULAR | Status: AC | PRN
  Administered 2022-04-10: 100 mL via INTRAVENOUS

## 2022-04-12 ENCOUNTER — Inpatient Hospital Stay: Payer: Medicare HMO | Admitting: Oncology

## 2022-04-12 VITALS — BP 132/82 | HR 67 | Temp 98.2°F | Resp 18 | Ht 62.0 in | Wt 166.8 lb

## 2022-04-12 DIAGNOSIS — Z79811 Long term (current) use of aromatase inhibitors: Secondary | ICD-10-CM | POA: Diagnosis not present

## 2022-04-12 DIAGNOSIS — C55 Malignant neoplasm of uterus, part unspecified: Secondary | ICD-10-CM | POA: Diagnosis not present

## 2022-04-12 DIAGNOSIS — C774 Secondary and unspecified malignant neoplasm of inguinal and lower limb lymph nodes: Secondary | ICD-10-CM | POA: Diagnosis not present

## 2022-04-12 DIAGNOSIS — C541 Malignant neoplasm of endometrium: Secondary | ICD-10-CM | POA: Diagnosis not present

## 2022-04-12 DIAGNOSIS — Z170 Estrogen receptor positive status [ER+]: Secondary | ICD-10-CM | POA: Diagnosis not present

## 2022-04-12 NOTE — Progress Notes (Signed)
[place] OFFICE PROGRESS NOTE   Diagnosis: Uterine cancer  INTERVAL HISTORY:   Dana [name] returns as scheduled.  She continues letrozole.  She is exercising.  The right inguinal mass is occasionally sore.  She feels the right thigh lesion is larger.  Objective:  Vital signs in last 24 hours:  Blood pressure 132/82, pulse 67, temperature 98.2 F (36.8 C), temperature source Oral, resp. rate 18, height 5' 2"  (1.575 m), weight 166 lb 12.8 oz (75.7 kg), SpO2 100 %.    Lymphatics: No cervical, supraclavicular, axillary, or left inguinal nodes.  Firm 2-3 cm lateral right inguinal node Resp: Lungs clear bilaterally Cardio: Regular rate and rhythm GI: No hepatosplenomegaly, no mass, nontender Vascular: Edema throughout the left leg  Skin: Approximate 2 cm subcutaneous mass at the right anterior thigh  Portacath/PICC-without erythema  Lab Results:  Lab Results  Component Value Date   WBC 5.2 07/06/2021   HGB 12.9 07/06/2021   HCT 38.8 07/06/2021   MCV 100.3 (H) 07/06/2021   PLT 219 07/06/2021   NEUTROABS 3.4 07/06/2021    CMP  Lab Results  Component Value Date   NA 135 04/10/2022   K 4.1 04/10/2022   CL 100 04/10/2022   CO2 27 04/10/2022   GLUCOSE 93 04/10/2022   BUN 14 04/10/2022   CREATININE 0.87 04/10/2022   CALCIUM 9.2 04/10/2022   PROT 6.6 04/10/2022   ALBUMIN 4.2 04/10/2022   AST 15 04/10/2022   ALT 11 04/10/2022   ALKPHOS 64 04/10/2022   BILITOT 0.5 04/10/2022   GFRNONAA >60 04/10/2022   GFRAA >60 04/06/2020    No results found for: "CEA1", "CEA", "OIZ124", "CA125"  Lab Results  Component Value Date   INR 0.9 05/15/2019   LABPROT 11.6 05/15/2019    Imaging:   Medications: I have reviewed the patient's current medications.   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, status post a TAH, BSO, appendectomy, pelvic and periaortic lymphadenectomy, and wedge resection of a liver  lesion Gross complete resection, tumor involve both ovaries, lymph nodes negative 6 cycles of adjuvant Taxol/carboplatin CT abdomen/pelvis 03/28/2019-enlarged right inguinal and iliac nodes 04/10/2019-right inguinal lymph node biopsy-metastatic adenocarcinoma, ER 90%, PR 95%, PD-L1 combined positive score-1, MSS, tumor mutation burden-4 Cisplatin 05/16/2019 20-1 cycle CT 12/72/020-decrease size of right inguinal and external iliac lymph nodes, no evidence of disease progression Everolimus and letrozole 07/14/2019 - 04/06/2020 CT 10/20/2019-resolution of right iliac lymph node, decrease in size of right inguinal node CT 03/08/2020-no evidence of recurrent or metastatic disease in the abdomen or pelvis CT 05/31/2020-no evidence of recurrent or metastatic disease CT 03/11/2021-increase right inguinal lymph node, enlargement of bilateral inguinal lymph nodes, subcutaneous soft tissue nodule at the anterior right thigh Carboplatin 04/06/2021, 04/25/2021, 05/16/2021 CT 07/12/2021-mild decrease in size of bilateral inguinal lymphadenopathy, no progressive disease in the abdomen or pelvis, 0.6 cm left inguinal node was 0.8 cm previously Letrozole 08/06/2021 CT abdomen/pelvis 04/10/2022-stable right inguinal lymph node, no new site of metastatic disease CT right femur nine 04/10/2022 -2.2 x 1 cm anterior subcutaneous mass  2.  Left lower extremity lymphedema  3.  Chronic diarrhea controlled with Lomotil     Disposition: Dana [name] appears unchanged.  The restaging CTs reveal no evidence of disease progression.  I reviewed the CT findings and images with her.  I measured the right inguinal node and it appears unchanged compared to December 2022.  The right thigh lesion appears unchanged compared to a CT Abdo/pelvis  from August 2022.  The right thigh mass appears slightly larger compared to when I saw her in May.  We discussed treatment options.  We discussed continuing letrozole versus repeat treatment with  carboplatin.  She prefers continuing letrozole.  She would like to return for a follow-up visit in 6 months.  She will call in the interim for further enlargement of the right thigh lesion or new symptoms.  Betsy Coder, MD  04/12/2022  8:36 AM

## 2022-06-23 ENCOUNTER — Telehealth: Payer: Self-pay

## 2022-06-23 NOTE — Telephone Encounter (Signed)
Dana [name] called to report her right leg is bigger. She is not in pain, but it's red before the knee. The fluids starts at the groin going down her leg. It's getting very difficulty to walk both thighs are rubbing together.  She wants to come for a office visit.

## 2022-06-26 ENCOUNTER — Inpatient Hospital Stay: Payer: Medicare HMO | Attending: Hematology and Oncology | Admitting: Oncology

## 2022-06-26 ENCOUNTER — Inpatient Hospital Stay: Payer: Medicare HMO

## 2022-06-26 VITALS — BP 145/88 | HR 84 | Temp 98.1°F | Resp 20 | Ht 62.0 in | Wt 171.2 lb

## 2022-06-26 DIAGNOSIS — Z90722 Acquired absence of ovaries, bilateral: Secondary | ICD-10-CM | POA: Insufficient documentation

## 2022-06-26 DIAGNOSIS — C541 Malignant neoplasm of endometrium: Secondary | ICD-10-CM | POA: Diagnosis not present

## 2022-06-26 DIAGNOSIS — Z90710 Acquired absence of both cervix and uterus: Secondary | ICD-10-CM | POA: Insufficient documentation

## 2022-06-26 DIAGNOSIS — R112 Nausea with vomiting, unspecified: Secondary | ICD-10-CM | POA: Diagnosis not present

## 2022-06-26 DIAGNOSIS — Z9079 Acquired absence of other genital organ(s): Secondary | ICD-10-CM | POA: Insufficient documentation

## 2022-06-26 DIAGNOSIS — Z79811 Long term (current) use of aromatase inhibitors: Secondary | ICD-10-CM | POA: Insufficient documentation

## 2022-06-26 DIAGNOSIS — Z9221 Personal history of antineoplastic chemotherapy: Secondary | ICD-10-CM | POA: Insufficient documentation

## 2022-06-26 DIAGNOSIS — C55 Malignant neoplasm of uterus, part unspecified: Secondary | ICD-10-CM

## 2022-06-26 DIAGNOSIS — R229 Localized swelling, mass and lump, unspecified: Secondary | ICD-10-CM | POA: Insufficient documentation

## 2022-06-26 LAB — CMP (CANCER CENTER ONLY)
ALT: 11 U/L (ref 0–44)
AST: 15 U/L (ref 15–41)
Albumin: 3.8 g/dL (ref 3.5–5.0)
Alkaline Phosphatase: 65 U/L (ref 38–126)
Anion gap: 10 (ref 5–15)
BUN: 9 mg/dL (ref 8–23)
CO2: 27 mmol/L (ref 22–32)
Calcium: 9.2 mg/dL (ref 8.9–10.3)
Chloride: 102 mmol/L (ref 98–111)
Creatinine: 0.81 mg/dL (ref 0.44–1.00)
GFR, Estimated: >60 mL/min (ref >60)
Glucose, Bld: 96 mg/dL (ref 70–99)
Potassium: 3.9 mmol/L (ref 3.5–5.1)
Sodium: 139 mmol/L (ref 135–145)
Total Bilirubin: 0.5 mg/dL (ref 0.3–1.2)
Total Protein: 6.2 g/dL — ABNORMAL LOW (ref 6.5–8.1)

## 2022-06-26 LAB — CBC WITH DIFFERENTIAL (CANCER CENTER ONLY)
Abs Immature Granulocytes: 0.02 10*3/uL (ref 0.00–0.07)
Basophils Absolute: 0 10*3/uL (ref 0.0–0.1)
Basophils Relative: 1 %
Eosinophils Absolute: 0.1 10*3/uL (ref 0.0–0.5)
Eosinophils Relative: 2 %
HCT: 42.1 % (ref 36.0–46.0)
Hemoglobin: 14.1 g/dL (ref 12.0–15.0)
Immature Granulocytes: 0 %
Lymphocytes Relative: 21 %
Lymphs Abs: 1.1 10*3/uL (ref 0.7–4.0)
MCH: 31.1 pg (ref 26.0–34.0)
MCHC: 33.5 g/dL (ref 30.0–36.0)
MCV: 92.7 fL (ref 80.0–100.0)
Monocytes Absolute: 0.5 10*3/uL (ref 0.1–1.0)
Monocytes Relative: 10 %
Neutro Abs: 3.6 10*3/uL (ref 1.7–7.7)
Neutrophils Relative %: 66 %
Platelet Count: 256 10*3/uL (ref 150–400)
RBC: 4.54 MIL/uL (ref 3.87–5.11)
RDW: 12.7 % (ref 11.5–15.5)
WBC Count: 5.4 10*3/uL (ref 4.0–10.5)
nRBC: 0 % (ref 0.0–0.2)

## 2022-06-26 NOTE — Progress Notes (Signed)
[place] OFFICE PROGRESS NOTE   Diagnosis: Uterine cancer  INTERVAL HISTORY:   Dana [name] returns prior to a scheduled visit.  She reports increased left leg swelling for the past 3 to 4 weeks.  A lymphedema pump has not helped.  No pain.  She has noted enlargement of the lesion at the right anterior thigh.  She has intermittent episodes of nausea and vomiting over the past month.  She reports dysphagia.  She is taking letrozole.  Objective:  Vital signs in last 24 hours:  Blood pressure (!) 145/88, pulse 84, temperature 98.1 F (36.7 C), temperature source Oral, resp. rate 20, height _0  (1.575 m), weight 171 lb 3.2 oz (77.7 kg), SpO2 100 %.    Findings: No cervical, supraclavicular, or axillary nodes.  No left inguinal nodes.  Firm right inguinal node Resp: Lungs clear bilaterally Cardio: Regular rate and rhythm GI: Soft and nontender, no mass, no hepatosplenomegaly Vascular: Tense edema throughout the left leg with chronic skin change  Skin: 2 x 2 centimeter subcutaneous mass at the anterior right upper thigh  Portacath/PICC-without erythema  Lab Results:  Lab Results  Component Value Date   WBC 5.2 07/06/2021   HGB 12.9 07/06/2021   HCT 38.8 07/06/2021   MCV 100.3 (H) 07/06/2021   PLT 219 07/06/2021   NEUTROABS 3.4 07/06/2021    CMP  Lab Results  Component Value Date   NA 135 04/10/2022   K 4.1 04/10/2022   CL 100 04/10/2022   CO2 27 04/10/2022   GLUCOSE 93 04/10/2022   BUN 14 04/10/2022   CREATININE 0.87 04/10/2022   CALCIUM 9.2 04/10/2022   PROT 6.6 04/10/2022   ALBUMIN 4.2 04/10/2022   AST 15 04/10/2022   ALT 11 04/10/2022   ALKPHOS 64 04/10/2022   BILITOT 0.5 04/10/2022   GFRNONAA >60 04/10/2022   GFRAA >60 04/06/2020    Medications: I have reviewed the patient's current medications.   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, status post a TAH, BSO, appendectomy, pelvic and  periaortic lymphadenectomy, and wedge resection of a liver lesion Gross complete resection, tumor involve both ovaries, lymph nodes negative 6 cycles of adjuvant Taxol/carboplatin CT abdomen/pelvis 03/28/2019-enlarged right inguinal and iliac nodes 04/10/2019-right inguinal lymph node biopsy-metastatic adenocarcinoma, ER 90%, PR 95%, PD-L1 combined positive score-1, MSS, tumor mutation burden-4 Cisplatin 05/16/2019 20-1 cycle CT 12/72/020-decrease size of right inguinal and external iliac lymph nodes, no evidence of disease progression Everolimus and letrozole 07/14/2019 - 04/06/2020 CT 10/20/2019-resolution of right iliac lymph node, decrease in size of right inguinal node CT 03/08/2020-no evidence of recurrent or metastatic disease in the abdomen or pelvis CT 05/31/2020-no evidence of recurrent or metastatic disease CT 03/11/2021-increase right inguinal lymph node, enlargement of bilateral inguinal lymph nodes, subcutaneous soft tissue nodule at the anterior right thigh Carboplatin 04/06/2021, 04/25/2021, 05/16/2021 CT 07/12/2021-mild decrease in size of bilateral inguinal lymphadenopathy, no progressive disease in the abdomen or pelvis, 0.6 cm left inguinal node was 0.8 cm previously Letrozole 08/06/2021 CT abdomen/pelvis 04/10/2022-stable right inguinal lymph node, no new site of metastatic disease CT right femur nine 04/10/2022 -2.2 x 1 cm anterior subcutaneous mass  2.  Left lower extremity lymphedema  3.  Chronic diarrhea controlled with Lomotil       Disposition: Dana [name] has a history of metastatic endometrial cancer.  She has been maintained on letrozole since January of this year.  She presents today with multiple complaints including intermittent nausea/vomiting and progressive left  leg edema.  The right anterior thigh mass appears larger.  The etiology of the nausea and vomiting is unclear.  She agrees to a staging CT evaluation.  She would like to wait until after the holidays.  She  will call for increased symptoms in the interim.  Treatment options for disease progression will include repeat treatment with cisplatin or carboplatin and pembrolizumab/lenvatinib.  Dana Coder, MD  06/26/2022  10:10 AM

## 2022-07-19 ENCOUNTER — Ambulatory Visit (HOSPITAL_BASED_OUTPATIENT_CLINIC_OR_DEPARTMENT_OTHER)
Admission: RE | Admit: 2022-07-19 | Discharge: 2022-07-19 | Disposition: A | Discharge status: Home / Self Care | Payer: Medicare HMO | Source: Ambulatory Visit | Attending: Oncology | Admitting: Oncology

## 2022-07-19 DIAGNOSIS — C55 Malignant neoplasm of uterus, part unspecified: Secondary | ICD-10-CM

## 2022-07-19 DIAGNOSIS — C541 Malignant neoplasm of endometrium: Secondary | ICD-10-CM | POA: Diagnosis not present

## 2022-07-19 DIAGNOSIS — R2241 Localized swelling, mass and lump, right lower limb: Secondary | ICD-10-CM | POA: Diagnosis not present

## 2022-07-19 DIAGNOSIS — K7689 Other specified diseases of liver: Secondary | ICD-10-CM | POA: Diagnosis not present

## 2022-07-19 DIAGNOSIS — K449 Diaphragmatic hernia without obstruction or gangrene: Secondary | ICD-10-CM | POA: Diagnosis not present

## 2022-07-19 MED ORDER — IOHEXOL 300 MG/ML  SOLN
100.0000 mL | Freq: Once | INTRAMUSCULAR | Status: AC | PRN
  Administered 2022-07-19: 100 mL via INTRAVENOUS

## 2022-07-20 DIAGNOSIS — H43813 Vitreous degeneration, bilateral: Secondary | ICD-10-CM | POA: Diagnosis not present

## 2022-07-20 DIAGNOSIS — Q141 Congenital malformation of retina: Secondary | ICD-10-CM | POA: Diagnosis not present

## 2022-07-20 DIAGNOSIS — Z961 Presence of intraocular lens: Secondary | ICD-10-CM | POA: Diagnosis not present

## 2022-07-21 ENCOUNTER — Encounter: Payer: Self-pay | Admitting: Oncology

## 2022-07-21 ENCOUNTER — Inpatient Hospital Stay: Payer: Medicare HMO | Attending: Hematology and Oncology | Admitting: Oncology

## 2022-07-21 VITALS — BP 144/87 | HR 78 | Temp 98.2°F | Resp 20 | Ht 62.0 in | Wt 175.0 lb

## 2022-07-21 DIAGNOSIS — Z79811 Long term (current) use of aromatase inhibitors: Secondary | ICD-10-CM | POA: Diagnosis not present

## 2022-07-21 DIAGNOSIS — C55 Malignant neoplasm of uterus, part unspecified: Secondary | ICD-10-CM

## 2022-07-21 DIAGNOSIS — R2241 Localized swelling, mass and lump, right lower limb: Secondary | ICD-10-CM | POA: Diagnosis not present

## 2022-07-21 DIAGNOSIS — I890 Lymphedema, not elsewhere classified: Secondary | ICD-10-CM | POA: Diagnosis not present

## 2022-07-21 DIAGNOSIS — C541 Malignant neoplasm of endometrium: Secondary | ICD-10-CM | POA: Diagnosis not present

## 2022-07-21 NOTE — Progress Notes (Signed)
[place] OFFICE PROGRESS NOTE   Diagnosis: Uterine cancer  INTERVAL HISTORY:   [name] returns as scheduled.  She continues letrozole.  No hot flashes or arthralgias.  She has persistent edema throughout the left leg, now up to the waist.  There is a persistent nodule at the right thigh.  She goes to the gym daily.  Objective:  Vital signs in last 24 hours:  Blood pressure (!) 144/87, pulse 78, temperature 98.2 F (36.8 C), temperature source Oral, resp. rate 20, height '5\' 2"'$  (1.575 m), weight 175 lb (79.4 kg), SpO2 100 %.    Lymphatics: No cervical, supraclavicular, axillary, or left inguinal nodes.  Firm nodal mass in the lateral right inguinal canal Resp: Lungs clear bilaterally Cardio: Regular rate and rhythm GI: No hepatosplenomegaly, no mass Vascular: Edema throughout the left leg  Skin: 3 cm firm mass at the right anterior thigh  Lab Results:  Lab Results  Component Value Date   WBC 5.4 06/26/2022   HGB 14.1 06/26/2022   HCT 42.1 06/26/2022   MCV 92.7 06/26/2022   PLT 256 06/26/2022   NEUTROABS 3.6 06/26/2022    CMP  Lab Results  Component Value Date   NA 139 06/26/2022   K 3.9 06/26/2022   CL 102 06/26/2022   CO2 27 06/26/2022   GLUCOSE 96 06/26/2022   BUN 9 06/26/2022   CREATININE 0.81 06/26/2022   CALCIUM 9.2 06/26/2022   PROT 6.2 (L) 06/26/2022   ALBUMIN 3.8 06/26/2022   AST 15 06/26/2022   ALT 11 06/26/2022   ALKPHOS 65 06/26/2022   BILITOT 0.5 06/26/2022   GFRNONAA >60 06/26/2022   GFRAA >60 04/06/2020    Imaging:  CT FEMUR RIGHT W CONTRAST  Result Date: 07/20/2022 CLINICAL DATA:  History of endometrial carcinoma. Follow up anterior thigh lesion. EXAM: CT OF THE LOWER RIGHT EXTREMITY WITH CONTRAST TECHNIQUE: Multidetector CT imaging of the right thigh was performed according to the standard protocol following intravenous contrast administration. RADIATION DOSE REDUCTION: This exam was performed according to the  departmental dose-optimization program which includes automated exposure control, adjustment of the mA and/or kV according to patient size and/or use of iterative reconstruction technique. CONTRAST:  160m OMNIPAQUE IOHEXOL 300 MG/ML  SOLN COMPARISON:  CT of the right thigh 04/10/2022, pelvic CT 03/10/2021 and PET-CT 04/22/2019. FINDINGS: Findings for CT of the chest, abdomen and pelvis are dictated separately. Bones/Joint/Cartilage Status post right total hip arthroplasty. No evidence of hardware loosening, acute fracture or dislocation. No evidence of osseous metastatic disease. No significant hip or knee joint effusion. Ligaments Suboptimally assessed by CT. Muscles and Tendons No intramuscular masses, fluid collections or abnormal enhancement identified within the right thigh. The extensor mechanism appears intact. Soft tissues Right inguinal nodal mass measures 2.8 x 1.7 cm on image 32/4 (similar to previous study). The subcutaneous mass anteriorly in the proximal right femur has mildly enlarged, measuring up to 2.3 x 1.2 cm transverse on image 106/4 and 1.9 cm on sagittal image 87/9 (previously 1.8 x 1.1 x 1.4 cm). Associated dermal thickening in this area appears unchanged. No new or other enlarging soft tissue nodules are identified. IMPRESSION: 1. Mild enlargement of the subcutaneous mass anteriorly in the proximal right femur. Imaging features remain nonspecific by CT, but given enlargement, this could reflect a metastasis or soft tissue sarcoma. Consider further evaluation with PET-CT and/or tissue sampling. 2. Stable right inguinal nodal mass. 3. No new or other enlarging soft tissue nodules identified. 4. No evidence  of osseous metastatic disease. 5. Findings for the CT of the chest, abdomen and pelvis are dictated separately. Electronically Signed   By: Richardean Sale M.D.   On: 07/20/2022 14:06   CT CHEST ABDOMEN PELVIS W CONTRAST  Result Date: 07/20/2022 CLINICAL DATA:  Restaging of metastatic  endometrial carcinoma. Lymphoma diagnosed 8 years ago. * Tracking Code: BO * EXAM: CT CHEST, ABDOMEN, AND PELVIS WITH CONTRAST TECHNIQUE: Multidetector CT imaging of the chest, abdomen and pelvis was performed following the standard protocol during bolus administration of intravenous contrast. RADIATION DOSE REDUCTION: This exam was performed according to the departmental dose-optimization program which includes automated exposure control, adjustment of the mA and/or kV according to patient size and/or use of iterative reconstruction technique. CONTRAST:  161m OMNIPAQUE IOHEXOL 300 MG/ML  SOLN COMPARISON:  Abdominopelvic CT 04/10/2022. PET 04/22/2019. No prior diagnostic chest CTs. FINDINGS: CT CHEST FINDINGS Cardiovascular: Aortic atherosclerosis. Tortuous thoracic aorta. Borderline cardiomegaly. Mediastinum/Nodes: No supraclavicular adenopathy. No mediastinal or hilar adenopathy. Tiny hiatal hernia. Lungs/Pleura: No pleural fluid. Minimal pleural or subpleural nodularity along the left hemidiaphragm at 2 mm on 40/5, not readily apparent on the prior. Lungs are otherwise clear. Musculoskeletal: No acute osseous abnormality. CT ABDOMEN PELVIS FINDINGS Hepatobiliary: Bilateral hepatic cysts, without suspicious liver lesion or biliary abnormality. Pancreas: Normal, without mass or ductal dilatation. Spleen: Normal in size, without focal abnormality. Adrenals/Urinary Tract: Normal adrenal glands. Normal kidneys, without hydronephrosis. Degraded evaluation of the pelvis, secondary to beam hardening artifact from right hip arthroplasty. Grossly normal urinary bladder. Left pelvic calcification is just caudal to the distal left ureter and presumably vascular. Stomach/Bowel: Proximal gastric underdistention. Scattered colonic diverticula. Normal terminal ileum. Normal small bowel. Vascular/Lymphatic: Aortic atherosclerosis. No abdominal adenopathy. Right inguinal node measures 2.5 x 2.1 cm on 101/2 versus similar on the  prior exam (when remeasured). Reproductive: Hysterectomy.  No adnexal mass. Other: No significant free fluid. No free intraperitoneal air. No evidence of omental or peritoneal disease. Again identified is edema about the pannus including on 100/2. This is eccentric right. Similar small fat containing ventral abdominal wall hernia. Contiguous or separate more cephalad tiny fat containing hernia on 58/2. Musculoskeletal: Right hip arthroplasty.  Lumbosacral spondylosis. IMPRESSION: 1. Similar isolated right inguinal adenopathy. 2. No new or progressive disease. 3. Minimal nodularity along the left hemidiaphragm is not readily apparent on the prior exam. Most likely an incidental benign subpleural lymph node. Recommend attention on follow-up. 4. Edema about the pannus again identified. Correlate with symptoms of panniculitis. 5.  Tiny hiatal hernia. Electronically Signed   By: KAbigail MiyamotoM.D.   On: 07/20/2022 10:27    Medications: I have reviewed the patient's current medications.   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, status post a TAH, BSO, appendectomy, pelvic and periaortic lymphadenectomy, and wedge resection of a liver lesion Gross complete resection, tumor involve both ovaries, lymph nodes negative 6 cycles of adjuvant Taxol/carboplatin CT abdomen/pelvis 03/28/2019-enlarged right inguinal and iliac nodes 04/10/2019-right inguinal lymph node biopsy-metastatic adenocarcinoma, ER 90%, PR 95%, PD-L1 combined positive score-1, MSS, tumor mutation burden-4 Cisplatin 05/16/2019 20-1 cycle Radiation to the lateral pelvis and 1 lymph nodes November 2020 CT 12/72/020-decrease size of right inguinal and external iliac lymph nodes, no evidence of disease progression Everolimus and letrozole 07/14/2019 - 04/06/2020 CT 10/20/2019-resolution of right iliac lymph node, decrease in size of right inguinal node CT 03/08/2020-no evidence of recurrent or metastatic  disease in the abdomen or pelvis CT 05/31/2020-no evidence of recurrent or metastatic  disease CT 03/11/2021-increase right inguinal lymph node, enlargement of bilateral inguinal lymph nodes, subcutaneous soft tissue nodule at the anterior right thigh Carboplatin 04/06/2021, 04/25/2021, 05/16/2021 CT 07/12/2021-mild decrease in size of bilateral inguinal lymphadenopathy, no progressive disease in the abdomen or pelvis, 0.6 cm left inguinal node was 0.8 cm previously Letrozole 08/06/2021 CT abdomen/pelvis 04/10/2022-stable right inguinal lymph node, no new site of metastatic disease CT right femur  04/10/2022 -2.2 x 1 cm anterior subcutaneous mass Letrozole continued CT right femur 07/19/2022-mild enlargement of right thigh subcutaneous mass stable right inguinal nodal mass CTs 07/19/2022-stable right inguinal lymph node, no new or progressive disease  2.  Left lower extremity lymphedema  3.  Chronic diarrhea controlled with Lomotil       Disposition: [name] has metastatic endometrial cancer.  She is being treated with letrozole.  Her overall clinical status is unchanged.  The restaging CTs reveal slight enlargement of the right anterior thigh mass compared to September, a stable right inguinal lymph node, and no other sign of metastatic disease.  We discussed treatment options including continuation of letrozole, changing to a different hormonal agent, and resuming systemic chemotherapy.  She favors continuing letrozole.  We will refer her to the lymphedema clinic.  [name] will return for an office visit in 2 months.  I reviewed the CT findings and images with her.  Betsy Coder, MD  07/21/2022  9:20 AM

## 2022-08-04 DIAGNOSIS — I10 Essential (primary) hypertension: Secondary | ICD-10-CM | POA: Diagnosis not present

## 2022-08-04 DIAGNOSIS — H26492 Other secondary cataract, left eye: Secondary | ICD-10-CM | POA: Diagnosis not present

## 2022-08-04 DIAGNOSIS — Z961 Presence of intraocular lens: Secondary | ICD-10-CM | POA: Diagnosis not present

## 2022-09-20 ENCOUNTER — Inpatient Hospital Stay: Payer: Medicare HMO | Attending: Hematology and Oncology | Admitting: Oncology

## 2022-09-20 VITALS — BP 137/81 | HR 84 | Temp 98.2°F | Resp 18 | Ht 62.0 in | Wt 172.0 lb

## 2022-09-20 DIAGNOSIS — Z79811 Long term (current) use of aromatase inhibitors: Secondary | ICD-10-CM | POA: Insufficient documentation

## 2022-09-20 DIAGNOSIS — K529 Noninfective gastroenteritis and colitis, unspecified: Secondary | ICD-10-CM | POA: Insufficient documentation

## 2022-09-20 DIAGNOSIS — C55 Malignant neoplasm of uterus, part unspecified: Secondary | ICD-10-CM

## 2022-09-20 DIAGNOSIS — C541 Malignant neoplasm of endometrium: Secondary | ICD-10-CM | POA: Diagnosis not present

## 2022-09-20 DIAGNOSIS — R2241 Localized swelling, mass and lump, right lower limb: Secondary | ICD-10-CM | POA: Diagnosis not present

## 2022-09-20 DIAGNOSIS — C774 Secondary and unspecified malignant neoplasm of inguinal and lower limb lymph nodes: Secondary | ICD-10-CM | POA: Insufficient documentation

## 2022-09-20 DIAGNOSIS — I890 Lymphedema, not elsewhere classified: Secondary | ICD-10-CM | POA: Diagnosis not present

## 2022-09-20 NOTE — Progress Notes (Signed)
[place] OFFICE PROGRESS NOTE   Diagnosis: Uterine cancer  INTERVAL HISTORY:   Dana [name] returns as scheduled.  She continues letrozole.  She reports intermittent episodes of nausea/vomiting and diarrhea lasting for several days.  This occurs every few months.  The most recent episode began on 09/14/2022.  She feels better at present.  She develops abdominal discomfort prior to vomiting.  The pain resolves after episodes of vomiting.  She believes the symptoms are related to her diet.  She has not been able to narrow down to a specific food.  She feels the right thigh mass is slowly enlarging.  She has intermittent symptoms of positional vertigo.  Objective:  Vital signs in last 24 hours:  Blood pressure 137/81, pulse 84, temperature 98.2 F (36.8 C), temperature source Oral, resp. rate 18, height '5\' 2"'$  (1.575 m), weight 172 lb (78 kg), SpO2 99 %.     Lymphatics: No cervical, supraclavicular, axillary, or left inguinal nodes.  Firm node in the lateral right inguinal region Resp: Lungs clear bilaterally Cardio: Regular rate and rhythm GI: No hepatosplenomegaly, mild right subcostal tenderness, no mass, no apparent ascites Vascular: Edema throughout the left leg  Skin: Right anterior thigh mass measures approximately 3 cm in horizontal dimension and 2 cm vertically   Lab Results:  Lab Results  Component Value Date   WBC 5.4 06/26/2022   HGB 14.1 06/26/2022   HCT 42.1 06/26/2022   MCV 92.7 06/26/2022   PLT 256 06/26/2022   NEUTROABS 3.6 06/26/2022    CMP  Lab Results  Component Value Date   NA 139 06/26/2022   K 3.9 06/26/2022   CL 102 06/26/2022   CO2 27 06/26/2022   GLUCOSE 96 06/26/2022   BUN 9 06/26/2022   CREATININE 0.81 06/26/2022   CALCIUM 9.2 06/26/2022   PROT 6.2 (L) 06/26/2022   ALBUMIN 3.8 06/26/2022   AST 15 06/26/2022   ALT 11 06/26/2022   ALKPHOS 65 06/26/2022   BILITOT 0.5 06/26/2022   GFRNONAA >60 06/26/2022   GFRAA >60  04/06/2020     Medications: I have reviewed the patient's current medications.   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, status post a TAH, BSO, appendectomy, pelvic and periaortic lymphadenectomy, and wedge resection of a liver lesion Gross complete resection, tumor involve both ovaries, lymph nodes negative 6 cycles of adjuvant Taxol/carboplatin CT abdomen/pelvis 03/28/2019-enlarged right inguinal and iliac nodes 04/10/2019-right inguinal lymph node biopsy-metastatic adenocarcinoma, ER 90%, PR 95%, PD-L1 combined positive score-1, MSS, tumor mutation burden-4 Cisplatin 05/16/2019 20-1 cycle Radiation to the lateral pelvis and 1 lymph nodes November 2020 CT 12/72/020-decrease size of right inguinal and external iliac lymph nodes, no evidence of disease progression Everolimus and letrozole 07/14/2019 - 04/06/2020 CT 10/20/2019-resolution of right iliac lymph node, decrease in size of right inguinal node CT 03/08/2020-no evidence of recurrent or metastatic disease in the abdomen or pelvis CT 05/31/2020-no evidence of recurrent or metastatic disease CT 03/11/2021-increase right inguinal lymph node, enlargement of bilateral inguinal lymph nodes, subcutaneous soft tissue nodule at the anterior right thigh Carboplatin 04/06/2021, 04/25/2021, 05/16/2021 CT 07/12/2021-mild decrease in size of bilateral inguinal lymphadenopathy, no progressive disease in the abdomen or pelvis, 0.6 cm left inguinal node was 0.8 cm previously Letrozole 08/06/2021 CT abdomen/pelvis 04/10/2022-stable right inguinal lymph node, no new site of metastatic disease CT right femur  04/10/2022 -2.2 x 1 cm anterior subcutaneous mass Letrozole continued CT right femur 07/19/2022-mild enlargement of right thigh subcutaneous mass  stable right inguinal nodal mass CTs 07/19/2022-stable right inguinal lymph node, no new or progressive disease  2.  Left lower extremity lymphedema  3.  Chronic  diarrhea controlled with Lomotil        Disposition: Dana [name] has metastatic uterine cancer.  She continues letrozole.  The right femur mass appears to be slowly enlarging.  She does not wish to consider changing treatment at present.  There is no other evidence of disease progression.  We discussed salvage treatment options including chemotherapy and immunotherapy.  She does not wish to undergo a restaging CT evaluation at present.  She has intermittent nausea/vomiting.  I doubt her symptoms are related to the uterine cancer.  She could have abdominal adhesions or another GI process causing her symptoms.  I recommended she follow-up with Dr. Virgina Jock and consider a GI referral.  She will return for an office visit in 2 months.  Betsy Coder, MD  09/20/2022  8:49 AM

## 2022-09-21 NOTE — Therapy (Signed)
OUTPATIENT PHYSICAL THERAPY ONCOLOGY EVALUATION  Patient Name: Dana [name] MRN: [id number] DOB:[birth year], 72 y.o., female Today's Date: 09/25/2022  END OF SESSION:  PT End of Session - 09/25/22 1259     Visit Number 1    Number of Visits 19    Date for PT Re-Evaluation 11/06/22    PT Start Time 1157    PT Stop Time 1240    PT Time Calculation (min) 43 min    Activity Tolerance Patient tolerated treatment well    Behavior During Therapy WFL for tasks assessed/performed             Past Medical History:  Diagnosis Date   Arthritis    GERD (gastroesophageal reflux disease)    History of blood transfusion    Hypothyroidism    Lymphedema    left leg- ted hose makes it worse   Thyroid disease    Uterine cancer ([place])    Endometrial cancer - surgery and chemo   Past Surgical History:  Procedure Laterality Date   ABDOMINAL HYSTERECTOMY     APPENDECTOMY     COLONOSCOPY W/ POLYPECTOMY     EYE SURGERY Bilateral    cataracts   IR IMAGING GUIDED PORT INSERTION  05/15/2019   IR REMOVAL TUN ACCESS W/ PORT W/O FL MOD SED  06/23/2020   LIVER BIOPSY     mass removed     2015 from pelvis   TOTAL HIP ARTHROPLASTY Right 07/13/2016   Procedure: RIGHT TOTAL HIP ARTHROPLASTY ANTERIOR APPROACH;  Surgeon: Leandrew Koyanagi, MD;  Location: [facility];  Service: Orthopedics;  Laterality: Right;   Patient Active Problem List   Diagnosis Date Noted   Pancytopenia, acquired ([place]) 05/16/2021   Superficial thrombophlebitis 05/16/2021   Cellulitis of left leg 03/24/2021   Cancer associated pain 03/03/2021   Immunocompromised patient ([place]) 04/28/2020   Preventive measure 03/09/2020   Leukopenia due to antineoplastic chemotherapy ([place]) 03/01/2020   Abdominal pain 10/31/2019   Peripheral neuropathy due to chemotherapy ([place]) 09/09/2019   Seborrheic keratoses, inflamed 08/15/2019   Other fatigue 07/23/2019   Physical debility 06/10/2019   Nausea with vomiting 05/23/2019   Goals of care,  counseling/discussion 05/12/2019   Lymphedema    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[place]) 04/21/2019   Hip joint replacement status 07/13/2016   Primary osteoarthritis of right hip 06/01/2016   Endometrioid adenocarcinoma of uterus ([place]) 06/03/2015   Diarrhea 06/03/2015    PCP: Shon Baton, MD  REFERRING PROVIDER: Ladell Pier, MD  REFERRING DIAG: C55 (ICD-10-CM) - Endometrioid adenocarcinoma of uterus ([place]) I89.0 (ICD-10-CM) - Lymphedema  THERAPY DIAG:  Lymphedema, not elsewhere classified  Endometrioid adenocarcinoma of uterus ([place])  ONSET DATE: 01/05/14  Rationale for Evaluation and Treatment: Rehabilitation  SUBJECTIVE:  SUBJECTIVE STATEMENT: I had the garment but I can not wear it anymore because it only comes up to my knee and now my swelling goes all the way up. My leg blew up all of a sudden. I had been using the compression pump. I have been exercising a lot lately but I have not been wearing the compression when I exercise.  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 Gross complete resection, tumor involve both ovaries, lymph nodes negative. 6 cycles of adjuvant Taxol/carboplatin. CT abdomen/pelvis 03/28/2019-enlarged right inguinal and iliac nodes. 04/10/2019-right inguinal lymph node biopsy-metastatic adenocarcinoma, ER 90%, PR 95%, PD-L1 combined positive score-1, MSS, tumor mutation burden-4. Cisplatin 05/16/2019 20-1 cycle. Radiation to the lateral pelvis and 1 lymph nodes November 2020. CT 12/72/020-decrease size of right inguinal and external iliac lymph nodes, no evidence of disease progression. Everolimus  and letrozole 07/14/2019 - 04/06/2020. CT 10/20/2019-resolution of right iliac lymph node, decrease in size of right inguinal node. CT 03/08/2020-no evidence of recurrent or metastatic disease in the abdomen or pelvis. CT 05/31/2020-no evidence of recurrent or metastatic disease. CT 03/11/2021-increase right inguinal lymph node, enlargement of bilateral inguinal lymph nodes, subcutaneous soft tissue nodule at the anterior right thigh. Carboplatin 04/06/2021, 04/25/2021, 05/16/2021. CT 07/12/2021-mild decrease in size of bilateral inguinal lymphadenopathy, no progressive disease in the abdomen or pelvis, 0.6 cm left inguinal node was 0.8 cm previously. Letrozole 08/06/2021. CT abdomen/pelvis 04/10/2022-stable right inguinal lymph node, no new site of metastatic disease. CT right femur  04/10/2022 -2.2 x 1 cm anterior subcutaneous mass. Letrozole continued. CT right femur 07/19/2022-mild enlargement of right thigh subcutaneous mass stable right inguinal nodal mass. CTs 07/19/2022-stable right inguinal lymph node, no new or progressive disease. 07/13/16- THA, 01/05/14- total abdominal hysterectomy, neuropathy in bilateral feet    PAIN:  Are you having pain? No pt reports it aches at night  PRECAUTIONS: Other: R THA  WEIGHT BEARING RESTRICTIONS: No  FALLS:  Has patient fallen in last 6 months? No  LIVING ENVIRONMENT: Lives with: lives with their family and lives alone Lives in: House/apartment Stairs: No;  Has following equipment at home: None  OCCUPATION: retired  Psychologist, clinical: goes to gym daily and alternates between treadmill/recumbent bike for 20 min, and does weight machines  HAND DOMINANCE: right   PRIOR LEVEL OF FUNCTION: Independent  PATIENT GOALS: to be able to get some of the fluid out   OBJECTIVE:  COGNITION: Overall cognitive status: Within functional limits for tasks assessed   PALPATION: Fibrosis throughout entire LLE  OBSERVATIONS / OTHER ASSESSMENTS: LLE much larger than R with peau d  orange texture and increased fibrosis   LYMPHEDEMA ASSESSMENTS:   SURGERY TYPE/DATE: Total abdominal hysterectomy: Gross complete resection, tumor involved both ovaries, lymph nodes negative 01/05/14  NUMBER OF LYMPH NODES REMOVED: 21 nodes - pt unsure how many were bad  CHEMOTHERAPY: completed  RADIATION: completed  INFECTIONS: 1 infection a few years ago  LYMPHEDEMA ASSESSMENTS:     LOWER EXTREMITY LANDMARK RIGHT eval  At groin   30 cm proximal to suprapatella   20 cm proximal to suprapatella 57  10 cm proximal to suprapatella 49  At midpatella / popliteal crease 38.6  30 cm proximal to floor at lateral plantar foot 38.5  20 cm proximal to floor at lateral plantar foot 29.9  10 cm proximal to floor at lateral plantar foot 21.3  Circumference  of ankle/heel   5 cm proximal to 1st MTP joint 21.1  Across MTP joint 21.7  Around proximal great toe 7.4  (Blank rows = not tested)  LOWER EXTREMITY LANDMARK 07/01/2019 LEFT eval  At groin    30 cm proximal to suprapatella    20 cm proximal to suprapatella 58 64  10 cm proximal to suprapatella 49.9 57.7  At midpatella / popliteal crease 37.8 48  30 cm proximal to floor at lateral plantar foot 39.3 51.1  20 cm proximal to floor at lateral plantar foot 31.4 44.8  10 cm proximal to floor at lateral plantar foot 22 31  Circumference of ankle/heel    5 cm proximal to 1st MTP joint 21 22.4  Across MTP joint 21.8 23  Around proximal great toe 7.2 7.7  (Blank rows = not tested)  LLIS: 60.29    TODAY'S TREATMENT:                                                                                                                                         DATE:  09/25/22: unable to bandage today because pt does not have proper footwear - will bring at next session  PATIENT EDUCATION:  Education details: importance of compression during exercise, how compression bandages work with muscle to move fluid Person educated: Patient Education  method: Explanation Education comprehension: verbalized understanding  HOME EXERCISE PROGRAM: Continue compression pump  ASSESSMENT:  CLINICAL IMPRESSION: Patient is a 72 y.o. female who was seen today for physical therapy evaluation and treatment for L LE lymphedema. Pt had endometrioid adenocarcinoma and underwent a total hysterectomy, appendectomy, pelvic and periaortic lymphadenectomy and wedge resection of her liver 01/05/14. She was previously seen at this clinic for LLE lymphedema in 2020 and her L LE edema only was to her knee. She now has full LLE edema that extends past the groin and into her lower abdomen. She reports she exercises daily but does not wear compression while she exercises. Her old compression garment from 2020 no longer fits. Her leg is 12 cm larger than it was in 2020 in some areas. There is fibrosis present throughout. She would benefit from skilled PT services to decrease LLE lymphedema and assist pt with obtaining a new compression garment.   OBJECTIVE IMPAIRMENTS: decreased knowledge of condition, difficulty walking, increased edema, and pain.   ACTIVITY LIMITATIONS: standing and sleeping  PARTICIPATION LIMITATIONS:  none  PERSONAL FACTORS: Time since onset of injury/illness/exacerbation are also affecting patient's functional outcome.   REHAB POTENTIAL: Good  CLINICAL DECISION MAKING: Stable/uncomplicated  EVALUATION COMPLEXITY: Low  GOALS: Goals reviewed with patient? Yes  SHORT TERM GOALS: Target date: 10/16/22  Pt will demonstrate a 3 cm decrease in edema at popliteal crease to decrease knee discomfort.  Baseline: Goal status: INITIAL  2.  Pt will demonstrate a 3 cm decrease in edema at 30 cm superior to the floor  to decrease risk of infection.  Baseline:  Goal status: INITIAL    LONG TERM GOALS: Target date: 11/06/22  Pt will obtain appropriate compression garment for long term management of edema.  Baseline:  Goal status: INITIAL  2.  Pt  will demonstrate a 50% improvement in fibrosis to decrease risk of infection.  Baseline:  Goal status: INITIAL  3.  Pt will report she is able to sleep at night without increased discomfort from her L LE.  Baseline:  Goal status: INITIAL  4.  Pt will be able to independently manage her lymphedema through a compression garment and using the compression pump.  Baseline:  Goal status: INITIAL  5.  Pt will demonstrate a 9 cm reduction in edema at popliteal crease to decrease risk of infection.  Baseline:  Goal status: INITIAL    PLAN:  PT FREQUENCY: 3x/week  PT DURATION: 6 weeks  PLANNED INTERVENTIONS: Therapeutic exercises, Therapeutic activity, Patient/Family education, Self Care, Orthotic/Fit training, Manual lymph drainage, Compression bandaging, Vasopneumatic device, Manual therapy, and Re-evaluation  PLAN FOR NEXT SESSION: begin CDT to LLE, assess pt's old garment   [employer], PT 09/25/2022, 1:07 PM

## 2022-09-25 ENCOUNTER — Ambulatory Visit: Payer: Medicare HMO | Attending: Oncology | Admitting: Physical Therapy

## 2022-09-25 ENCOUNTER — Encounter: Payer: Self-pay | Admitting: Physical Therapy

## 2022-09-25 ENCOUNTER — Other Ambulatory Visit: Payer: Self-pay

## 2022-09-25 DIAGNOSIS — R262 Difficulty in walking, not elsewhere classified: Secondary | ICD-10-CM | POA: Diagnosis not present

## 2022-09-25 DIAGNOSIS — I890 Lymphedema, not elsewhere classified: Secondary | ICD-10-CM | POA: Diagnosis not present

## 2022-09-25 DIAGNOSIS — C55 Malignant neoplasm of uterus, part unspecified: Secondary | ICD-10-CM | POA: Diagnosis not present

## 2022-09-27 ENCOUNTER — Ambulatory Visit: Payer: Medicare HMO | Admitting: Rehabilitation

## 2022-09-27 ENCOUNTER — Encounter: Payer: Self-pay | Admitting: Rehabilitation

## 2022-09-27 DIAGNOSIS — I890 Lymphedema, not elsewhere classified: Secondary | ICD-10-CM

## 2022-09-27 DIAGNOSIS — C55 Malignant neoplasm of uterus, part unspecified: Secondary | ICD-10-CM

## 2022-09-27 DIAGNOSIS — R262 Difficulty in walking, not elsewhere classified: Secondary | ICD-10-CM | POA: Diagnosis not present

## 2022-09-27 NOTE — Therapy (Signed)
OUTPATIENT PHYSICAL THERAPY ONCOLOGY TREATMENT  Patient Name: Dana [name] MRN: [id number] DOB:January 28, 1951, 72 y.o., female Today's Date: 09/27/2022  END OF SESSION:  PT End of Session - 09/27/22 1502     Visit Number 2    Number of Visits 19    Date for PT Re-Evaluation 11/06/22    PT Start Time 1400    PT Stop Time [id number]    PT Time Calculation (min) 47 min    Activity Tolerance Patient tolerated treatment well    Behavior During Therapy WFL for tasks assessed/performed              Past Medical History:  Diagnosis Date   Arthritis    GERD (gastroesophageal reflux disease)    History of blood transfusion    Hypothyroidism    Lymphedema    left leg- ted hose makes it worse   Thyroid disease    Uterine cancer ([place])    Endometrial cancer - surgery and chemo   Past Surgical History:  Procedure Laterality Date   ABDOMINAL HYSTERECTOMY     APPENDECTOMY     COLONOSCOPY W/ POLYPECTOMY     EYE SURGERY Bilateral    cataracts   IR IMAGING GUIDED PORT INSERTION  05/15/2019   IR REMOVAL TUN ACCESS W/ PORT W/O FL MOD SED  06/23/2020   LIVER BIOPSY     mass removed     2015 from pelvis   TOTAL HIP ARTHROPLASTY Right 07/13/2016   Procedure: RIGHT TOTAL HIP ARTHROPLASTY ANTERIOR APPROACH;  Surgeon: Leandrew Koyanagi, MD;  Location: [facility];  Service: Orthopedics;  Laterality: Right;   Patient Active Problem List   Diagnosis Date Noted   Pancytopenia, acquired ([place]) 05/16/2021   Superficial thrombophlebitis 05/16/2021   Cellulitis of left leg 03/24/2021   Cancer associated pain 03/03/2021   Immunocompromised patient ([place]) 04/28/2020   Preventive measure 03/09/2020   Leukopenia due to antineoplastic chemotherapy ([place]) 03/01/2020   Abdominal pain 10/31/2019   Peripheral neuropathy due to chemotherapy ([place]) 09/09/2019   Seborrheic keratoses, inflamed 08/15/2019   Other fatigue 07/23/2019   Physical debility 06/10/2019   Nausea with vomiting 05/23/2019   Goals of care,  counseling/discussion 05/12/2019   Lymphedema    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[place]) 04/21/2019   Hip joint replacement status 07/13/2016   Primary osteoarthritis of right hip 06/01/2016   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  I got some biker shorts but not compression shorts.    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 Gross complete resection, tumor involve both ovaries, lymph nodes negative. 6 cycles of adjuvant Taxol/carboplatin. CT abdomen/pelvis 03/28/2019-enlarged right inguinal and iliac nodes. 04/10/2019-right inguinal lymph node biopsy-metastatic adenocarcinoma, ER 90%, PR 95%, PD-L1 combined positive score-1, MSS, tumor mutation burden-4. Cisplatin 05/16/2019 20-1 cycle. Radiation to the lateral pelvis and 1 lymph nodes November 2020. CT 12/72/020-decrease size of right inguinal and external iliac lymph nodes, no evidence of disease progression. Everolimus and letrozole 07/14/2019 - 04/06/2020. CT 10/20/2019-resolution of right iliac lymph node, decrease in size of right inguinal node. CT 03/08/2020-no evidence of recurrent or metastatic disease in the abdomen or  pelvis. CT 05/31/2020-no evidence of recurrent or metastatic disease. CT 03/11/2021-increase right inguinal lymph node, enlargement of bilateral inguinal lymph nodes, subcutaneous soft tissue nodule at the anterior right thigh. Carboplatin 04/06/2021, 04/25/2021, 05/16/2021. CT 07/12/2021-mild decrease in size of bilateral inguinal lymphadenopathy, no progressive disease in the abdomen or pelvis, 0.6 cm left inguinal node was 0.8 cm previously. Letrozole 08/06/2021. CT abdomen/pelvis 04/10/2022-stable right inguinal lymph node, no new site of metastatic disease. CT right femur  04/10/2022 -2.2 x 1 cm anterior subcutaneous mass. Letrozole continued. CT right femur 07/19/2022-mild enlargement of right thigh subcutaneous mass stable right inguinal nodal mass. CTs 07/19/2022-stable right inguinal lymph node, no new or progressive disease. 07/13/16- THA, 01/05/14- total abdominal hysterectomy, neuropathy in bilateral feet    PAIN:  Are you having pain? No pt reports it aches at night  PRECAUTIONS: Other: R THA  WEIGHT BEARING RESTRICTIONS: No  FALLS:  Has patient fallen in last 6 months? No  LIVING ENVIRONMENT: Lives with: lives with their family and lives alone Lives in: House/apartment Stairs: No;  Has following equipment at home: None  OCCUPATION: retired  Psychologist, clinical: goes to gym daily and alternates between treadmill/recumbent bike for 20 min, and does weight machines  PATIENT GOALS: to be able to get some of the fluid out   OBJECTIVE:  LYMPHEDEMA ASSESSMENTS:  SURGERY TYPE/DATE: Total abdominal hysterectomy: Gross complete resection, tumor involved both ovaries, lymph nodes negative 01/05/14 NUMBER OF LYMPH NODES REMOVED: 21 nodes - pt unsure how many were bad CHEMOTHERAPY: completed RADIATION: completed INFECTIONS: 1 infection a few years ago LYMPHEDEMA ASSESSMENTS:   LOWER EXTREMITY LANDMARK RIGHT eval  At groin   30 cm proximal to suprapatella   20 cm proximal to suprapatella 57  10 cm  proximal to suprapatella 49  At midpatella / popliteal crease 38.6  30 cm proximal to floor at lateral plantar foot 38.5  20 cm proximal to floor at lateral plantar foot 29.9  10 cm proximal to floor at lateral plantar foot 21.3  Circumference of ankle/heel   5 cm proximal to 1st MTP joint 21.1  Across MTP joint 21.7  Around proximal great toe 7.4  (Blank rows = not tested)  LOWER EXTREMITY LANDMARK 07/01/2019 LEFT eval  At groin    30 cm proximal to suprapatella    20 cm proximal to suprapatella 58 64  10 cm proximal to suprapatella 49.9 57.7  At midpatella / popliteal crease 37.8 48  30 cm proximal to floor at lateral plantar foot 39.3 51.1  20 cm proximal to floor at lateral plantar foot 31.4 44.8  10  cm proximal to floor at lateral plantar foot 22 31  Circumference of ankle/heel    5 cm proximal to 1st MTP joint 21 22.4  Across MTP joint 21.8 23  Around proximal great toe 7.2 7.7  (Blank rows = not tested)  LLIS: 60.29  TODAY'S TREATMENT:                                                                                                                                         DATE:  09/27/22: Bandaging set up today got all supplies ready and cut kidneys for malleoli but did not end up using them. : Tg soft liner Lg foot to thigh, toes 1-4, rosidal soft foam foot rectangle held on and shaped with artiflex and then rosidal soft from ankle to thigh.  8cm roman sandal, 10cm ASH and up lower leg, 10cm into thigh and then 12cm from foot to ankle.   Applied cast shoe and reiterated safety and wear of compression.   09/25/22: unable to bandage today because pt does not have proper footwear - will bring at next session  PATIENT EDUCATION:  Education details: importance of compression during exercise, how compression bandages work with muscle to move fluid Person educated: Patient Education method: Explanation Education comprehension: verbalized understanding  HOME EXERCISE  PROGRAM: Continue compression pump  ASSESSMENT:  CLINICAL IMPRESSION: Focused on getting bandaging situated today.  We started with rosidal soft foam which she had not done previously.  Pt reports bandage feels comfortable upon leaving.   OBJECTIVE IMPAIRMENTS: decreased knowledge of condition, difficulty walking, increased edema, and pain.   GOALS: Goals reviewed with patient? Yes  SHORT TERM GOALS: Target date: 10/16/22  Pt will demonstrate a 3 cm decrease in edema at popliteal crease to decrease knee discomfort.  Baseline: Goal status: INITIAL  2.  Pt will demonstrate a 3 cm decrease in edema at 30 cm superior to the floor to decrease risk of infection.  Baseline:  Goal status: INITIAL    LONG TERM GOALS: Target date: 11/06/22  Pt will obtain appropriate compression garment for long term management of edema.  Baseline:  Goal status: INITIAL  2.  Pt will demonstrate a 50% improvement in fibrosis to decrease risk of infection.  Baseline:  Goal status: INITIAL  3.  Pt will report she is able to sleep at night without increased discomfort from her L LE.  Baseline:  Goal status: INITIAL  4.  Pt will be able to independently manage her lymphedema through a compression garment and using the compression pump.  Baseline:  Goal status: INITIAL  5.  Pt will demonstrate a 9 cm reduction in edema at popliteal crease to decrease risk of infection.  Baseline:  Goal status: INITIAL    PLAN:  PT FREQUENCY: 3x/week  PT DURATION: 6 weeks  PLANNED INTERVENTIONS: Therapeutic exercises, Therapeutic activity, Patient/Family education, Self Care, Orthotic/Fit training, Manual lymph drainage,  Compression bandaging, Vasopneumatic device, Manual therapy, and Re-evaluation  PLAN FOR NEXT SESSION: begin CDT to LLE, assess pt's old garment   Stark Bray, PT 09/27/2022, 3:02 PM

## 2022-09-29 ENCOUNTER — Ambulatory Visit: Payer: Medicare HMO | Admitting: Physical Therapy

## 2022-09-29 DIAGNOSIS — R262 Difficulty in walking, not elsewhere classified: Secondary | ICD-10-CM | POA: Diagnosis not present

## 2022-09-29 DIAGNOSIS — I890 Lymphedema, not elsewhere classified: Secondary | ICD-10-CM

## 2022-09-29 DIAGNOSIS — C55 Malignant neoplasm of uterus, part unspecified: Secondary | ICD-10-CM | POA: Diagnosis not present

## 2022-09-29 NOTE — Therapy (Signed)
OUTPATIENT PHYSICAL THERAPY ONCOLOGY TREATMENT  Patient Name: Dana [name] MRN: [id number] DOB:February 13, 1951, 72 y.o., female Today's Date: 09/29/2022  END OF SESSION:  PT End of Session - 09/29/22 1112     Visit Number 3    Number of Visits 19    Date for PT Re-Evaluation 11/06/22    PT Start Time 1003    PT Stop Time 1108    PT Time Calculation (min) 65 min    Activity Tolerance Patient tolerated treatment well    Behavior During Therapy WFL for tasks assessed/performed              Past Medical History:  Diagnosis Date   Arthritis    GERD (gastroesophageal reflux disease)    History of blood transfusion    Hypothyroidism    Lymphedema    left leg- ted hose makes it worse   Thyroid disease    Uterine cancer ([place])    Endometrial cancer - surgery and chemo   Past Surgical History:  Procedure Laterality Date   ABDOMINAL HYSTERECTOMY     APPENDECTOMY     COLONOSCOPY W/ POLYPECTOMY     EYE SURGERY Bilateral    cataracts   IR IMAGING GUIDED PORT INSERTION  05/15/2019   IR REMOVAL TUN ACCESS W/ PORT W/O FL MOD SED  06/23/2020   LIVER BIOPSY     mass removed     2015 from pelvis   TOTAL HIP ARTHROPLASTY Right 07/13/2016   Procedure: RIGHT TOTAL HIP ARTHROPLASTY ANTERIOR APPROACH;  Surgeon: Leandrew Koyanagi, MD;  Location: [facility];  Service: Orthopedics;  Laterality: Right;   Patient Active Problem List   Diagnosis Date Noted   Pancytopenia, acquired ([place]) 05/16/2021   Superficial thrombophlebitis 05/16/2021   Cellulitis of left leg 03/24/2021   Cancer associated pain 03/03/2021   Immunocompromised patient ([place]) 04/28/2020   Preventive measure 03/09/2020   Leukopenia due to antineoplastic chemotherapy ([place]) 03/01/2020   Abdominal pain 10/31/2019   Peripheral neuropathy due to chemotherapy ([place]) 09/09/2019   Seborrheic keratoses, inflamed 08/15/2019   Other fatigue 07/23/2019   Physical debility 06/10/2019   Nausea with vomiting 05/23/2019   Goals of care,  counseling/discussion 05/12/2019   Lymphedema    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[place]) 04/21/2019   Hip joint replacement status 07/13/2016   Primary osteoarthritis of right hip 06/01/2016   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 Gross complete resection, tumor involve both ovaries, lymph nodes negative. 6 cycles of adjuvant Taxol/carboplatin. CT abdomen/pelvis 03/28/2019-enlarged right inguinal and iliac nodes. 04/10/2019-right inguinal lymph node biopsy-metastatic adenocarcinoma, ER 90%, PR 95%, PD-L1 combined positive score-1, MSS, tumor mutation burden-4. Cisplatin 05/16/2019 20-1 cycle. Radiation to the lateral pelvis and 1 lymph nodes November 2020. CT 12/72/020-decrease size of right inguinal and external iliac lymph nodes, no evidence of disease progression. Everolimus and letrozole 07/14/2019 - 04/06/2020. CT 10/20/2019-resolution of right iliac lymph node, decrease in size of right inguinal node. CT 03/08/2020-no evidence of recurrent or metastatic disease in the abdomen or pelvis. CT 05/31/2020-no evidence of recurrent  or metastatic disease. CT 03/11/2021-increase right inguinal lymph node, enlargement of bilateral inguinal lymph nodes, subcutaneous soft tissue nodule at the anterior right thigh. Carboplatin 04/06/2021, 04/25/2021, 05/16/2021. CT 07/12/2021-mild decrease in size of bilateral inguinal lymphadenopathy, no progressive disease in the abdomen or pelvis, 0.6 cm left inguinal node was 0.8 cm previously. Letrozole 08/06/2021. CT abdomen/pelvis 04/10/2022-stable right inguinal lymph node, no new site of metastatic disease. CT right femur  04/10/2022 -2.2 x 1 cm anterior subcutaneous mass. Letrozole continued. CT right femur 07/19/2022-mild enlargement of right thigh subcutaneous mass stable right inguinal nodal mass. CTs 07/19/2022-stable right inguinal lymph node, no new or progressive disease. 07/13/16- THA, 01/05/14- total abdominal hysterectomy, neuropathy in bilateral feet    PAIN:  Are you having pain? No pt reports it aches at night  PRECAUTIONS: Other: R THA  WEIGHT BEARING RESTRICTIONS: No  FALLS:  Has patient fallen in last 6 months? No  LIVING ENVIRONMENT: Lives with: lives with their family and lives alone Lives in: House/apartment Stairs: No;  Has following equipment at home: None  OCCUPATION: retired  Psychologist, clinical: goes to gym daily and alternates between treadmill/recumbent bike for 20 min, and does weight machines  PATIENT GOALS: to be able to get some of the fluid out   OBJECTIVE:  LYMPHEDEMA ASSESSMENTS:  SURGERY TYPE/DATE: Total abdominal hysterectomy: Gross complete resection, tumor involved both ovaries, lymph nodes negative 01/05/14 NUMBER OF LYMPH NODES REMOVED: 21 nodes - pt unsure how many were bad CHEMOTHERAPY: completed RADIATION: completed INFECTIONS: 1 infection a few years ago LYMPHEDEMA ASSESSMENTS:   LOWER EXTREMITY LANDMARK RIGHT eval  At groin   30 cm proximal to suprapatella   20 cm proximal to suprapatella 57  10 cm proximal to suprapatella 49  At midpatella /  popliteal crease 38.6  30 cm proximal to floor at lateral plantar foot 38.5  20 cm proximal to floor at lateral plantar foot 29.9  10 cm proximal to floor at lateral plantar foot 21.3  Circumference of ankle/heel   5 cm proximal to 1st MTP joint 21.1  Across MTP joint 21.7  Around proximal great toe 7.4  (Blank rows = not tested)  LOWER EXTREMITY LANDMARK 07/01/2019 LEFT eval  At groin    30 cm proximal to suprapatella    20 cm proximal to suprapatella 58 64  10 cm proximal to suprapatella 49.9 57.7  At midpatella / popliteal crease 37.8 48  30 cm proximal to floor at lateral plantar foot 39.3 51.1  20 cm proximal to floor at lateral plantar foot 31.4 44.8  10 cm proximal to floor at lateral plantar foot 22 31  Circumference of ankle/heel    5 cm proximal to 1st MTP joint 21 22.4  Across MTP joint 21.8 23  Around proximal great toe 7.2 7.7  (Blank rows = not tested)  LLIS: 60.29  TODAY'S TREATMENT:                                                                                                                                         DATE:  09/29/22:  In supine with left leg elevated. Short neck, superficial and deep abdominals, left axillary nodes with left inguino-axillary anastomosis, right inguinal nodes with anterior inter inguinal anastomosis. Extra stationary circles with direction toward center on abdomen the manual lymph drainage of left thigh, leg foot and ankle with return along pathways and extra time on firm areas of lower leg. Coco butter to dry skin of lower leg then bandaging with Tg soft liner Lg foot to thigh,  rosidal soft foam foot rectangle held on and shaped with artiflex and then rosidal soft from ankle to thigh.  8cm roman sandal, 10cm ASH and up lower leg, 10cm into thigh and then 12cm from foot to ankle.  Did not badage toes today.  Reinforced remedial exercises and standing heel lifts along with elevation of leg at home    09/27/22: Bandaging set up today got  all supplies ready and cut kidneys for malleoli but did not end up using them. : Tg soft liner Lg foot to thigh, toes 1-4, rosidal soft foam foot rectangle held on and shaped with artiflex and then rosidal soft from ankle to thigh.  8cm roman sandal, 10cm ASH and up lower leg, 10cm into thigh and then 12cm from foot to ankle.   Applied cast shoe and reiterated safety and wear of compression.   09/25/22: unable to bandage today because pt does not have proper footwear - will bring at next session  PATIENT EDUCATION:  Education details: importance of compression during exercise, how compression bandages work with muscle to move fluid Person educated: Patient Education method: Explanation Education comprehension: verbalized understanding  HOME EXERCISE PROGRAM: Continue compression pump  ASSESSMENT:  CLINICAL IMPRESSION: Focused on getting bandaging situated today.  We started with rosidal soft foam which she had not done previously.  Pt reports bandage feels comfortable upon leaving.   OBJECTIVE IMPAIRMENTS: decreased knowledge of condition, difficulty walking, increased edema, and pain.   GOALS: Goals reviewed with patient? Yes  SHORT TERM GOALS: Target date: 10/16/22  Pt will demonstrate a 3 cm decrease in edema at popliteal crease to decrease knee discomfort.  Baseline: Goal status: INITIAL  2.  Pt will demonstrate a 3 cm decrease in edema at 30 cm superior to the floor to decrease risk of infection.  Baseline:  Goal status: INITIAL    LONG TERM GOALS: Target date: 11/06/22  Pt will obtain appropriate compression garment for long term management of edema.  Baseline:  Goal status: INITIAL  2.  Pt will demonstrate a 50% improvement in fibrosis to decrease risk of infection.  Baseline:  Goal status: INITIAL  3.  Pt will report she is able to sleep at night without increased discomfort from her L LE.  Baseline:  Goal status: INITIAL  4.  Pt will be able to independently  manage her lymphedema through a compression garment and using the compression pump.  Baseline:  Goal status: INITIAL  5.  Pt will demonstrate a 9 cm reduction in edema at popliteal crease to decrease risk of infection.  Baseline:  Goal status: INITIAL    PLAN:  PT FREQUENCY: 3x/week  PT DURATION: 6 weeks  PLANNED INTERVENTIONS: Therapeutic exercises, Therapeutic activity, Patient/Family education, Self Care, Orthotic/Fit training, Manual lymph drainage, Compression bandaging, Vasopneumatic device, Manual therapy, and Re-evaluation  PLAN FOR NEXT SESSION: continue  CDT to LLE, reminder to pt to come in with bandages on in midweek session for measurement assess pt's old garment   Norwood Levo, PT 09/29/2022, 11:14 AM [address], [place], [address] Phone: [phone]   Fax:  [phone]  Patient Details  Name: Dana [name] MRN: [id number] Date of Birth: 1950-11-27 Referring Provider:  Ladell Pier, MD  Encounter Date: 09/29/2022   Norwood Levo, PT 09/29/2022, [facility] [address] [place], [place], [address] Phone: [phone]   Fax:  [phone]

## 2022-10-02 ENCOUNTER — Encounter: Payer: Self-pay | Admitting: Physical Therapy

## 2022-10-02 ENCOUNTER — Ambulatory Visit: Payer: Medicare HMO | Admitting: Physical Therapy

## 2022-10-02 DIAGNOSIS — I890 Lymphedema, not elsewhere classified: Secondary | ICD-10-CM

## 2022-10-02 DIAGNOSIS — C55 Malignant neoplasm of uterus, part unspecified: Secondary | ICD-10-CM | POA: Diagnosis not present

## 2022-10-02 DIAGNOSIS — R262 Difficulty in walking, not elsewhere classified: Secondary | ICD-10-CM | POA: Diagnosis not present

## 2022-10-02 NOTE — Therapy (Signed)
OUTPATIENT PHYSICAL THERAPY ONCOLOGY TREATMENT  Patient Name: Dana [name] MRN: [id number] DOB:01/14/1951, 72 y.o., female Today's Date: 10/02/2022  END OF SESSION:  PT End of Session - 10/02/22 1006     Visit Number 4    Number of Visits 19    Date for PT Re-Evaluation 11/06/22    PT Start Time 1004    PT Stop Time 1102    PT Time Calculation (min) 58 min    Activity Tolerance Patient tolerated treatment well    Behavior During Therapy WFL for tasks assessed/performed              Past Medical History:  Diagnosis Date   Arthritis    GERD (gastroesophageal reflux disease)    History of blood transfusion    Hypothyroidism    Lymphedema    left leg- ted hose makes it worse   Thyroid disease    Uterine cancer ([place])    Endometrial cancer - surgery and chemo   Past Surgical History:  Procedure Laterality Date   ABDOMINAL HYSTERECTOMY     APPENDECTOMY     COLONOSCOPY W/ POLYPECTOMY     EYE SURGERY Bilateral    cataracts   IR IMAGING GUIDED PORT INSERTION  05/15/2019   IR REMOVAL TUN ACCESS W/ PORT W/O FL MOD SED  06/23/2020   LIVER BIOPSY     mass removed     2015 from pelvis   TOTAL HIP ARTHROPLASTY Right 07/13/2016   Procedure: RIGHT TOTAL HIP ARTHROPLASTY ANTERIOR APPROACH;  Surgeon: Leandrew Koyanagi, MD;  Location: [facility];  Service: Orthopedics;  Laterality: Right;   Patient Active Problem List   Diagnosis Date Noted   Pancytopenia, acquired ([place]) 05/16/2021   Superficial thrombophlebitis 05/16/2021   Cellulitis of left leg 03/24/2021   Cancer associated pain 03/03/2021   Immunocompromised patient ([place]) 04/28/2020   Preventive measure 03/09/2020   Leukopenia due to antineoplastic chemotherapy ([place]) 03/01/2020   Abdominal pain 10/31/2019   Peripheral neuropathy due to chemotherapy ([place]) 09/09/2019   Seborrheic keratoses, inflamed 08/15/2019   Other fatigue 07/23/2019   Physical debility 06/10/2019   Nausea with vomiting 05/23/2019   Goals of care,  counseling/discussion 05/12/2019   Lymphedema    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[place]) 04/21/2019   Hip joint replacement status 07/13/2016   Primary osteoarthritis of right hip 06/01/2016   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 Gross complete resection, tumor involve both ovaries, lymph nodes negative. 6 cycles of adjuvant Taxol/carboplatin. CT abdomen/pelvis 03/28/2019-enlarged right inguinal and iliac nodes. 04/10/2019-right inguinal lymph node biopsy-metastatic adenocarcinoma, ER 90%, PR 95%, PD-L1 combined positive score-1, MSS, tumor mutation burden-4. Cisplatin 05/16/2019 20-1 cycle. Radiation to the lateral pelvis and 1 lymph nodes November 2020. CT 12/72/020-decrease size of right inguinal and external iliac lymph nodes, no evidence of disease progression. Everolimus and letrozole 07/14/2019 - 04/06/2020. CT 10/20/2019-resolution of right iliac lymph node, decrease in size of right inguinal node. CT 03/08/2020-no evidence of recurrent or metastatic disease in the abdomen or pelvis. CT 05/31/2020-no evidence of recurrent  or metastatic disease. CT 03/11/2021-increase right inguinal lymph node, enlargement of bilateral inguinal lymph nodes, subcutaneous soft tissue nodule at the anterior right thigh. Carboplatin 04/06/2021, 04/25/2021, 05/16/2021. CT 07/12/2021-mild decrease in size of bilateral inguinal lymphadenopathy, no progressive disease in the abdomen or pelvis, 0.6 cm left inguinal node was 0.8 cm previously. Letrozole 08/06/2021. CT abdomen/pelvis 04/10/2022-stable right inguinal lymph node, no new site of metastatic disease. CT right femur  04/10/2022 -2.2 x 1 cm anterior subcutaneous mass. Letrozole continued. CT right femur 07/19/2022-mild enlargement of right thigh subcutaneous mass stable right inguinal nodal mass. CTs 07/19/2022-stable right inguinal lymph node, no new or progressive disease. 07/13/16- THA, 01/05/14- total abdominal hysterectomy, neuropathy in bilateral feet    PAIN:  Are you having pain? No pt reports it aches at night  PRECAUTIONS: Other: R THA  WEIGHT BEARING RESTRICTIONS: No  FALLS:  Has patient fallen in last 6 months? No  LIVING ENVIRONMENT: Lives with: lives with their family and lives alone Lives in: House/apartment Stairs: No;  Has following equipment at home: None  OCCUPATION: retired  Psychologist, clinical: goes to gym daily and alternates between treadmill/recumbent bike for 20 min, and does weight machines  PATIENT GOALS: to be able to get some of the fluid out   OBJECTIVE:  LYMPHEDEMA ASSESSMENTS:  SURGERY TYPE/DATE: Total abdominal hysterectomy: Gross complete resection, tumor involved both ovaries, lymph nodes negative 01/05/14 NUMBER OF LYMPH NODES REMOVED: 21 nodes - pt unsure how many were bad CHEMOTHERAPY: completed RADIATION: completed INFECTIONS: 1 infection a few years ago LYMPHEDEMA ASSESSMENTS:   LOWER EXTREMITY LANDMARK RIGHT eval  At groin   30 cm proximal to suprapatella   20 cm proximal to suprapatella 57  10 cm proximal to suprapatella 49  At midpatella /  popliteal crease 38.6  30 cm proximal to floor at lateral plantar foot 38.5  20 cm proximal to floor at lateral plantar foot 29.9  10 cm proximal to floor at lateral plantar foot 21.3  Circumference of ankle/heel   5 cm proximal to 1st MTP joint 21.1  Across MTP joint 21.7  Around proximal great toe 7.4  (Blank rows = not tested)  LOWER EXTREMITY LANDMARK 07/01/2019 LEFT eval  At groin    30 cm proximal to suprapatella    20 cm proximal to suprapatella 58 64  10 cm proximal to suprapatella 49.9 57.7  At midpatella / popliteal crease 37.8 48  30 cm proximal to floor at lateral plantar foot 39.3 51.1  20 cm proximal to floor at lateral plantar foot 31.4 44.8  10 cm proximal to floor at lateral plantar foot 22 31  Circumference of ankle/heel    5 cm proximal to 1st MTP joint 21 22.4  Across MTP joint 21.8 23  Around proximal great toe 7.2 7.7  (Blank rows = not tested)  LLIS: 60.29  TODAY'S TREATMENT:                                                                                                                                         DATE:  10/02/22: Manual lymph drainage in supine: short neck, 5 diaphragmatic breaths, left inguino-axillary anastamoses, left lower extremity from toes and dorsal foot to lateral hip redirecting along pathway. Bandaging: Lotion to entire L leg then bandaging with Tg soft liner size Lg foot to thigh,  rosidal soft foam foot rectangle held on and shaped with artiflex and then rosidal soft from ankle to thigh.  8cm at foot, 10 cm roman sandal, 10cm from ankle to knee, 12 cm from ankle to just above knee in herringbone, then 2 12 cm bandages from knee to groin Did not badage toes today as pt did not tolerate this well   09/29/22:  In supine with left leg elevated. Short neck, superficial and deep abdominals, left axillary nodes with left inguino-axillary anastomosis, right inguinal nodes with anterior inter inguinal anastomosis. Extra stationary circles with  direction toward center on abdomen the manual lymph drainage of left thigh, leg foot and ankle with return along pathways and extra time on firm areas of lower leg. Coco butter to dry skin of lower leg then bandaging with Tg soft liner Lg foot to thigh,  rosidal soft foam foot rectangle held on and shaped with artiflex and then rosidal soft from ankle to thigh.  8cm roman sandal, 10cm ASH and up lower leg, 10cm into thigh and then 12cm from foot to ankle.  Did not badage toes today.  Reinforced remedial exercises and standing heel lifts along with elevation of leg at home    09/27/22: Bandaging set up today got all supplies ready and cut kidneys for malleoli but did not end up using them. : Tg soft liner Lg foot to thigh, toes 1-4, rosidal soft foam foot rectangle held on and shaped with artiflex and then rosidal soft from ankle to thigh.  8cm roman sandal, 10cm ASH and up lower leg, 10cm into thigh and then 12cm from foot to ankle.   Applied cast shoe and reiterated safety and wear of compression.   09/25/22: unable to bandage today because pt does not have proper footwear - will bring at next session  PATIENT EDUCATION:  Education details: importance of compression during exercise, how compression bandages work with muscle to move fluid Person educated: Patient Education method: Explanation Education comprehension: verbalized understanding  HOME EXERCISE PROGRAM: Continue compression pump  ASSESSMENT:  CLINICAL IMPRESSION: Continued with MLD followed by compression bandaging today. Added two additional 12 cm short stretch bandages for additional compression given the fibrosis  present throughout LLE.   OBJECTIVE IMPAIRMENTS: decreased knowledge of condition, difficulty walking, increased edema, and pain.   GOALS: Goals reviewed with patient? Yes  SHORT TERM GOALS: Target date: 10/16/22  Pt will demonstrate a 3 cm decrease in edema at popliteal crease to decrease knee discomfort.   Baseline: Goal status: INITIAL  2.  Pt will demonstrate a 3 cm decrease in edema at 30 cm superior to the floor to decrease risk of infection.  Baseline:  Goal status: INITIAL    LONG TERM GOALS: Target date: 11/06/22  Pt will obtain appropriate compression garment for long term management of edema.  Baseline:  Goal status: INITIAL  2.  Pt will demonstrate a 50% improvement in fibrosis to decrease risk of infection.  Baseline:  Goal status: INITIAL  3.  Pt will report she is able to sleep at night without increased discomfort from her L LE.  Baseline:  Goal status: INITIAL  4.  Pt will be able to independently manage her lymphedema through a compression garment and using the compression pump.  Baseline:  Goal status: INITIAL  5.  Pt will demonstrate a 9 cm reduction in edema at popliteal crease to decrease risk of infection.  Baseline:  Goal status: INITIAL    PLAN:  PT FREQUENCY: 3x/week  PT DURATION: 6 weeks  PLANNED INTERVENTIONS: Therapeutic exercises, Therapeutic activity, Patient/Family education, Self Care, Orthotic/Fit training, Manual lymph drainage, Compression bandaging, Vasopneumatic device, Manual therapy, and Re-evaluation  PLAN FOR NEXT SESSION: continue  CDT to LLE, reminder to pt to come in with bandages on in midweek session for measurement assess pt's old garment (Elvarex CCL 3)   Allyson Sabal [place], PT 10/02/2022, 11:06 AM   [facility] [address] [place], [place], [address] Phone: [phone]   Fax:  [phone]  Patient Details  Name: HALENA BLACKWATER MRN: [id number] Date of Birth: Mar 24, 1951 Referring Provider:  Ladell Pier, MD  Encounter Date: 10/02/2022   Manus Gunning, PT 10/02/2022, 11:06 AM  [facility] [address] [place], [place], [address] Phone: [phone]   Fax:  [phone]

## 2022-10-04 ENCOUNTER — Ambulatory Visit: Payer: Medicare HMO | Admitting: Rehabilitation

## 2022-10-04 ENCOUNTER — Encounter: Payer: Self-pay | Admitting: Rehabilitation

## 2022-10-04 DIAGNOSIS — I890 Lymphedema, not elsewhere classified: Secondary | ICD-10-CM

## 2022-10-04 DIAGNOSIS — R262 Difficulty in walking, not elsewhere classified: Secondary | ICD-10-CM | POA: Diagnosis not present

## 2022-10-04 DIAGNOSIS — C55 Malignant neoplasm of uterus, part unspecified: Secondary | ICD-10-CM

## 2022-10-04 NOTE — Therapy (Signed)
OUTPATIENT PHYSICAL THERAPY ONCOLOGY TREATMENT  Patient Name: Dana [name] MRN: [id number] DOB:June 27, 1951, 72 y.o., female Today's Date: 10/04/2022  END OF SESSION:  PT End of Session - 10/04/22 1401     Visit Number 5    Number of Visits 19    Date for PT Re-Evaluation 11/06/22    PT Start Time [id number]    PT Stop Time [id number]    PT Time Calculation (min) 55 min    Activity Tolerance Patient tolerated treatment well    Behavior During Therapy WFL for tasks assessed/performed              Past Medical History:  Diagnosis Date   Arthritis    GERD (gastroesophageal reflux disease)    History of blood transfusion    Hypothyroidism    Lymphedema    left leg- ted hose makes it worse   Thyroid disease    Uterine cancer ([place])    Endometrial cancer - surgery and chemo   Past Surgical History:  Procedure Laterality Date   ABDOMINAL HYSTERECTOMY     APPENDECTOMY     COLONOSCOPY W/ POLYPECTOMY     EYE SURGERY Bilateral    cataracts   IR IMAGING GUIDED PORT INSERTION  05/15/2019   IR REMOVAL TUN ACCESS W/ PORT W/O FL MOD SED  06/23/2020   LIVER BIOPSY     mass removed     2015 from pelvis   TOTAL HIP ARTHROPLASTY Right 07/13/2016   Procedure: RIGHT TOTAL HIP ARTHROPLASTY ANTERIOR APPROACH;  Surgeon: Leandrew Koyanagi, MD;  Location: [facility];  Service: Orthopedics;  Laterality: Right;   Patient Active Problem List   Diagnosis Date Noted   Pancytopenia, acquired ([place]) 05/16/2021   Superficial thrombophlebitis 05/16/2021   Cellulitis of left leg 03/24/2021   Cancer associated pain 03/03/2021   Immunocompromised patient ([place]) 04/28/2020   Preventive measure 03/09/2020   Leukopenia due to antineoplastic chemotherapy ([place]) 03/01/2020   Abdominal pain 10/31/2019   Peripheral neuropathy due to chemotherapy ([place]) 09/09/2019   Seborrheic keratoses, inflamed 08/15/2019   Other fatigue 07/23/2019   Physical debility 06/10/2019   Nausea with vomiting 05/23/2019   Goals of care,  counseling/discussion 05/12/2019   Lymphedema    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[place]) 04/21/2019   Hip joint replacement status 07/13/2016   Primary osteoarthritis of right hip 06/01/2016   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   Diarrhea 06/03/2015    PCP: Shon Baton, MD  REFERRING PROVIDER: Ladell Pier, MD  REFERRING DIAG: C55 (ICD-10-CM) - Endometrioid adenocarcinoma of uterus ([place]) I89.0 (ICD-10-CM) - Lymphedema  THERAPY DIAG:  Lymphedema, not elsewhere classified  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 Gross complete resection, tumor involve both ovaries, lymph nodes negative. 6 cycles of adjuvant Taxol/carboplatin. CT abdomen/pelvis 03/28/2019-enlarged right inguinal and iliac nodes. 04/10/2019-right inguinal lymph node biopsy-metastatic adenocarcinoma, ER 90%, PR 95%, PD-L1 combined positive score-1, MSS, tumor mutation burden-4. Cisplatin 05/16/2019 20-1 cycle. Radiation to the lateral pelvis and 1 lymph nodes November 2020. CT 12/72/020-decrease size of right inguinal and external iliac lymph nodes, no evidence of disease progression. Everolimus and letrozole 07/14/2019 - 04/06/2020. CT 10/20/2019-resolution of right iliac lymph node, decrease in size of right inguinal node. CT 03/08/2020-no evidence of recurrent or metastatic disease in the abdomen or pelvis. CT 05/31/2020-no evidence of recurrent or metastatic disease. CT  03/11/2021-increase right inguinal lymph node, enlargement of bilateral inguinal lymph nodes, subcutaneous soft tissue nodule at the anterior right thigh. Carboplatin 04/06/2021, 04/25/2021, 05/16/2021. CT 07/12/2021-mild decrease in size of bilateral inguinal lymphadenopathy, no progressive disease in the abdomen or pelvis, 0.6 cm left inguinal node was 0.8 cm previously. Letrozole 08/06/2021. CT abdomen/pelvis 04/10/2022-stable right inguinal lymph node, no new site of metastatic disease. CT right femur  04/10/2022 -2.2 x 1 cm anterior subcutaneous mass. Letrozole continued. CT right femur 07/19/2022-mild enlargement of right thigh subcutaneous mass stable right inguinal nodal mass. CTs 07/19/2022-stable right inguinal lymph node, no new or progressive disease. 07/13/16- THA, 01/05/14- total abdominal hysterectomy, neuropathy in bilateral feet    PAIN:  Are you having pain? No pt reports it aches at night  PRECAUTIONS: Other: R THA  WEIGHT BEARING RESTRICTIONS: No  FALLS:  Has patient fallen in last 6 months? No  LIVING ENVIRONMENT: Lives with: lives with their family and lives alone Lives in: House/apartment Stairs: No;  Has following equipment at home: None  OCCUPATION: retired  Psychologist, clinical: goes to gym daily and alternates between treadmill/recumbent bike for 20 min, and does weight machines  PATIENT GOALS: to be able to get some of the fluid out   OBJECTIVE:  LYMPHEDEMA ASSESSMENTS:  SURGERY TYPE/DATE: Total abdominal hysterectomy: Gross complete resection, tumor involved both ovaries, lymph nodes negative 01/05/14 NUMBER OF LYMPH NODES REMOVED: 21 nodes - pt unsure how many were bad CHEMOTHERAPY: completed RADIATION: completed INFECTIONS: 1 infection a few years ago LYMPHEDEMA ASSESSMENTS:   LOWER EXTREMITY LANDMARK RIGHT eval  At groin   30 cm proximal to suprapatella   20 cm proximal to suprapatella 57  10 cm proximal to suprapatella 49  At midpatella / popliteal crease 38.6  30  cm proximal to floor at lateral plantar foot 38.5  20 cm proximal to floor at lateral plantar foot 29.9  10 cm proximal to floor at lateral plantar foot 21.3  Circumference of ankle/heel   5 cm proximal to 1st MTP joint 21.1  Across MTP joint 21.7  Around proximal great toe 7.4  (Blank rows = not tested)  LOWER EXTREMITY LANDMARK 07/01/2019 LEFT eval  At groin    30 cm proximal to suprapatella    20 cm proximal to suprapatella 58 64  10 cm proximal to suprapatella 49.9 57.7  At midpatella / popliteal crease 37.8 48  30 cm proximal to floor at lateral plantar foot 39.3 51.1  20 cm proximal to floor at lateral plantar foot 31.4 44.8  10 cm proximal to floor at lateral plantar foot 22 31  Circumference of ankle/heel  5 cm proximal to 1st MTP joint 21 22.4  Across MTP joint 21.8 23  Around proximal great toe 7.2 7.7  (Blank rows = not tested)  LLIS: 60.29  TODAY'S TREATMENT:                                                                                                                                         DATE:  10/02/22: Manual lymph drainage in supine: short neck, 5 diaphragmatic breaths, left inguino-axillary anastamoses, left lower extremity from toes and dorsal foot to lateral hip redirecting along pathway. Then in sidelying posterior leg. STM to calf with deep fibrosis work with lotion and to around malleoli.  Noted slight redness at anterior ankle crease which pt says feels itchy.  Bandaging: Lotion to entire L leg then bandaging with Tg soft liner size Lg foot to thigh,  rosidal soft foam foot rectangle held on and shaped with artiflex and then rosidal soft from ankle to thigh.  8cm at foot, 10 cm roman sandal, 10cm from ankle to knee, 12 cm from ankle to just above knee in herringbone, then 2 12 cm bandages from knee to groin Did not badage toes today as pt did not tolerate this well  Assessed old stocking which is a thigh high but only goes up to around mid lower thigh.  It  is also hard for her to put on as a class 3.   09/29/22:  In supine with left leg elevated. Short neck, superficial and deep abdominals, left axillary nodes with left inguino-axillary anastomosis, right inguinal nodes with anterior inter inguinal anastomosis. Extra stationary circles with direction toward center on abdomen the manual lymph drainage of left thigh, leg foot and ankle with return along pathways and extra time on firm areas of lower leg. Coco butter to dry skin of lower leg then bandaging with Tg soft liner Lg foot to thigh,  rosidal soft foam foot rectangle held on and shaped with artiflex and then rosidal soft from ankle to thigh.  8cm roman sandal, 10cm ASH and up lower leg, 10cm into thigh and then 12cm from foot to ankle.  Did not badage toes today.  Reinforced remedial exercises and standing heel lifts along with elevation of leg at home    09/27/22: Bandaging set up today got all supplies ready and cut kidneys for malleoli but did not end up using them. : Tg soft liner Lg foot to thigh, toes 1-4, rosidal soft foam foot rectangle held on and shaped with artiflex and then rosidal soft from ankle to thigh.  8cm roman sandal, 10cm ASH and up lower leg, 10cm into thigh and then 12cm from foot to ankle.   Applied cast shoe and reiterated safety and wear of compression.   09/25/22: unable to bandage today because pt does not have proper footwear - will bring at next session  PATIENT EDUCATION:  Education details:  importance of compression during exercise, how compression bandages work with muscle to move fluid Person educated: Patient Education method: Explanation Education comprehension: verbalized understanding  HOME EXERCISE PROGRAM: Continue compression pump  ASSESSMENT:  CLINICAL IMPRESSION: Continued with MLD followed by compression bandaging today.   OBJECTIVE IMPAIRMENTS: decreased knowledge of condition, difficulty walking, increased edema, and pain.   GOALS: Goals  reviewed with patient? Yes  SHORT TERM GOALS: Target date: 10/16/22  Pt will demonstrate a 3 cm decrease in edema at popliteal crease to decrease knee discomfort.  Baseline: Goal status: INITIAL  2.  Pt will demonstrate a 3 cm decrease in edema at 30 cm superior to the floor to decrease risk of infection.  Baseline:  Goal status: INITIAL    LONG TERM GOALS: Target date: 11/06/22  Pt will obtain appropriate compression garment for long term management of edema.  Baseline:  Goal status: INITIAL  2.  Pt will demonstrate a 50% improvement in fibrosis to decrease risk of infection.  Baseline:  Goal status: INITIAL  3.  Pt will report she is able to sleep at night without increased discomfort from her L LE.  Baseline:  Goal status: INITIAL  4.  Pt will be able to independently manage her lymphedema through a compression garment and using the compression pump.  Baseline:  Goal status: INITIAL  5.  Pt will demonstrate a 9 cm reduction in edema at popliteal crease to decrease risk of infection.  Baseline:  Goal status: INITIAL    PLAN:  PT FREQUENCY: 3x/week  PT DURATION: 6 weeks  PLANNED INTERVENTIONS: Therapeutic exercises, Therapeutic activity, Patient/Family education, Self Care, Orthotic/Fit training, Manual lymph drainage, Compression bandaging, Vasopneumatic device, Manual therapy, and Re-evaluation  PLAN FOR NEXT SESSION: continue  CDT to LLE,     Stark Bray, PT 10/04/2022, 3:00 PM   [facility] [address] [place], [place], [address] Phone: [phone]   Fax:  [phone]  Patient Details  Name: ITZAMARA TATRO MRN: [id number] Date of Birth: 11/26/1950 Referring Provider:  Ladell Pier, MD  Encounter Date: 10/04/2022   Stark Bray, PT 10/04/2022, 3:00 PM  [facility] [address] [place], [place], [address] Phone: [phone]   Fax:  [phone]

## 2022-10-06 ENCOUNTER — Ambulatory Visit: Payer: Medicare HMO | Admitting: Rehabilitation

## 2022-10-06 DIAGNOSIS — R262 Difficulty in walking, not elsewhere classified: Secondary | ICD-10-CM | POA: Diagnosis not present

## 2022-10-06 DIAGNOSIS — C55 Malignant neoplasm of uterus, part unspecified: Secondary | ICD-10-CM

## 2022-10-06 DIAGNOSIS — I890 Lymphedema, not elsewhere classified: Secondary | ICD-10-CM

## 2022-10-06 NOTE — Therapy (Signed)
OUTPATIENT PHYSICAL THERAPY ONCOLOGY TREATMENT  Patient Name: Dana [name] MRN: [id number] DOB:Apr 21, 1951, 72 y.o., female Today's Date: 10/06/2022  END OF SESSION:  PT End of Session - 10/06/22 1201     Visit Number 6    Number of Visits 19    Date for PT Re-Evaluation 11/06/22    PT Start Time 1000    PT Stop Time 1058    PT Time Calculation (min) 58 min    Activity Tolerance Patient tolerated treatment well    Behavior During Therapy WFL for tasks assessed/performed               Past Medical History:  Diagnosis Date   Arthritis    GERD (gastroesophageal reflux disease)    History of blood transfusion    Hypothyroidism    Lymphedema    left leg- ted hose makes it worse   Thyroid disease    Uterine cancer ([place])    Endometrial cancer - surgery and chemo   Past Surgical History:  Procedure Laterality Date   ABDOMINAL HYSTERECTOMY     APPENDECTOMY     COLONOSCOPY W/ POLYPECTOMY     EYE SURGERY Bilateral    cataracts   IR IMAGING GUIDED PORT INSERTION  05/15/2019   IR REMOVAL TUN ACCESS W/ PORT W/O FL MOD SED  06/23/2020   LIVER BIOPSY     mass removed     2015 from pelvis   TOTAL HIP ARTHROPLASTY Right 07/13/2016   Procedure: RIGHT TOTAL HIP ARTHROPLASTY ANTERIOR APPROACH;  Surgeon: Leandrew Koyanagi, MD;  Location: [facility];  Service: Orthopedics;  Laterality: Right;   Patient Active Problem List   Diagnosis Date Noted   Pancytopenia, acquired ([place]) 05/16/2021   Superficial thrombophlebitis 05/16/2021   Cellulitis of left leg 03/24/2021   Cancer associated pain 03/03/2021   Immunocompromised patient ([place]) 04/28/2020   Preventive measure 03/09/2020   Leukopenia due to antineoplastic chemotherapy ([place]) 03/01/2020   Abdominal pain 10/31/2019   Peripheral neuropathy due to chemotherapy ([place]) 09/09/2019   Seborrheic keratoses, inflamed 08/15/2019   Other fatigue 07/23/2019   Physical debility 06/10/2019   Nausea with vomiting 05/23/2019   Goals of care,  counseling/discussion 05/12/2019   Lymphedema    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[place]) 04/21/2019   Hip joint replacement status 07/13/2016   Primary osteoarthritis of right hip 06/01/2016   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 Gross complete resection, tumor involve both ovaries, lymph nodes negative. 6 cycles of adjuvant Taxol/carboplatin. CT abdomen/pelvis 03/28/2019-enlarged right inguinal and iliac nodes. 04/10/2019-right inguinal lymph node biopsy-metastatic adenocarcinoma, ER 90%, PR 95%, PD-L1 combined positive score-1, MSS, tumor mutation burden-4. Cisplatin 05/16/2019 20-1 cycle. Radiation to the lateral pelvis and 1 lymph nodes November 2020. CT 12/72/020-decrease size of right inguinal and external iliac lymph nodes, no evidence of disease progression. Everolimus and letrozole 07/14/2019 - 04/06/2020. CT 10/20/2019-resolution of right iliac lymph node, decrease in size of right inguinal node. CT 03/08/2020-no evidence of recurrent or metastatic disease in the abdomen or pelvis. CT 05/31/2020-no evidence of recurrent or  metastatic disease. CT 03/11/2021-increase right inguinal lymph node, enlargement of bilateral inguinal lymph nodes, subcutaneous soft tissue nodule at the anterior right thigh. Carboplatin 04/06/2021, 04/25/2021, 05/16/2021. CT 07/12/2021-mild decrease in size of bilateral inguinal lymphadenopathy, no progressive disease in the abdomen or pelvis, 0.6 cm left inguinal node was 0.8 cm previously. Letrozole 08/06/2021. CT abdomen/pelvis 04/10/2022-stable right inguinal lymph node, no new site of metastatic disease. CT right femur  04/10/2022 -2.2 x 1 cm anterior subcutaneous mass. Letrozole continued. CT right femur 07/19/2022-mild enlargement of right thigh subcutaneous mass stable right inguinal nodal mass. CTs 07/19/2022-stable right inguinal lymph node, no new or progressive disease. 07/13/16- THA, 01/05/14- total abdominal hysterectomy, neuropathy in bilateral feet    PAIN:  Are you having pain? No pt reports it aches at night  PRECAUTIONS: Other: R THA  WEIGHT BEARING RESTRICTIONS: No  FALLS:  Has patient fallen in last 6 months? No  LIVING ENVIRONMENT: Lives with: lives with their family and lives alone Lives in: House/apartment Stairs: No;  Has following equipment at home: None  OCCUPATION: retired  Psychologist, clinical: goes to gym daily and alternates between treadmill/recumbent bike for 20 min, and does weight machines  PATIENT GOALS: to be able to get some of the fluid out   OBJECTIVE:  LYMPHEDEMA ASSESSMENTS:  SURGERY TYPE/DATE: Total abdominal hysterectomy: Gross complete resection, tumor involved both ovaries, lymph nodes negative 01/05/14 NUMBER OF LYMPH NODES REMOVED: 21 nodes - pt unsure how many were bad CHEMOTHERAPY: completed RADIATION: completed INFECTIONS: 1 infection a few years ago LYMPHEDEMA ASSESSMENTS:   LOWER EXTREMITY LANDMARK RIGHT eval  At groin   30 cm proximal to suprapatella   20 cm proximal to suprapatella 57  10 cm proximal to suprapatella 49  At midpatella /  popliteal crease 38.6  30 cm proximal to floor at lateral plantar foot 38.5  20 cm proximal to floor at lateral plantar foot 29.9  10 cm proximal to floor at lateral plantar foot 21.3  Circumference of ankle/heel   5 cm proximal to 1st MTP joint 21.1  Across MTP joint 21.7  Around proximal great toe 7.4  (Blank rows = not tested)  LOWER EXTREMITY LANDMARK 07/01/2019 LEFT eval 10/06/22  At groin     30 cm proximal to suprapatella     20 cm proximal to suprapatella 58 64 63.2  10 cm proximal to suprapatella 49.9 57.7 54  At midpatella / popliteal crease 37.8 48 48  30 cm proximal to floor at lateral plantar foot 39.3 51.1 47.6  20 cm proximal to floor at lateral plantar foot 31.4 44.8 41  10 cm proximal  to floor at lateral plantar foot 22 31 28.5  Circumference of ankle/heel     5 cm proximal to 1st MTP joint 21 22.4 22.6  Across MTP joint 21.8 23 22.8  Around proximal great toe 7.2 7.7 7.7  (Blank rows = not tested)  LLIS: 60.29  TODAY'S TREATMENT:                                                                                                                                         DATE:  10/02/22: Remeasured Manual lymph drainage in supine: short neck, 5 diaphragmatic breaths, left inguino-axillary anastamoses, left lower extremity from toes and dorsal foot to lateral hip redirecting along pathway. Then in sidelying posterior leg. STM to calf with deep fibrosis work with lotion and to around malleoli.  Bandaging: Lotion to entire L leg then bandaging with Tg soft liner size Lg foot to thigh,  rosidal soft foam foot rectangle held on and shaped with artiflex and then rosidal soft from ankle to thigh.  8cm at foot, 10 cm roman sandal, 10cm from ankle to knee, 12 cm from ankle to just above knee in herringbone, then 2 12 cm bandages from knee to groin  10/02/22: Manual lymph drainage in supine: short neck, 5 diaphragmatic breaths, left inguino-axillary anastamoses, left lower extremity  from toes and dorsal foot to lateral hip redirecting along pathway. Then in sidelying posterior leg. STM to calf with deep fibrosis work with lotion and to around malleoli.  Noted slight redness at anterior ankle crease which pt says feels itchy.  Bandaging: Lotion to entire L leg then bandaging with Tg soft liner size Lg foot to thigh,  rosidal soft foam foot rectangle held on and shaped with artiflex and then rosidal soft from ankle to thigh.  8cm at foot, 10 cm roman sandal, 10cm from ankle to knee, 12 cm from ankle to just above knee in herringbone, then 2 12 cm bandages from knee to groin Did not badage toes today as pt did not tolerate this well  Assessed old stocking which is a thigh high but only goes up to around mid lower thigh.  It is also hard for her to put on as a class 3.   09/29/22:  In supine with left leg elevated. Short neck, superficial and deep abdominals, left axillary nodes with left inguino-axillary anastomosis, right inguinal nodes with anterior inter inguinal anastomosis. Extra stationary circles with direction toward center on abdomen the manual lymph drainage of left thigh, leg foot and ankle with return along pathways and extra time on firm areas of lower leg. Coco butter to dry skin of lower leg then bandaging with Tg soft liner Lg foot to thigh,  rosidal soft foam foot rectangle held on and shaped with artiflex and then rosidal soft from ankle to thigh.  8cm roman sandal, 10cm ASH and up lower leg, 10cm into thigh and  then 12cm from foot to ankle.  Did not badage toes today.  Reinforced remedial exercises and standing heel lifts along with elevation of leg at home    09/27/22: Bandaging set up today got all supplies ready and cut kidneys for malleoli but did not end up using them. : Tg soft liner Lg foot to thigh, toes 1-4, rosidal soft foam foot rectangle held on and shaped with artiflex and then rosidal soft from ankle to thigh.  8cm roman sandal, 10cm ASH and up lower leg,  10cm into thigh and then 12cm from foot to ankle.   Applied cast shoe and reiterated safety and wear of compression.   09/25/22: unable to bandage today because pt does not have proper footwear - will bring at next session  PATIENT EDUCATION:  Education details: importance of compression during exercise, how compression bandages work with muscle to move fluid Person educated: Patient Education method: Explanation Education comprehension: verbalized understanding  HOME EXERCISE PROGRAM: Continue compression pump  ASSESSMENT:  CLINICAL IMPRESSION: Continued with CDT of the left lower extremity.  Pt has made 3-4cm improvements in the lower extremity since evaluation.  Her skin was more mobile today but still with significant fibrosis.    OBJECTIVE IMPAIRMENTS: decreased knowledge of condition, difficulty walking, increased edema, and pain.   GOALS: Goals reviewed with patient? Yes  SHORT TERM GOALS: Target date: 10/16/22  Pt will demonstrate a 3 cm decrease in edema at popliteal crease to decrease knee discomfort.  Baseline: Goal status: INITIAL  2.  Pt will demonstrate a 3 cm decrease in edema at 30 cm superior to the floor to decrease risk of infection.  Baseline:  Goal status: INITIAL    LONG TERM GOALS: Target date: 11/06/22  Pt will obtain appropriate compression garment for long term management of edema.  Baseline:  Goal status: INITIAL  2.  Pt will demonstrate a 50% improvement in fibrosis to decrease risk of infection.  Baseline:  Goal status: INITIAL  3.  Pt will report she is able to sleep at night without increased discomfort from her L LE.  Baseline:  Goal status: INITIAL  4.  Pt will be able to independently manage her lymphedema through a compression garment and using the compression pump.  Baseline:  Goal status: INITIAL  5.  Pt will demonstrate a 9 cm reduction in edema at popliteal crease to decrease risk of infection.  Baseline:  Goal status:  INITIAL    PLAN:  PT FREQUENCY: 3x/week  PT DURATION: 6 weeks  PLANNED INTERVENTIONS: Therapeutic exercises, Therapeutic activity, Patient/Family education, Self Care, Orthotic/Fit training, Manual lymph drainage, Compression bandaging, Vasopneumatic device, Manual therapy, and Re-evaluation  PLAN FOR NEXT SESSION: continue  CDT to LLE,  Garments: pt will check on DME providers, emailed Kathlee Nations at [employer] to see if we can order there on 10/06/22.     Stark Bray, PT 10/06/2022, 12:01 PM

## 2022-10-10 ENCOUNTER — Ambulatory Visit: Payer: Medicare HMO

## 2022-10-10 DIAGNOSIS — R262 Difficulty in walking, not elsewhere classified: Secondary | ICD-10-CM | POA: Diagnosis not present

## 2022-10-10 DIAGNOSIS — C55 Malignant neoplasm of uterus, part unspecified: Secondary | ICD-10-CM | POA: Diagnosis not present

## 2022-10-10 DIAGNOSIS — I890 Lymphedema, not elsewhere classified: Secondary | ICD-10-CM | POA: Diagnosis not present

## 2022-10-10 NOTE — Therapy (Signed)
OUTPATIENT PHYSICAL THERAPY ONCOLOGY TREATMENT  Patient Name: Dana [name] MRN: [id number] DOB:09-05-1950, 72 y.o., female Today's Date: 10/10/2022  END OF SESSION:  PT End of Session - 10/10/22 0909     Visit Number 7    Number of Visits 19    Date for PT Re-Evaluation 11/06/22    PT Start Time 0907    PT Stop Time 1005    PT Time Calculation (min) 58 min    Activity Tolerance Patient tolerated treatment well    Behavior During Therapy WFL for tasks assessed/performed               Past Medical History:  Diagnosis Date   Arthritis    GERD (gastroesophageal reflux disease)    History of blood transfusion    Hypothyroidism    Lymphedema    left leg- ted hose makes it worse   Thyroid disease    Uterine cancer ([place])    Endometrial cancer - surgery and chemo   Past Surgical History:  Procedure Laterality Date   ABDOMINAL HYSTERECTOMY     APPENDECTOMY     COLONOSCOPY W/ POLYPECTOMY     EYE SURGERY Bilateral    cataracts   IR IMAGING GUIDED PORT INSERTION  05/15/2019   IR REMOVAL TUN ACCESS W/ PORT W/O FL MOD SED  06/23/2020   LIVER BIOPSY     mass removed     2015 from pelvis   TOTAL HIP ARTHROPLASTY Right 07/13/2016   Procedure: RIGHT TOTAL HIP ARTHROPLASTY ANTERIOR APPROACH;  Surgeon: Leandrew Koyanagi, MD;  Location: [facility];  Service: Orthopedics;  Laterality: Right;   Patient Active Problem List   Diagnosis Date Noted   Pancytopenia, acquired ([place]) 05/16/2021   Superficial thrombophlebitis 05/16/2021   Cellulitis of left leg 03/24/2021   Cancer associated pain 03/03/2021   Immunocompromised patient ([place]) 04/28/2020   Preventive measure 03/09/2020   Leukopenia due to antineoplastic chemotherapy ([place]) 03/01/2020   Abdominal pain 10/31/2019   Peripheral neuropathy due to chemotherapy ([place]) 09/09/2019   Seborrheic keratoses, inflamed 08/15/2019   Other fatigue 07/23/2019   Physical debility 06/10/2019   Nausea with vomiting 05/23/2019   Goals of care,  counseling/discussion 05/12/2019   Lymphedema    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[place]) 04/21/2019   Hip joint replacement status 07/13/2016   Primary osteoarthritis of right hip 06/01/2016   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 Gross complete resection, tumor involve both ovaries, lymph nodes negative. 6 cycles of adjuvant Taxol/carboplatin. CT abdomen/pelvis 03/28/2019-enlarged right inguinal and iliac nodes. 04/10/2019-right inguinal lymph node biopsy-metastatic adenocarcinoma, ER 90%, PR 95%, PD-L1 combined positive score-1, MSS, tumor mutation burden-4. Cisplatin 05/16/2019 20-1 cycle. Radiation to the lateral pelvis and 1 lymph nodes November 2020. CT 12/72/020-decrease size of right inguinal and external iliac lymph nodes, no evidence of disease progression. Everolimus and letrozole 07/14/2019 - 04/06/2020. CT 10/20/2019-resolution of right iliac lymph node, decrease in size of right inguinal node. CT 03/08/2020-no evidence of recurrent or  metastatic disease in the abdomen or pelvis. CT 05/31/2020-no evidence of recurrent or metastatic disease. CT 03/11/2021-increase right inguinal lymph node, enlargement of bilateral inguinal lymph nodes, subcutaneous soft tissue nodule at the anterior right thigh. Carboplatin 04/06/2021, 04/25/2021, 05/16/2021. CT 07/12/2021-mild decrease in size of bilateral inguinal lymphadenopathy, no progressive disease in the abdomen or pelvis, 0.6 cm left inguinal node was 0.8 cm previously. Letrozole 08/06/2021. CT abdomen/pelvis 04/10/2022-stable right inguinal lymph node, no new site of metastatic disease. CT right femur  04/10/2022 -2.2 x 1 cm anterior subcutaneous mass. Letrozole continued. CT right femur 07/19/2022-mild enlargement of right thigh subcutaneous mass stable right inguinal nodal mass. CTs 07/19/2022-stable right inguinal lymph node, no new or progressive disease. 07/13/16- THA, 01/05/14- total abdominal hysterectomy, neuropathy in bilateral feet    PAIN:  Are you having pain? No pt reports it aches at night  PRECAUTIONS: Other: R THA  WEIGHT BEARING RESTRICTIONS: No  FALLS:  Has patient fallen in last 6 months? No  LIVING ENVIRONMENT: Lives with: lives with their family and lives alone Lives in: House/apartment Stairs: No;  Has following equipment at home: None  OCCUPATION: retired  LEISURE: goes to gym daily and alternates between treadmill/recumbent bike for 20 min, and does weight machines  PATIENT GOALS: to be able to get some of the fluid out   OBJECTIVE:  LYMPHEDEMA ASSESSMENTS:  SURGERY TYPE/DATE: Total abdominal hysterectomy: Gross complete resection, tumor involved both ovaries, lymph nodes negative 01/05/14 NUMBER OF LYMPH NODES REMOVED: 21 nodes - pt unsure how many were bad CHEMOTHERAPY: completed RADIATION: completed INFECTIONS: 1 infection a few years ago LYMPHEDEMA ASSESSMENTS:   LOWER EXTREMITY LANDMARK RIGHT eval  At groin   30 cm proximal to suprapatella   20 cm  proximal to suprapatella 57  10 cm proximal to suprapatella 49  At midpatella / popliteal crease 38.6  30 cm proximal to floor at lateral plantar foot 38.5  20 cm proximal to floor at lateral plantar foot 29.9  10 cm proximal to floor at lateral plantar foot 21.3  Circumference of ankle/heel   5 cm proximal to 1st MTP joint 21.1  Across MTP joint 21.7  Around proximal great toe 7.4  (Blank rows = not tested)  LOWER EXTREMITY LANDMARK 07/01/2019 LEFT eval 10/06/22  At groin     30 cm proximal to suprapatella     20  cm proximal to suprapatella 58 64 63.2  10 cm proximal to suprapatella 49.9 57.7 54  At midpatella / popliteal crease 37.8 48 48  30 cm proximal to floor at lateral plantar foot 39.3 51.1 47.6  20 cm  proximal to floor at lateral plantar foot 31.4 44.8 41  10 cm proximal to floor at lateral plantar foot 22 31 28.5  Circumference of ankle/heel     5 cm proximal to 1st MTP joint 21 22.4 22.6  Across MTP joint 21.8 23 22.8  Around proximal great toe 7.2 7.7 7.7  (Blank rows = not tested)  LLIS: 60.29  TODAY'S TREATMENT:                                                                                                                                         DATE:  10/10/22: Manual Therapy Manual lymph drainage in supine: short neck, superficial and deep abdominals, left inguino-axillary anastamoses, left lower extremity from toes and dorsal foot to lateral hip redirecting along. Then in sidelying posterior leg for calf with fibrotic techniques   Bandaging: Lotion to entire L leg then bandaging with Tg soft liner size Lg foot to thigh,  rosidal soft foam foot rectangle held on and shaped with artiflex and then rosidal soft from ankle to thigh.  8cm at foot/ankle, 10 cm ankle-knee spiral, 10cm from ankle to knee herring bone, 12 cm from below knee to mid thigh with "X" behind knee, then 12 cm spiral from knee to groin and 12 cm from shin to thigh, pulled stockinette and pts biker  shorts over top of bandage and assisted with donning sock and cast shoe on Lt foot  10/06/22: Remeasured Manual lymph drainage in supine: short neck, 5 diaphragmatic breaths, left inguino-axillary anastamoses, left lower extremity from toes and dorsal foot to lateral hip redirecting along. Then in sidelying posterior leg. STM to calf with deep fibrosis work with lotion and to around malleoli.  Bandaging: Lotion to entire L leg then bandaging with Tg soft liner size Lg foot to thigh,  rosidal soft foam foot rectangle held on and shaped with artiflex and then rosidal soft from ankle to thigh.  8cm at foot, 10 cm roman sandal, 10cm from ankle to knee, 12 cm from ankle to just above knee in herringbone, then 2 12 cm bandages from knee to groin  10/02/22: Manual lymph drainage in supine: short neck, 5 diaphragmatic breaths, left inguino-axillary anastamoses, left lower extremity from toes and dorsal foot to lateral hip redirecting along pathway. Then in sidelying posterior leg. STM to calf with deep fibrosis work with lotion and to around malleoli.  Noted slight redness at anterior ankle crease which pt says feels itchy.  Bandaging: Lotion to entire L leg then bandaging with Tg soft liner size Lg foot to thigh,  rosidal soft foam foot rectangle held on and shaped with artiflex and then rosidal soft from ankle to thigh.  8cm at foot, 10 cm roman sandal, 10cm from ankle to knee, 12 cm from ankle to just above knee in herringbone, then 2 12 cm bandages from knee to groin Did not badage toes  today as pt did not tolerate this well  Assessed old stocking which is a thigh high but only goes up to around mid lower thigh.  It is also hard for her to put on as a class 3.   09/29/22:  In supine with left leg elevated. Short neck, superficial and deep abdominals, left axillary nodes with left inguino-axillary anastomosis, right inguinal nodes with anterior inter inguinal anastomosis. Extra stationary circles with direction  toward center on abdomen the manual lymph drainage of left thigh, leg foot and ankle with return along pathways and extra time on firm areas of lower leg. Coco butter to dry skin of lower leg then bandaging with Tg soft liner Lg foot to thigh,  rosidal soft foam foot rectangle held on and shaped with artiflex and then rosidal soft from ankle to thigh.  8cm roman sandal, 10cm ASH and up lower leg, 10cm into thigh and then 12cm from foot to ankle.  Did not badage toes today.  Reinforced remedial exercises and standing heel lifts along with elevation of leg at home    PATIENT EDUCATION:  Education details: importance of compression during exercise, how compression bandages work with muscle to move fluid Person educated: Patient Education method: Explanation Education comprehension: verbalized understanding  HOME EXERCISE PROGRAM: Continue compression pump  ASSESSMENT:  CLINICAL IMPRESSION: Continued with CDT of the left lower extremity. Pts skin is tolerating bandaging well with no redness and she reports noticing overall her leg is smaller.   OBJECTIVE IMPAIRMENTS: decreased knowledge of condition, difficulty walking, increased edema, and pain.   GOALS: Goals reviewed with patient? Yes  SHORT TERM GOALS: Target date: 10/16/22  Pt will demonstrate a 3 cm decrease in edema at popliteal crease to decrease knee discomfort.  Baseline: Goal status: INITIAL  2.  Pt will demonstrate a 3 cm decrease in edema at 30 cm superior to the floor to decrease risk of infection.  Baseline:  Goal status: INITIAL    LONG TERM GOALS: Target date: 11/06/22  Pt will obtain appropriate compression garment for long term management of edema.  Baseline:  Goal status: INITIAL  2.  Pt will demonstrate a 50% improvement in fibrosis to decrease risk of infection.  Baseline:  Goal status: INITIAL  3.  Pt will report she is able to sleep at night without increased discomfort from her L LE.  Baseline:  Goal  status: INITIAL  4.  Pt will be able to independently manage her lymphedema through a compression garment and using the compression pump.  Baseline:  Goal status: INITIAL  5.  Pt will demonstrate a 9 cm reduction in edema at popliteal crease to decrease risk of infection.  Baseline:  Goal status: INITIAL    PLAN:  PT FREQUENCY: 3x/week  PT DURATION: 6 weeks  PLANNED INTERVENTIONS: Therapeutic exercises, Therapeutic activity, Patient/Family education, Self Care, Orthotic/Fit training, Manual lymph drainage, Compression bandaging, Vasopneumatic device, Manual therapy, and Re-evaluation  PLAN FOR NEXT SESSION: Remeasure next, continue  CDT to LLE Garments: pt will check on DME providers, emailed Kathlee Nations at [employer] to see if we can order there on 10/06/22.     Otelia Limes, PTA 10/10/2022, 10:09 AM

## 2022-10-11 ENCOUNTER — Ambulatory Visit: Payer: Medicare HMO | Admitting: Oncology

## 2022-10-12 ENCOUNTER — Ambulatory Visit: Payer: Medicare HMO

## 2022-10-12 DIAGNOSIS — I890 Lymphedema, not elsewhere classified: Secondary | ICD-10-CM | POA: Diagnosis not present

## 2022-10-12 DIAGNOSIS — C55 Malignant neoplasm of uterus, part unspecified: Secondary | ICD-10-CM | POA: Diagnosis not present

## 2022-10-12 DIAGNOSIS — R262 Difficulty in walking, not elsewhere classified: Secondary | ICD-10-CM | POA: Diagnosis not present

## 2022-10-12 NOTE — Therapy (Signed)
OUTPATIENT PHYSICAL THERAPY ONCOLOGY TREATMENT  Patient Name: Dana [name] MRN: [id number] DOB:01-09-1951, 72 y.o., female Today's Date: 10/12/2022  END OF SESSION:  PT End of Session - 10/12/22 0907     Visit Number 8    Number of Visits 19    Date for PT Re-Evaluation 11/06/22    PT Start Time 0906    PT Stop Time 1012    PT Time Calculation (min) 66 min    Activity Tolerance Patient tolerated treatment well    Behavior During Therapy WFL for tasks assessed/performed               Past Medical History:  Diagnosis Date   Arthritis    GERD (gastroesophageal reflux disease)    History of blood transfusion    Hypothyroidism    Lymphedema    left leg- ted hose makes it worse   Thyroid disease    Uterine cancer ([place])    Endometrial cancer - surgery and chemo   Past Surgical History:  Procedure Laterality Date   ABDOMINAL HYSTERECTOMY     APPENDECTOMY     COLONOSCOPY W/ POLYPECTOMY     EYE SURGERY Bilateral    cataracts   IR IMAGING GUIDED PORT INSERTION  05/15/2019   IR REMOVAL TUN ACCESS W/ PORT W/O FL MOD SED  06/23/2020   LIVER BIOPSY     mass removed     2015 from pelvis   TOTAL HIP ARTHROPLASTY Right 07/13/2016   Procedure: RIGHT TOTAL HIP ARTHROPLASTY ANTERIOR APPROACH;  Surgeon: Leandrew Koyanagi, MD;  Location: [facility];  Service: Orthopedics;  Laterality: Right;   Patient Active Problem List   Diagnosis Date Noted   Pancytopenia, acquired ([place]) 05/16/2021   Superficial thrombophlebitis 05/16/2021   Cellulitis of left leg 03/24/2021   Cancer associated pain 03/03/2021   Immunocompromised patient ([place]) 04/28/2020   Preventive measure 03/09/2020   Leukopenia due to antineoplastic chemotherapy ([place]) 03/01/2020   Abdominal pain 10/31/2019   Peripheral neuropathy due to chemotherapy ([place]) 09/09/2019   Seborrheic keratoses, inflamed 08/15/2019   Other fatigue 07/23/2019   Physical debility 06/10/2019   Nausea with vomiting 05/23/2019   Goals of care,  counseling/discussion 05/12/2019   Lymphedema    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[place]) 04/21/2019   Hip joint replacement status 07/13/2016   Primary osteoarthritis of right hip 06/01/2016   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 I normally have to rewrap the upper but I didn't have to this time.    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 Gross complete resection, tumor involve both ovaries, lymph nodes negative. 6 cycles of adjuvant Taxol/carboplatin. CT abdomen/pelvis 03/28/2019-enlarged right inguinal and iliac nodes. 04/10/2019-right inguinal lymph node biopsy-metastatic adenocarcinoma, ER 90%, PR 95%, PD-L1 combined positive score-1, MSS, tumor mutation burden-4. Cisplatin 05/16/2019 20-1 cycle. Radiation to the lateral pelvis and 1 lymph nodes November 2020. CT 12/72/020-decrease size of right inguinal and external iliac lymph nodes, no evidence of disease progression. Everolimus and letrozole 07/14/2019 - 04/06/2020. CT 10/20/2019-resolution of right iliac lymph node, decrease in size of right inguinal node. CT 03/08/2020-no evidence of recurrent or  metastatic disease in the abdomen or pelvis. CT 05/31/2020-no evidence of recurrent or metastatic disease. CT 03/11/2021-increase right inguinal lymph node, enlargement of bilateral inguinal lymph nodes, subcutaneous soft tissue nodule at the anterior right thigh. Carboplatin 04/06/2021, 04/25/2021, 05/16/2021. CT 07/12/2021-mild decrease in size of bilateral inguinal lymphadenopathy, no progressive disease in the abdomen or pelvis, 0.6 cm left inguinal node was 0.8 cm previously. Letrozole 08/06/2021. CT abdomen/pelvis 04/10/2022-stable right inguinal lymph node, no new site of metastatic disease. CT right femur  04/10/2022 -2.2 x 1 cm anterior subcutaneous mass. Letrozole continued. CT right femur 07/19/2022-mild enlargement of right thigh subcutaneous mass stable right inguinal nodal mass. CTs 07/19/2022-stable right inguinal lymph node, no new or progressive disease. 07/13/16- THA, 01/05/14- total abdominal hysterectomy, neuropathy in bilateral feet    PAIN:  Are you having pain? No pt reports it aches at night  PRECAUTIONS: Other: R THA  WEIGHT BEARING RESTRICTIONS: No  FALLS:  Has patient fallen in last 6 months? No  LIVING ENVIRONMENT: Lives with: lives with their family and lives alone Lives in: House/apartment Stairs: No;  Has following equipment at home: None  OCCUPATION: retired  Psychologist, clinical: goes to gym daily and alternates between treadmill/recumbent bike for 20 min, and does weight machines  PATIENT GOALS: to be able to get some of the fluid out   OBJECTIVE:  LYMPHEDEMA ASSESSMENTS:  SURGERY TYPE/DATE: Total abdominal hysterectomy: Gross complete resection, tumor involved both ovaries, lymph nodes negative 01/05/14 NUMBER OF LYMPH NODES REMOVED: 21 nodes - pt unsure how many were bad CHEMOTHERAPY: completed RADIATION: completed INFECTIONS: 1 infection a few years ago LYMPHEDEMA ASSESSMENTS:   LOWER EXTREMITY LANDMARK RIGHT eval  At groin   30 cm proximal to suprapatella   20 cm  proximal to suprapatella 57  10 cm proximal to suprapatella 49  At midpatella / popliteal crease 38.6  30 cm proximal to floor at lateral plantar foot 38.5  20 cm proximal to floor at lateral plantar foot 29.9  10 cm proximal to floor at lateral plantar foot 21.3  Circumference of ankle/heel   5 cm proximal to 1st MTP joint 21.1  Across MTP joint 21.7  Around proximal great toe 7.4  (Blank rows = not tested)  LOWER EXTREMITY LANDMARK 07/01/2019 LEFT eval 10/06/22 10/12/22  At groin      30 cm proximal to suprapatella      20 cm proximal to suprapatella 58 64 63.2 63.7  10 cm proximal to suprapatella 49.9 57.7 54 52.8  At midpatella / popliteal crease 37.8 48 48 42.1  30 cm proximal to floor at  lateral plantar foot 39.3 51.1 47.6 45  20 cm proximal to floor at lateral plantar foot 31.4 44.8 41 37.8  10 cm proximal to floor at lateral plantar foot 22 31 28.5 28.5  Circumference of ankle/heel      5 cm proximal to 1st MTP joint 21 22.4 22.6 22.9  Across MTP joint 21.8 23 22.8 22.4  Around proximal great toe 7.2 7.7 7.7 7.4  (Blank rows = not tested)  LLIS: 60.29  TODAY'S TREATMENT:                                                                                                                                         DATE:  10/12/22: Manual Therapy Remeasured circumference of Lt LE, see above Manual lymph drainage in supine: short neck, superficial and deep abdominals, left inguino-axillary anastamoses, left lower extremity from toes and dorsal foot to lateral hip redirecting along. Then in sidelying posterior leg for calf with fibrotic techniques mostly at lateral malleolus (when in supine and S/L) then finished retracing back up Lt LE in supine ending with anastomosis and Lt axillary nodes. Bandaging: Cocoa butter to entire Lt leg then bandaging with Tg soft liner size Med foot to thigh,  rosidal soft foam foot rectangle to dorsal foot and 1/2" gray foam wagon wheel to lateral malleolus  fixated with artiflex and then rosidal soft from ankle to thigh.  8cm at foot/ankle, 10 cm ankle-knee herring bone fashion, 10cm from ankle to knee spiral, 12 cm spiral from below knee to mid thigh with "X" behind knee, then 12 cm herring bone fashion from knee to groin and 12 cm spiral from shin to thigh, pulled stockinette and pts biker shorts over top of bandage and assisted with donning sock and cast shoe on Lt foot  10/10/22: Manual Therapy Manual lymph drainage in supine: short neck, superficial and deep abdominals, left inguino-axillary anastamoses, left lower extremity from toes and dorsal foot to lateral hip redirecting along. Then in sidelying posterior leg for calf with fibrotic techniques   Bandaging: Lotion to entire L leg then bandaging with Tg soft liner size Med foot to thigh,  rosidal soft foam foot rectangle held on and shaped with artiflex and then rosidal soft from ankle to thigh.  8cm at foot/ankle, 10 cm ankle-knee spiral, 10cm from ankle to knee herring bone, 12 cm from below knee to mid thigh with "X" behind knee, then 12 cm spiral from knee to groin and 12 cm from shin to thigh, pulled stockinette and pts biker shorts over top of bandage and assisted with donning sock and cast shoe on Lt foot  10/06/22: Remeasured Manual lymph drainage in supine: short neck, 5 diaphragmatic breaths, left inguino-axillary anastamoses, left lower extremity from toes and dorsal foot to lateral hip redirecting along. Then in sidelying posterior leg. STM to calf with deep fibrosis work with lotion and to around malleoli.  Bandaging: Lotion to entire L leg then bandaging with Tg soft liner size Lg foot to thigh,  rosidal soft foam foot rectangle held on and shaped with artiflex and then rosidal soft from ankle to thigh.  8cm at foot, 10 cm roman sandal, 10cm from ankle to knee, 12 cm from ankle to just above knee in herringbone, then 2 12 cm bandages from knee to groin    PATIENT EDUCATION:  Education  details: importance of compression during exercise, how compression bandages work with muscle to move fluid Person educated: Patient Education method: Explanation Education comprehension: verbalized understanding  HOME EXERCISE PROGRAM: Continue compression pump  ASSESSMENT:  CLINICAL IMPRESSION: Continued with CDT of the left lower extremity remeasuring circumference of Lt LE today. She is continuing to show great reductions since eval and continuing weekly. Her skin mobility is slowly improving as well at lower leg where she is most fibrotic. Added new compression foam to lateral malleolus working to further reduce fibrosis palpated here.   OBJECTIVE IMPAIRMENTS: decreased knowledge of condition, difficulty walking, increased edema, and pain.   GOALS: Goals reviewed with patient? Yes  SHORT TERM GOALS: Target date: 10/16/22  Pt will demonstrate a 3 cm decrease in edema at popliteal crease to decrease knee discomfort.  Baseline: Goal status: INITIAL  2.  Pt will demonstrate a 3 cm decrease in edema at 30 cm superior to the floor to decrease risk of infection.  Baseline:  Goal status: INITIAL    LONG TERM GOALS: Target date: 11/06/22  Pt will obtain appropriate compression garment for long term management of edema.  Baseline:  Goal status: INITIAL  2.  Pt will demonstrate a 50% improvement in fibrosis to decrease risk of infection.  Baseline:  Goal status: INITIAL  3.  Pt will report she is able to sleep at night without increased discomfort from her L LE.  Baseline:  Goal status: INITIAL  4.  Pt will be able to independently manage her lymphedema through a compression garment and using the compression pump.  Baseline:  Goal status: INITIAL  5.  Pt will demonstrate a 9 cm reduction in edema at popliteal crease to decrease risk of infection.  Baseline:  Goal status: INITIAL    PLAN:  PT FREQUENCY: 3x/week  PT DURATION: 6 weeks  PLANNED INTERVENTIONS: Therapeutic  exercises, Therapeutic activity, Patient/Family education, Self Care, Orthotic/Fit training, Manual lymph drainage, Compression bandaging, Vasopneumatic device, Manual therapy, and Re-evaluation  PLAN FOR NEXT SESSION: Continue  CDT to LLE, how was foam at lateral malleolus? Any softer here?  Garments: pt will check on DME providers, emailed Kathlee Nations at [employer] to see if we can order there on 10/06/22.     Otelia Limes, PTA 10/12/2022, 10:23 AM

## 2022-10-16 ENCOUNTER — Encounter: Payer: Self-pay | Admitting: Rehabilitation

## 2022-10-16 ENCOUNTER — Ambulatory Visit: Payer: Medicare HMO | Attending: Oncology | Admitting: Rehabilitation

## 2022-10-16 DIAGNOSIS — I890 Lymphedema, not elsewhere classified: Secondary | ICD-10-CM | POA: Insufficient documentation

## 2022-10-16 DIAGNOSIS — C55 Malignant neoplasm of uterus, part unspecified: Secondary | ICD-10-CM | POA: Insufficient documentation

## 2022-10-16 NOTE — Therapy (Signed)
OUTPATIENT PHYSICAL THERAPY ONCOLOGY TREATMENT  Patient Name: Dana [name] MRN: [id number] DOB:1951-03-02, 72 y.o., female Today's Date: 10/16/2022  END OF SESSION:  PT End of Session - 10/16/22 1000     Visit Number 9    Number of Visits 19    Date for PT Re-Evaluation 11/06/22    PT Start Time 1000    PT Stop Time 1102    PT Time Calculation (min) 62 min    Activity Tolerance Patient tolerated treatment well    Behavior During Therapy WFL for tasks assessed/performed                Past Medical History:  Diagnosis Date   Arthritis    GERD (gastroesophageal reflux disease)    History of blood transfusion    Hypothyroidism    Lymphedema    left leg- ted hose makes it worse   Thyroid disease    Uterine cancer    Endometrial cancer - surgery and chemo   Past Surgical History:  Procedure Laterality Date   ABDOMINAL HYSTERECTOMY     APPENDECTOMY     COLONOSCOPY W/ POLYPECTOMY     EYE SURGERY Bilateral    cataracts   IR IMAGING GUIDED PORT INSERTION  05/15/2019   IR REMOVAL TUN ACCESS W/ PORT W/O FL MOD SED  06/23/2020   LIVER BIOPSY     mass removed     2015 from pelvis   TOTAL HIP ARTHROPLASTY Right 07/13/2016   Procedure: RIGHT TOTAL HIP ARTHROPLASTY ANTERIOR APPROACH;  Surgeon: Leandrew Koyanagi, MD;  Location: [facility];  Service: Orthopedics;  Laterality: Right;   Patient Active Problem List   Diagnosis Date Noted   Pancytopenia, acquired 05/16/2021   Superficial thrombophlebitis 05/16/2021   Cellulitis of left leg 03/24/2021   Cancer associated pain 03/03/2021   Immunocompromised patient 04/28/2020   Preventive measure 03/09/2020   Leukopenia due to antineoplastic chemotherapy 03/01/2020   Abdominal pain 10/31/2019   Peripheral neuropathy due to chemotherapy 09/09/2019   Seborrheic keratoses, inflamed 08/15/2019   Other fatigue 07/23/2019   Physical debility 06/10/2019   Nausea with vomiting 05/23/2019   Goals of care, counseling/discussion 05/12/2019    Lymphedema    Secondary malignant neoplasm of inguinal lymph nodes 04/21/2019   Hip joint replacement status 07/13/2016   Primary osteoarthritis of right hip 06/01/2016   Endometrioid adenocarcinoma of uterus 06/03/2015   Diarrhea 06/03/2015    PCP: Shon Baton, MD  REFERRING PROVIDER: Ladell Pier, MD  REFERRING DIAG: C55 (ICD-10-CM) - Endometrioid adenocarcinoma of uterus ([place]) I89.0 (ICD-10-CM) - Lymphedema  THERAPY DIAG:  Lymphedema, not elsewhere classified  Endometrioid adenocarcinoma of uterus  ONSET DATE: 01/05/14  Rationale for Evaluation and Treatment: Rehabilitation  SUBJECTIVE:  SUBJECTIVE STATEMENT:  I was cheating a bit over the weekend I didn't wrap it much  PERTINENT HISTORY:  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 Gross complete resection, tumor involve both ovaries, lymph nodes negative. 6 cycles of adjuvant Taxol/carboplatin. CT abdomen/pelvis 03/28/2019-enlarged right inguinal and iliac nodes. 04/10/2019-right inguinal lymph node biopsy-metastatic adenocarcinoma, ER 90%, PR 95%, PD-L1 combined positive score-1, MSS, tumor mutation burden-4. Cisplatin 05/16/2019 20-1 cycle. Radiation to the lateral pelvis and 1 lymph nodes November 2020. CT 12/72/020-decrease size of right inguinal and external iliac lymph nodes, no evidence of disease progression. Everolimus and letrozole 07/14/2019 - 04/06/2020. CT 10/20/2019-resolution of right iliac lymph node, decrease in size of right inguinal node. CT 03/08/2020-no evidence of recurrent or metastatic disease in the abdomen or pelvis. CT 05/31/2020-no evidence of recurrent or metastatic disease. CT  03/11/2021-increase right inguinal lymph node, enlargement of bilateral inguinal lymph nodes, subcutaneous soft tissue nodule at the anterior right thigh. Carboplatin 04/06/2021, 04/25/2021, 05/16/2021. CT 07/12/2021-mild decrease in size of bilateral inguinal lymphadenopathy, no progressive disease in the abdomen or pelvis, 0.6 cm left inguinal node was 0.8 cm previously. Letrozole 08/06/2021. CT abdomen/pelvis 04/10/2022-stable right inguinal lymph node, no new site of metastatic disease. CT right femur  04/10/2022 -2.2 x 1 cm anterior subcutaneous mass. Letrozole continued. CT right femur 07/19/2022-mild enlargement of right thigh subcutaneous mass stable right inguinal nodal mass. CTs 07/19/2022-stable right inguinal lymph node, no new or progressive disease. 07/13/16- THA, 01/05/14- total abdominal hysterectomy, neuropathy in bilateral feet    PAIN:  Are you having pain? No pt reports it aches at night  PRECAUTIONS: Other: R THA  WEIGHT BEARING RESTRICTIONS: No  FALLS:  Has patient fallen in last 6 months? No  LIVING ENVIRONMENT: Lives with: lives with their family and lives alone Lives in: House/apartment Stairs: No;  Has following equipment at home: None  OCCUPATION: retired  Psychologist, clinical: goes to gym daily and alternates between treadmill/recumbent bike for 20 min, and does weight machines  PATIENT GOALS: to be able to get some of the fluid out   OBJECTIVE:  LYMPHEDEMA ASSESSMENTS:  SURGERY TYPE/DATE: Total abdominal hysterectomy: Gross complete resection, tumor involved both ovaries, lymph nodes negative 01/05/14 NUMBER OF LYMPH NODES REMOVED: 21 nodes - pt unsure how many were bad CHEMOTHERAPY: completed RADIATION: completed INFECTIONS: 1 infection a few years ago LYMPHEDEMA ASSESSMENTS:   LOWER EXTREMITY LANDMARK RIGHT eval  At groin   30 cm proximal to suprapatella   20 cm proximal to suprapatella 57  10 cm proximal to suprapatella 49  At midpatella / popliteal crease 38.6  30  cm proximal to floor at lateral plantar foot 38.5  20 cm proximal to floor at lateral plantar foot 29.9  10 cm proximal to floor at lateral plantar foot 21.3  Circumference of ankle/heel   5 cm proximal to 1st MTP joint 21.1  Across MTP joint 21.7  Around proximal great toe 7.4  (Blank rows = not tested)  LOWER EXTREMITY LANDMARK 07/01/2019 LEFT eval 10/06/22 10/12/22  At groin      30 cm proximal to suprapatella      20 cm proximal to suprapatella 58 64 63.2 63.7  10 cm proximal to suprapatella 49.9 57.7 54 52.8  At midpatella / popliteal crease 37.8 48 48 42.1  30 cm proximal to floor at lateral plantar foot 39.3 51.1 47.6 45  20 cm proximal  to floor at lateral plantar foot 31.4 44.8 41 37.8  10 cm proximal to floor at lateral plantar foot 22 31 28.5 28.5  Circumference of ankle/heel      5 cm proximal to 1st MTP joint 21 22.4 22.6 22.9  Across MTP joint 21.8 23 22.8 22.4  Around proximal great toe 7.2 7.7 7.7 7.4  (Blank rows = not tested)  LLIS: 60.29  TODAY'S TREATMENT:                                                                                                                                         DATE:  10/16/22: Manual Therapy Manual lymph drainage in supine: short neck, superficial and deep abdominals, left inguino-axillary anastamoses, left lower extremity from toes and dorsal foot to lateral hip redirecting along. Then in sidelying posterior leg for calf with fibrotic techniques mostly at lateral malleolus (when in supine and S/L) then finished retracing back up Lt LE in supine ending with anastomosis and Lt axillary nodes. Bandaging: Cocoa butter to entire Lt leg then bandaging with Tg soft liner size Med foot to thigh,  rosidal soft foam foot rectangle to dorsal foot and 1/2" gray foam wagon wheel to lateral malleolus fixated with artiflex and then rosidal soft from ankle to thigh.  8cm at foot/ankle, 10 cm ankle-knee herring bone fashion, 10cm from ankle to knee spiral,  12 cm spiral from below knee to mid thigh with "X" behind knee, then 12 cm herring bone fashion from knee to groin and 12 cm spiral from shin to thigh, pulled stockinette and pts biker shorts over top of bandage and assisted with donning sock and cast shoe on Lt foot  10/12/22: Manual Therapy Remeasured circumference of Lt LE, see above Manual lymph drainage in supine: short neck, superficial and deep abdominals, left inguino-axillary anastamoses, left lower extremity from toes and dorsal foot to lateral hip redirecting along. Then in sidelying posterior leg for calf with fibrotic techniques mostly at lateral malleolus (when in supine and S/L) then finished retracing back up Lt LE in supine ending with anastomosis and Lt axillary nodes. Bandaging: Cocoa butter to entire Lt leg then bandaging with Tg soft liner size Med foot to thigh,  rosidal soft foam foot rectangle to dorsal foot and 1/2" gray foam wagon wheel to lateral malleolus fixated with artiflex and then rosidal soft from ankle to thigh.  8cm at foot/ankle, 10 cm ankle-knee herring bone fashion, 10cm from ankle to knee spiral, 12 cm spiral from below knee to mid thigh with "X" behind knee, then 12 cm herring bone fashion from knee to groin and 12 cm spiral from shin to thigh, pulled stockinette and pts biker shorts over top of bandage and assisted with donning sock and cast shoe on Lt foot  10/10/22: Manual Therapy Manual lymph drainage in supine: short neck, superficial and deep abdominals, left inguino-axillary anastamoses, left lower  extremity from toes and dorsal foot to lateral hip redirecting along. Then in sidelying posterior leg for calf with fibrotic techniques   Bandaging: Lotion to entire L leg then bandaging with Tg soft liner size Med foot to thigh,  rosidal soft foam foot rectangle held on and shaped with artiflex and then rosidal soft from ankle to thigh.  8cm at foot/ankle, 10 cm ankle-knee spiral, 10cm from ankle to knee herring  bone, 12 cm from below knee to mid thigh with "X" behind knee, then 12 cm spiral from knee to groin and 12 cm from shin to thigh, pulled stockinette and pts biker shorts over top of bandage and assisted with donning sock and cast shoe on Lt foot  10/06/22: Remeasured Manual lymph drainage in supine: short neck, 5 diaphragmatic breaths, left inguino-axillary anastamoses, left lower extremity from toes and dorsal foot to lateral hip redirecting along. Then in sidelying posterior leg. STM to calf with deep fibrosis work with lotion and to around malleoli.  Bandaging: Lotion to entire L leg then bandaging with Tg soft liner size Lg foot to thigh,  rosidal soft foam foot rectangle held on and shaped with artiflex and then rosidal soft from ankle to thigh.  8cm at foot, 10 cm roman sandal, 10cm from ankle to knee, 12 cm from ankle to just above knee in herringbone, then 2 12 cm bandages from knee to groin    PATIENT EDUCATION:  Education details: importance of compression during exercise, how compression bandages work with muscle to move fluid Person educated: Patient Education method: Explanation Education comprehension: verbalized understanding  HOME EXERCISE PROGRAM: Continue compression pump  ASSESSMENT:  CLINICAL IMPRESSION: Continued with CDT of the left lower extremity remeasuring circumference of Lt LE today.  Measured into exo strong size XL short as she found out she should have 80% coverage with [employer] and is trying to decide if she wants to pay for custom or just do the exostrong.  I also told her she should get a night garment or plan on bandaging over night.  She will think about the cost of things and if she wants to do custom or exostrong.     OBJECTIVE IMPAIRMENTS: decreased knowledge of condition, difficulty walking, increased edema, and pain.   GOALS: Goals reviewed with patient? Yes  SHORT TERM GOALS: Target date: 10/16/22  Pt will demonstrate a 3 cm decrease in edema at  popliteal crease to decrease knee discomfort.  Baseline: Goal status: INITIAL  2.  Pt will demonstrate a 3 cm decrease in edema at 30 cm superior to the floor to decrease risk of infection.  Baseline:  Goal status: INITIAL    LONG TERM GOALS: Target date: 11/06/22  Pt will obtain appropriate compression garment for long term management of edema.  Baseline:  Goal status: INITIAL  2.  Pt will demonstrate a 50% improvement in fibrosis to decrease risk of infection.  Baseline:  Goal status: INITIAL  3.  Pt will report she is able to sleep at night without increased discomfort from her L LE.  Baseline:  Goal status: INITIAL  4.  Pt will be able to independently manage her lymphedema through a compression garment and using the compression pump.  Baseline:  Goal status: INITIAL  5.  Pt will demonstrate a 9 cm reduction in edema at popliteal crease to decrease risk of infection.  Baseline:  Goal status: INITIAL    PLAN:  PT FREQUENCY: 3x/week  PT DURATION: 6 weeks  PLANNED INTERVENTIONS: Therapeutic  exercises, Therapeutic activity, Patient/Family education, Self Care, Orthotic/Fit training, Manual lymph drainage, Compression bandaging, Vasopneumatic device, Manual therapy, and Re-evaluation  PLAN FOR NEXT SESSION: Continue  CDT to LLE, how was foam at lateral malleolus? Any softer here?  Garments: pt will check on DME providers, emailed Kathlee Nations at [employer] to see if we can order there on 10/06/22. Pt self checked and was stated she should have 80% coverage at [facility].  XL short exostrong if pt decides to go this route.  Considering custom vs exostrong (10/16/22)    Stark Bray, PT 10/16/2022, 11:15 AM

## 2022-10-18 ENCOUNTER — Ambulatory Visit: Payer: Medicare HMO

## 2022-10-18 DIAGNOSIS — C55 Malignant neoplasm of uterus, part unspecified: Secondary | ICD-10-CM

## 2022-10-18 DIAGNOSIS — I890 Lymphedema, not elsewhere classified: Secondary | ICD-10-CM

## 2022-10-18 NOTE — Therapy (Signed)
OUTPATIENT PHYSICAL THERAPY ONCOLOGY TREATMENT  Patient Name: Dana [name] MRN: [id number] DOB:03/16/1951, 72 y.o., female Today's Date: 10/18/2022  END OF SESSION:  PT End of Session - 10/18/22 1012     Visit Number 10    Number of Visits 19    Date for PT Re-Evaluation 11/06/22    PT Start Time 1008    PT Stop Time 1106    PT Time Calculation (min) 58 min    Activity Tolerance Patient tolerated treatment well    Behavior During Therapy WFL for tasks assessed/performed                Past Medical History:  Diagnosis Date   Arthritis    GERD (gastroesophageal reflux disease)    History of blood transfusion    Hypothyroidism    Lymphedema    left leg- ted hose makes it worse   Thyroid disease    Uterine cancer    Endometrial cancer - surgery and chemo   Past Surgical History:  Procedure Laterality Date   ABDOMINAL HYSTERECTOMY     APPENDECTOMY     COLONOSCOPY W/ POLYPECTOMY     EYE SURGERY Bilateral    cataracts   IR IMAGING GUIDED PORT INSERTION  05/15/2019   IR REMOVAL TUN ACCESS W/ PORT W/O FL MOD SED  06/23/2020   LIVER BIOPSY     mass removed     2015 from pelvis   TOTAL HIP ARTHROPLASTY Right 07/13/2016   Procedure: RIGHT TOTAL HIP ARTHROPLASTY ANTERIOR APPROACH;  Surgeon: Leandrew Koyanagi, MD;  Location: [facility];  Service: Orthopedics;  Laterality: Right;   Patient Active Problem List   Diagnosis Date Noted   Pancytopenia, acquired 05/16/2021   Superficial thrombophlebitis 05/16/2021   Cellulitis of left leg 03/24/2021   Cancer associated pain 03/03/2021   Immunocompromised patient 04/28/2020   Preventive measure 03/09/2020   Leukopenia due to antineoplastic chemotherapy 03/01/2020   Abdominal pain 10/31/2019   Peripheral neuropathy due to chemotherapy 09/09/2019   Seborrheic keratoses, inflamed 08/15/2019   Other fatigue 07/23/2019   Physical debility 06/10/2019   Nausea with vomiting 05/23/2019   Goals of care, counseling/discussion 05/12/2019    Lymphedema    Secondary malignant neoplasm of inguinal lymph nodes 04/21/2019   Hip joint replacement status 07/13/2016   Primary osteoarthritis of right hip 06/01/2016   Endometrioid adenocarcinoma of uterus 06/03/2015   Diarrhea 06/03/2015    PCP: Shon Baton, MD  REFERRING PROVIDER: Ladell Pier, MD  REFERRING DIAG: C55 (ICD-10-CM) - Endometrioid adenocarcinoma of uterus ([place]) I89.0 (ICD-10-CM) - Lymphedema  THERAPY DIAG:  Lymphedema, not elsewhere classified  Endometrioid adenocarcinoma of uterus  ONSET DATE: 01/05/14  Rationale for Evaluation and Treatment: Rehabilitation  SUBJECTIVE:  SUBJECTIVE STATEMENT:  I think I'll come and get measured by the fitter on 4/10 so we can figure out what to get me.   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 Gross complete resection, tumor involve both ovaries, lymph nodes negative. 6 cycles of adjuvant Taxol/carboplatin. CT abdomen/pelvis 03/28/2019-enlarged right inguinal and iliac nodes. 04/10/2019-right inguinal lymph node biopsy-metastatic adenocarcinoma, ER 90%, PR 95%, PD-L1 combined positive score-1, MSS, tumor mutation burden-4. Cisplatin 05/16/2019 20-1 cycle. Radiation to the lateral pelvis and 1 lymph nodes November 2020. CT 12/72/020-decrease size of right inguinal and external iliac lymph nodes, no evidence of disease progression. Everolimus and letrozole 07/14/2019 - 04/06/2020. CT 10/20/2019-resolution of right iliac lymph node, decrease in size of right inguinal node. CT 03/08/2020-no evidence of recurrent or metastatic disease in the abdomen or pelvis. CT 05/31/2020-no evidence of  recurrent or metastatic disease. CT 03/11/2021-increase right inguinal lymph node, enlargement of bilateral inguinal lymph nodes, subcutaneous soft tissue nodule at the anterior right thigh. Carboplatin 04/06/2021, 04/25/2021, 05/16/2021. CT 07/12/2021-mild decrease in size of bilateral inguinal lymphadenopathy, no progressive disease in the abdomen or pelvis, 0.6 cm left inguinal node was 0.8 cm previously. Letrozole 08/06/2021. CT abdomen/pelvis 04/10/2022-stable right inguinal lymph node, no new site of metastatic disease. CT right femur  04/10/2022 -2.2 x 1 cm anterior subcutaneous mass. Letrozole continued. CT right femur 07/19/2022-mild enlargement of right thigh subcutaneous mass stable right inguinal nodal mass. CTs 07/19/2022-stable right inguinal lymph node, no new or progressive disease. 07/13/16- THA, 01/05/14- total abdominal hysterectomy, neuropathy in bilateral feet    PAIN:  Are you having pain? No pt reports it aches at night  PRECAUTIONS: Other: R THA  WEIGHT BEARING RESTRICTIONS: No  FALLS:  Has patient fallen in last 6 months? No  LIVING ENVIRONMENT: Lives with: lives with their family and lives alone Lives in: House/apartment Stairs: No;  Has following equipment at home: None  OCCUPATION: retired  Psychologist, clinical: goes to gym daily and alternates between treadmill/recumbent bike for 20 min, and does weight machines  PATIENT GOALS: to be able to get some of the fluid out   OBJECTIVE:  LYMPHEDEMA ASSESSMENTS:  SURGERY TYPE/DATE: Total abdominal hysterectomy: Gross complete resection, tumor involved both ovaries, lymph nodes negative 01/05/14 NUMBER OF LYMPH NODES REMOVED: 21 nodes - pt unsure how many were bad CHEMOTHERAPY: completed RADIATION: completed INFECTIONS: 1 infection a few years ago LYMPHEDEMA ASSESSMENTS:   LOWER EXTREMITY LANDMARK RIGHT eval  At groin   30 cm proximal to suprapatella   20 cm proximal to suprapatella 57  10 cm proximal to suprapatella 49  At  midpatella / popliteal crease 38.6  30 cm proximal to floor at lateral plantar foot 38.5  20 cm proximal to floor at lateral plantar foot 29.9  10 cm proximal to floor at lateral plantar foot 21.3  Circumference of ankle/heel   5 cm proximal to 1st MTP joint 21.1  Across MTP joint 21.7  Around proximal great toe 7.4  (Blank rows = not tested)  LOWER EXTREMITY LANDMARK 07/01/2019 LEFT eval 10/06/22 10/12/22 10/18/22  At groin       30 cm proximal to suprapatella       20 cm proximal to suprapatella 58 64 63.2 63.7 63.1  10 cm proximal to suprapatella 49.9 57.7 54 52.8 54.6  At midpatella / popliteal crease 37.8 48 48 42.1 41.8  30 cm  proximal to floor at lateral plantar foot 39.3 51.1 47.6 45 47.8  20 cm proximal to floor at lateral plantar foot 31.4 44.8 41 37.8 39.5  10 cm proximal to floor at lateral plantar foot 22 31 28.5 28.5 28.5  Circumference of ankle/heel       5 cm proximal to 1st MTP joint 21 22.4 22.6 22.9 23.2  Across MTP joint 21.8 23 22.8 22.4 22.6  Around proximal great toe 7.2 7.7 7.7 7.4 7.7  (Blank rows = not tested)  LLIS: 60.29  TODAY'S TREATMENT:                                                                                                                                         DATE:  10/18/22: Manual Therapy Remeasured circumference Manual lymph drainage in supine: short neck, superficial and deep abdominals, Lt axillary nodes, left inguino-axillary anastamoses, left lower extremity from toes and dorsal foot to lateral hip redirecting along. Then in sidelying posterior leg for calf then finished retracing back up Lt LE in supine ending with anastomosis and Lt axillary nodes. Bandaging: Cocoa butter to entire Lt leg then bandaging with Tg soft liner size Med foot to thigh,  rosidal soft foam foot rectangle to dorsal foot and 1/2" gray foam wagon wheel to lateral malleolus fixated with artiflex (artiflex just to foot/ankle) and then rosidal soft from ankle to thigh.   8cm at foot/ankle, 10 cm ankle-knee herring bone fashion, 10cm from ankle to knee spiral, 12 cm spiral from below knee to mid thigh with "X" behind knee, then 12 cm herring bone fashion from knee to groin and 12 cm spiral from shin to near groin, pulled stockinette and pts biker shorts over top of bandage and assisted with donning sock and cast shoe on Lt foot  10/16/22: Manual Therapy Manual lymph drainage in supine: short neck, superficial and deep abdominals, left inguino-axillary anastamoses, left lower extremity from toes and dorsal foot to lateral hip redirecting along. Then in sidelying posterior leg for calf with fibrotic techniques mostly at lateral malleolus (when in supine and S/L) then finished retracing back up Lt LE in supine ending with anastomosis and Lt axillary nodes. Bandaging: Cocoa butter to entire Lt leg then bandaging with Tg soft liner size Med foot to thigh,  rosidal soft foam foot rectangle to dorsal foot and 1/2" gray foam wagon wheel to lateral malleolus fixated with artiflex and then rosidal soft from ankle to thigh.  8cm at foot/ankle, 10 cm ankle-knee herring bone fashion, 10cm from ankle to knee spiral, 12 cm spiral from below knee to mid thigh with "X" behind knee, then 12 cm herring bone fashion from knee to groin and 12 cm spiral from shin to thigh, pulled stockinette and pts biker shorts over top of bandage and assisted with donning sock and cast shoe on Lt foot  10/12/22: Manual Therapy Remeasured circumference of  Lt LE, see above Manual lymph drainage in supine: short neck, superficial and deep abdominals, left inguino-axillary anastamoses, left lower extremity from toes and dorsal foot to lateral hip redirecting along. Then in sidelying posterior leg for calf with fibrotic techniques mostly at lateral malleolus (when in supine and S/L) then finished retracing back up Lt LE in supine ending with anastomosis and Lt axillary nodes. Bandaging: Cocoa butter to entire Lt leg  then bandaging with Tg soft liner size Med foot to thigh,  rosidal soft foam foot rectangle to dorsal foot and 1/2" gray foam wagon wheel to lateral malleolus fixated with artiflex and then rosidal soft from ankle to thigh.  8cm at foot/ankle, 10 cm ankle-knee herring bone fashion, 10cm from ankle to knee spiral, 12 cm spiral from below knee to mid thigh with "X" behind knee, then 12 cm herring bone fashion from knee to groin and 12 cm spiral from shin to thigh, pulled stockinette and pts biker shorts over top of bandage and assisted with donning sock and cast shoe on Lt foot      PATIENT EDUCATION:  Education details: importance of compression during exercise, how compression bandages work with muscle to move fluid Person educated: Patient Education method: Explanation Education comprehension: verbalized understanding  HOME EXERCISE PROGRAM: Continue compression pump  ASSESSMENT:  CLINICAL IMPRESSION: Pt does plan to come to be measured for her garments on 4/10 when fitter is here. She will further discuss exostrong vs custom with rep. Today continued with CDT of Lt LE. Discussed how pt could re-wrap herself at home at night as she notices when she removes her bandages night before session to shower, her circumference is increased and her leg feels tighter. Encouraged pt to try this at home, even if she doesn't use all bandages since it will just be overnight, she could at least start with 8 cm on foot/ankle (explained ASH pattern) and then 1-10 from ankle to knee and 1-12 from knee to thigh. Pt verbalized understanding and will try this soon.    OBJECTIVE IMPAIRMENTS: decreased knowledge of condition, difficulty walking, increased edema, and pain.   GOALS: Goals reviewed with patient? Yes  SHORT TERM GOALS: Target date: 10/16/22  Pt will demonstrate a 3 cm decrease in edema at popliteal crease to decrease knee discomfort.  Baseline: Goal status: INITIAL  2.  Pt will demonstrate a 3 cm  decrease in edema at 30 cm superior to the floor to decrease risk of infection.  Baseline:  Goal status: INITIAL    LONG TERM GOALS: Target date: 11/06/22  Pt will obtain appropriate compression garment for long term management of edema.  Baseline:  Goal status: INITIAL  2.  Pt will demonstrate a 50% improvement in fibrosis to decrease risk of infection.  Baseline:  Goal status: INITIAL  3.  Pt will report she is able to sleep at night without increased discomfort from her L LE.  Baseline:  Goal status: INITIAL  4.  Pt will be able to independently manage her lymphedema through a compression garment and using the compression pump.  Baseline:  Goal status: INITIAL  5.  Pt will demonstrate a 9 cm reduction in edema at popliteal crease to decrease risk of infection.  Baseline:  Goal status: INITIAL    PLAN:  PT FREQUENCY: 3x/week  PT DURATION: 6 weeks  PLANNED INTERVENTIONS: Therapeutic exercises, Therapeutic activity, Patient/Family education, Self Care, Orthotic/Fit training, Manual lymph drainage, Compression bandaging, Vasopneumatic device, Manual therapy, and Re-evaluation  PLAN FOR NEXT  SESSION: Continue  CDT to LLE, pt to be measured for compression garments next week (4/10), discuss/answer questions prn about compression until then Garments: pt will check on DME providers, emailed Kathlee Nations at [facility] to see if we can order there on 10/06/22. Pt self checked and was stated she should have 80% coverage at [facility].  XL short exostrong if pt decides to go this route.  Considering custom vs exostrong (10/16/22)    Otelia Limes, PTA 10/18/2022, 12:20 PM

## 2022-10-20 ENCOUNTER — Ambulatory Visit: Payer: Medicare HMO | Admitting: Rehabilitation

## 2022-10-20 ENCOUNTER — Encounter: Payer: Self-pay | Admitting: Rehabilitation

## 2022-10-20 DIAGNOSIS — C55 Malignant neoplasm of uterus, part unspecified: Secondary | ICD-10-CM

## 2022-10-20 DIAGNOSIS — I890 Lymphedema, not elsewhere classified: Secondary | ICD-10-CM

## 2022-10-20 NOTE — Therapy (Signed)
OUTPATIENT PHYSICAL THERAPY ONCOLOGY TREATMENT  Patient Name: Dana [name] MRN: [id number] DOB:[birth year], 72 y.o., female Today's Date: 10/20/2022  END OF SESSION:  PT End of Session - 10/20/22 1057     Visit Number 11    Number of Visits 19    Date for PT Re-Evaluation 11/06/22    PT Start Time 1100    PT Stop Time 1200    PT Time Calculation (min) 60 min    Activity Tolerance Patient tolerated treatment well    Behavior During Therapy WFL for tasks assessed/performed                Past Medical History:  Diagnosis Date   Arthritis    GERD (gastroesophageal reflux disease)    History of blood transfusion    Hypothyroidism    Lymphedema    left leg- ted hose makes it worse   Thyroid disease    Uterine cancer    Endometrial cancer - surgery and chemo   Past Surgical History:  Procedure Laterality Date   ABDOMINAL HYSTERECTOMY     APPENDECTOMY     COLONOSCOPY W/ POLYPECTOMY     EYE SURGERY Bilateral    cataracts   IR IMAGING GUIDED PORT INSERTION  05/15/2019   IR REMOVAL TUN ACCESS W/ PORT W/O FL MOD SED  06/23/2020   LIVER BIOPSY     mass removed     2015 from pelvis   TOTAL HIP ARTHROPLASTY Right 07/13/2016   Procedure: RIGHT TOTAL HIP ARTHROPLASTY ANTERIOR APPROACH;  Surgeon: Tarry Kos, MD;  Location: [facility] OR;  Service: Orthopedics;  Laterality: Right;   Patient Active Problem List   Diagnosis Date Noted   Pancytopenia, acquired 05/16/2021   Superficial thrombophlebitis 05/16/2021   Cellulitis of left leg 03/24/2021   Cancer associated pain 03/03/2021   Immunocompromised patient 04/28/2020   Preventive measure 03/09/2020   Leukopenia due to antineoplastic chemotherapy 03/01/2020   Abdominal pain 10/31/2019   Peripheral neuropathy due to chemotherapy 09/09/2019   Seborrheic keratoses, inflamed 08/15/2019   Other fatigue 07/23/2019   Physical debility 06/10/2019   Nausea with vomiting 05/23/2019   Goals of care, counseling/discussion 05/12/2019    Lymphedema    Secondary malignant neoplasm of inguinal lymph nodes 04/21/2019   Hip joint replacement status 07/13/2016   Primary osteoarthritis of right hip 06/01/2016   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 Gross complete resection, tumor involve both ovaries, lymph nodes negative. 6 cycles of adjuvant Taxol/carboplatin. CT abdomen/pelvis 03/28/2019-enlarged right inguinal and iliac nodes. 04/10/2019-right inguinal lymph node biopsy-metastatic adenocarcinoma, ER 90%, PR 95%, PD-L1 combined positive score-1, MSS, tumor mutation burden-4. Cisplatin 05/16/2019 20-1 cycle. Radiation to the lateral pelvis and 1 lymph nodes November 2020. CT 12/72/020-decrease size of right inguinal and external iliac lymph nodes, no evidence of disease progression. Everolimus and letrozole 07/14/2019 - 04/06/2020. CT 10/20/2019-resolution of right iliac lymph node, decrease in size of right inguinal node. CT 03/08/2020-no evidence of recurrent or metastatic disease in the abdomen or pelvis. CT 05/31/2020-no evidence of recurrent or metastatic disease. CT  03/11/2021-increase right inguinal lymph node, enlargement of bilateral inguinal lymph nodes, subcutaneous soft tissue nodule at the anterior right thigh. Carboplatin 04/06/2021, 04/25/2021, 05/16/2021. CT 07/12/2021-mild decrease in size of bilateral inguinal lymphadenopathy, no progressive disease in the abdomen or pelvis, 0.6 cm left inguinal node was 0.8 cm previously. Letrozole 08/06/2021. CT abdomen/pelvis 04/10/2022-stable right inguinal lymph node, no new site of metastatic disease. CT right femur  04/10/2022 -2.2 x 1 cm anterior subcutaneous mass. Letrozole continued. CT right femur 07/19/2022-mild enlargement of right thigh subcutaneous mass stable right inguinal nodal mass. CTs 07/19/2022-stable right inguinal lymph node, no new or progressive disease. 07/13/16- THA, 01/05/14- total abdominal hysterectomy, neuropathy in bilateral feet    PAIN:  Are you having pain? No pt reports it aches at night  PRECAUTIONS: Other: R THA  WEIGHT BEARING RESTRICTIONS: No  FALLS:  Has patient fallen in last 6 months? 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 and alternates between treadmill/recumbent bike for 20 min, and does weight machines  PATIENT GOALS: to be able to get some of the fluid out   OBJECTIVE:  LYMPHEDEMA ASSESSMENTS:  SURGERY TYPE/DATE: Total abdominal hysterectomy: Gross complete resection, tumor involved both ovaries, lymph nodes negative 01/05/14 NUMBER OF LYMPH NODES REMOVED: 21 nodes - pt unsure how many were bad CHEMOTHERAPY: completed RADIATION: completed INFECTIONS: 1 infection a few years ago LYMPHEDEMA ASSESSMENTS:   LOWER EXTREMITY LANDMARK RIGHT eval  At groin   30 cm proximal to suprapatella   20 cm proximal to suprapatella 57  10 cm proximal to suprapatella 49  At midpatella / popliteal crease 38.6  30  cm proximal to floor at lateral plantar foot 38.5  20 cm proximal to floor at lateral plantar foot 29.9  10 cm proximal to floor at lateral plantar foot 21.3  Circumference of ankle/heel   5 cm proximal to 1st MTP joint 21.1  Across MTP joint 21.7  Around proximal great toe 7.4  (Blank rows = not tested)  LOWER EXTREMITY LANDMARK 07/01/2019 LEFT eval 10/06/22 10/12/22 10/18/22  At groin       30 cm proximal to suprapatella       20 cm proximal to suprapatella 58 64 63.2 63.7 63.1  10 cm proximal to suprapatella 49.9 57.7 54 52.8 54.6  At midpatella / popliteal crease 37.8 48 48 42.1 41.8  30 cm proximal to floor at lateral plantar foot  39.3 51.1 47.6 45 47.8  20 cm proximal to floor at lateral plantar foot 31.4 44.8 41 37.8 39.5  10 cm proximal to floor at lateral plantar foot 22 31 28.5 28.5 28.5  Circumference of ankle/heel       5 cm proximal to 1st MTP joint 21 22.4 22.6 22.9 23.2  Across MTP joint 21.8 23 22.8 22.4 22.6  Around proximal great toe 7.2 7.7 7.7 7.4 7.7  (Blank rows = not tested)  LLIS: 60.29  TODAY'S TREATMENT:                                                                                                                                         DATE:  10/20/22: Manual Therapy Manual lymph drainage in supine: short neck, superficial and deep abdominals, Lt axillary nodes, left inguino-axillary anastamoses, left lower extremity from toes and dorsal foot to lateral hip redirecting along. Then in sidelying posterior leg for calf then finished retracing back up Lt LE in supine ending with anastomosis and Lt axillary nodes. Bandaging: Cocoa butter to entire Lt leg then bandaging with Tg soft liner size Med foot to thigh,  rosidal soft foam foot rectangle to dorsal foot and 1/2" gray foam wagon wheel to lateral malleolus fixated with artiflex (artiflex just to foot/ankle) and then rosidal soft from ankle to thigh.  8cm at foot/ankle, 10 cm ankle-knee herring bone fashion, 10cm  from ankle to knee spiral, 12 cm spiral from below knee to mid thigh with "X" behind knee, then 12 cm herring bone fashion from knee to groin and 12 cm spiral from shin to near groin, pulled stockinette and pts biker shorts over top of bandage and assisted with donning sock and cast shoe on Lt foot  10/18/22: Manual Therapy Remeasured circumference Manual lymph drainage in supine: short neck, superficial and deep abdominals, Lt axillary nodes, left inguino-axillary anastamoses, left lower extremity from toes and dorsal foot to lateral hip redirecting along. Then in sidelying posterior leg for calf then finished retracing back up Lt LE in supine ending with anastomosis and Lt axillary nodes. Bandaging: Cocoa butter to entire Lt leg then bandaging with Tg soft liner size Med foot to thigh,  rosidal soft foam foot rectangle to dorsal foot and 1/2" gray foam wagon wheel to lateral malleolus fixated with artiflex (artiflex just to foot/ankle) and then rosidal soft from ankle to thigh.  8cm at foot/ankle, 10 cm ankle-knee herring bone fashion, 10cm from ankle to knee spiral, 12 cm spiral from below knee to mid thigh with "X" behind knee, then 12 cm herring bone fashion from knee to groin and 12 cm spiral from shin to near groin, pulled stockinette and pts biker shorts over top of bandage and assisted with donning sock and cast shoe on Lt foot  10/16/22: Manual Therapy Manual lymph drainage in supine: short neck, superficial and deep abdominals, left inguino-axillary anastamoses,  left lower extremity from toes and dorsal foot to lateral hip redirecting along. Then in sidelying posterior leg for calf with fibrotic techniques mostly at lateral malleolus (when in supine and S/L) then finished retracing back up Lt LE in supine ending with anastomosis and Lt axillary nodes. Bandaging: Cocoa butter to entire Lt leg then bandaging with Tg soft liner size Med foot to thigh,  rosidal soft foam foot rectangle to dorsal foot  and 1/2" gray foam wagon wheel to lateral malleolus fixated with artiflex and then rosidal soft from ankle to thigh.  8cm at foot/ankle, 10 cm ankle-knee herring bone fashion, 10cm from ankle to knee spiral, 12 cm spiral from below knee to mid thigh with "X" behind knee, then 12 cm herring bone fashion from knee to groin and 12 cm spiral from shin to thigh, pulled stockinette and pts biker shorts over top of bandage and assisted with donning sock and cast shoe on Lt foot  10/12/22: Manual Therapy Remeasured circumference of Lt LE, see above Manual lymph drainage in supine: short neck, superficial and deep abdominals, left inguino-axillary anastamoses, left lower extremity from toes and dorsal foot to lateral hip redirecting along. Then in sidelying posterior leg for calf with fibrotic techniques mostly at lateral malleolus (when in supine and S/L) then finished retracing back up Lt LE in supine ending with anastomosis and Lt axillary nodes. Bandaging: Cocoa butter to entire Lt leg then bandaging with Tg soft liner size Med foot to thigh,  rosidal soft foam foot rectangle to dorsal foot and 1/2" gray foam wagon wheel to lateral malleolus fixated with artiflex and then rosidal soft from ankle to thigh.  8cm at foot/ankle, 10 cm ankle-knee herring bone fashion, 10cm from ankle to knee spiral, 12 cm spiral from below knee to mid thigh with "X" behind knee, then 12 cm herring bone fashion from knee to groin and 12 cm spiral from shin to thigh, pulled stockinette and pts biker shorts over top of bandage and assisted with donning sock and cast shoe on Lt foot      PATIENT EDUCATION:  Education details: importance of compression during exercise, how compression bandages work with muscle to move fluid Person educated: Patient Education method: Explanation Education comprehension: verbalized understanding  HOME EXERCISE PROGRAM: Continue compression pump  ASSESSMENT:  CLINICAL IMPRESSION: Pt knows that  custom is better for her but also discussed how she could get something quicker if it was OTS as she is concerned about the 6-8week turn around time with garments and how many more visits she would need.  Also the 20% cost required for day and night custom vs non.  We also discussed that depending on how ind she is with her pump and bandaging at home she could do less in person visits.  She may benefit from trouble shooting and practice on self bandaging.   OBJECTIVE IMPAIRMENTS: decreased knowledge of condition, difficulty walking, increased edema, and pain.   GOALS: Goals reviewed with patient? Yes  SHORT TERM GOALS: Target date: 10/16/22  Pt will demonstrate a 3 cm decrease in edema at popliteal crease to decrease knee discomfort.  Baseline: Goal status: INITIAL  2.  Pt will demonstrate a 3 cm decrease in edema at 30 cm superior to the floor to decrease risk of infection.  Baseline:  Goal status: INITIAL    LONG TERM GOALS: Target date: 11/06/22  Pt will obtain appropriate compression garment for long term management of edema.  Baseline:  Goal status: INITIAL  2.  Pt will demonstrate a 50% improvement in fibrosis to decrease risk of infection.  Baseline:  Goal status: INITIAL  3.  Pt will report she is able to sleep at night without increased discomfort from her L LE.  Baseline:  Goal status: INITIAL  4.  Pt will be able to independently manage her lymphedema through a compression garment and using the compression pump.  Baseline:  Goal status: INITIAL  5.  Pt will demonstrate a 9 cm reduction in edema at popliteal crease to decrease risk of infection.  Baseline:  Goal status: INITIAL    PLAN:  PT FREQUENCY: 3x/week  PT DURATION: 6 weeks  PLANNED INTERVENTIONS: Therapeutic exercises, Therapeutic activity, Patient/Family education, Self Care, Orthotic/Fit training, Manual lymph drainage, Compression bandaging, Vasopneumatic device, Manual therapy, and  Re-evaluation  PLAN FOR NEXT SESSION: Continue  CDT to LLE, pt to be measured for compression garments next week (4/10) but still unsure about custom vs exostrong and night due to costs.   Garments: pt will check on DME providers, emailed Marisue Ivan at [facility] to see if we can order there on 10/06/22. Pt self checked and was stated she should have 80% coverage at [facility].  XL short exostrong if pt decides to go this route.  Considering custom vs exostrong (10/16/22)    Idamae Lusher, PT 10/20/2022, 12:06 PM

## 2022-10-23 ENCOUNTER — Ambulatory Visit: Payer: Medicare HMO | Admitting: Rehabilitation

## 2022-10-23 DIAGNOSIS — I890 Lymphedema, not elsewhere classified: Secondary | ICD-10-CM

## 2022-10-23 DIAGNOSIS — C55 Malignant neoplasm of uterus, part unspecified: Secondary | ICD-10-CM | POA: Diagnosis not present

## 2022-10-23 NOTE — Therapy (Signed)
OUTPATIENT PHYSICAL THERAPY ONCOLOGY TREATMENT  Patient Name: Dana [name] MRN: [id number] DOB:1950-10-27, 72 y.o., female Today's Date: 10/23/2022  END OF SESSION:  PT End of Session - 10/23/22 2102     Visit Number 12    Number of Visits 19    Date for PT Re-Evaluation 11/06/22    PT Start Time 1104    PT Stop Time 1200    PT Time Calculation (min) 56 min    Activity Tolerance Patient tolerated treatment well    Behavior During Therapy WFL for tasks assessed/performed                 Past Medical History:  Diagnosis Date   Arthritis    GERD (gastroesophageal reflux disease)    History of blood transfusion    Hypothyroidism    Lymphedema    left leg- ted hose makes it worse   Thyroid disease    Uterine cancer    Endometrial cancer - surgery and chemo   Past Surgical History:  Procedure Laterality Date   ABDOMINAL HYSTERECTOMY     APPENDECTOMY     COLONOSCOPY W/ POLYPECTOMY     EYE SURGERY Bilateral    cataracts   IR IMAGING GUIDED PORT INSERTION  05/15/2019   IR REMOVAL TUN ACCESS W/ PORT W/O FL MOD SED  06/23/2020   LIVER BIOPSY     mass removed     2015 from pelvis   TOTAL HIP ARTHROPLASTY Right 07/13/2016   Procedure: RIGHT TOTAL HIP ARTHROPLASTY ANTERIOR APPROACH;  Surgeon: Tarry Kos, MD;  Location: [facility] OR;  Service: Orthopedics;  Laterality: Right;   Patient Active Problem List   Diagnosis Date Noted   Pancytopenia, acquired 05/16/2021   Superficial thrombophlebitis 05/16/2021   Cellulitis of left leg 03/24/2021   Cancer associated pain 03/03/2021   Immunocompromised patient 04/28/2020   Preventive measure 03/09/2020   Leukopenia due to antineoplastic chemotherapy 03/01/2020   Abdominal pain 10/31/2019   Peripheral neuropathy due to chemotherapy 09/09/2019   Seborrheic keratoses, inflamed 08/15/2019   Other fatigue 07/23/2019   Physical debility 06/10/2019   Nausea with vomiting 05/23/2019   Goals of care, counseling/discussion 05/12/2019    Lymphedema    Secondary malignant neoplasm of inguinal lymph nodes 04/21/2019   Hip joint replacement status 07/13/2016   Primary osteoarthritis of right hip 06/01/2016   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 Gross complete resection, tumor involve both ovaries, lymph nodes negative. 6 cycles of adjuvant Taxol/carboplatin. CT abdomen/pelvis 03/28/2019-enlarged right inguinal and iliac nodes. 04/10/2019-right inguinal lymph node biopsy-metastatic adenocarcinoma, ER 90%, PR 95%, PD-L1 combined positive score-1, MSS, tumor mutation burden-4. Cisplatin 05/16/2019 20-1 cycle. Radiation to the lateral pelvis and 1 lymph nodes November 2020. CT 12/72/020-decrease size of right inguinal and external iliac lymph nodes, no evidence of disease progression. Everolimus and letrozole 07/14/2019 - 04/06/2020. CT 10/20/2019-resolution of right iliac lymph node, decrease in size of right inguinal node. CT 03/08/2020-no evidence of recurrent or metastatic disease in the abdomen or pelvis. CT 05/31/2020-no evidence of recurrent or metastatic disease. CT  03/11/2021-increase right inguinal lymph node, enlargement of bilateral inguinal lymph nodes, subcutaneous soft tissue nodule at the anterior right thigh. Carboplatin 04/06/2021, 04/25/2021, 05/16/2021. CT 07/12/2021-mild decrease in size of bilateral inguinal lymphadenopathy, no progressive disease in the abdomen or pelvis, 0.6 cm left inguinal node was 0.8 cm previously. Letrozole 08/06/2021. CT abdomen/pelvis 04/10/2022-stable right inguinal lymph node, no new site of metastatic disease. CT right femur  04/10/2022 -2.2 x 1 cm anterior subcutaneous mass. Letrozole continued. CT right femur 07/19/2022-mild enlargement of right thigh subcutaneous mass stable right inguinal nodal mass. CTs 07/19/2022-stable right inguinal lymph node, no new or progressive disease. 07/13/16- THA, 01/05/14- total abdominal hysterectomy, neuropathy in bilateral feet    PAIN:  Are you having pain? No pt reports it aches at night  PRECAUTIONS: Other: R THA  WEIGHT BEARING RESTRICTIONS: No  FALLS:  Has patient fallen in last 6 months? 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 and alternates between treadmill/recumbent bike for 20 min, and does weight machines  PATIENT GOALS: to be able to get some of the fluid out   OBJECTIVE:  LYMPHEDEMA ASSESSMENTS:  SURGERY TYPE/DATE: Total abdominal hysterectomy: Gross complete resection, tumor involved both ovaries, lymph nodes negative 01/05/14 NUMBER OF LYMPH NODES REMOVED: 21 nodes - pt unsure how many were bad CHEMOTHERAPY: completed RADIATION: completed INFECTIONS: 1 infection a few years ago LYMPHEDEMA ASSESSMENTS:   LOWER EXTREMITY LANDMARK RIGHT eval  At groin   30 cm proximal to suprapatella   20 cm proximal to suprapatella 57  10 cm proximal to suprapatella 49  At midpatella / popliteal crease 38.6  30  cm proximal to floor at lateral plantar foot 38.5  20 cm proximal to floor at lateral plantar foot 29.9  10 cm proximal to floor at lateral plantar foot 21.3  Circumference of ankle/heel   5 cm proximal to 1st MTP joint 21.1  Across MTP joint 21.7  Around proximal great toe 7.4  (Blank rows = not tested)  LOWER EXTREMITY LANDMARK 07/01/2019 LEFT eval 10/06/22 10/12/22 10/18/22  At groin       30 cm proximal to suprapatella       20 cm proximal to suprapatella 58 64 63.2 63.7 63.1  10 cm proximal to suprapatella 49.9 57.7 54 52.8 54.6  At midpatella / popliteal crease 37.8 48 48 42.1 41.8  30 cm proximal to floor at lateral plantar foot  39.3 51.1 47.6 45 47.8  20 cm proximal to floor at lateral plantar foot 31.4 44.8 41 37.8 39.5  10 cm proximal to floor at lateral plantar foot 22 31 28.5 28.5 28.5  Circumference of ankle/heel       5 cm proximal to 1st MTP joint 21 22.4 22.6 22.9 23.2  Across MTP joint 21.8 23 22.8 22.4 22.6  Around proximal great toe 7.2 7.7 7.7 7.4 7.7  (Blank rows = not tested)  LLIS: 60.29  TODAY'S TREATMENT:                                                                                                                                         DATE:   10/23/22: Manual Therapy Manual lymph drainage in supine: short neck, superficial and deep abdominals, Lt axillary nodes, left inguino-axillary anastamoses, left lower extremity from toes and dorsal foot to lateral hip redirecting along. Then in sidelying posterior leg for calf then finished retracing back up Lt LE in supine ending with anastomosis and Lt axillary nodes. Bandaging: Cocoa butter to entire Lt leg then bandaging with Tg soft liner size Med foot to thigh,  rosidal soft foam foot rectangle to dorsal foot and 1/2" gray foam wagon wheel to lateral malleolus fixated with artiflex (artiflex just to foot/ankle) and then rosidal soft from ankle to thigh.  8cm at foot/ankle, 10 cm ankle-knee herring bone fashion, 10cm  from ankle to knee spiral, 12 cm spiral from below knee to mid thigh with "X" behind knee, then 12 cm herring bone fashion from knee to groin and 12 cm spiral from shin to near groin, pulled stockinette and pts biker shorts over top of bandage and assisted with donning sock and cast shoe on Lt foot Gave pt ordering info for exostrong as she is still deciding if she wants to pay for off the shelf to get it faster and then order custom/night separately.  10/20/22: Manual Therapy Manual lymph drainage in supine: short neck, superficial and deep abdominals, Lt axillary nodes, left inguino-axillary anastamoses, left lower extremity from toes and dorsal foot to lateral hip redirecting along. Then in sidelying posterior leg for calf then finished retracing back up Lt LE in supine ending with anastomosis and Lt axillary nodes. Bandaging: Cocoa butter to entire Lt leg then bandaging with Tg soft liner size Med foot to thigh,  rosidal soft foam foot rectangle to dorsal foot and 1/2" gray foam wagon wheel to lateral malleolus fixated with artiflex (artiflex just to foot/ankle) and then rosidal soft from ankle to thigh.  8cm at foot/ankle, 10 cm ankle-knee herring bone fashion, 10cm from ankle to knee spiral, 12 cm spiral from below knee to mid thigh with "X" behind knee, then 12 cm herring bone fashion from knee to groin and 12 cm spiral from shin to near groin, pulled stockinette and pts biker shorts over top of bandage and  assisted with donning sock and cast shoe on Lt foot  10/18/22: Manual Therapy Remeasured circumference Manual lymph drainage in supine: short neck, superficial and deep abdominals, Lt axillary nodes, left inguino-axillary anastamoses, left lower extremity from toes and dorsal foot to lateral hip redirecting along. Then in sidelying posterior leg for calf then finished retracing back up Lt LE in supine ending with anastomosis and Lt axillary nodes. Bandaging: Cocoa butter to entire Lt leg then  bandaging with Tg soft liner size Med foot to thigh,  rosidal soft foam foot rectangle to dorsal foot and 1/2" gray foam wagon wheel to lateral malleolus fixated with artiflex (artiflex just to foot/ankle) and then rosidal soft from ankle to thigh.  8cm at foot/ankle, 10 cm ankle-knee herring bone fashion, 10cm from ankle to knee spiral, 12 cm spiral from below knee to mid thigh with "X" behind knee, then 12 cm herring bone fashion from knee to groin and 12 cm spiral from shin to near groin, pulled stockinette and pts biker shorts over top of bandage and assisted with donning sock and cast shoe on Lt foot  10/16/22: Manual Therapy Manual lymph drainage in supine: short neck, superficial and deep abdominals, left inguino-axillary anastamoses, left lower extremity from toes and dorsal foot to lateral hip redirecting along. Then in sidelying posterior leg for calf with fibrotic techniques mostly at lateral malleolus (when in supine and S/L) then finished retracing back up Lt LE in supine ending with anastomosis and Lt axillary nodes. Bandaging: Cocoa butter to entire Lt leg then bandaging with Tg soft liner size Med foot to thigh,  rosidal soft foam foot rectangle to dorsal foot and 1/2" gray foam wagon wheel to lateral malleolus fixated with artiflex and then rosidal soft from ankle to thigh.  8cm at foot/ankle, 10 cm ankle-knee herring bone fashion, 10cm from ankle to knee spiral, 12 cm spiral from below knee to mid thigh with "X" behind knee, then 12 cm herring bone fashion from knee to groin and 12 cm spiral from shin to thigh, pulled stockinette and pts biker shorts over top of bandage and assisted with donning sock and cast shoe on Lt foot  10/12/22: Manual Therapy Remeasured circumference of Lt LE, see above Manual lymph drainage in supine: short neck, superficial and deep abdominals, left inguino-axillary anastamoses, left lower extremity from toes and dorsal foot to lateral hip redirecting along. Then  in sidelying posterior leg for calf with fibrotic techniques mostly at lateral malleolus (when in supine and S/L) then finished retracing back up Lt LE in supine ending with anastomosis and Lt axillary nodes. Bandaging: Cocoa butter to entire Lt leg then bandaging with Tg soft liner size Med foot to thigh,  rosidal soft foam foot rectangle to dorsal foot and 1/2" gray foam wagon wheel to lateral malleolus fixated with artiflex and then rosidal soft from ankle to thigh.  8cm at foot/ankle, 10 cm ankle-knee herring bone fashion, 10cm from ankle to knee spiral, 12 cm spiral from below knee to mid thigh with "X" behind knee, then 12 cm herring bone fashion from knee to groin and 12 cm spiral from shin to thigh, pulled stockinette and pts biker shorts over top of bandage and assisted with donning sock and cast shoe on Lt foot      PATIENT EDUCATION:  Education details: importance of compression during exercise, how compression bandages work with muscle to move fluid Person educated: Patient Education method: Explanation Education comprehension: verbalized understanding  HOME EXERCISE PROGRAM: Continue compression  pump  ASSESSMENT:  CLINICAL IMPRESSION: Pt knows that custom is better for her but also discussed how she could get something quicker if it was OTS as she is concerned about the 6-8week turn around time with garments and how many more visits she would need.  Also the 20% cost required for day and night custom vs non.  We also discussed that depending on how ind she is with her pump and bandaging at home she could do less in person visits.  She may benefit from trouble shooting and practice on self bandaging.   OBJECTIVE IMPAIRMENTS: decreased knowledge of condition, difficulty walking, increased edema, and pain.   GOALS: Goals reviewed with patient? Yes  SHORT TERM GOALS: Target date: 10/16/22  Pt will demonstrate a 3 cm decrease in edema at popliteal crease to decrease knee discomfort.   Baseline: Goal status: INITIAL  2.  Pt will demonstrate a 3 cm decrease in edema at 30 cm superior to the floor to decrease risk of infection.  Baseline:  Goal status: INITIAL    LONG TERM GOALS: Target date: 11/06/22  Pt will obtain appropriate compression garment for long term management of edema.  Baseline:  Goal status: INITIAL  2.  Pt will demonstrate a 50% improvement in fibrosis to decrease risk of infection.  Baseline:  Goal status: INITIAL  3.  Pt will report she is able to sleep at night without increased discomfort from her L LE.  Baseline:  Goal status: INITIAL  4.  Pt will be able to independently manage her lymphedema through a compression garment and using the compression pump.  Baseline:  Goal status: INITIAL  5.  Pt will demonstrate a 9 cm reduction in edema at popliteal crease to decrease risk of infection.  Baseline:  Goal status: INITIAL    PLAN:  PT FREQUENCY: 3x/week  PT DURATION: 6 weeks  PLANNED INTERVENTIONS: Therapeutic exercises, Therapeutic activity, Patient/Family education, Self Care, Orthotic/Fit training, Manual lymph drainage, Compression bandaging, Vasopneumatic device, Manual therapy, and Re-evaluation  PLAN FOR NEXT SESSION: Continue  CDT to LLE, pt to be measured for compression garments next week (4/10) but still unsure about custom vs exostrong and night due to costs.   Garments: pt will check on DME providers, emailed Marisue Ivan at [facility] to see if we can order there on 10/06/22. Pt self checked and was stated she should have 80% coverage at [facility].  XL short exostrong if pt decides to go this route.  Considering custom vs exostrong (10/16/22)    Idamae Lusher, PT 10/23/2022, 9:03 PM

## 2022-10-25 ENCOUNTER — Ambulatory Visit: Payer: Medicare HMO | Admitting: Rehabilitation

## 2022-10-25 ENCOUNTER — Encounter: Payer: Self-pay | Admitting: Rehabilitation

## 2022-10-25 DIAGNOSIS — C55 Malignant neoplasm of uterus, part unspecified: Secondary | ICD-10-CM

## 2022-10-25 DIAGNOSIS — I890 Lymphedema, not elsewhere classified: Secondary | ICD-10-CM

## 2022-10-25 NOTE — Therapy (Addendum)
OUTPATIENT PHYSICAL THERAPY ONCOLOGY TREATMENT  Patient Name: HLI RENT MRN: [id number] DOB:01-07-1951, 72 y.o., female Today's Date: 10/25/2022  END OF SESSION:  PT End of Session - 10/25/22 1357     Visit Number 13    Number of Visits 19    Date for PT Re-Evaluation 11/06/22    PT Start Time 1400    PT Stop Time 1455    PT Time Calculation (min) 55 min    Activity Tolerance Patient tolerated treatment well    Behavior During Therapy WFL for tasks assessed/performed                 Past Medical History:  Diagnosis Date   Arthritis    GERD (gastroesophageal reflux disease)    History of blood transfusion    Hypothyroidism    Lymphedema    left leg- ted hose makes it worse   Thyroid disease    Uterine cancer    Endometrial cancer - surgery and chemo   Past Surgical History:  Procedure Laterality Date   ABDOMINAL HYSTERECTOMY     APPENDECTOMY     COLONOSCOPY W/ POLYPECTOMY     EYE SURGERY Bilateral    cataracts   IR IMAGING GUIDED PORT INSERTION  05/15/2019   IR REMOVAL TUN ACCESS W/ PORT W/O FL MOD SED  06/23/2020   LIVER BIOPSY     mass removed     2015 from pelvis   TOTAL HIP ARTHROPLASTY Right 07/13/2016   Procedure: RIGHT TOTAL HIP ARTHROPLASTY ANTERIOR APPROACH;  Surgeon: Tarry Kos, MD;  Location: [facility] OR;  Service: Orthopedics;  Laterality: Right;   Patient Active Problem List   Diagnosis Date Noted   Pancytopenia, acquired 05/16/2021   Superficial thrombophlebitis 05/16/2021   Cellulitis of left leg 03/24/2021   Cancer associated pain 03/03/2021   Immunocompromised patient 04/28/2020   Preventive measure 03/09/2020   Leukopenia due to antineoplastic chemotherapy 03/01/2020   Abdominal pain 10/31/2019   Peripheral neuropathy due to chemotherapy 09/09/2019   Seborrheic keratoses, inflamed 08/15/2019   Other fatigue 07/23/2019   Physical debility 06/10/2019   Nausea with vomiting 05/23/2019   Goals of care, counseling/discussion 05/12/2019    Lymphedema    Secondary malignant neoplasm of inguinal lymph nodes 04/21/2019   Hip joint replacement status 07/13/2016   Primary osteoarthritis of right hip 06/01/2016   Endometrioid adenocarcinoma of uterus 06/03/2015   Diarrhea 06/03/2015    PCP: Creola Corn, MD  REFERRING PROVIDER: Ladene Artist, MD  REFERRING DIAG: C55 (ICD-10-CM) - Endometrioid adenocarcinoma of uterus (HCC) I89.0 (ICD-10-CM) - Lymphedema  THERAPY DIAG:  Lymphedema, not elsewhere classified  Endometrioid adenocarcinoma of uterus  ONSET DATE: 01/05/14  Rationale for Evaluation and Treatment: Rehabilitation  SUBJECTIVE:  SUBJECTIVE STATEMENT:   I ordered that stocking online.  I think I do want to get the night garment.   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 Gross complete resection, tumor involve both ovaries, lymph nodes negative. 6 cycles of adjuvant Taxol/carboplatin. CT abdomen/pelvis 03/28/2019-enlarged right inguinal and iliac nodes. 04/10/2019-right inguinal lymph node biopsy-metastatic adenocarcinoma, ER 90%, PR 95%, PD-L1 combined positive score-1, MSS, tumor mutation burden-4. Cisplatin 05/16/2019 20-1 cycle. Radiation to the lateral pelvis and 1 lymph nodes November 2020. CT 12/72/020-decrease size of right inguinal and external iliac lymph nodes, no evidence of disease progression. Everolimus and letrozole 07/14/2019 - 04/06/2020. CT 10/20/2019-resolution of right iliac lymph node, decrease in size of right inguinal node. CT 03/08/2020-no evidence of recurrent or metastatic disease in the abdomen or pelvis. CT 05/31/2020-no evidence of recurrent or  metastatic disease. CT 03/11/2021-increase right inguinal lymph node, enlargement of bilateral inguinal lymph nodes, subcutaneous soft tissue nodule at the anterior right thigh. Carboplatin 04/06/2021, 04/25/2021, 05/16/2021. CT 07/12/2021-mild decrease in size of bilateral inguinal lymphadenopathy, no progressive disease in the abdomen or pelvis, 0.6 cm left inguinal node was 0.8 cm previously. Letrozole 08/06/2021. CT abdomen/pelvis 04/10/2022-stable right inguinal lymph node, no new site of metastatic disease. CT right femur  04/10/2022 -2.2 x 1 cm anterior subcutaneous mass. Letrozole continued. CT right femur 07/19/2022-mild enlargement of right thigh subcutaneous mass stable right inguinal nodal mass. CTs 07/19/2022-stable right inguinal lymph node, no new or progressive disease. 07/13/16- THA, 01/05/14- total abdominal hysterectomy, neuropathy in bilateral feet    PAIN:  Are you having pain? No pt reports it aches at night  PRECAUTIONS: Other: R THA  WEIGHT BEARING RESTRICTIONS: No  FALLS:  Has patient fallen in last 6 months? 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 and alternates between treadmill/recumbent bike for 20 min, and does weight machines  PATIENT GOALS: to be able to get some of the fluid out   OBJECTIVE:  LYMPHEDEMA ASSESSMENTS:  SURGERY TYPE/DATE: Total abdominal hysterectomy: Gross complete resection, tumor involved both ovaries, lymph nodes negative 01/05/14 NUMBER OF LYMPH NODES REMOVED: 21 nodes - pt unsure how many were bad CHEMOTHERAPY: completed RADIATION: completed INFECTIONS: 1 infection a few years ago LYMPHEDEMA ASSESSMENTS:   LOWER EXTREMITY LANDMARK RIGHT eval  At groin   30 cm proximal to suprapatella   20 cm proximal to suprapatella 57  10 cm proximal to suprapatella 49  At midpatella /  popliteal crease 38.6  30 cm proximal to floor at lateral plantar foot 38.5  20 cm proximal to floor at lateral plantar foot 29.9  10 cm proximal to floor at lateral plantar foot 21.3  Circumference of ankle/heel   5 cm proximal to 1st MTP joint 21.1  Across MTP joint 21.7  Around proximal great toe 7.4  (Blank rows = not tested)  LOWER EXTREMITY LANDMARK 07/01/2019 LEFT eval 10/06/22 10/12/22 10/18/22  At groin       30 cm proximal to suprapatella       20 cm proximal to suprapatella 58 64 63.2 63.7 63.1  10 cm proximal to suprapatella 49.9 57.7 54 52.8 54.6  At midpatella / popliteal crease 37.8 48 48 42.1 41.8  30 cm proximal to floor at  lateral plantar foot 39.3 51.1 47.6 45 47.8  20 cm proximal to floor at lateral plantar foot 31.4 44.8 41 37.8 39.5  10 cm proximal to floor at lateral plantar foot 22 31 28.5 28.5 28.5  Circumference of ankle/heel       5 cm proximal to 1st MTP joint 21 22.4 22.6 22.9 23.2  Across MTP joint 21.8 23 22.8 22.4 22.6  Around proximal great toe 7.2 7.7 7.7 7.4 7.7  (Blank rows = not tested)  LLIS: 60.29  TODAY'S TREATMENT:                                                                                                                                         DATE:  10/25/22: Manual Therapy Manual lymph drainage in supine: short neck, superficial and deep abdominals, Lt axillary nodes, left inguino-axillary anastamoses, left lower extremity from toes and dorsal foot to lateral hip redirecting along.  Medi fitter entered to take measurements for garments - will send in night garment order with a hold placed on a thigh high garment order and see if pt likes the exostrong.  Bandaging: Cocoa butter to entire Lt leg then bandaging with Tg soft liner size Med foot to thigh,  rosidal soft foam foot rectangle to dorsal foot and 1/2" gray foam wagon wheel to lateral malleolus fixated with artiflex (artiflex just to foot/ankle) and then rosidal soft from ankle to  thigh.  8cm at foot/ankle, 10 cm ankle-knee herring bone fashion, 10cm from ankle to knee spiral, 12 cm spiral from below knee to mid thigh with "X" behind knee, then 12 cm herring bone fashion from knee to groin and 12 cm spiral from shin to near groin, pulled stockinette and pts biker shorts over top of bandage and assisted with donning sock and cast shoe on Lt foot  10/23/22: Manual Therapy Manual lymph drainage in supine: short neck, superficial and deep abdominals, Lt axillary nodes, left inguino-axillary anastamoses, left lower extremity from toes and dorsal foot to lateral hip redirecting along. Then in sidelying posterior leg for calf then finished retracing back up Lt LE in supine ending with anastomosis and Lt axillary nodes. Bandaging: Cocoa butter to entire Lt leg then bandaging with Tg soft liner size Med foot to thigh,  rosidal soft foam foot rectangle to dorsal foot and 1/2" gray foam wagon wheel to lateral malleolus fixated with artiflex (artiflex just to foot/ankle) and then rosidal soft from ankle to thigh.  8cm at foot/ankle, 10 cm ankle-knee herring bone fashion, 10cm from ankle to knee spiral, 12 cm spiral from below knee to mid thigh with "X" behind knee, then 12 cm herring bone fashion from knee to groin and 12 cm spiral from shin to near groin, pulled stockinette and pts biker shorts over top of bandage and assisted with donning sock and cast shoe on Lt foot Gave pt ordering info for [employer]  as she is still deciding if she wants to pay for off the shelf to get it faster and then order custom/night separately.   10/20/22: Manual Therapy Manual lymph drainage in supine: short neck, superficial and deep abdominals, Lt axillary nodes, left inguino-axillary anastamoses, left lower extremity from toes and dorsal foot to lateral hip redirecting along. Then in sidelying posterior leg for calf then finished retracing back up Lt LE in supine ending with anastomosis and Lt axillary  nodes. Bandaging: Cocoa butter to entire Lt leg then bandaging with Tg soft liner size Med foot to thigh,  rosidal soft foam foot rectangle to dorsal foot and 1/2" gray foam wagon wheel to lateral malleolus fixated with artiflex (artiflex just to foot/ankle) and then rosidal soft from ankle to thigh.  8cm at foot/ankle, 10 cm ankle-knee herring bone fashion, 10cm from ankle to knee spiral, 12 cm spiral from below knee to mid thigh with "X" behind knee, then 12 cm herring bone fashion from knee to groin and 12 cm spiral from shin to near groin, pulled stockinette and pts biker shorts over top of bandage and assisted with donning sock and cast shoe on Lt foot  10/18/22: Manual Therapy Remeasured circumference Manual lymph drainage in supine: short neck, superficial and deep abdominals, Lt axillary nodes, left inguino-axillary anastamoses, left lower extremity from toes and dorsal foot to lateral hip redirecting along. Then in sidelying posterior leg for calf then finished retracing back up Lt LE in supine ending with anastomosis and Lt axillary nodes. Bandaging: Cocoa butter to entire Lt leg then bandaging with Tg soft liner size Med foot to thigh,  rosidal soft foam foot rectangle to dorsal foot and 1/2" gray foam wagon wheel to lateral malleolus fixated with artiflex (artiflex just to foot/ankle) and then rosidal soft from ankle to thigh.  8cm at foot/ankle, 10 cm ankle-knee herring bone fashion, 10cm from ankle to knee spiral, 12 cm spiral from below knee to mid thigh with "X" behind knee, then 12 cm herring bone fashion from knee to groin and 12 cm spiral from shin to near groin, pulled stockinette and pts biker shorts over top of bandage and assisted with donning sock and cast shoe on Lt foot  10/16/22: Manual Therapy Manual lymph drainage in supine: short neck, superficial and deep abdominals, left inguino-axillary anastamoses, left lower extremity from toes and dorsal foot to lateral hip redirecting  along. Then in sidelying posterior leg for calf with fibrotic techniques mostly at lateral malleolus (when in supine and S/L) then finished retracing back up Lt LE in supine ending with anastomosis and Lt axillary nodes. Bandaging: Cocoa butter to entire Lt leg then bandaging with Tg soft liner size Med foot to thigh,  rosidal soft foam foot rectangle to dorsal foot and 1/2" gray foam wagon wheel to lateral malleolus fixated with artiflex and then rosidal soft from ankle to thigh.  8cm at foot/ankle, 10 cm ankle-knee herring bone fashion, 10cm from ankle to knee spiral, 12 cm spiral from below knee to mid thigh with "X" behind knee, then 12 cm herring bone fashion from knee to groin and 12 cm spiral from shin to thigh, pulled stockinette and pts biker shorts over top of bandage and assisted with donning sock and cast shoe on Lt foot  10/12/22: Manual Therapy Remeasured circumference of Lt LE, see above Manual lymph drainage in supine: short neck, superficial and deep abdominals, left inguino-axillary anastamoses, left lower extremity from toes and dorsal foot to lateral hip redirecting  along. Then in sidelying posterior leg for calf with fibrotic techniques mostly at lateral malleolus (when in supine and S/L) then finished retracing back up Lt LE in supine ending with anastomosis and Lt axillary nodes. Bandaging: Cocoa butter to entire Lt leg then bandaging with Tg soft liner size Med foot to thigh,  rosidal soft foam foot rectangle to dorsal foot and 1/2" gray foam wagon wheel to lateral malleolus fixated with artiflex and then rosidal soft from ankle to thigh.  8cm at foot/ankle, 10 cm ankle-knee herring bone fashion, 10cm from ankle to knee spiral, 12 cm spiral from below knee to mid thigh with "X" behind knee, then 12 cm herring bone fashion from knee to groin and 12 cm spiral from shin to thigh, pulled stockinette and pts biker shorts over top of bandage and assisted with donning sock and cast shoe on Lt  foot  PATIENT EDUCATION:  Education details: importance of compression during exercise, how compression bandages work with muscle to move fluid Person educated: Patient Education method: Explanation Education comprehension: verbalized understanding  HOME EXERCISE PROGRAM: Continue compression pump  ASSESSMENT:  CLINICAL IMPRESSION:  Pt seems to be plateauing in reduction of the left LE.  She still continues with firm fibrosis and lymphostatic fibrosis to the lower leg and knee.  Pitting is still present around the lateral ankle and up the front of the lower leg to the knee at +1.  Her thigh is increased in size but not pitting at this time.  Pt was measured today for a Medi Profile night garment for the left leg from foot to thigh and will require custom due to only knee high being available in ready to wear. A non skid panel on the sole will allow her to walk in the garment without falling and use of an easy open panel will allow for easier donning and doffing.  Quantity of 1.  She will be deciding on the ordering of a day garment at this time due to funds, but pt was also measured for a Medi Mondi 550 Left leg class 2 AG length due to full leg stage 3-4 lymphedema.  She will need a flexture knee zone and extra length to fit the larger shape of her lower leg and a wide dot 5cm silicone band to help the garment stay in placeand help with positioning.  She would benefit from open toe and hallux ease zone due to pain from her last garment due to a bunion.   OBJECTIVE IMPAIRMENTS: decreased knowledge of condition, difficulty walking, increased edema, and pain.   GOALS: Goals reviewed with patient? Yes  SHORT TERM GOALS: Target date: 10/16/22  Pt will demonstrate a 3 cm decrease in edema at popliteal crease to decrease knee discomfort.  Baseline: Goal status: INITIAL  2.  Pt will demonstrate a 3 cm decrease in edema at 30 cm superior to the floor to decrease risk of infection.  Baseline:  Goal  status: INITIAL    LONG TERM GOALS: Target date: 11/06/22  Pt will obtain appropriate compression garment for long term management of edema.  Baseline:  Goal status: INITIAL  2.  Pt will demonstrate a 50% improvement in fibrosis to decrease risk of infection.  Baseline:  Goal status: INITIAL  3.  Pt will report she is able to sleep at night without increased discomfort from her L LE.  Baseline:  Goal status: INITIAL  4.  Pt will be able to independently manage her lymphedema through a compression garment  and using the compression pump.  Baseline:  Goal status: INITIAL  5.  Pt will demonstrate a 9 cm reduction in edema at popliteal crease to decrease risk of infection.  Baseline:  Goal status: INITIAL    PLAN:  PT FREQUENCY: 3x/week  PT DURATION: 6 weeks  PLANNED INTERVENTIONS: Therapeutic exercises, Therapeutic activity, Patient/Family education, Self Care, Orthotic/Fit training, Manual lymph drainage, Compression bandaging, Vasopneumatic device, Manual therapy, and Re-evaluation  PLAN FOR NEXT SESSION: Continue  CDT to LLE   Garments: pt will check on DME providers, emailed Marisue IvanLiz at [facility] to see if we can order there on 10/06/22. Pt self checked and was stated [facility]  XL short exostrong if pt decides to go this route.  Considering custom vs exostrong (10/16/22) Pt ordered exostrong 10/24/22.  Measured for custom night 10/25/22 and custom day if she decides she doesn't like the exostrong.  Note sent to Dr. Truett PernaSherrill for prescription 10/25/22    Idamae Lusherevis, Chariah Bailey R, PT 10/25/2022, 5:14 PM

## 2022-10-27 ENCOUNTER — Ambulatory Visit: Payer: Medicare HMO | Admitting: Rehabilitation

## 2022-10-27 ENCOUNTER — Encounter: Payer: Self-pay | Admitting: Rehabilitation

## 2022-10-27 DIAGNOSIS — C55 Malignant neoplasm of uterus, part unspecified: Secondary | ICD-10-CM

## 2022-10-27 DIAGNOSIS — I890 Lymphedema, not elsewhere classified: Secondary | ICD-10-CM | POA: Diagnosis not present

## 2022-10-27 NOTE — Therapy (Signed)
OUTPATIENT PHYSICAL THERAPY ONCOLOGY TREATMENT  Patient Name: MELESIA AUDET MRN: [id number] DOB:August 29, 1950, 72 y.o., female Today's Date: 10/27/2022  END OF SESSION:  PT End of Session - 10/27/22 1100     Visit Number 14    Number of Visits 19    Date for PT Re-Evaluation 11/06/22    PT Start Time 1103    PT Stop Time 1204    PT Time Calculation (min) 61 min    Activity Tolerance Patient tolerated treatment well    Behavior During Therapy WFL for tasks assessed/performed                 Past Medical History:  Diagnosis Date   Arthritis    GERD (gastroesophageal reflux disease)    History of blood transfusion    Hypothyroidism    Lymphedema    left leg- ted hose makes it worse   Thyroid disease    Uterine cancer    Endometrial cancer - surgery and chemo   Past Surgical History:  Procedure Laterality Date   ABDOMINAL HYSTERECTOMY     APPENDECTOMY     COLONOSCOPY W/ POLYPECTOMY     EYE SURGERY Bilateral    cataracts   IR IMAGING GUIDED PORT INSERTION  05/15/2019   IR REMOVAL TUN ACCESS W/ PORT W/O FL MOD SED  06/23/2020   LIVER BIOPSY     mass removed     2015 from pelvis   TOTAL HIP ARTHROPLASTY Right 07/13/2016   Procedure: RIGHT TOTAL HIP ARTHROPLASTY ANTERIOR APPROACH;  Surgeon: Tarry Kos, MD;  Location: [facility] OR;  Service: Orthopedics;  Laterality: Right;   Patient Active Problem List   Diagnosis Date Noted   Pancytopenia, acquired 05/16/2021   Superficial thrombophlebitis 05/16/2021   Cellulitis of left leg 03/24/2021   Cancer associated pain 03/03/2021   Immunocompromised patient 04/28/2020   Preventive measure 03/09/2020   Leukopenia due to antineoplastic chemotherapy 03/01/2020   Abdominal pain 10/31/2019   Peripheral neuropathy due to chemotherapy 09/09/2019   Seborrheic keratoses, inflamed 08/15/2019   Other fatigue 07/23/2019   Physical debility 06/10/2019   Nausea with vomiting 05/23/2019   Goals of care, counseling/discussion 05/12/2019    Lymphedema    Secondary malignant neoplasm of inguinal lymph nodes 04/21/2019   Hip joint replacement status 07/13/2016   Primary osteoarthritis of right hip 06/01/2016   Endometrioid adenocarcinoma of uterus 06/03/2015   Diarrhea 06/03/2015    PCP: Creola Corn, MD  REFERRING PROVIDER: Ladene Artist, MD  REFERRING DIAG: C55 (ICD-10-CM) - Endometrioid adenocarcinoma of uterus (HCC) I89.0 (ICD-10-CM) - Lymphedema  THERAPY DIAG:  Lymphedema, not elsewhere classified  Endometrioid adenocarcinoma of uterus  ONSET DATE: 01/05/14  Rationale for Evaluation and Treatment: Rehabilitation  SUBJECTIVE:  SUBJECTIVE STATEMENT:   I got my garment   PERTINENT HISTORY:  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 Gross complete resection, tumor involve both ovaries, lymph nodes negative. 6 cycles of adjuvant Taxol/carboplatin. CT abdomen/pelvis 03/28/2019-enlarged right inguinal and iliac nodes. 04/10/2019-right inguinal lymph node biopsy-metastatic adenocarcinoma, ER 90%, PR 95%, PD-L1 combined positive score-1, MSS, tumor mutation burden-4. Cisplatin 05/16/2019 20-1 cycle. Radiation to the lateral pelvis and 1 lymph nodes November 2020. CT 12/72/020-decrease size of right inguinal and external iliac lymph nodes, no evidence of disease progression. Everolimus and letrozole 07/14/2019 - 04/06/2020. CT 10/20/2019-resolution of right iliac lymph node, decrease in size of right inguinal node. CT 03/08/2020-no evidence of recurrent or metastatic disease in the abdomen or pelvis. CT 05/31/2020-no evidence of recurrent or metastatic disease. CT 03/11/2021-increase right inguinal lymph  node, enlargement of bilateral inguinal lymph nodes, subcutaneous soft tissue nodule at the anterior right thigh. Carboplatin 04/06/2021, 04/25/2021, 05/16/2021. CT 07/12/2021-mild decrease in size of bilateral inguinal lymphadenopathy, no progressive disease in the abdomen or pelvis, 0.6 cm left inguinal node was 0.8 cm previously. Letrozole 08/06/2021. CT abdomen/pelvis 04/10/2022-stable right inguinal lymph node, no new site of metastatic disease. CT right femur  04/10/2022 -2.2 x 1 cm anterior subcutaneous mass. Letrozole continued. CT right femur 07/19/2022-mild enlargement of right thigh subcutaneous mass stable right inguinal nodal mass. CTs 07/19/2022-stable right inguinal lymph node, no new or progressive disease. 07/13/16- THA, 01/05/14- total abdominal hysterectomy, neuropathy in bilateral feet    PAIN:  Are you having pain? No pt reports it aches at night  PRECAUTIONS: Other: R THA  WEIGHT BEARING RESTRICTIONS: No  FALLS:  Has patient fallen in last 6 months? 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 and alternates between treadmill/recumbent bike for 20 min, and does weight machines  PATIENT GOALS: to be able to get some of the fluid out   OBJECTIVE:  LYMPHEDEMA ASSESSMENTS:  SURGERY TYPE/DATE: Total abdominal hysterectomy: Gross complete resection, tumor involved both ovaries, lymph nodes negative 01/05/14 NUMBER OF LYMPH NODES REMOVED: 21 nodes - pt unsure how many were bad CHEMOTHERAPY: completed RADIATION: completed INFECTIONS: 1 infection a few years ago LYMPHEDEMA ASSESSMENTS:   LOWER EXTREMITY LANDMARK RIGHT eval  At groin   30 cm proximal to suprapatella   20 cm proximal to suprapatella 57  10 cm proximal to suprapatella 49  At midpatella / popliteal crease 38.6  30 cm proximal to floor at lateral plantar  foot 38.5  20 cm proximal to floor at lateral plantar foot 29.9  10 cm proximal to floor at lateral plantar foot 21.3  Circumference of ankle/heel   5 cm proximal to 1st MTP joint 21.1  Across MTP joint 21.7  Around proximal great toe 7.4  (Blank rows = not tested)  LOWER EXTREMITY LANDMARK 07/01/2019 LEFT eval 10/06/22 10/12/22 10/18/22  At groin       30 cm proximal to suprapatella       20 cm proximal to suprapatella 58 64 63.2 63.7 63.1  10 cm proximal to suprapatella 49.9 57.7 54 52.8 54.6  At midpatella / popliteal crease 37.8 48 48 42.1 41.8  30 cm proximal to floor at lateral plantar foot 39.3 51.1 47.6 45 47.8  20 cm proximal  to floor at lateral plantar foot 31.4 44.8 41 37.8 39.5  10 cm proximal to floor at lateral plantar foot 22 31 28.5 28.5 28.5  Circumference of ankle/heel       5 cm proximal to 1st MTP joint 21 22.4 22.6 22.9 23.2  Across MTP joint 21.8 23 22.8 22.4 22.6  Around proximal great toe 7.2 7.7 7.7 7.4 7.7  (Blank rows = not tested)  LLIS: 60.29  TODAY'S TREATMENT:                                                                                                                                         DATE:   10/25/22: Manual Therapy Manual lymph drainage in supine: short neck, superficial and deep abdominals, Lt axillary nodes, left inguino-axillary anastamoses, left lower extremity from toes and dorsal foot to lateral hip redirecting along.  Bandaging: Cocoa butter to entire Lt leg then bandaging with Tg soft liner size Med foot to thigh,  rosidal soft foam foot rectangle to dorsal foot and 1/2" gray foam wagon wheel to lateral malleolus fixated with artiflex (artiflex just to foot/ankle) and then rosidal soft from ankle to thigh.  8cm at foot/ankle, 10 cm ankle-knee herring bone fashion, 10cm from ankle to knee spiral, 12 cm spiral from below knee to mid thigh with "X" behind knee, then 12 cm herring bone fashion from knee to groin and 12 cm spiral from shin to  near groin, pulled stockinette and pts biker shorts over top of bandage and assisted with donning sock and cast shoe on Lt foot Pts garment fits well but is a bit long - pt reports she got nervous about ordering a short and got a regular instead.   Pt will wear wash and dry her garments over the weekend, use her pump, and sleep in bandages.   She was given bandage ordering information.   10/25/22: Manual Therapy Manual lymph drainage in supine: short neck, superficial and deep abdominals, Lt axillary nodes, left inguino-axillary anastamoses, left lower extremity from toes and dorsal foot to lateral hip redirecting along.  Medi fitter entered to take measurements for garments - will send in night garment order with a hold placed on a thigh high garment order and see if pt likes the exostrong.  Bandaging: Cocoa butter to entire Lt leg then bandaging with Tg soft liner size Med foot to thigh,  rosidal soft foam foot rectangle to dorsal foot and 1/2" gray foam wagon wheel to lateral malleolus fixated with artiflex (artiflex just to foot/ankle) and then rosidal soft from ankle to thigh.  8cm at foot/ankle, 10 cm ankle-knee herring bone fashion, 10cm from ankle to knee spiral, 12 cm spiral from below knee to mid thigh with "X" behind knee, then 12 cm herring bone fashion from knee to groin and 12 cm spiral from shin to near groin, pulled stockinette and pts biker shorts over  top of bandage and assisted with donning sock and cast shoe on Lt foot  10/23/22: Manual Therapy Manual lymph drainage in supine: short neck, superficial and deep abdominals, Lt axillary nodes, left inguino-axillary anastamoses, left lower extremity from toes and dorsal foot to lateral hip redirecting along. Then in sidelying posterior leg for calf then finished retracing back up Lt LE in supine ending with anastomosis and Lt axillary nodes. Bandaging: Cocoa butter to entire Lt leg then bandaging with Tg soft liner size Med foot to thigh,   rosidal soft foam foot rectangle to dorsal foot and 1/2" gray foam wagon wheel to lateral malleolus fixated with artiflex (artiflex just to foot/ankle) and then rosidal soft from ankle to thigh.  8cm at foot/ankle, 10 cm ankle-knee herring bone fashion, 10cm from ankle to knee spiral, 12 cm spiral from below knee to mid thigh with "X" behind knee, then 12 cm herring bone fashion from knee to groin and 12 cm spiral from shin to near groin, pulled stockinette and pts biker shorts over top of bandage and assisted with donning sock and cast shoe on Lt foot Gave pt ordering info for exostrong as she is still deciding if she wants to pay for off the shelf to get it faster and then order custom/night separately.   10/20/22: Manual Therapy Manual lymph drainage in supine: short neck, superficial and deep abdominals, Lt axillary nodes, left inguino-axillary anastamoses, left lower extremity from toes and dorsal foot to lateral hip redirecting along. Then in sidelying posterior leg for calf then finished retracing back up Lt LE in supine ending with anastomosis and Lt axillary nodes. Bandaging: Cocoa butter to entire Lt leg then bandaging with Tg soft liner size Med foot to thigh,  rosidal soft foam foot rectangle to dorsal foot and 1/2" gray foam wagon wheel to lateral malleolus fixated with artiflex (artiflex just to foot/ankle) and then rosidal soft from ankle to thigh.  8cm at foot/ankle, 10 cm ankle-knee herring bone fashion, 10cm from ankle to knee spiral, 12 cm spiral from below knee to mid thigh with "X" behind knee, then 12 cm herring bone fashion from knee to groin and 12 cm spiral from shin to near groin, pulled stockinette and pts biker shorts over top of bandage and assisted with donning sock and cast shoe on Lt foot  10/18/22: Manual Therapy Remeasured circumference Manual lymph drainage in supine: short neck, superficial and deep abdominals, Lt axillary nodes, left inguino-axillary anastamoses, left  lower extremity from toes and dorsal foot to lateral hip redirecting along. Then in sidelying posterior leg for calf then finished retracing back up Lt LE in supine ending with anastomosis and Lt axillary nodes. Bandaging: Cocoa butter to entire Lt leg then bandaging with Tg soft liner size Med foot to thigh,  rosidal soft foam foot rectangle to dorsal foot and 1/2" gray foam wagon wheel to lateral malleolus fixated with artiflex (artiflex just to foot/ankle) and then rosidal soft from ankle to thigh.  8cm at foot/ankle, 10 cm ankle-knee herring bone fashion, 10cm from ankle to knee spiral, 12 cm spiral from below knee to mid thigh with "X" behind knee, then 12 cm herring bone fashion from knee to groin and 12 cm spiral from shin to near groin, pulled stockinette and pts biker shorts over top of bandage and assisted with donning sock and cast shoe on Lt foot  10/16/22: Manual Therapy Manual lymph drainage in supine: short neck, superficial and deep abdominals, left inguino-axillary anastamoses, left lower extremity  from toes and dorsal foot to lateral hip redirecting along. Then in sidelying posterior leg for calf with fibrotic techniques mostly at lateral malleolus (when in supine and S/L) then finished retracing back up Lt LE in supine ending with anastomosis and Lt axillary nodes. Bandaging: Cocoa butter to entire Lt leg then bandaging with Tg soft liner size Med foot to thigh,  rosidal soft foam foot rectangle to dorsal foot and 1/2" gray foam wagon wheel to lateral malleolus fixated with artiflex and then rosidal soft from ankle to thigh.  8cm at foot/ankle, 10 cm ankle-knee herring bone fashion, 10cm from ankle to knee spiral, 12 cm spiral from below knee to mid thigh with "X" behind knee, then 12 cm herring bone fashion from knee to groin and 12 cm spiral from shin to thigh, pulled stockinette and pts biker shorts over top of bandage and assisted with donning sock and cast shoe on Lt  foot  10/12/22: Manual Therapy Remeasured circumference of Lt LE, see above Manual lymph drainage in supine: short neck, superficial and deep abdominals, left inguino-axillary anastamoses, left lower extremity from toes and dorsal foot to lateral hip redirecting along. Then in sidelying posterior leg for calf with fibrotic techniques mostly at lateral malleolus (when in supine and S/L) then finished retracing back up Lt LE in supine ending with anastomosis and Lt axillary nodes. Bandaging: Cocoa butter to entire Lt leg then bandaging with Tg soft liner size Med foot to thigh,  rosidal soft foam foot rectangle to dorsal foot and 1/2" gray foam wagon wheel to lateral malleolus fixated with artiflex and then rosidal soft from ankle to thigh.  8cm at foot/ankle, 10 cm ankle-knee herring bone fashion, 10cm from ankle to knee spiral, 12 cm spiral from below knee to mid thigh with "X" behind knee, then 12 cm herring bone fashion from knee to groin and 12 cm spiral from shin to thigh, pulled stockinette and pts biker shorts over top of bandage and assisted with donning sock and cast shoe on Lt foot  PATIENT EDUCATION:  Education details: importance of compression during exercise, how compression bandages work with muscle to move fluid Person educated: Patient Education method: Explanation Education comprehension: verbalized understanding  HOME EXERCISE PROGRAM: Continue compression pump  ASSESSMENT:  CLINICAL IMPRESSION:  Exostrong garment fits well but is long.  Pt will wash and wear x 1 week with night bandaging and use of the pump and we will check in next week and see if it is working.    OBJECTIVE IMPAIRMENTS: decreased knowledge of condition, difficulty walking, increased edema, and pain.   GOALS: Goals reviewed with patient? Yes  SHORT TERM GOALS: Target date: 10/16/22  Pt will demonstrate a 3 cm decrease in edema at popliteal crease to decrease knee discomfort.  Baseline: Goal status:  INITIAL  2.  Pt will demonstrate a 3 cm decrease in edema at 30 cm superior to the floor to decrease risk of infection.  Baseline:  Goal status: INITIAL    LONG TERM GOALS: Target date: 11/06/22  Pt will obtain appropriate compression garment for long term management of edema.  Baseline:  Goal status: INITIAL  2.  Pt will demonstrate a 50% improvement in fibrosis to decrease risk of infection.  Baseline:  Goal status: INITIAL  3.  Pt will report she is able to sleep at night without increased discomfort from her L LE.  Baseline:  Goal status: INITIAL  4.  Pt will be able to independently manage her lymphedema  through a compression garment and using the compression pump.  Baseline:  Goal status: INITIAL  5.  Pt will demonstrate a 9 cm reduction in edema at popliteal crease to decrease risk of infection.  Baseline:  Goal status: INITIAL    PLAN:  PT FREQUENCY: 3x/week  PT DURATION: 6 weeks  PLANNED INTERVENTIONS: Therapeutic exercises, Therapeutic activity, Patient/Family education, Self Care, Orthotic/Fit training, Manual lymph drainage, Compression bandaging, Vasopneumatic device, Manual therapy, and Re-evaluation  PLAN FOR NEXT SESSION: How was garment? Continue  CDT to LLE    Garments: pt will check on DME providers, emailed Marisue Ivan at [facility] to see if we can order there on 10/06/22. Pt self checked and was stated she should have 80% coverage at [facility].  XL short exostrong if pt decides to go this route.  Considering custom vs exostrong (10/16/22) Pt ordered exostrong 10/24/22.  Measured for custom night 10/25/22 and custom day if she decides she doesn't like the exostrong.  Note sent to Dr. Truett Perna for prescription 10/25/22 Entered night garment order into [facility] 10/26/22 Got exostrong garment 10/27/22    Idamae Lusher, PT 10/27/2022, 12:11 PM

## 2022-10-30 ENCOUNTER — Encounter: Payer: Medicare HMO | Admitting: Physical Therapy

## 2022-11-01 ENCOUNTER — Encounter: Payer: Medicare HMO | Admitting: Physical Therapy

## 2022-11-03 ENCOUNTER — Encounter: Payer: Self-pay | Admitting: Rehabilitation

## 2022-11-03 ENCOUNTER — Ambulatory Visit: Payer: Medicare HMO | Admitting: Rehabilitation

## 2022-11-03 DIAGNOSIS — I890 Lymphedema, not elsewhere classified: Secondary | ICD-10-CM

## 2022-11-03 DIAGNOSIS — C55 Malignant neoplasm of uterus, part unspecified: Secondary | ICD-10-CM

## 2022-11-03 NOTE — Therapy (Addendum)
 OUTPATIENT PHYSICAL THERAPY ONCOLOGY TREATMENT  Patient Name: Dana [name] MRN: [id number] DOB:1951-07-16, 72 y.o., female Today's Date: 11/03/2022  END OF SESSION:  PT End of Session - 11/03/22 0938     Visit Number 15    Number of Visits 19    Date for PT Re-Evaluation 11/06/22    PT Start Time 0903    PT Stop Time 0938    PT Time Calculation (min) 35 min    Activity Tolerance Patient tolerated treatment well    Behavior During Therapy WFL for tasks assessed/performed                  Past Medical History:  Diagnosis Date   Arthritis    GERD (gastroesophageal reflux disease)    History of blood transfusion    Hypothyroidism    Lymphedema    left leg- ted hose makes it worse   Thyroid  disease    Uterine cancer    Endometrial cancer - surgery and chemo   Past Surgical History:  Procedure Laterality Date   ABDOMINAL HYSTERECTOMY     APPENDECTOMY     COLONOSCOPY W/ POLYPECTOMY     EYE SURGERY Bilateral    cataracts   IR IMAGING GUIDED PORT INSERTION  05/15/2019   IR REMOVAL TUN ACCESS W/ PORT W/O FL MOD SED  06/23/2020   LIVER BIOPSY     mass removed     2015 from pelvis   TOTAL HIP ARTHROPLASTY Right 07/13/2016   Procedure: RIGHT TOTAL HIP ARTHROPLASTY ANTERIOR APPROACH;  Surgeon: Wes Hamman, MD;  Location: [facility] OR;  Service: Orthopedics;  Laterality: Right;   Patient Active Problem List   Diagnosis Date Noted   Pancytopenia, acquired 05/16/2021   Superficial thrombophlebitis 05/16/2021   Cellulitis of left leg 03/24/2021   Cancer associated pain 03/03/2021   Immunocompromised patient 04/28/2020   Preventive measure 03/09/2020   Leukopenia due to antineoplastic chemotherapy 03/01/2020   Abdominal pain 10/31/2019   Peripheral neuropathy due to chemotherapy 09/09/2019   Seborrheic keratoses, inflamed 08/15/2019   Other fatigue 07/23/2019   Physical debility 06/10/2019   Nausea with vomiting 05/23/2019   Goals of care, counseling/discussion  05/12/2019   Lymphedema    Secondary malignant neoplasm of inguinal lymph nodes 04/21/2019   Hip joint replacement status 07/13/2016   Primary osteoarthritis of right hip 06/01/2016   Endometrioid adenocarcinoma of uterus 06/03/2015   Diarrhea 06/03/2015    PCP: Margarete Sharps, MD  REFERRING PROVIDER: Sumner Ends, MD  REFERRING DIAG: C55 (ICD-10-CM) - Endometrioid adenocarcinoma of uterus (HCC) I89.0 (ICD-10-CM) - Lymphedema  THERAPY DIAG:  Lymphedema, not elsewhere classified  Endometrioid adenocarcinoma of uterus  ONSET DATE: 01/05/14  Rationale for Evaluation and Treatment: Rehabilitation  SUBJECTIVE:  SUBJECTIVE STATEMENT:   The garment is okay.  It does slide down and it is probably too long.   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 Gross complete resection, tumor involve both ovaries, lymph nodes negative. 6 cycles of adjuvant Taxol /carboplatin . CT abdomen/pelvis 03/28/2019-enlarged right inguinal and iliac nodes. 04/10/2019-right inguinal lymph node biopsy-metastatic adenocarcinoma, ER 90%, PR 95%, PD-L1 combined positive score-1, MSS, tumor mutation burden-4. Cisplatin  05/16/2019 20-1 cycle. Radiation to the lateral pelvis and 1 lymph nodes November 2020. CT 12/72/020-decrease size of right inguinal and external iliac lymph nodes, no evidence of disease progression. Everolimus  and letrozole  07/14/2019 - 04/06/2020. CT 10/20/2019-resolution of right iliac lymph node, decrease in size of right inguinal node. CT 03/08/2020-no evidence of recurrent or metastatic disease in the abdomen or pelvis. CT 05/31/2020-no evidence of recurrent or  metastatic disease. CT 03/11/2021-increase right inguinal lymph node, enlargement of bilateral inguinal lymph nodes, subcutaneous soft tissue nodule at the anterior right thigh. Carboplatin  04/06/2021, 04/25/2021, 05/16/2021. CT 07/12/2021-mild decrease in size of bilateral inguinal lymphadenopathy, no progressive disease in the abdomen or pelvis, 0.6 cm left inguinal node was 0.8 cm previously. Letrozole  08/06/2021. CT abdomen/pelvis 04/10/2022-stable right inguinal lymph node, no new site of metastatic disease. CT right femur  04/10/2022 -2.2 x 1 cm anterior subcutaneous mass. Letrozole  continued. CT right femur 07/19/2022-mild enlargement of right thigh subcutaneous mass stable right inguinal nodal mass. CTs 07/19/2022-stable right inguinal lymph node, no new or progressive disease. 07/13/16- THA, 01/05/14- total abdominal hysterectomy, neuropathy in bilateral feet    PAIN:  Are you having pain? No pt reports it aches at night  PRECAUTIONS: Other: R THA  WEIGHT BEARING RESTRICTIONS: No  FALLS:  Has patient fallen in last 6 months? 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 and alternates between treadmill/recumbent bike for 20 min, and does weight machines  PATIENT GOALS: to be able to get some of the fluid out   OBJECTIVE:  LYMPHEDEMA ASSESSMENTS:  SURGERY TYPE/DATE: Total abdominal hysterectomy: Gross complete resection, tumor involved both ovaries, lymph nodes negative 01/05/14 NUMBER OF LYMPH NODES REMOVED: 21 nodes - pt unsure how many were bad CHEMOTHERAPY: completed RADIATION: completed INFECTIONS: 1 infection a few years ago LYMPHEDEMA ASSESSMENTS:   LOWER EXTREMITY LANDMARK RIGHT eval  At groin   30 cm proximal to suprapatella   20 cm proximal to suprapatella 57  10 cm proximal to suprapatella 49  At midpatella /  popliteal crease 38.6  30 cm proximal to floor at lateral plantar foot 38.5  20 cm proximal to floor at lateral plantar foot 29.9  10 cm proximal to floor at lateral plantar foot 21.3  Circumference of ankle/heel   5 cm proximal to 1st MTP joint 21.1  Across MTP joint 21.7  Around proximal great toe 7.4  (Blank rows = not tested)  LOWER EXTREMITY LANDMARK 07/01/2019 LEFT eval 10/06/22 10/12/22 10/18/22 11/03/22  At groin        30 cm proximal to suprapatella        20 cm proximal to suprapatella 58 64 63.2 63.7 63.1 59.5  10 cm proximal to suprapatella 49.9 57.7 54 52.8 54.6 55  At midpatella / popliteal crease 37.8 48 48 42.1 41.8   30  cm proximal to floor at lateral plantar foot 39.3 51.1 47.6 45 47.8 47.5  20 cm proximal to floor at lateral plantar foot 31.4 44.8 41 37.8 39.5 41.0  10 cm proximal to floor at lateral plantar foot 22 31 28.5 28.5 28.5 29  Circumference of ankle/heel        5 cm proximal to 1st MTP joint 21 22.4 22.6 22.9 23.2   Across MTP joint 21.8 23 22.8 22.4 22.6 22.5  Around proximal great toe 7.2 7.7 7.7 7.4 7.7   (Blank rows = not tested)  LLIS: 60.29  TODAY'S TREATMENT:                                                                                                                                         DATE:  11/03/22: Remeasured for garment check.  Pt is overall steady.  The garment seems to be binding a bit behind the knee as it slides down.  Pt was told again she can return for a short if she would like.  Discussed garments and pt would like to not pursue the night garment.  Email was sent to [facility] to reverse the original request and to now get a quote on the custom day garment but not the night garment.   Pt will continue with day garment, pump, and bandaging at night and has her list of bandaging  supplies as needed  10/25/22: Manual Therapy Manual lymph drainage in supine: short neck, superficial and deep abdominals, Lt axillary nodes, left  inguino-axillary anastamoses, left lower extremity from toes and dorsal foot to lateral hip redirecting along.  Bandaging: Cocoa butter to entire Lt leg then bandaging with Tg soft liner size Med foot to thigh,  rosidal soft foam foot rectangle to dorsal foot and 1/2" gray foam wagon wheel to lateral malleolus fixated with artiflex (artiflex just to foot/ankle) and then rosidal soft from ankle to thigh.  8cm at foot/ankle, 10 cm ankle-knee herring bone fashion, 10cm from ankle to knee spiral, 12 cm spiral from below knee to mid thigh with "X" behind knee, then 12 cm herring bone fashion from knee to groin and 12 cm spiral from shin to near groin, pulled stockinette and pts biker shorts over top of bandage and assisted with donning sock and cast shoe on Lt foot Pts garment fits well but is a bit long - pt reports she got nervous about ordering a short and got a regular instead.   Pt will wear wash and dry her garments over the weekend, use her pump, and sleep in bandages.   She was given bandage ordering information.   10/25/22: Manual Therapy Manual lymph drainage in supine: short neck, superficial and deep abdominals, Lt axillary nodes, left inguino-axillary anastamoses, left lower extremity from toes and dorsal foot to lateral hip redirecting along.  Medi fitter entered to take measurements for garments - will send  in night garment order with a hold placed on a thigh high garment order and see if pt likes the exostrong.  Bandaging: Cocoa butter to entire Lt leg then bandaging with Tg soft liner size Med foot to thigh,  rosidal soft foam foot rectangle to dorsal foot and 1/2" gray foam wagon wheel to lateral malleolus fixated with artiflex (artiflex just to foot/ankle) and then rosidal soft from ankle to thigh.  8cm at foot/ankle, 10 cm ankle-knee herring bone fashion, 10cm from ankle to knee spiral, 12 cm spiral from below knee to mid thigh with "X" behind knee, then 12 cm herring bone fashion from knee  to groin and 12 cm spiral from shin to near groin, pulled stockinette and pts biker shorts over top of bandage and assisted with donning sock and cast shoe on Lt foot   PATIENT EDUCATION:  Education details: importance of compression during exercise, how compression bandages work with muscle to move fluid Person educated: Patient Education method: Explanation Education comprehension: verbalized understanding  HOME EXERCISE PROGRAM: Continue compression pump  ASSESSMENT:  CLINICAL IMPRESSION:  Exostrong garment fits well but is long.  Measurements are slightly up but this is due to garment vs bandaging.  Will pursue custom day quote but not night garment for now.  Pt was shown It stays.   OBJECTIVE IMPAIRMENTS: decreased knowledge of condition, difficulty walking, increased edema, and pain.   GOALS: Goals reviewed with patient? Yes  SHORT TERM GOALS: Target date: 10/16/22  Pt will demonstrate a 3 cm decrease in edema at popliteal crease to decrease knee discomfort.  Baseline: Goal status:MET  2.  Pt will demonstrate a 3 cm decrease in edema at 30 cm superior to the floor to decrease risk of infection.  Baseline:  Goal status: MET    LONG TERM GOALS: Target date: 11/06/22  Pt will obtain appropriate compression garment for long term management of edema.  Baseline:  Goal status: MET  2.  Pt will demonstrate a 50% improvement in fibrosis to decrease risk of infection.  Baseline:  Goal status: MET  3.  Pt will report she is able to sleep at night without increased discomfort from her L LE.  Baseline:  Goal status: MET  4.  Pt will be able to independently manage her lymphedema through a compression garment and using the compression pump.  Baseline:  Goal status: MET  5.  Pt will demonstrate a 9 cm reduction in edema at popliteal crease to decrease risk of infection.  Baseline:  Goal status: INITIAL    PLAN:  PT FREQUENCY: 3x/week  PT DURATION: 6 weeks  PLANNED  INTERVENTIONS: Therapeutic exercises, Therapeutic activity, Patient/Family education, Self Care, Orthotic/Fit training, Manual lymph drainage, Compression bandaging, Vasopneumatic device, Manual therapy, and Re-evaluation  PLAN FOR NEXT SESSION: How was garment? Continue  CDT to LLE    Garments: pt will check on DME providers, emailed Paola Bohr at [facility] to see if we can order there on 10/06/22. Pt self checked and was stated she should have 80% coverage at [facility].  XL short exostrong if pt decides to go this route.  Considering custom vs exostrong (10/16/22) Pt ordered exostrong 10/24/22.  Measured for custom night 10/25/22 and custom day if she decides she doesn't like the exostrong.  Note sent to Dr. Scherrie Curt for prescription 10/25/22 Entered night garment order into [facility] 10/26/22 Got exostrong garment 10/27/22 Emailed [facility] to see about day garment and not night garment 11/03/22   Danecia Underdown, Durward Gillis, PT 11/03/2022, 9:39 AM  PHYSICAL THERAPY DISCHARGE SUMMARY  Visits from Start of Care: 15  Current functional level related to goals / functional outcomes: See above   Remaining deficits: Chronic lymphedema   Education / Equipment: Per above  Plan: Patient agrees to discharge.  P Patient is being discharged due to meeting the stated rehab goals.

## 2022-11-20 ENCOUNTER — Inpatient Hospital Stay: Payer: Medicare HMO | Attending: Hematology and Oncology | Admitting: Oncology

## 2022-11-20 VITALS — BP 130/84 | HR 74 | Temp 98.2°F | Resp 18 | Ht 62.0 in | Wt 172.5 lb

## 2022-11-20 DIAGNOSIS — R2241 Localized swelling, mass and lump, right lower limb: Secondary | ICD-10-CM | POA: Diagnosis not present

## 2022-11-20 DIAGNOSIS — C541 Malignant neoplasm of endometrium: Secondary | ICD-10-CM | POA: Diagnosis not present

## 2022-11-20 DIAGNOSIS — C55 Malignant neoplasm of uterus, part unspecified: Secondary | ICD-10-CM

## 2022-11-20 DIAGNOSIS — C774 Secondary and unspecified malignant neoplasm of inguinal and lower limb lymph nodes: Secondary | ICD-10-CM | POA: Diagnosis not present

## 2022-11-20 NOTE — Progress Notes (Signed)
[facility] OFFICE PROGRESS NOTE   Diagnosis: Endometrial cancer  INTERVAL HISTORY:   Dana [name] returns as scheduled.  She continues to resolve.  Left leg edema has improved with treatment at the lymphedema clinic.  She is now using a home lymphedema pump, wrapping, and a lymphedema sleeve.  She wears a left leg sleeve during the day. The right thigh mass has enlarged.  The mass is now "sore ".  Objective:  Vital signs in last 24 hours:  Blood pressure 130/84, pulse 74, temperature 98.2 F (36.8 C), temperature source Oral, resp. rate 18, height 5\' 2"  (1.575 m), weight 172 lb 8 oz (78.2 kg), SpO2 99 %.    Lymphatics: No cervical, supraclavicular, axillary, or left inguinal nodes.  Firm 3 cm lateral right inguinal node Resp: Lungs clear bilaterally Cardio: Regular rate and rhythm GI: No hepatosplenomegaly Vascular: Edema throughout the left leg  Skin: 4 cm purple raised mobile cutaneous mass at the anterior right thigh   Lab Results:  Lab Results  Component Value Date   WBC 5.4 06/26/2022   HGB 14.1 06/26/2022   HCT 42.1 06/26/2022   MCV 92.7 06/26/2022   PLT 256 06/26/2022   NEUTROABS 3.6 06/26/2022    CMP  Lab Results  Component Value Date   NA 139 06/26/2022   K 3.9 06/26/2022   CL 102 06/26/2022   CO2 27 06/26/2022   GLUCOSE 96 06/26/2022   BUN 9 06/26/2022   CREATININE 0.81 06/26/2022   CALCIUM 9.2 06/26/2022   PROT 6.2 (L) 06/26/2022   ALBUMIN 3.8 06/26/2022   AST 15 06/26/2022   ALT 11 06/26/2022   ALKPHOS 65 06/26/2022   BILITOT 0.5 06/26/2022   GFRNONAA >60 06/26/2022   GFRAA >60 04/06/2020     Medications: I have reviewed the patient's current medications.   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, status post a TAH, BSO, appendectomy, pelvic and periaortic lymphadenectomy, and wedge resection of a liver lesion Gross complete resection, tumor involve both ovaries, lymph nodes  negative 6 cycles of adjuvant Taxol/carboplatin CT abdomen/pelvis 03/28/2019-enlarged right inguinal and iliac nodes 04/10/2019-right inguinal lymph node biopsy-metastatic adenocarcinoma, ER 90%, PR 95%, PD-L1 combined positive score-1, MSS, tumor mutation burden-4 Cisplatin 05/16/2019 20-1 cycle Radiation to the lateral pelvis and 1 lymph nodes November 2020 CT 12/72/020-decrease size of right inguinal and external iliac lymph nodes, no evidence of disease progression Everolimus and letrozole 07/14/2019 - 04/06/2020 CT 10/20/2019-resolution of right iliac lymph node, decrease in size of right inguinal node CT 03/08/2020-no evidence of recurrent or metastatic disease in the abdomen or pelvis CT 05/31/2020-no evidence of recurrent or metastatic disease CT 03/11/2021-increase right inguinal lymph node, enlargement of bilateral inguinal lymph nodes, subcutaneous soft tissue nodule at the anterior right thigh Carboplatin 04/06/2021, 04/25/2021, 05/16/2021 CT 07/12/2021-mild decrease in size of bilateral inguinal lymphadenopathy, no progressive disease in the abdomen or pelvis, 0.6 cm left inguinal node was 0.8 cm previously Letrozole 08/06/2021 CT abdomen/pelvis 04/10/2022-stable right inguinal lymph node, no new site of metastatic disease CT right femur  04/10/2022 -2.2 x 1 cm anterior subcutaneous mass Letrozole continued CT right femur 07/19/2022-mild enlargement of right thigh subcutaneous mass stable right inguinal nodal mass CTs 07/19/2022-stable right inguinal lymph node, no new or progressive disease 11/20/2022-right thigh mass larger and tender, letrozole discontinued  2.  Left lower extremity lymphedema, improved after therapy in the lymphedema clinic  3.  Chronic diarrhea controlled with Lomotil  Disposition: Dana [name] has metastatic endometrial cancer.  There is clinical evidence of disease progression with enlargement of the right thigh mass in the right inguinal lymph node mass  appears unchanged.  We discussed treatment options.  She will discontinue letrozole.  We discussed repeat treatment with carboplatin, and pembrolizumab/lenvatinib.  She would like to consider surgical movement of the anterior thigh mass.  The mass is now "sore "and "bumps into things "with the thigh.  She understands removal of mass will not impact on the systemic tumor burden.  We will make a referral to surgical oncology to consider resection of the right thigh mass.  She does not wish to consider palliative radiation given the history of r lymphedema.  The plan is to begin salvage systemic therapy if the mass is not easily resectable.  She will be referred for a restaging CT of the right thigh and surgical cell.  She will return for an office visit in approximately 3 weeks.  Thornton Papas, MD  11/20/2022  9:40 AM

## 2022-11-27 ENCOUNTER — Other Ambulatory Visit: Payer: Self-pay | Admitting: General Surgery

## 2022-11-27 DIAGNOSIS — I890 Lymphedema, not elsewhere classified: Secondary | ICD-10-CM | POA: Diagnosis not present

## 2022-11-27 DIAGNOSIS — C55 Malignant neoplasm of uterus, part unspecified: Secondary | ICD-10-CM | POA: Diagnosis not present

## 2022-11-27 DIAGNOSIS — R2241 Localized swelling, mass and lump, right lower limb: Secondary | ICD-10-CM | POA: Diagnosis not present

## 2022-11-28 ENCOUNTER — Ambulatory Visit (HOSPITAL_BASED_OUTPATIENT_CLINIC_OR_DEPARTMENT_OTHER)
Admission: RE | Admit: 2022-11-28 | Discharge: 2022-11-28 | Disposition: A | Discharge status: Home / Self Care | Payer: Medicare HMO | Source: Ambulatory Visit | Attending: Oncology | Admitting: Oncology

## 2022-11-28 DIAGNOSIS — C55 Malignant neoplasm of uterus, part unspecified: Secondary | ICD-10-CM

## 2022-11-28 DIAGNOSIS — R2241 Localized swelling, mass and lump, right lower limb: Secondary | ICD-10-CM | POA: Diagnosis not present

## 2022-12-15 ENCOUNTER — Other Ambulatory Visit: Payer: Self-pay

## 2022-12-15 ENCOUNTER — Encounter (HOSPITAL_BASED_OUTPATIENT_CLINIC_OR_DEPARTMENT_OTHER): Payer: Self-pay | Admitting: General Surgery

## 2022-12-15 ENCOUNTER — Inpatient Hospital Stay: Payer: Medicare HMO | Admitting: Oncology

## 2022-12-15 VITALS — BP 139/81 | HR 67 | Temp 97.5°F | Resp 16 | Wt 170.9 lb

## 2022-12-15 DIAGNOSIS — C55 Malignant neoplasm of uterus, part unspecified: Secondary | ICD-10-CM

## 2022-12-15 DIAGNOSIS — C774 Secondary and unspecified malignant neoplasm of inguinal and lower limb lymph nodes: Secondary | ICD-10-CM | POA: Diagnosis not present

## 2022-12-15 DIAGNOSIS — C541 Malignant neoplasm of endometrium: Secondary | ICD-10-CM | POA: Diagnosis not present

## 2022-12-15 DIAGNOSIS — R2241 Localized swelling, mass and lump, right lower limb: Secondary | ICD-10-CM | POA: Diagnosis not present

## 2022-12-15 NOTE — Progress Notes (Signed)
[facility] OFFICE PROGRESS NOTE   Diagnosis: Uterine cancer  INTERVAL HISTORY:   Dana [name] reports the right thigh mass is growing.  She has occasional "cramps "in the right thigh.  No right leg swelling.  She feels "sick ".  No nausea or vomiting, but she has anorexia and GI upset after eating.  No difficulty with bowel or bladder function. She saw Dr. Donell Beers on 11/27/2022 and is scheduled for resection of the right thigh mass 12/25/2022.  Objective:  Vital signs in last 24 hours:  Blood pressure 139/81, pulse 67, temperature (!) 97.5 F (36.4 C), temperature source Oral, resp. rate 16, weight 170 lb 14.4 oz (77.5 kg), SpO2 100 %.     Lymphatics: Firm 3-4 cm mass in the right inguinal region Resp: Lungs clear bilaterally Cardio: Regular rate and rhythm GI: No hepatosplenomegaly, no mass, nontender Vascular: Edema throughout the left leg.  No right leg edema  Skin: 3-4 cm fungating mass at the right anterior thigh is a mobile.  Lab Results:  Lab Results  Component Value Date   WBC 5.4 06/26/2022   HGB 14.1 06/26/2022   HCT 42.1 06/26/2022   MCV 92.7 06/26/2022   PLT 256 06/26/2022   NEUTROABS 3.6 06/26/2022    CMP  Lab Results  Component Value Date   NA 139 06/26/2022   K 3.9 06/26/2022   CL 102 06/26/2022   CO2 27 06/26/2022   GLUCOSE 96 06/26/2022   BUN 9 06/26/2022   CREATININE 0.81 06/26/2022   CALCIUM 9.2 06/26/2022   PROT 6.2 (L) 06/26/2022   ALBUMIN 3.8 06/26/2022   AST 15 06/26/2022   ALT 11 06/26/2022   ALKPHOS 65 06/26/2022   BILITOT 0.5 06/26/2022   GFRNONAA >60 06/26/2022   GFRAA >60 04/06/2020    Medications: I have reviewed the patient's current medications.   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, status post a TAH, BSO, appendectomy, pelvic and periaortic lymphadenectomy, and wedge resection of a liver lesion Gross complete resection, tumor involve both ovaries, lymph  nodes negative 6 cycles of adjuvant Taxol/carboplatin CT abdomen/pelvis 03/28/2019-enlarged right inguinal and iliac nodes 04/10/2019-right inguinal lymph node biopsy-metastatic adenocarcinoma, ER 90%, PR 95%, PD-L1 combined positive score-1, MSS, tumor mutation burden-4 Cisplatin 05/16/2019 20-1 cycle Radiation to the lateral pelvis and 1 lymph nodes November 2020 CT 12/72/020-decrease size of right inguinal and external iliac lymph nodes, no evidence of disease progression Everolimus and letrozole 07/14/2019 - 04/06/2020 CT 10/20/2019-resolution of right iliac lymph node, decrease in size of right inguinal node CT 03/08/2020-no evidence of recurrent or metastatic disease in the abdomen or pelvis CT 05/31/2020-no evidence of recurrent or metastatic disease CT 03/11/2021-increase right inguinal lymph node, enlargement of bilateral inguinal lymph nodes, subcutaneous soft tissue nodule at the anterior right thigh Carboplatin 04/06/2021, 04/25/2021, 05/16/2021 CT 07/12/2021-mild decrease in size of bilateral inguinal lymphadenopathy, no progressive disease in the abdomen or pelvis, 0.6 cm left inguinal node was 0.8 cm previously Letrozole 08/06/2021 CT abdomen/pelvis 04/10/2022-stable right inguinal lymph node, no new site of metastatic disease CT right femur  04/10/2022 -2.2 x 1 cm anterior subcutaneous mass Letrozole continued CT right femur 07/19/2022-mild enlargement of right thigh subcutaneous mass stable right inguinal nodal mass CTs 07/19/2022-stable right inguinal lymph node, no new or progressive disease 11/20/2022-right thigh mass larger and tender, letrozole discontinued 11/28/2022-significant enlargement of anterior right thigh soft tissue mass, slightly larger right inguinal mass  2.  Left lower extremity lymphedema, improved after  therapy in the lymphedema clinic  3.  Chronic diarrhea controlled with Lomotil    Disposition: Dana [name] has metastatic endometrial cancer.  She is symptomatic with  an large and painful mass at the right anterior thigh.  We discussed treatment options again today.  She understands no therapy will be curative.  She would like to have the mass surgically removed.  She saw Dr. Donell Beers and is scheduled for removal of the mass on 12/25/2022.  The right groin mass appears to be slowly enlarging.  We will consider systemic treatment options, likely pembrolizumab/lenvatinib, after she has recovered from surgery.  She will return for an office visit 01/08/2023.  Thornton Papas, MD  12/15/2022  10:03 AM

## 2022-12-19 NOTE — Progress Notes (Signed)

## 2022-12-21 NOTE — H&P (Signed)
REFERRING PHYSICIAN: Lucile Shutters,* PROVIDER: Matthias Hughs, MD MRN: [id number] DOB: Nov 17, 1950  Subjective  Chief Complaint: New Consultation (- R thigh mass )  History of Present Illness: Dana [name] is a 72 y.o. female who is seen today as an office consultation for evaluation of New Consultation (- R thigh mass )  Patient is referred by Dr. Truett Perna for a enlarging right thigh mass. The patient has had stage IV endometrial cancer since 2015. She had a TAH/BSO/appendectomy/lymphadenectomy and wedge resection of the liver lesion. This was followed by chemotherapy. In 2020 she was found to have enlarged inguinal and iliac nodes on the right. Biopsy was performed demonstrating recurrent endometrial cancer. She had cisplatin x 1 cycle followed by radiation. She started everolimus and letrozole and took that for around 10 months in 2020 and 2021. She started having enlargement of inguinal lymph nodes in 2022 bilaterally. She developed significant left lower extremity lymphedema.  In 2023, she was noted to have a subcutaneous mass growing on her right anterior thigh. This is visible on exam as well as on CT. She continued on letrozole. This started enlarging. On her last visit, it was much bigger and was exophytic. Given discomfort from this, she is referred for excision.  Ct femur 07/19/22 IMPRESSION: 1. Mild enlargement of the subcutaneous mass anteriorly in the proximal right femur. Imaging features remain nonspecific by CT, but given enlargement, this could reflect a metastasis or soft tissue sarcoma. Consider further evaluation with PET-CT and/or tissue sampling. 2. Stable right inguinal nodal mass. 3. No new or other enlarging soft tissue nodules identified. 4. No evidence of osseous metastatic disease. 5. Findings for the CT of the chest, abdomen and pelvis are dictated separately.  CT c/a/p 07/19/22 IMPRESSION: 1. Similar isolated right inguinal adenopathy. 2. No new  or progressive disease. 3. Minimal nodularity along the left hemidiaphragm is not readily apparent on the prior exam. Most likely an incidental benign subpleural lymph node. Recommend attention on follow-up. 4. Edema about the pannus again identified. Correlate with symptoms of panniculitis. 5. Tiny hiatal hernia  Pathology 04/10/19 A. LYMPH NODE, RIGHT INGUINAL, NEEDLE CORE BIOPSY: - Metastatic adenocarcinoma. COMMENT: The morphology is most consistent with metastatic endometrioid adenocarcinoma. Immunohistochemistry can be performed if clinically indicated. There is sufficient tissue for additional testing.  Review of Systems: A complete review of systems was obtained from the patient. I have reviewed this information and discussed as appropriate with the patient. See HPI as well for other ROS.  Review of Systems All other systems reviewed and are negative.   Medical History: Past Medical History: Diagnosis Date GERD (gastroesophageal reflux disease) History of cancer  Patient Active Problem List Diagnosis Endometrioid adenocarcinoma of uterus (CMS/HHS-HCC) Mass of thigh, right Lymphedema of left leg  Past Surgical History: Procedure Laterality Date JOINT REPLACEMENT   Allergies Allergen Reactions Oxycodone Nausea And Vomiting Other reaction(s): GI Upset, Tetracycline Nausea Other reaction(s): Unknown Penicillins Nausea And Vomiting and Rash Red, flushed  Has patient had a PCN reaction causing immediate rash, facial/tongue/throat swelling, SOB or lightheadedness with hypotension: Yes Has patient had a PCN reaction causing severe rash involving mucus membranes or skin necrosis: No Has patient had a PCN reaction that required hospitalization No Has patient had a PCN reaction occurring within the last 10 years: Yes If all of the above answers are "NO", then may proceed with Cephalosporin use.  No current outpatient medications on file prior to visit.  No current  facility-administered medications on file prior to visit.  No family history on file.  Social History  Tobacco Use Smoking Status Never Smokeless Tobacco Never   Social History  Socioeconomic History Marital status: Single Tobacco Use Smoking status: Never Smokeless tobacco: Never Substance and Sexual Activity Alcohol use: Not Currently Drug use: Never    Objective:   Vitals: BP: (!) 154/85 Pulse: 97 Temp: 36.7 C (98 F) SpO2: 98% Weight: 79.4 kg (175 lb) Height: 157.5 cm (5\' 2" )  Body mass index is 32.01 kg/m.  Head: Normocephalic and atraumatic. Mouth/Throat: Oropharynx is clear and moist. No oropharyngeal exudate. Eyes: Conjunctivae are normal. Pupils are equal, round, and reactive to light. No scleral icterus. Neck: Normal range of motion. Neck supple. No tracheal deviation present. No thyromegaly present. Cardiovascular: Normal rate, regular rhythm, normal heart sounds and intact distal pulses. Exam reveals no gallop and no friction rub. No murmur heard. Respiratory: Effort normal and breath sounds normal. No respiratory distress. No wheezes, rales or rhonchi. No chest wall tenderness. GI: Soft. Bowel sounds are normal. Abdomen is soft, non tender, non distended. No masses or hepatosplenomegaly is present. There is no rebound and no guarding. Musculoskeletal: . Extremities are non tender. Patient has a 5 x 6 cm exophytic violaceous mass on her right anterior lateral thigh. This is approximately 8 to 10 cm from her groin crease. It is over the lateral aspect of the rectus femoris. Lymphadenopathy: No cervical or axillary adenopathy. Neurological: Alert and oriented to person, place, and time. Coordination normal. Skin: Skin is warm and dry. No rash noted. No diaphoresis. No erythema. No pallor. Psychiatric: Normal mood and affect.Behavior is normal. Judgment and thought content normal.  Labs, Imaging and Diagnostic Testing: None recent    Assessment and  Plan:  Diagnoses and all orders for this visit:  Endometrioid adenocarcinoma of uterus (CMS/HHS-HCC)  Mass of thigh, right  Lymphedema of left leg   Patient has been living with stage IV endometrioid adenocarcinoma since 2015. The only site of growing disease is this right thigh mass. She has stable disease in her bilateral inguinal regions. I do think it is reasonable to perform a palliative resection of this mass. I discussed with the patient that I do not think addressing the inguinal lymphadenopathy would be helpful. This would lead to significant increase in her lymphedema and cause her to have lymphedema on the right leg as bad as it is on the left leg. This would certainly impair her quality of life. I think if we do not resect this lesion that it is going to become a fungating wound that may have significant bleeding.  She is getting repeat imaging tomorrow. I would plan to do a wide local excision of this malignant thigh mass with advancement flap closure. I discussed the surgery with the patient. I reviewed the risks of bleeding, infection, permanent numbness, damage to adjacent structures, wound breakdown, possible need for dressing changes, possible difficulty healing because of lymphedema, heart or lung complications, blood clot, and more.  She understands and wishes to proceed.

## 2022-12-25 ENCOUNTER — Ambulatory Visit (HOSPITAL_BASED_OUTPATIENT_CLINIC_OR_DEPARTMENT_OTHER)
Admission: RE | Admit: 2022-12-25 | Discharge: 2022-12-25 | Disposition: A | Discharge status: Home / Self Care | Payer: Medicare HMO | Attending: General Surgery | Admitting: General Surgery

## 2022-12-25 ENCOUNTER — Other Ambulatory Visit: Payer: Self-pay

## 2022-12-25 ENCOUNTER — Ambulatory Visit (HOSPITAL_BASED_OUTPATIENT_CLINIC_OR_DEPARTMENT_OTHER): Payer: Medicare HMO | Admitting: Certified Registered"

## 2022-12-25 ENCOUNTER — Encounter (HOSPITAL_BASED_OUTPATIENT_CLINIC_OR_DEPARTMENT_OTHER): Payer: Self-pay | Admitting: General Surgery

## 2022-12-25 ENCOUNTER — Encounter (HOSPITAL_BASED_OUTPATIENT_CLINIC_OR_DEPARTMENT_OTHER)
Admission: RE | Disposition: A | Discharge status: Home / Self Care | Payer: Self-pay | Source: Home / Self Care | Attending: General Surgery

## 2022-12-25 DIAGNOSIS — I890 Lymphedema, not elsewhere classified: Secondary | ICD-10-CM | POA: Diagnosis not present

## 2022-12-25 DIAGNOSIS — K449 Diaphragmatic hernia without obstruction or gangrene: Secondary | ICD-10-CM | POA: Diagnosis not present

## 2022-12-25 DIAGNOSIS — R2241 Localized swelling, mass and lump, right lower limb: Secondary | ICD-10-CM | POA: Diagnosis not present

## 2022-12-25 DIAGNOSIS — E039 Hypothyroidism, unspecified: Secondary | ICD-10-CM | POA: Insufficient documentation

## 2022-12-25 DIAGNOSIS — K219 Gastro-esophageal reflux disease without esophagitis: Secondary | ICD-10-CM | POA: Insufficient documentation

## 2022-12-25 DIAGNOSIS — Z8542 Personal history of malignant neoplasm of other parts of uterus: Secondary | ICD-10-CM | POA: Diagnosis not present

## 2022-12-25 DIAGNOSIS — M1990 Unspecified osteoarthritis, unspecified site: Secondary | ICD-10-CM

## 2022-12-25 DIAGNOSIS — C541 Malignant neoplasm of endometrium: Secondary | ICD-10-CM | POA: Diagnosis not present

## 2022-12-25 DIAGNOSIS — Z9221 Personal history of antineoplastic chemotherapy: Secondary | ICD-10-CM | POA: Diagnosis not present

## 2022-12-25 DIAGNOSIS — C7989 Secondary malignant neoplasm of other specified sites: Secondary | ICD-10-CM | POA: Diagnosis not present

## 2022-12-25 DIAGNOSIS — C55 Malignant neoplasm of uterus, part unspecified: Secondary | ICD-10-CM | POA: Diagnosis not present

## 2022-12-25 HISTORY — PX: EXCISION MASS LOWER EXTREMETIES: SHX6705

## 2022-12-25 SURGERY — EXCISION MASS LOWER EXTREMITIES
Anesthesia: General | Site: Thigh | Laterality: Right

## 2022-12-25 MED ORDER — CHLORHEXIDINE GLUCONATE CLOTH 2 % EX PADS: 6.0000 | MEDICATED_PAD | Freq: Once | CUTANEOUS | Status: DC

## 2022-12-25 MED ORDER — MIDAZOLAM HCL 5 MG/5ML IJ SOLN
INTRAMUSCULAR | Status: DC | PRN
  Administered 2022-12-25: 2 mg via INTRAVENOUS

## 2022-12-25 MED ORDER — FENTANYL CITRATE (PF) 100 MCG/2ML IJ SOLN
INTRAMUSCULAR | Status: AC
  Filled 2022-12-25: qty 2

## 2022-12-25 MED ORDER — DEXAMETHASONE SODIUM PHOSPHATE 10 MG/ML IJ SOLN
INTRAMUSCULAR | Status: DC | PRN
  Administered 2022-12-25: 5 mg via INTRAVENOUS

## 2022-12-25 MED ORDER — BUPIVACAINE HCL (PF) 0.25 % IJ SOLN
INTRAMUSCULAR | Status: AC
  Filled 2022-12-25: qty 30

## 2022-12-25 MED ORDER — ONDANSETRON HCL 4 MG/2ML IJ SOLN
INTRAMUSCULAR | Status: AC
  Filled 2022-12-25: qty 2

## 2022-12-25 MED ORDER — EPHEDRINE SULFATE (PRESSORS) 50 MG/ML IJ SOLN
INTRAMUSCULAR | Status: DC | PRN
  Administered 2022-12-25 (×3): 5 mg via INTRAVENOUS

## 2022-12-25 MED ORDER — LIDOCAINE-EPINEPHRINE 1 %-1:100000 IJ SOLN
INTRAMUSCULAR | Status: AC
  Filled 2022-12-25: qty 1

## 2022-12-25 MED ORDER — BUPIVACAINE LIPOSOME 1.3 % IJ SUSP
INTRAMUSCULAR | Status: DC | PRN
  Administered 2022-12-25: 20 mL

## 2022-12-25 MED ORDER — LIDOCAINE HCL (CARDIAC) PF 100 MG/5ML IV SOSY
PREFILLED_SYRINGE | INTRAVENOUS | Status: DC | PRN
  Administered 2022-12-25: 40 mg via INTRAVENOUS

## 2022-12-25 MED ORDER — EPHEDRINE 5 MG/ML INJ
INTRAVENOUS | Status: AC
  Filled 2022-12-25: qty 5

## 2022-12-25 MED ORDER — CIPROFLOXACIN IN D5W 400 MG/200ML IV SOLN
INTRAVENOUS | Status: AC
  Filled 2022-12-25: qty 200

## 2022-12-25 MED ORDER — ROCURONIUM BROMIDE 10 MG/ML (PF) SYRINGE
PREFILLED_SYRINGE | INTRAVENOUS | Status: AC
  Filled 2022-12-25: qty 10

## 2022-12-25 MED ORDER — ACETAMINOPHEN 500 MG PO TABS
ORAL_TABLET | ORAL | Status: AC
  Filled 2022-12-25: qty 2

## 2022-12-25 MED ORDER — FENTANYL CITRATE (PF) 100 MCG/2ML IJ SOLN
INTRAMUSCULAR | Status: DC | PRN
  Administered 2022-12-25: 50 ug via INTRAVENOUS
  Administered 2022-12-25 (×2): 25 ug via INTRAVENOUS

## 2022-12-25 MED ORDER — BUPIVACAINE HCL (PF) 0.25 % IJ SOLN
INTRAMUSCULAR | Status: DC | PRN
  Administered 2022-12-25: 20 mL

## 2022-12-25 MED ORDER — PROPOFOL 10 MG/ML IV BOLUS
INTRAVENOUS | Status: DC | PRN
  Administered 2022-12-25: 150 mg via INTRAVENOUS
  Administered 2022-12-25: 50 mg via INTRAVENOUS

## 2022-12-25 MED ORDER — ACETAMINOPHEN 500 MG PO TABS
1000.0000 mg | ORAL_TABLET | ORAL | Status: AC
  Administered 2022-12-25: 1000 mg via ORAL

## 2022-12-25 MED ORDER — LIDOCAINE-EPINEPHRINE (PF) 1 %-1:200000 IJ SOLN
INTRAMUSCULAR | Status: AC
  Filled 2022-12-25: qty 90

## 2022-12-25 MED ORDER — ONDANSETRON HCL 4 MG/2ML IJ SOLN
INTRAMUSCULAR | Status: DC | PRN
  Administered 2022-12-25: 4 mg via INTRAVENOUS

## 2022-12-25 MED ORDER — 0.9 % SODIUM CHLORIDE (POUR BTL) OPTIME
TOPICAL | Status: DC | PRN
  Administered 2022-12-25: 75 mL

## 2022-12-25 MED ORDER — BUPIVACAINE LIPOSOME 1.3 % IJ SUSP
INTRAMUSCULAR | Status: AC
  Filled 2022-12-25: qty 20

## 2022-12-25 MED ORDER — DEXAMETHASONE SODIUM PHOSPHATE 10 MG/ML IJ SOLN
INTRAMUSCULAR | Status: AC
  Filled 2022-12-25: qty 1

## 2022-12-25 MED ORDER — CIPROFLOXACIN IN D5W 400 MG/200ML IV SOLN
400.0000 mg | INTRAVENOUS | Status: AC
  Administered 2022-12-25: 400 mg via INTRAVENOUS

## 2022-12-25 MED ORDER — LIDOCAINE 2% (20 MG/ML) 5 ML SYRINGE
INTRAMUSCULAR | Status: AC
  Filled 2022-12-25: qty 5

## 2022-12-25 MED ORDER — LACTATED RINGERS IV SOLN: INTRAVENOUS | Status: DC

## 2022-12-25 MED ORDER — MIDAZOLAM HCL 2 MG/2ML IJ SOLN
INTRAMUSCULAR | Status: AC
  Filled 2022-12-25: qty 2

## 2022-12-25 MED ORDER — HYDROCODONE-ACETAMINOPHEN 5-325 MG PO TABS: 1.0000 | ORAL_TABLET | Freq: Four times a day (QID) | ORAL | 0 refills | Status: DC | PRN

## 2022-12-25 MED ORDER — PROPOFOL 10 MG/ML IV BOLUS
INTRAVENOUS | Status: AC
  Filled 2022-12-25: qty 20

## 2022-12-25 SURGICAL SUPPLY — 67 items
ADH SKN CLS APL DERMABOND .7 (GAUZE/BANDAGES/DRESSINGS) ×1
APL PRP STRL LF DISP 70% ISPRP (MISCELLANEOUS) ×2
APL SKNCLS STERI-STRIP NONHPOA (GAUZE/BANDAGES/DRESSINGS) ×1
BENZOIN TINCTURE PRP APPL 2/3 (GAUZE/BANDAGES/DRESSINGS) IMPLANT
BLADE HEX COATED 2.75 (ELECTRODE) IMPLANT
BLADE SURG 10 STRL SS (BLADE) ×1 IMPLANT
BLADE SURG 15 STRL LF DISP TIS (BLADE) ×1 IMPLANT
BLADE SURG 15 STRL SS (BLADE) ×1
BNDG CMPR 5X4 CHSV STRCH STRL (GAUZE/BANDAGES/DRESSINGS) ×1
BNDG CMPR 5X62 HK CLSR LF (GAUZE/BANDAGES/DRESSINGS) ×2
BNDG CMPR 6"X 5 YARDS HK CLSR (GAUZE/BANDAGES/DRESSINGS) ×2
BNDG COHESIVE 4X5 TAN STRL LF (GAUZE/BANDAGES/DRESSINGS) IMPLANT
BNDG ELASTIC 6INX 5YD STR LF (GAUZE/BANDAGES/DRESSINGS) IMPLANT
CANISTER SUCT 1200ML W/VALVE (MISCELLANEOUS) IMPLANT
CHLORAPREP W/TINT 26 (MISCELLANEOUS) ×1 IMPLANT
CLIP TI MEDIUM 6 (CLIP) IMPLANT
COVER BACK TABLE 60X90IN (DRAPES) IMPLANT
COVER MAYO STAND STRL (DRAPES) IMPLANT
DERMABOND ADVANCED .7 DNX12 (GAUZE/BANDAGES/DRESSINGS) IMPLANT
DRAPE IMP U-DRAPE 54X76 (DRAPES) IMPLANT
DRAPE LAPAROSCOPIC ABDOMINAL (DRAPES) IMPLANT
DRAPE LAPAROTOMY 100X72 PEDS (DRAPES) IMPLANT
DRAPE UTILITY XL STRL (DRAPES) ×1 IMPLANT
DRSG TEGADERM 4X10 (GAUZE/BANDAGES/DRESSINGS) IMPLANT
ELECT REM PT RETURN 9FT ADLT (ELECTROSURGICAL) ×1
ELECTRODE REM PT RTRN 9FT ADLT (ELECTROSURGICAL) ×1 IMPLANT
GAUZE PAD ABD 8X10 STRL (GAUZE/BANDAGES/DRESSINGS) IMPLANT
GAUZE SPONGE 4X4 12PLY STRL (GAUZE/BANDAGES/DRESSINGS) IMPLANT
GAUZE SPONGE 4X4 12PLY STRL LF (GAUZE/BANDAGES/DRESSINGS) ×1 IMPLANT
GLOVE BIO SURGEON STRL SZ 6 (GLOVE) ×1 IMPLANT
GLOVE BIOGEL PI IND STRL 6.5 (GLOVE) ×1 IMPLANT
GLOVE BIOGEL PI IND STRL 7.5 (GLOVE) IMPLANT
GLOVE BIOGEL PI MICRO STRL 5.5 (GLOVE) IMPLANT
GLOVE SURG SS PI 7.0 STRL IVOR (GLOVE) IMPLANT
GOWN STRL REUS W/ TWL LRG LVL3 (GOWN DISPOSABLE) ×1 IMPLANT
GOWN STRL REUS W/TWL 2XL LVL3 (GOWN DISPOSABLE) ×1 IMPLANT
GOWN STRL REUS W/TWL LRG LVL3 (GOWN DISPOSABLE) ×2
NDL HYPO 25X1 1.5 SAFETY (NEEDLE) ×1 IMPLANT
NEEDLE HYPO 25X1 1.5 SAFETY (NEEDLE) ×1 IMPLANT
NS IRRIG 1000ML POUR BTL (IV SOLUTION) ×1 IMPLANT
PACK BASIN DAY SURGERY FS (CUSTOM PROCEDURE TRAY) ×1 IMPLANT
PACK UNIVERSAL I (CUSTOM PROCEDURE TRAY) IMPLANT
PENCIL SMOKE EVACUATOR (MISCELLANEOUS) ×1 IMPLANT
SLEEVE SCD COMPRESS KNEE MED (STOCKING) IMPLANT
SLEEVE SURGEON STRL (DRAPES) IMPLANT
SPIKE FLUID TRANSFER (MISCELLANEOUS) IMPLANT
SPONGE T-LAP 18X18 ~~LOC~~+RFID (SPONGE) ×1 IMPLANT
STAPLER VISISTAT 35W (STAPLE) IMPLANT
STOCKINETTE IMPERVIOUS LG (DRAPES) IMPLANT
STRIP CLOSURE SKIN 1/2X4 (GAUZE/BANDAGES/DRESSINGS) IMPLANT
SUT ETHILON 2 0 FS 18 (SUTURE) IMPLANT
SUT ETHILON 2 0 FSLX (SUTURE) IMPLANT
SUT ETHILON 3 0 PS 1 (SUTURE) IMPLANT
SUT ETHILON 4 0 PS 2 18 (SUTURE) IMPLANT
SUT MNCRL AB 4-0 PS2 18 (SUTURE) ×1 IMPLANT
SUT SILK 2 0 SH (SUTURE) IMPLANT
SUT SILK 3 0 TIES 17X18 (SUTURE)
SUT SILK 3-0 18XBRD TIE BLK (SUTURE) IMPLANT
SUT VIC AB 2-0 SH 27 (SUTURE) ×4
SUT VIC AB 2-0 SH 27XBRD (SUTURE) IMPLANT
SUT VIC AB 3-0 SH 27 (SUTURE) ×4
SUT VIC AB 3-0 SH 27X BRD (SUTURE) ×1 IMPLANT
SYR BULB EAR ULCER 3OZ GRN STR (SYRINGE) ×1 IMPLANT
SYR CONTROL 10ML LL (SYRINGE) ×1 IMPLANT
TOWEL GREEN STERILE FF (TOWEL DISPOSABLE) ×1 IMPLANT
TUBE CONNECTING 20X1/4 (TUBING) IMPLANT
YANKAUER SUCT BULB TIP NO VENT (SUCTIONS) IMPLANT

## 2022-12-25 NOTE — Interval H&P Note (Signed)
History and Physical Interval Note:  12/25/2022 10:48 AM  Dana [name]  has presented today for surgery, with the diagnosis of RIGHT THIGH MASS.  The various methods of treatment have been discussed with the patient and family. After consideration of risks, benefits and other options for treatment, the patient has consented to  Procedure(s): EXCISION OF RIGHT THIGH MASS, MALIGNANT AND ADVANCEMENT FLAP CLOSURE (Right) as a surgical intervention.  The patient's history has been reviewed, patient examined, no change in status, stable for surgery.  I have reviewed the patient's chart and labs.  Questions were answered to the patient's satisfaction.     [name]

## 2022-12-25 NOTE — Transfer of Care (Signed)
Immediate Anesthesia Transfer of Care Note  Patient: Dana [name]  Procedure(s) Performed: EXCISION OF RIGHT THIGH MASS, MALIGNANT AND ADVANCEMENT FLAP CLOSURE (Right: Thigh)  Patient Location: PACU  Anesthesia Type:General  Level of Consciousness: drowsy  Airway & Oxygen Therapy: Patient Spontanous Breathing and Patient connected to face mask oxygen  Post-op Assessment: Report given to RN and Post -op Vital signs reviewed and stable  Post vital signs: Reviewed and stable  Last Vitals:  Vitals Value Taken Time  BP 151/78 (98)   Temp    Pulse 71 12/25/22 1255  Resp 12 12/25/22 1255  SpO2 100 % 12/25/22 1255  Vitals shown include unvalidated device data.  Last Pain:  Vitals:   12/25/22 0851  TempSrc: Oral  PainSc: 0-No pain      Patients Stated Pain Goal: 3 (12/25/22 0851)  Complications: No notable events documented.

## 2022-12-25 NOTE — Anesthesia Preprocedure Evaluation (Addendum)
Anesthesia Evaluation  Patient identified by MRN, date of birth, ID band Patient awake    Reviewed: Allergy & Precautions, NPO status , Patient's Chart, lab work & pertinent test results  Airway Mallampati: II  TM Distance: >3 FB Neck ROM: Full    Dental  (+) Teeth Intact, Dental Advisory Given   Pulmonary neg pulmonary ROS   Pulmonary exam normal breath sounds clear to auscultation       Cardiovascular negative cardio ROS Normal cardiovascular exam Rhythm:Regular Rate:Normal     Neuro/Psych negative neurological ROS  negative psych ROS   GI/Hepatic Neg liver ROS,GERD  Controlled,,  Endo/Other  Hypothyroidism    Renal/GU negative Renal ROS  negative genitourinary   Musculoskeletal  (+) Arthritis , Osteoarthritis,    Abdominal   Peds  Hematology negative hematology ROS (+)   Anesthesia Other Findings R thigh mass  Reproductive/Obstetrics negative OB ROS                              Anesthesia Physical Anesthesia Plan  ASA: 2  Anesthesia Plan: General   Post-op Pain Management: Tylenol PO (pre-op)*   Induction: Intravenous  PONV Risk Score and Plan: 3 and Dexamethasone, Ondansetron and Treatment may vary due to age or medical condition  Airway Management Planned: LMA  Additional Equipment: None  Intra-op Plan:   Post-operative Plan: Extubation in OR  Informed Consent: I have reviewed the patients History and Physical, chart, labs and discussed the procedure including the risks, benefits and alternatives for the proposed anesthesia with the patient or authorized representative who has indicated his/her understanding and acceptance.     Dental advisory given  Plan Discussed with: CRNA  Anesthesia Plan Comments:        Anesthesia Quick Evaluation

## 2022-12-25 NOTE — Anesthesia Postprocedure Evaluation (Signed)
Anesthesia Post Note  Patient: Dana [name]  Procedure(s) Performed: EXCISION OF RIGHT THIGH MASS, MALIGNANT AND ADVANCEMENT FLAP CLOSURE (Right: Thigh)     Patient location during evaluation: PACU Anesthesia Type: General Level of consciousness: awake and alert, oriented and patient cooperative Pain management: pain level controlled Vital Signs Assessment: post-procedure vital signs reviewed and stable Respiratory status: spontaneous breathing, nonlabored ventilation and respiratory function stable Cardiovascular status: blood pressure returned to baseline and stable Postop Assessment: no apparent nausea or vomiting Anesthetic complications: no   No notable events documented.  Last Vitals:  Vitals:   12/25/22 1300 12/25/22 1315  BP: (!) 146/81 (!) 144/80  Pulse: 67 71  Resp: 15 14  Temp:    SpO2: 100% 97%    Last Pain:  Vitals:   12/25/22 1335  TempSrc:   PainSc: 2                  Lannie Fields

## 2022-12-25 NOTE — Anesthesia Procedure Notes (Signed)
Procedure Name: LMA Insertion Date/Time: 12/25/2022 11:07 AM  Performed by: Lauralyn Primes, CRNAPre-anesthesia Checklist: Patient identified, Emergency Drugs available, Suction available and Patient being monitored Patient Re-evaluated:Patient Re-evaluated prior to induction Oxygen Delivery Method: Circle system utilized Preoxygenation: Pre-oxygenation with 100% oxygen Induction Type: IV induction Ventilation: Mask ventilation without difficulty LMA: LMA inserted LMA Size: 4.0 Number of attempts: 1 Airway Equipment and Method: Bite block Placement Confirmation: positive ETCO2 Tube secured with: Tape Dental Injury: Teeth and Oropharynx as per pre-operative assessment

## 2022-12-25 NOTE — Discharge Instructions (Addendum)
[facility] Office Phone Number [phone]   POST OP INSTRUCTIONS  Always review your discharge instruction sheet given to you by the facility where your surgery was performed.  IF YOU HAVE DISABILITY OR FAMILY LEAVE FORMS, YOU MUST BRING THEM TO THE OFFICE FOR PROCESSING.  DO NOT GIVE THEM TO YOUR DOCTOR.  Take 2 tylenol (acetominophen) three times a day for 3 days.  If you still have pain, add ibuprofen with food in between if able to take this (if you have kidney issues or stomach issues, do not take ibuprofen).  If both of those are not enough, add the narcotic pain pill.  If you find you are needing a lot of this overnight after surgery, call the next morning for a refill.   Take your usually prescribed medications unless otherwise directed If you need a refill on your pain medication, please contact your pharmacy.  They will contact our office to request authorization.  Prescriptions will not be filled after 5pm or on week-ends. You should eat very light the first 24 hours after surgery, such as soup, crackers, pudding, etc.  Resume your normal diet the day after surgery It is common to experience some constipation if taking pain medication after surgery.  Increasing fluid intake and taking a stool softener will usually help or prevent this problem from occurring.  A mild laxative (Milk of Magnesia or Miralax) should be taken according to package directions if there are no bowel movements after 48 hours. You may remove the ace wrap and shower in 48 hours.  The surgical glue will flake off in 2-3 weeks.   After shower, remove plastic dressing with gauze.  Recover with gauze or maxi pad and ace wrap or tape.  The ace wrap usually helps with swelling and makes it hurt less.  Also, the less tape you use, the less likely you are to have skin issues.   ACTIVITIES:  No strenuous activity or heavy lifting for 2-4 weeks and not before follow up appointment.  .   You may drive when you  no longer are taking prescription pain medication, you can comfortably wear a seatbelt, and you can safely maneuver your car and apply brakes. RETURN TO WORK:  __________to be determined. _______________ Dana [name] should see your doctor in the office for a follow-up appointment approximately three-four weeks after your surgery.    WHEN TO CALL YOUR DOCTOR: Fever over 101.0 Nausea and/or vomiting. Extreme swelling or bruising. Continued bleeding from incision. Increased pain, redness, or drainage from the incision.  The clinic staff is available to answer your questions during regular business hours.  Please don't hesitate to call and ask to speak to one of the nurses for clinical concerns.  If you have a medical emergency, go to the nearest emergency room or call 911.  A surgeon from [facility] is always on call at the hospital.  For further questions, please visit [email or web address]    Information for Discharge Teaching: EXPAREL (bupivacaine liposome injectable suspension)   Your surgeon or anesthesiologist gave you EXPAREL(bupivacaine) to help control your pain after surgery.  EXPAREL is a local anesthetic that provides pain relief by numbing the tissue around the surgical site. EXPAREL is designed to release pain medication over time and can control pain for up to 72 hours. Depending on how you respond to EXPAREL, you may require less pain medication during your recovery.  Possible side effects: Temporary loss of sensation or ability to move in the area where  bupivacaine was injected. Nausea, vomiting, constipation Rarely, numbness and tingling in your mouth or lips, lightheadedness, or anxiety may occur. Call your doctor right away if you think you may be experiencing any of these sensations, or if you have other questions regarding possible side effects.  Follow all other discharge instructions given to you by your surgeon or nurse. Eat a healthy diet and drink  plenty of water or other fluids.  If you return to the hospital for any reason within 96 hours following the administration of EXPAREL, it is important for health care providers to know that you have received this anesthetic. A teal colored band has been placed on your arm with the date, time and amount of EXPAREL you have received in order to alert and inform your health care providers. Please leave this armband in place for the full 96 hours following administration, and then you may remove the band.Information for Discharge Teaching: EXPAREL (bupivacaine liposome injectable suspension)   Your surgeon or anesthesiologist gave you EXPAREL(bupivacaine) to help control your pain after surgery.  EXPAREL is a local anesthetic that provides pain relief by numbing the tissue around the surgical site. EXPAREL is designed to release pain medication over time and can control pain for up to 72 hours. Depending on how you respond to EXPAREL, you may require less pain medication during your recovery.  Possible side effects: Temporary loss of sensation or ability to move in the area where bupivacaine was injected. Nausea, vomiting, constipation Rarely, numbness and tingling in your mouth or lips, lightheadedness, or anxiety may occur. Call your doctor right away if you think you may be experiencing any of these sensations, or if you have other questions regarding possible side effects.  Follow all other discharge instructions given to you by your surgeon or nurse. Eat a healthy diet and drink plenty of water or other fluids.  If you return to the hospital for any reason within 96 hours following the administration of EXPAREL, it is important for health care providers to know that you have received this anesthetic. A teal colored band has been placed on your arm with the date, time and amount of EXPAREL you have received in order to alert and inform your health care providers. Please leave this armband in place  for the full 96 hours following administration, and then you may remove the band.  No tylenol until after 3pm if needed.

## 2022-12-25 NOTE — Op Note (Addendum)
Operative Note  Patient: Dana [name], [place] MRN: [id number]  Pre-operative Diagnosis: stage IV endometrial adenocarcinoma, right thigh mass  Post-operative Diagnosis: same  Surgeon: [name], MD - Primary Josepha Pigg, MD - Assistant   Procedure:   Wide local excision of malignant right thigh mass, 5 cm Advancement flap closure for defect 14 x 9 cm Wound closure in layers  Anesthesia: general  Indication: [name] is a 72 year-old female with stage IV endometrial cancer s/p TAH/BSO/appendectomy/hepatic wedge resection and chemotherapy, with inguinal/iliac node recurrence, now with enlarging right anterior thigh subcutaneous mass noted initially in 2023. Given its increased size and that it is now symptomatic, she opted for excision with primary wound closure, including mucosal advancement flap. She demonstrated understanding of the benefits/risks and opted to proceed with surgery.  Description of Procedure: The patient was positioned supine on the operating table, and general anesthesia was induced without complication. The correct laterality and operative site were confirmed, and the patient was prepped and draped in routine sterile fashion. A time-out was performed.  An elliptical incision was made to include the subcutaneous mass located on the anterolateral right thigh. Dissection was performed down to the fascia lata using electrocautery, ensuring excellent hemostasis throughout. The mass with adjacent subcutaneous tissue to obtain adequate gross margins measured 14 x 9.5 x 2 cm, and was removed and marked with a short suture tag representing the superior margin and long suture tag for the lateral margin. It was then sent off to Pathology as permanent specimen for analysis. The wound was irrigated, and again good hemostasis was ensured.  The mucosal plane was identified, and a circumferential tissue flap was created with electrocautery, taking care not to make the flap too  thin while also preserving as much of the blood supply as possible. Then, the wound was re-approximated in layers, first by closure of the deep tissue with interrupted 2-0 Vicryl sutures, followed by the dermis with interrupted 3-0 Vicryl and five vertical mattress 2-0 Nylon sutures, and running 4-0 Monocryl for the skin. The wound was dressed in sterile fashion, and leg wrapped with Ace bandage.  All instrument, sponge, and needle counts were correct. There were no immediate postoperative complications. The patient was extubated and transported in hemodynamically stable condition to the PACU.   Norberta Keens, MD  PGY-3, General Surgery   Attending Addendum:  Dr. Donell Beers was present for all key portions of the above procedure and immediately available via phone/pager throughout, and agrees with the report as written above.

## 2022-12-26 ENCOUNTER — Encounter (HOSPITAL_BASED_OUTPATIENT_CLINIC_OR_DEPARTMENT_OTHER): Payer: Self-pay | Admitting: General Surgery

## 2022-12-27 DIAGNOSIS — I890 Lymphedema, not elsewhere classified: Secondary | ICD-10-CM | POA: Diagnosis not present

## 2023-01-08 ENCOUNTER — Other Ambulatory Visit (HOSPITAL_BASED_OUTPATIENT_CLINIC_OR_DEPARTMENT_OTHER): Payer: Self-pay | Admitting: General Surgery

## 2023-01-08 ENCOUNTER — Inpatient Hospital Stay: Payer: Medicare HMO | Attending: Hematology and Oncology | Admitting: Oncology

## 2023-01-08 VITALS — BP 143/82 | HR 81 | Temp 98.1°F | Resp 18 | Ht 62.0 in | Wt 173.5 lb

## 2023-01-08 DIAGNOSIS — Z8542 Personal history of malignant neoplasm of other parts of uterus: Secondary | ICD-10-CM | POA: Diagnosis not present

## 2023-01-08 DIAGNOSIS — C7989 Secondary malignant neoplasm of other specified sites: Secondary | ICD-10-CM | POA: Diagnosis not present

## 2023-01-08 DIAGNOSIS — C774 Secondary and unspecified malignant neoplasm of inguinal and lower limb lymph nodes: Secondary | ICD-10-CM | POA: Insufficient documentation

## 2023-01-08 DIAGNOSIS — C55 Malignant neoplasm of uterus, part unspecified: Secondary | ICD-10-CM | POA: Diagnosis not present

## 2023-01-08 DIAGNOSIS — L03115 Cellulitis of right lower limb: Secondary | ICD-10-CM

## 2023-01-08 NOTE — Progress Notes (Signed)
[facility] OFFICE PROGRESS NOTE   Diagnosis: Endometrial cancer  INTERVAL HISTORY:   Dana [name] returns as scheduled.  She underwent resection of the right thigh mass by Dr. Donell Beers on 12/25/2022.  The pathology confirmed metastatic endometrial cancer.  Section margins were negative.  She reports erythema, pain, and swelling at the surgical site.  She had a fever last week.  She was started on Bactrim for cellulitis at the surgical site 01/05/2023.  She has developed swelling in the right lower leg for the past few days.  She had pain in the right calf a few days ago.  This has resolved.  No current fever.  Objective:  Vital signs in last 24 hours:  Blood pressure (!) 143/82, pulse 81, temperature 98.1 F (36.7 C), temperature source Oral, resp. rate 18, height 5\' 2"  (1.575 m), weight 173 lb 8 oz (78.7 kg), SpO2 99 %.    Lymphatics: Firm nodules at the right inguinal crease with a firm node deep to this area Resp: Lungs clear bilaterally Cardio: Regular rate and rhythm GI: No hepatosplenomegaly Vascular: Edema throughout the left leg, trace edema at the right lower leg.  Skin: Sutures and Steri-Strips in place at the right thigh with mild surrounding erythema.  Fluctuance inferior to the surgical incision  Lab Results:  Lab Results  Component Value Date   WBC 5.4 06/26/2022   HGB 14.1 06/26/2022   HCT 42.1 06/26/2022   MCV 92.7 06/26/2022   PLT 256 06/26/2022   NEUTROABS 3.6 06/26/2022    CMP  Lab Results  Component Value Date   NA 139 06/26/2022   K 3.9 06/26/2022   CL 102 06/26/2022   CO2 27 06/26/2022   GLUCOSE 96 06/26/2022   BUN 9 06/26/2022   CREATININE 0.81 06/26/2022   CALCIUM 9.2 06/26/2022   PROT 6.2 (L) 06/26/2022   ALBUMIN 3.8 06/26/2022   AST 15 06/26/2022   ALT 11 06/26/2022   ALKPHOS 65 06/26/2022   BILITOT 0.5 06/26/2022   GFRNONAA >60 06/26/2022   GFRAA >60 04/06/2020    No results found for: "CEA1", "CEA", "MWU132",  "CA125"  Lab Results  Component Value Date   INR 0.9 05/15/2019   LABPROT 11.6 05/15/2019    Imaging:  No results found.  Medications: I have reviewed the patient's current medications.   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, status post a TAH, BSO, appendectomy, pelvic and periaortic lymphadenectomy, and wedge resection of a liver lesion Gross complete resection, tumor involve both ovaries, lymph nodes negative 6 cycles of adjuvant Taxol/carboplatin CT abdomen/pelvis 03/28/2019-enlarged right inguinal and iliac nodes 04/10/2019-right inguinal lymph node biopsy-metastatic adenocarcinoma, ER 90%, PR 95%, PD-L1 combined positive score-1, MSS, tumor mutation burden-4 Cisplatin 05/16/2019 20-1 cycle Radiation to the lateral pelvis and 1 lymph nodes November 2020 CT 12/72/020-decrease size of right inguinal and external iliac lymph nodes, no evidence of disease progression Everolimus and letrozole 07/14/2019 - 04/06/2020 CT 10/20/2019-resolution of right iliac lymph node, decrease in size of right inguinal node CT 03/08/2020-no evidence of recurrent or metastatic disease in the abdomen or pelvis CT 05/31/2020-no evidence of recurrent or metastatic disease CT 03/11/2021-increase right inguinal lymph node, enlargement of bilateral inguinal lymph nodes, subcutaneous soft tissue nodule at the anterior right thigh Carboplatin 04/06/2021, 04/25/2021, 05/16/2021 CT 07/12/2021-mild decrease in size of bilateral inguinal lymphadenopathy, no progressive disease in the abdomen or pelvis, 0.6 cm left inguinal node was 0.8 cm previously Letrozole 08/06/2021 CT abdomen/pelvis 04/10/2022-stable right  inguinal lymph node, no new site of metastatic disease CT right femur  04/10/2022 -2.2 x 1 cm anterior subcutaneous mass Letrozole continued CT right femur 07/19/2022-mild enlargement of right thigh subcutaneous mass stable right inguinal nodal mass CTs  07/19/2022-stable right inguinal lymph node, no new or progressive disease 11/20/2022-right thigh mass larger and tender, letrozole discontinued 11/28/2022-significant enlargement of anterior right thigh soft tissue mass, slightly larger right inguinal mass 12/25/2022-wide local excision of right thigh mass, metastatic endometrial adenocarcinoma, ER positive, PR positive, negative resection margins  2.  Left lower extremity lymphedema, improved after therapy in the lymphedema clinic  3.  Chronic diarrhea controlled with Lomotil      Disposition: [name] underwent wide excision of the right thigh mass on 12/25/2022.  The pathology confirmed metastatic endometrial cancer with negative margins.  She has pain, swelling, and erythema at the surgical site.  She is now on Bactrim.  There is mild swelling in the right lower leg.  She is scheduled to see Dr. Donell Beers later this morning.  She will consult with Dr. Donell Beers regarding the indication for a right leg Doppler.  The plan is to consider treatment with pembrolizumab and lenvatinib when she has fully recovered from surgery.  She will return for an office visit in 3 weeks. Thornton Papas, MD  01/08/2023  9:00 AM

## 2023-01-09 ENCOUNTER — Ambulatory Visit (HOSPITAL_BASED_OUTPATIENT_CLINIC_OR_DEPARTMENT_OTHER)
Admission: RE | Admit: 2023-01-09 | Discharge: 2023-01-09 | Disposition: A | Discharge status: Home / Self Care | Payer: Medicare HMO | Source: Ambulatory Visit | Attending: General Surgery | Admitting: General Surgery

## 2023-01-09 ENCOUNTER — Telehealth: Payer: Self-pay | Admitting: Oncology

## 2023-01-09 DIAGNOSIS — L03115 Cellulitis of right lower limb: Secondary | ICD-10-CM | POA: Diagnosis not present

## 2023-01-09 DIAGNOSIS — D649 Anemia, unspecified: Secondary | ICD-10-CM | POA: Diagnosis not present

## 2023-01-09 DIAGNOSIS — E039 Hypothyroidism, unspecified: Secondary | ICD-10-CM | POA: Diagnosis not present

## 2023-01-09 DIAGNOSIS — E785 Hyperlipidemia, unspecified: Secondary | ICD-10-CM | POA: Diagnosis not present

## 2023-01-09 DIAGNOSIS — E559 Vitamin D deficiency, unspecified: Secondary | ICD-10-CM | POA: Diagnosis not present

## 2023-01-09 DIAGNOSIS — R7989 Other specified abnormal findings of blood chemistry: Secondary | ICD-10-CM | POA: Diagnosis not present

## 2023-01-09 DIAGNOSIS — M79604 Pain in right leg: Secondary | ICD-10-CM | POA: Diagnosis not present

## 2023-01-09 DIAGNOSIS — Z0000 Encounter for general adult medical examination without abnormal findings: Secondary | ICD-10-CM | POA: Diagnosis not present

## 2023-01-09 DIAGNOSIS — I10 Essential (primary) hypertension: Secondary | ICD-10-CM | POA: Diagnosis not present

## 2023-01-09 DIAGNOSIS — R2241 Localized swelling, mass and lump, right lower limb: Secondary | ICD-10-CM | POA: Diagnosis not present

## 2023-01-16 DIAGNOSIS — I700 Atherosclerosis of aorta: Secondary | ICD-10-CM | POA: Diagnosis not present

## 2023-01-16 DIAGNOSIS — Z1339 Encounter for screening examination for other mental health and behavioral disorders: Secondary | ICD-10-CM | POA: Diagnosis not present

## 2023-01-16 DIAGNOSIS — C774 Secondary and unspecified malignant neoplasm of inguinal and lower limb lymph nodes: Secondary | ICD-10-CM | POA: Diagnosis not present

## 2023-01-16 DIAGNOSIS — Z0000 Encounter for general adult medical examination without abnormal findings: Secondary | ICD-10-CM | POA: Diagnosis not present

## 2023-01-16 DIAGNOSIS — G620 Drug-induced polyneuropathy: Secondary | ICD-10-CM | POA: Diagnosis not present

## 2023-01-16 DIAGNOSIS — I10 Essential (primary) hypertension: Secondary | ICD-10-CM | POA: Diagnosis not present

## 2023-01-16 DIAGNOSIS — Z1331 Encounter for screening for depression: Secondary | ICD-10-CM | POA: Diagnosis not present

## 2023-01-16 DIAGNOSIS — I890 Lymphedema, not elsewhere classified: Secondary | ICD-10-CM | POA: Diagnosis not present

## 2023-01-16 DIAGNOSIS — C55 Malignant neoplasm of uterus, part unspecified: Secondary | ICD-10-CM | POA: Diagnosis not present

## 2023-01-16 DIAGNOSIS — R82998 Other abnormal findings in urine: Secondary | ICD-10-CM | POA: Diagnosis not present

## 2023-01-16 DIAGNOSIS — E669 Obesity, unspecified: Secondary | ICD-10-CM | POA: Diagnosis not present

## 2023-01-16 DIAGNOSIS — E785 Hyperlipidemia, unspecified: Secondary | ICD-10-CM | POA: Diagnosis not present

## 2023-01-16 DIAGNOSIS — R197 Diarrhea, unspecified: Secondary | ICD-10-CM | POA: Diagnosis not present

## 2023-01-22 ENCOUNTER — Telehealth: Payer: Self-pay | Admitting: Oncology

## 2023-01-22 NOTE — Telephone Encounter (Signed)
Spoke with patient confirming upcoming appointment  

## 2023-01-22 NOTE — Telephone Encounter (Signed)
Spoke with patient confirming upcoming appointment change  

## 2023-01-23 ENCOUNTER — Other Ambulatory Visit (HOSPITAL_COMMUNITY): Payer: Self-pay

## 2023-01-23 ENCOUNTER — Encounter: Payer: Self-pay | Admitting: Oncology

## 2023-01-23 ENCOUNTER — Telehealth: Payer: Self-pay

## 2023-01-23 ENCOUNTER — Inpatient Hospital Stay: Payer: Medicare HMO | Attending: Hematology and Oncology | Admitting: Oncology

## 2023-01-23 ENCOUNTER — Other Ambulatory Visit: Payer: Self-pay

## 2023-01-23 VITALS — BP 130/78 | HR 88 | Temp 98.1°F | Resp 18 | Ht 62.0 in | Wt 171.4 lb

## 2023-01-23 DIAGNOSIS — C541 Malignant neoplasm of endometrium: Secondary | ICD-10-CM | POA: Insufficient documentation

## 2023-01-23 DIAGNOSIS — C774 Secondary and unspecified malignant neoplasm of inguinal and lower limb lymph nodes: Secondary | ICD-10-CM | POA: Insufficient documentation

## 2023-01-23 DIAGNOSIS — C55 Malignant neoplasm of uterus, part unspecified: Secondary | ICD-10-CM | POA: Diagnosis not present

## 2023-01-23 DIAGNOSIS — Z7189 Other specified counseling: Secondary | ICD-10-CM

## 2023-01-23 DIAGNOSIS — Z79620 Long term (current) use of immunosuppressive biologic: Secondary | ICD-10-CM | POA: Diagnosis not present

## 2023-01-23 DIAGNOSIS — Z5112 Encounter for antineoplastic immunotherapy: Secondary | ICD-10-CM | POA: Insufficient documentation

## 2023-01-23 DIAGNOSIS — C7989 Secondary malignant neoplasm of other specified sites: Secondary | ICD-10-CM | POA: Diagnosis not present

## 2023-01-23 MED ORDER — LENVATINIB (20 MG DAILY DOSE) 2 X 10 MG PO CPPK
20.0000 mg | ORAL_CAPSULE | Freq: Every day | ORAL | 0 refills | Status: DC
  Filled 2023-01-23: qty 60, 30d supply, fill #0

## 2023-01-23 NOTE — Telephone Encounter (Addendum)
Oral Oncology Patient Advocate Encounter  Prior Authorization for [place]un[place] has been approved.    PA# [id number]  Effective dates: 07/17/22 through 07/17/23  Patients co-pay is $3,315.72.   PAP initiated. See additional encounter.   Ardeen Fillers, CPhT Oncology Pharmacy Patient Advocate  [facility]  [phone] (phone) [phone] (fax) 01/23/2023 3:44 PM

## 2023-01-23 NOTE — Telephone Encounter (Signed)
Oral Oncology Patient Advocate Encounter  New authorization   Received notification that prior authorization for Lenvima is required.   PA submitted on 01/23/23  Key [id number]  Status is pending     Ardeen Fillers, CPhT Oncology Pharmacy Patient Advocate  [facility]  [phone] (phone) [phone] (fax) 01/23/2023 3:31 PM

## 2023-01-23 NOTE — Progress Notes (Signed)
DISCONTINUE ON PATHWAY REGIMEN - Uterine     A cycle is every 21 days:     Paclitaxel      Carboplatin   **Always confirm dose/schedule in your pharmacy ordering system**  REASON: Disease Progression PRIOR TREATMENT: UTOS173: Carboplatin AUC=6 + Paclitaxel 175 mg/m2 q21 Days Until Progression or Unacceptable Toxicity TREATMENT RESPONSE: Partial Response (PR)  START ON PATHWAY REGIMEN - Uterine     A cycle is every 21 days:     Lenvatinib      Pembrolizumab   **Always confirm dose/schedule in your pharmacy ordering system**  Patient Characteristics: Endometrioid, Recurrent/Progressive Disease, Third Line and Beyond, MSS/pMMR Histology: Endometrioid Therapeutic Status: Recurrent or Progressive Disease Microsatellite/Mismatch Repair Status: MSS/pMMR Line of Therapy: Third Line and Beyond  Intent of Therapy: Non-Curative / Palliative Intent, Discussed with Patient

## 2023-01-23 NOTE — Telephone Encounter (Signed)
Oral Oncology Patient Advocate Encounter   Began application for assistance for Lenvima through [facility] Patient Assistance Program.   Application will be submitted upon completion of necessary supporting documentation.   Eisai's phone number [phone].   I will continue to check the status until final determination.    Ardeen Fillers, CPhT Oncology Pharmacy Patient Advocate  [facility]  [phone] (phone) [phone] (fax) 01/23/2023 4:11 PM

## 2023-01-23 NOTE — Progress Notes (Signed)
[facility] OFFICE PROGRESS NOTE   Diagnosis: Endometrial cancer  INTERVAL HISTORY:   Dana [name] returns prior to her scheduled visit.  She reports the mass in the right inguinal region has increased in size and is now "sore ".  The surgical site at the right thigh is healing.  She completed the course of antibiotics.  Objective:  Vital signs in last 24 hours:  Blood pressure 130/78, pulse 88, temperature 98.1 F (36.7 C), temperature source Oral, resp. rate 18, height 5\' 2"  (1.575 m), weight 171 lb 6.4 oz (77.7 kg), SpO2 98 %.    Lymphatics: No cervical, supraclavicular, right axillary, or left inguinal nodes.  Soft mobile 1-1.5 cm left axillary node.  Firm 4-5 cm mass in the right inguinal area with visible purple discoloration invading the skin Resp: Lungs clear bilaterally Cardio: Regular rate and rhythm GI: No hepatosplenomegaly, no mass, nontender Vascular: Tense edema throughout the right leg  Skin: Healed surgical incision at the right thigh with faint surrounding erythema   Lab Results:  Lab Results  Component Value Date   WBC 5.4 06/26/2022   HGB 14.1 06/26/2022   HCT 42.1 06/26/2022   MCV 92.7 06/26/2022   PLT 256 06/26/2022   NEUTROABS 3.6 06/26/2022    CMP  Lab Results  Component Value Date   NA 139 06/26/2022   K 3.9 06/26/2022   CL 102 06/26/2022   CO2 27 06/26/2022   GLUCOSE 96 06/26/2022   BUN 9 06/26/2022   CREATININE 0.81 06/26/2022   CALCIUM 9.2 06/26/2022   PROT 6.2 (L) 06/26/2022   ALBUMIN 3.8 06/26/2022   AST 15 06/26/2022   ALT 11 06/26/2022   ALKPHOS 65 06/26/2022   BILITOT 0.5 06/26/2022   GFRNONAA >60 06/26/2022   GFRAA >60 04/06/2020     Medications: I have reviewed the patient's current medications.   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, status post a TAH, BSO, appendectomy, pelvic and periaortic lymphadenectomy, and wedge resection of a liver lesion Gross  complete resection, tumor involve both ovaries, lymph nodes negative 6 cycles of adjuvant Taxol/carboplatin CT abdomen/pelvis 03/28/2019-enlarged right inguinal and iliac nodes 04/10/2019-right inguinal lymph node biopsy-metastatic adenocarcinoma, ER 90%, PR 95%, PD-L1 combined positive score-1, MSS, tumor mutation burden-4 Cisplatin 05/16/2019 20-1 cycle Radiation to the lateral pelvis and 1 lymph nodes November 2020 CT 12/72/020-decrease size of right inguinal and external iliac lymph nodes, no evidence of disease progression Everolimus and letrozole 07/14/2019 - 04/06/2020 CT 10/20/2019-resolution of right iliac lymph node, decrease in size of right inguinal node CT 03/08/2020-no evidence of recurrent or metastatic disease in the abdomen or pelvis CT 05/31/2020-no evidence of recurrent or metastatic disease CT 03/11/2021-increase right inguinal lymph node, enlargement of bilateral inguinal lymph nodes, subcutaneous soft tissue nodule at the anterior right thigh Carboplatin 04/06/2021, 04/25/2021, 05/16/2021 CT 07/12/2021-mild decrease in size of bilateral inguinal lymphadenopathy, no progressive disease in the abdomen or pelvis, 0.6 cm left inguinal node was 0.8 cm previously Letrozole 08/06/2021 CT abdomen/pelvis 04/10/2022-stable right inguinal lymph node, no new site of metastatic disease CT right femur  04/10/2022 -2.2 x 1 cm anterior subcutaneous mass Letrozole continued CT right femur 07/19/2022-mild enlargement of right thigh subcutaneous mass stable right inguinal nodal mass CTs 07/19/2022-stable right inguinal lymph node, no new or progressive disease 11/20/2022-right thigh mass larger and tender, letrozole discontinued 11/28/2022-significant enlargement of anterior right thigh soft tissue mass, slightly larger right inguinal mass 12/25/2022-wide local excision of right thigh mass, metastatic  endometrial adenocarcinoma, ER positive, PR positive, negative resection margins Cycle 1  pembrolizumab/lenvatinib 01/30/2023  2.  Left lower extremity lymphedema, improved after therapy in the lymphedema clinic  3.  Chronic diarrhea controlled with Lomotil      Disposition: Dana [name] has metastatic endometrial cancer.  She underwent excision of a right anterior thigh mass in May.  The incision has healed and the surrounding infection has resolved.  She is symptomatic with a progressive nodal mass in the right inguinal area.  We discussed treatment options.  I recommend pembrolizumab/lenvatinib.  We reviewed potential toxicities associated with lenvatinib including the chance of hypertension, venous/arterial thromboembolic disease, bleeding, bowel perforation, rash, diarrhea, hand/foot syndrome, proteinuria, and hematologic toxicity.  We discussed the rash, diarrhea, and various autoimmune toxicities associated with pembrolizumab.  She agrees to proceed.  She is given read materials on pembrolizumab and lenvatinib today.  Dana [name] will begin cycle 1 pembrolizumab/lenvatinib on 01/30/2023.  A treatment plan was entered today.  She will return for an office and lab visit prior to cycle 2 embolism.  She will have baseline labs on 01/30/2023.  Thornton Papas, MD  01/23/2023  2:25 PM

## 2023-01-24 ENCOUNTER — Telehealth: Payer: Self-pay | Admitting: Pharmacist

## 2023-01-24 ENCOUNTER — Other Ambulatory Visit: Payer: Self-pay

## 2023-01-24 NOTE — Telephone Encounter (Signed)
Oral Oncology Patient Advocate Encounter   Submitted application for assistance for Dana [name] to [facility] Patient Assistance Program.   Application submitted via e-fax to [phone]   Eisai's phone number [phone].   I will continue to check the status until final determination.    Ardeen Fillers, CPhT Oncology Pharmacy Patient Advocate  [facility]  [phone] (phone) [phone] (fax) 01/24/2023 11:05 AM

## 2023-01-24 NOTE — Telephone Encounter (Signed)
Oral Oncology Patient Advocate Encounter  Reached out and spoke with patient regarding PAP paperwork, explained that I would send it to their preferred email via DocuSign.   Confirmed email address: [email or web address]  Patient expressed understanding and consent.  Will follow up once paperwork has been signed and returned.  Application also sent to MD Office for MD signatures. MD Office aware.    Ardeen Fillers, CPhT Oncology Pharmacy Patient Advocate  [facility]  [phone] (phone) [phone] (fax) 01/24/2023 9:38 AM

## 2023-01-24 NOTE — Telephone Encounter (Signed)
Oral Oncology Pharmacist Encounter  Received new prescription for Lenvima (lenvatinib) for the treatment of metastatic endometrial cancer  in conjunction with pembrolizumab, planned duration until disease progression or unacceptable drug toxicity. Planned start 01/30/23.  Patient will have a CMP checking when she returns to clinic on 01/30/23. No history of hepatic or renal impairment. Prescription dose and frequency assessed.   Current medication list in Epic reviewed, no DDIs with lenvatinib identified.  Evaluated chart and no patient barriers to medication adherence identified.   Prescription has been e-scribed to the [facility] for benefits analysis and approval.  Oral Oncology Clinic will continue to follow for insurance authorization, copayment issues, initial counseling and start date.  Patient agreed to treatment on 01/23/23 per MD documentation.  Remi Haggard, PharmD, BCPS, BCOP, CPP Hematology/Oncology Clinical Pharmacist Practitioner [place]/[facility]/AP Oral Chemotherapy Navigation Clinic [phone]  01/24/2023 8:09 AM

## 2023-01-24 NOTE — Telephone Encounter (Signed)
Received back patient signatures. I will submit once MD signatures are in hand. I will continue to follow and update until final determination.    Ardeen Fillers, CPhT Oncology Pharmacy Patient Advocate  [facility]  [phone] (phone) [phone] (fax) 01/24/2023 10:30 AM

## 2023-01-24 NOTE — Progress Notes (Signed)
Pharmacist Chemotherapy Monitoring - Initial Assessment    Anticipated start date: 01/30/23   The following has been reviewed per standard work regarding the patient's treatment regimen: The patient's diagnosis, treatment plan and drug doses, and organ/hematologic function Lab orders and baseline tests specific to treatment regimen  The treatment plan start date, drug sequencing, and pre-medications Prior authorization status  Patient's documented medication list, including drug-drug interaction screen and prescriptions for anti-emetics and supportive care specific to the treatment regimen The drug concentrations, fluid compatibility, administration routes, and timing of the medications to be used The patient's access for treatment and lifetime cumulative dose history, if applicable  The patient's medication allergies and previous infusion related reactions, if applicable   Changes made to treatment plan:  N/A  Follow up needed:  Pending authorization for treatment    Daylene Katayama, RPH, 01/24/2023  3:22 PM

## 2023-01-25 ENCOUNTER — Other Ambulatory Visit: Payer: Self-pay | Admitting: Oncology

## 2023-01-25 DIAGNOSIS — C55 Malignant neoplasm of uterus, part unspecified: Secondary | ICD-10-CM

## 2023-01-25 NOTE — Telephone Encounter (Signed)
Spoke to patient regarding approval for Patient Assistance through [employer] and drug education provided by pharmacist. Patient has already scheduled delivery of medication for Monday, 01/29/23 and was provided with instructions on reordering process of Lenvima. Patient knows to call me at [phone] with any questions or concerns regarding receiving medication from PAP.    Ardeen Fillers, CPhT Oncology Pharmacy Patient Advocate  [facility]  [phone] (phone) [phone] (fax) 01/25/2023 1:42 PM

## 2023-01-25 NOTE — Telephone Encounter (Signed)
Oral Oncology Patient Advocate Encounter   Received notification that the application for assistance for Lenvima through [facility] Patient Assistance Program has been approved.   Eisai's phone number [phone].   Effective dates: 01/25/23 through 07/17/23  Medication will be filled at [facility].   Ardeen Fillers, CPhT Oncology Pharmacy Patient Advocate  [facility]  [phone] (phone) [phone] (fax) 01/25/2023 12:26 PM

## 2023-01-25 NOTE — Telephone Encounter (Signed)
Received notification that Benefits Investigation was completed and application is continuing to process. I will continue to follow and update until final determination.    Ardeen Fillers, CPhT Oncology Pharmacy Patient Advocate  [facility]  [phone] (phone) [phone] (fax) 01/25/2023 12:23 PM

## 2023-01-25 NOTE — Telephone Encounter (Signed)
Oral Chemotherapy Pharmacist Encounter  Patient's Dana [name] will be delivered on 01/29/23, she knows to start on 01/30/23.  Patient Education I spoke with patient for overview of new oral chemotherapy medication: Lenvima (lenvatinib) for the treatment of metastatic endometrial cancer  in conjunction with pembrolizumab, planned duration until disease progression or unacceptable drug toxicity. Planned start 01/30/23.   Patient will take/receive: Lenvatinib: Take 2 capsules (20 mg total) by mouth daily Pembrolizumab: 200mg  given every 21 days   Side Effects: Side effects include but not limited to:    Lenvatinib: Hand-foot syndrome, diarrhea, N/V, fatigue, mouth sores, increased BP (HTN) Pembrolizumab: diarrhea, rash, changes in thyroid function   Diarrhea: discussed with patient that depending on which medication is causing the diarrhea, that determines the best management for the diarrhea. They should call the office if diarrhea occurs, they can start Lomotil, but still needs to call the office. Hand-foot syndrome: patient will order Udderly Smooth Extra Care 20, recommended they keep hands and feet moisturized  HTN: Currently well controlled, reviewed s/sx of HTN Mouth sores: pt instructed to call the office to obtain magic mouthwash if needed N/V: pt reported having antiemetic medication at home to use as needed. .    Reviewed with patient importance of keeping a medication schedule and plan for any missed doses.  After discussion with patient no patient barriers to medication adherence identified.   Ms. Rotter voiced understanding and appreciation. All questions answered. Medication handout provided.  Provided patient with Oral Chemotherapy Navigation Clinic phone number. Patient knows to call the office with questions or concerns. Oral Chemotherapy Navigation Clinic will continue to follow.  Remi Haggard, PharmD, BCPS, BCOP, CPP Hematology/Oncology Clinical Pharmacist  Practitioner [place]/[facility]/AP Oral Chemotherapy Navigation Clinic [phone]  01/25/2023 1:34 PM

## 2023-01-29 ENCOUNTER — Telehealth: Payer: Self-pay

## 2023-01-29 ENCOUNTER — Other Ambulatory Visit: Payer: Self-pay

## 2023-01-29 NOTE — Telephone Encounter (Signed)
A patient contacted our office to report feeling unwell, suspecting food poisoning from Saturday. She is currently experiencing painful cramps and diarrhea after eating, but has no fever. The want  patient to refrain from treatment at this time and reschedule for next week. We recommended that the patient call us back in the next day or so if her symptoms do not improve.

## 2023-01-30 ENCOUNTER — Inpatient Hospital Stay: Payer: Medicare HMO

## 2023-02-02 ENCOUNTER — Ambulatory Visit: Payer: Medicare HMO | Admitting: Oncology

## 2023-02-05 ENCOUNTER — Inpatient Hospital Stay: Payer: Medicare HMO

## 2023-02-05 VITALS — BP 136/80 | HR 61 | Temp 98.2°F | Resp 18 | Ht 62.0 in | Wt 168.4 lb

## 2023-02-05 DIAGNOSIS — C55 Malignant neoplasm of uterus, part unspecified: Secondary | ICD-10-CM

## 2023-02-05 DIAGNOSIS — C541 Malignant neoplasm of endometrium: Secondary | ICD-10-CM | POA: Diagnosis not present

## 2023-02-05 DIAGNOSIS — Z7189 Other specified counseling: Secondary | ICD-10-CM

## 2023-02-05 DIAGNOSIS — C7989 Secondary malignant neoplasm of other specified sites: Secondary | ICD-10-CM | POA: Diagnosis not present

## 2023-02-05 DIAGNOSIS — Z79620 Long term (current) use of immunosuppressive biologic: Secondary | ICD-10-CM | POA: Diagnosis not present

## 2023-02-05 DIAGNOSIS — C774 Secondary and unspecified malignant neoplasm of inguinal and lower limb lymph nodes: Secondary | ICD-10-CM | POA: Diagnosis not present

## 2023-02-05 DIAGNOSIS — Z5112 Encounter for antineoplastic immunotherapy: Secondary | ICD-10-CM | POA: Diagnosis not present

## 2023-02-05 LAB — CMP (CANCER CENTER ONLY)
ALT: 10 U/L (ref 0–44)
AST: 15 U/L (ref 15–41)
Albumin: 3.9 g/dL (ref 3.5–5.0)
Alkaline Phosphatase: 74 U/L (ref 38–126)
Anion gap: 9 (ref 5–15)
BUN: 11 mg/dL (ref 8–23)
CO2: 25 mmol/L (ref 22–32)
Calcium: 8.9 mg/dL (ref 8.9–10.3)
Chloride: 104 mmol/L (ref 98–111)
Creatinine: 0.74 mg/dL (ref 0.44–1.00)
GFR, Estimated: >60 mL/min (ref >60)
Glucose, Bld: 93 mg/dL (ref 70–99)
Potassium: 3.7 mmol/L (ref 3.5–5.1)
Sodium: 138 mmol/L (ref 135–145)
Total Bilirubin: 0.4 mg/dL (ref 0.3–1.2)
Total Protein: 6.4 g/dL — ABNORMAL LOW (ref 6.5–8.1)

## 2023-02-05 LAB — CBC WITH DIFFERENTIAL (CANCER CENTER ONLY)
Abs Immature Granulocytes: 0.02 10*3/uL (ref 0.00–0.07)
Basophils Absolute: 0 10*3/uL (ref 0.0–0.1)
Basophils Relative: 0 %
Eosinophils Absolute: 0.1 10*3/uL (ref 0.0–0.5)
Eosinophils Relative: 1 %
HCT: 39.5 % (ref 36.0–46.0)
Hemoglobin: 13.3 g/dL (ref 12.0–15.0)
Immature Granulocytes: 0 %
Lymphocytes Relative: 30 %
Lymphs Abs: 1.5 10*3/uL (ref 0.7–4.0)
MCH: 30.5 pg (ref 26.0–34.0)
MCHC: 33.7 g/dL (ref 30.0–36.0)
MCV: 90.6 fL (ref 80.0–100.0)
Monocytes Absolute: 0.5 10*3/uL (ref 0.1–1.0)
Monocytes Relative: 10 %
Neutro Abs: 2.9 10*3/uL (ref 1.7–7.7)
Neutrophils Relative %: 59 %
Platelet Count: 258 10*3/uL (ref 150–400)
RBC: 4.36 MIL/uL (ref 3.87–5.11)
RDW: 13.2 % (ref 11.5–15.5)
WBC Count: 5.1 10*3/uL (ref 4.0–10.5)
nRBC: 0 % (ref 0.0–0.2)

## 2023-02-05 LAB — TSH: TSH: 4.412 u[IU]/mL (ref 0.350–4.500)

## 2023-02-05 MED ORDER — SODIUM CHLORIDE 0.9 % IV SOLN: Freq: Once | INTRAVENOUS | Status: AC

## 2023-02-05 MED ORDER — SODIUM CHLORIDE 0.9 % IV SOLN
200.0000 mg | Freq: Once | INTRAVENOUS | Status: AC
  Administered 2023-02-05: 200 mg via INTRAVENOUS
  Filled 2023-02-05: qty 8

## 2023-02-05 NOTE — Patient Instructions (Signed)
[facility]   Discharge Instructions: Thank you for choosing [facility] to provide your oncology and hematology care.   If you have a lab appointment with the Cancer Center, please go directly to the Cancer Center and check in at the registration area.   Wear comfortable clothing and clothing appropriate for easy access to any Portacath or PICC line.   We strive to give you quality time with your provider. You may need to reschedule your appointment if you arrive late (15 or more minutes).  Arriving late affects you and other patients whose appointments are after yours.  Also, if you miss three or more appointments without notifying the office, you may be dismissed from the clinic at the provider's discretion.      For prescription refill requests, have your pharmacy contact our office and allow 72 hours for refills to be completed.    Today you received the following chemotherapy and/or immunotherapy agents Keytruda       To help prevent nausea and vomiting after your treatment, we encourage you to take your nausea medication as directed.  BELOW ARE SYMPTOMS THAT SHOULD BE REPORTED IMMEDIATELY: *FEVER GREATER THAN 100.4 F (38 C) OR HIGHER *CHILLS OR SWEATING *NAUSEA AND VOMITING THAT IS NOT CONTROLLED WITH YOUR NAUSEA MEDICATION *UNUSUAL SHORTNESS OF BREATH *UNUSUAL BRUISING OR BLEEDING *URINARY PROBLEMS (pain or burning when urinating, or frequent urination) *BOWEL PROBLEMS (unusual diarrhea, constipation, pain near the anus) TENDERNESS IN MOUTH AND THROAT WITH OR WITHOUT PRESENCE OF ULCERS (sore throat, sores in mouth, or a toothache) UNUSUAL RASH, SWELLING OR PAIN  UNUSUAL VAGINAL DISCHARGE OR ITCHING   Items with * indicate a potential emergency and should be followed up as soon as possible or go to the Emergency Department if any problems should occur.  Please show the CHEMOTHERAPY ALERT CARD or IMMUNOTHERAPY ALERT CARD at  check-in to the Emergency Department and triage nurse.  Should you have questions after your visit or need to cancel or reschedule your appointment, please contact [facility]  Dept: [phone]  and follow the prompts.  Office hours are 8:00 a.m. to 4:30 p.m. Monday - Friday. Please note that voicemails left after 4:00 p.m. may not be returned until the following business day.  We are closed weekends and major holidays. You have access to a nurse at all times for urgent questions. Please call the main number to the clinic Dept: [phone] and follow the prompts.   For any non-urgent questions, you may also contact your provider using MyChart. We now offer e-Visits for anyone 18 and older to request care online for non-urgent symptoms. For details visit [email or web address].   Also download the MyChart app! Go to the app store, search "MyChart", open the app, select [facility], and log in with your MyChart username and password.  Pembrolizumab Injection What is this medication? PEMBROLIZUMAB (PEM broe LIZ ue mab) treats some types of cancer. It works by helping your immune system slow or stop the spread of cancer cells. It is a monoclonal antibody. This medicine may be used for other purposes; ask your health care provider or pharmacist if you have questions. COMMON BRAND NAME(S): Keytruda What should I tell my care team before I take this medication? They need to know if you have any of these conditions: Allogeneic stem cell transplant (uses someone else's stem cells) Autoimmune diseases, such as Crohn disease, ulcerative colitis, lupus History of chest radiation Nervous   system problems, such as Guillain-Barre syndrome, myasthenia gravis Organ transplant An unusual or allergic reaction to pembrolizumab, other medications, foods, dyes, or preservatives Pregnant or trying to get pregnant Breast-feeding How should I use this medication? This medication  is injected into a vein. It is given by your care team in a hospital or clinic setting. A special MedGuide will be given to you before each treatment. Be sure to read this information carefully each time. Talk to your care team about the use of this medication in children. While it may be prescribed for children as young as 6 months for selected conditions, precautions do apply. Overdosage: If you think you have taken too much of this medicine contact a poison control center or emergency room at once. NOTE: This medicine is only for you. Do not share this medicine with others. What if I miss a dose? Keep appointments for follow-up doses. It is important not to miss your dose. Call your care team if you are unable to keep an appointment. What may interact with this medication? Interactions have not been studied. This list may not describe all possible interactions. Give your health care provider a list of all the medicines, herbs, non-prescription drugs, or dietary supplements you use. Also tell them if you smoke, drink alcohol, or use illegal drugs. Some items may interact with your medicine. What should I watch for while using this medication? Your condition will be monitored carefully while you are receiving this medication. You may need blood work while taking this medication. This medication may cause serious skin reactions. They can happen weeks to months after starting the medication. Contact your care team right away if you notice fevers or flu-like symptoms with a rash. The rash may be red or purple and then turn into blisters or peeling of the skin. You may also notice a red rash with swelling of the face, lips, or lymph nodes in your neck or under your arms. Tell your care team right away if you have any change in your eyesight. Talk to your care team if you may be pregnant. Serious birth defects can occur if you take this medication during pregnancy and for 4 months after the last dose. You  will need a negative pregnancy test before starting this medication. Contraception is recommended while taking this medication and for 4 months after the last dose. Your care team can help you find the option that works for you. Do not breastfeed while taking this medication and for 4 months after the last dose. What side effects may I notice from receiving this medication? Side effects that you should report to your care team as soon as possible: Allergic reactions--skin rash, itching, hives, swelling of the face, lips, tongue, or throat Dry cough, shortness of breath or trouble breathing Eye pain, redness, irritation, or discharge with blurry or decreased vision Heart muscle inflammation--unusual weakness or fatigue, shortness of breath, chest pain, fast or irregular heartbeat, dizziness, swelling of the ankles, feet, or hands Hormone gland problems--headache, sensitivity to light, unusual weakness or fatigue, dizziness, fast or irregular heartbeat, increased sensitivity to cold or heat, excessive sweating, constipation, hair loss, increased thirst or amount of urine, tremors or shaking, irritability Infusion reactions--chest pain, shortness of breath or trouble breathing, feeling faint or lightheaded Kidney injury (glomerulonephritis)--decrease in the amount of urine, red or dark brown urine, foamy or bubbly urine, swelling of the ankles, hands, or feet Liver injury--right upper belly pain, loss of appetite, nausea, light-colored stool, dark yellow or   brown urine, yellowing skin or eyes, unusual weakness or fatigue Pain, tingling, or numbness in the hands or feet, muscle weakness, change in vision, confusion or trouble speaking, loss of balance or coordination, trouble walking, seizures Rash, fever, and swollen lymph nodes Redness, blistering, peeling, or loosening of the skin, including inside the mouth Sudden or severe stomach pain, bloody diarrhea, fever, nausea, vomiting Side effects that  usually do not require medical attention (report to your care team if they continue or are bothersome): Bone, joint, or muscle pain Diarrhea Fatigue Loss of appetite Nausea Skin rash This list may not describe all possible side effects. Call your doctor for medical advice about side effects. You may report side effects to FDA at 1-800-FDA-1088. Where should I keep my medication? This medication is given in a hospital or clinic. It will not be stored at home. NOTE: This sheet is a summary. It may not cover all possible information. If you have questions about this medicine, talk to your doctor, pharmacist, or health care provider.  2024 Elsevier/Gold Standard (2021-11-15 00:00:00)   

## 2023-02-06 ENCOUNTER — Encounter: Payer: Self-pay | Admitting: Oncology

## 2023-02-06 LAB — T4: T4, Total: 6.4 ug/dL (ref 4.5–12.0)

## 2023-02-06 NOTE — Telephone Encounter (Signed)
24 Hour Callback 24 Hour callback post 1st time Keytruda. Spoke with patient-reports no side effects. Denies N/V/D, eating and drinking without difficulty. Does have some fatigue. Pt reminded to call the office with any questions or concerns.

## 2023-02-11 ENCOUNTER — Other Ambulatory Visit: Payer: Self-pay

## 2023-02-12 ENCOUNTER — Inpatient Hospital Stay: Payer: Medicare HMO | Admitting: Oncology

## 2023-02-16 ENCOUNTER — Telehealth: Payer: Self-pay | Admitting: *Deleted

## 2023-02-16 ENCOUNTER — Other Ambulatory Visit: Payer: Self-pay | Admitting: *Deleted

## 2023-02-16 NOTE — Telephone Encounter (Signed)
Called to request her lab/OV/Keytruda of 8/6 be moved to following week since tx is every 3 weeks. Appointments on 8/6 were cancelled and scheduling message sent to reschedule for following week.  She also wants MD aware that she is checking her BP daily and when her diastolic is high, she is only taking 10 mg that day of lenvatinib and resumes 20 mg daily if reading is better.  She is not on any antihypertensive medication. Wants to be sure MD is OK with her dose adjustments.

## 2023-02-16 NOTE — Telephone Encounter (Signed)
Informed patient that Dr. Truett Perna does not want her adjusting her dose. Continue 20 mg daily and call if diastolic BP is consistently > 161. Will start her on antihypertensive at that point.

## 2023-02-20 ENCOUNTER — Inpatient Hospital Stay: Payer: Medicare HMO

## 2023-02-20 ENCOUNTER — Inpatient Hospital Stay: Payer: Medicare HMO | Admitting: Nurse Practitioner

## 2023-02-21 ENCOUNTER — Other Ambulatory Visit: Payer: Self-pay

## 2023-02-27 ENCOUNTER — Inpatient Hospital Stay: Payer: Medicare HMO | Attending: Hematology and Oncology

## 2023-02-27 ENCOUNTER — Inpatient Hospital Stay: Payer: Medicare HMO | Admitting: Nurse Practitioner

## 2023-02-27 ENCOUNTER — Encounter: Payer: Self-pay | Admitting: Nurse Practitioner

## 2023-02-27 ENCOUNTER — Inpatient Hospital Stay: Payer: Medicare HMO

## 2023-02-27 VITALS — BP 178/100 | HR 62

## 2023-02-27 VITALS — BP 160/100 | HR 89 | Temp 98.2°F | Resp 18 | Ht 62.0 in | Wt 164.2 lb

## 2023-02-27 DIAGNOSIS — C55 Malignant neoplasm of uterus, part unspecified: Secondary | ICD-10-CM

## 2023-02-27 DIAGNOSIS — C541 Malignant neoplasm of endometrium: Secondary | ICD-10-CM | POA: Insufficient documentation

## 2023-02-27 DIAGNOSIS — Z7189 Other specified counseling: Secondary | ICD-10-CM

## 2023-02-27 DIAGNOSIS — Z5112 Encounter for antineoplastic immunotherapy: Secondary | ICD-10-CM | POA: Diagnosis not present

## 2023-02-27 DIAGNOSIS — Z79620 Long term (current) use of immunosuppressive biologic: Secondary | ICD-10-CM | POA: Insufficient documentation

## 2023-02-27 DIAGNOSIS — C7989 Secondary malignant neoplasm of other specified sites: Secondary | ICD-10-CM | POA: Insufficient documentation

## 2023-02-27 LAB — CBC WITH DIFFERENTIAL (CANCER CENTER ONLY)
Abs Immature Granulocytes: 0 10*3/uL (ref 0.00–0.07)
Basophils Absolute: 0 10*3/uL (ref 0.0–0.1)
Basophils Relative: 1 %
Eosinophils Absolute: 0.1 10*3/uL (ref 0.0–0.5)
Eosinophils Relative: 3 %
HCT: 44.2 % (ref 36.0–46.0)
Hemoglobin: 15.5 g/dL — ABNORMAL HIGH (ref 12.0–15.0)
Immature Granulocytes: 0 %
Lymphocytes Relative: 31 %
Lymphs Abs: 1.1 10*3/uL (ref 0.7–4.0)
MCH: 30.4 pg (ref 26.0–34.0)
MCHC: 35.1 g/dL (ref 30.0–36.0)
MCV: 86.7 fL (ref 80.0–100.0)
Monocytes Absolute: 0.3 10*3/uL (ref 0.1–1.0)
Monocytes Relative: 9 %
Neutro Abs: 2.1 10*3/uL (ref 1.7–7.7)
Neutrophils Relative %: 56 %
Platelet Count: 222 10*3/uL (ref 150–400)
RBC: 5.1 MIL/uL (ref 3.87–5.11)
RDW: 13.2 % (ref 11.5–15.5)
WBC Count: 3.7 10*3/uL — ABNORMAL LOW (ref 4.0–10.5)
nRBC: 0 % (ref 0.0–0.2)

## 2023-02-27 LAB — CMP (CANCER CENTER ONLY)
ALT: 8 U/L (ref 0–44)
AST: 14 U/L — ABNORMAL LOW (ref 15–41)
Albumin: 3.9 g/dL (ref 3.5–5.0)
Alkaline Phosphatase: 86 U/L (ref 38–126)
Anion gap: 9 (ref 5–15)
BUN: 7 mg/dL — ABNORMAL LOW (ref 8–23)
CO2: 24 mmol/L (ref 22–32)
Calcium: 8.9 mg/dL (ref 8.9–10.3)
Chloride: 100 mmol/L (ref 98–111)
Creatinine: 0.88 mg/dL (ref 0.44–1.00)
GFR, Estimated: >60 mL/min (ref >60)
Glucose, Bld: 116 mg/dL — ABNORMAL HIGH (ref 70–99)
Potassium: 3.8 mmol/L (ref 3.5–5.1)
Sodium: 133 mmol/L — ABNORMAL LOW (ref 135–145)
Total Bilirubin: 0.6 mg/dL (ref 0.3–1.2)
Total Protein: 7 g/dL (ref 6.5–8.1)

## 2023-02-27 MED ORDER — SODIUM CHLORIDE 0.9 % IV SOLN
200.0000 mg | Freq: Once | INTRAVENOUS | Status: AC
  Administered 2023-02-27: 200 mg via INTRAVENOUS
  Filled 2023-02-27: qty 8

## 2023-02-27 MED ORDER — SODIUM CHLORIDE 0.9 % IV SOLN: Freq: Once | INTRAVENOUS | Status: AC

## 2023-02-27 NOTE — Progress Notes (Unsigned)
[facility] OFFICE PROGRESS NOTE   Diagnosis: Endometrial cancer  INTERVAL HISTORY:   Dana [name] returns as scheduled.  She completed cycle 1 Pembrolizumab 02/05/2023.  She continues lenvatinib.  Her mouth feels sore.  Burning over the feet.  Blood pressure has been elevated.  She decreased the lenvatinib to 10 mg when the blood pressure was elevated and she noted improvement.  She subsequently resumed the 20 mg daily dose.  No change in baseline bowel habits.  No nausea or vomiting.  Appetite is decreased.  Rash in the groin.  Intermittent pain at the right lower abdomen, right thigh region.  Objective:  Vital signs in last 24 hours:  Blood pressure (!) 160/100, pulse 89, temperature 98.2 F (36.8 C), temperature source Oral, resp. rate 18, height 5\' 2"  (1.575 m), weight 164 lb 3.2 oz (74.5 kg), SpO2 98%.    HEENT: Scattered areas of erythema over the tongue and bilateral buccal regions. Lymphatics: Right inguinal nodal mass is smaller. Resp: Lungs clear bilaterally. Cardio: Regular rate and rhythm. GI: No hepatosplenomegaly.  No mass. Vascular: Edema throughout the left leg. Neuro: Alert and oriented. Skin: Healed surgical incision at the right thigh with faint surrounding erythema, tender.  Palms without erythema.  Soles are dry appearing, erythematous.   Lab Results:  Lab Results  Component Value Date   WBC 3.7 (L) 02/27/2023   HGB 15.5 (H) 02/27/2023   HCT 44.2 02/27/2023   MCV 86.7 02/27/2023   PLT 222 02/27/2023   NEUTROABS 2.1 02/27/2023    Imaging:  No results found.  Medications: I have reviewed the patient's current medications.  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, status post a TAH, BSO, appendectomy, pelvic and periaortic lymphadenectomy, and wedge resection of a liver lesion Gross complete resection, tumor involve both ovaries, lymph nodes negative 6 cycles of adjuvant  Taxol/carboplatin CT abdomen/pelvis 03/28/2019-enlarged right inguinal and iliac nodes 04/10/2019-right inguinal lymph node biopsy-metastatic adenocarcinoma, ER 90%, PR 95%, PD-L1 combined positive score-1, MSS, tumor mutation burden-4 Cisplatin 05/16/2019 20-1 cycle Radiation to the lateral pelvis and 1 lymph nodes November 2020 CT 12/72/020-decrease size of right inguinal and external iliac lymph nodes, no evidence of disease progression Everolimus and letrozole 07/14/2019 - 04/06/2020 CT 10/20/2019-resolution of right iliac lymph node, decrease in size of right inguinal node CT 03/08/2020-no evidence of recurrent or metastatic disease in the abdomen or pelvis CT 05/31/2020-no evidence of recurrent or metastatic disease CT 03/11/2021-increase right inguinal lymph node, enlargement of bilateral inguinal lymph nodes, subcutaneous soft tissue nodule at the anterior right thigh Carboplatin 04/06/2021, 04/25/2021, 05/16/2021 CT 07/12/2021-mild decrease in size of bilateral inguinal lymphadenopathy, no progressive disease in the abdomen or pelvis, 0.6 cm left inguinal node was 0.8 cm previously Letrozole 08/06/2021 CT abdomen/pelvis 04/10/2022-stable right inguinal lymph node, no new site of metastatic disease CT right femur  04/10/2022 -2.2 x 1 cm anterior subcutaneous mass Letrozole continued CT right femur 07/19/2022-mild enlargement of right thigh subcutaneous mass stable right inguinal nodal mass CTs 07/19/2022-stable right inguinal lymph node, no new or progressive disease 11/20/2022-right thigh mass larger and tender, letrozole discontinued 11/28/2022-significant enlargement of anterior right thigh soft tissue mass, slightly larger right inguinal mass 12/25/2022-wide local excision of right thigh mass, metastatic endometrial adenocarcinoma, ER positive, PR positive, negative resection margins Cycle 1 pembrolizumab/lenvatinib 02/05/2023 Cycle 2 Pembrolizumab 02/27/2023, lenvatinib placed on hold due to mucositis  and hypertension   2.  Left lower extremity lymphedema, improved after therapy in the lymphedema clinic  3.  Chronic diarrhea controlled with Lomotil    Disposition: [name] appears stable.  She has completed 1 cycle of Pembrolizumab.  She is on lenvatinib 20 mg daily.  Plan to proceed with Pembrolizumab today as scheduled.  We are placing lenvatinib on hold due to mucositis and hypertension.  We discussed beginning amlodipine.  She prefers to monitor her blood pressure over the next several days to see if there is improvement.  She will contact the office if the blood pressure remains elevated and agrees to begin amlodipine for persistent hypertension.  CBC and chemistry panel reviewed.  Labs adequate to proceed as above.  She will return for follow-up in 1 week with the plan to resume lenvatinib at a reduced dose if the blood pressure is under good control and the mucositis has resolved.  Patient seen with Dr. Truett Perna.  Lonna Cobb ANP/GNP-BC   02/27/2023  9:40 AM This was a shared visit with Lonna Cobb.  [name] is interviewed and examined.  She has hypertension and oral mucositis secondary to lenvatinib.  Lenvatinib will be placed on hold.  We will plan to resume lenvatinib with a dose reduction if the blood pressure and mucositis are improved next week.  She will continue every 3-week pembrolizumab.  The right inguinal mass appears smaller.  I was present for greater than 50% of today's visit.  I performed medical decision making.  Mancel Bale, MD

## 2023-02-27 NOTE — Progress Notes (Signed)
Patient seen by Lonna Cobb NP today  Vitals are not all within treatment parameters. Blood pressure is elevated no intervention   Labs reviewed by Lonna Cobb NP and are within treatment parameters.  Per physician team, patient is ready for treatment and there are NO modifications to the treatment plan.

## 2023-02-27 NOTE — Patient Instructions (Signed)
[facility]   Discharge Instructions: Thank you for choosing [facility] to provide your oncology and hematology care.   If you have a lab appointment with the Cancer Center, please go directly to the Cancer Center and check in at the registration area.   Wear comfortable clothing and clothing appropriate for easy access to any Portacath or PICC line.   We strive to give you quality time with your provider. You may need to reschedule your appointment if you arrive late (15 or more minutes).  Arriving late affects you and other patients whose appointments are after yours.  Also, if you miss three or more appointments without notifying the office, you may be dismissed from the clinic at the provider's discretion.      For prescription refill requests, have your pharmacy contact our office and allow 72 hours for refills to be completed.    Today you received the following chemotherapy and/or immunotherapy agents Keytruda       To help prevent nausea and vomiting after your treatment, we encourage you to take your nausea medication as directed.  BELOW ARE SYMPTOMS THAT SHOULD BE REPORTED IMMEDIATELY: *FEVER GREATER THAN 100.4 F (38 C) OR HIGHER *CHILLS OR SWEATING *NAUSEA AND VOMITING THAT IS NOT CONTROLLED WITH YOUR NAUSEA MEDICATION *UNUSUAL SHORTNESS OF BREATH *UNUSUAL BRUISING OR BLEEDING *URINARY PROBLEMS (pain or burning when urinating, or frequent urination) *BOWEL PROBLEMS (unusual diarrhea, constipation, pain near the anus) TENDERNESS IN MOUTH AND THROAT WITH OR WITHOUT PRESENCE OF ULCERS (sore throat, sores in mouth, or a toothache) UNUSUAL RASH, SWELLING OR PAIN  UNUSUAL VAGINAL DISCHARGE OR ITCHING   Items with * indicate a potential emergency and should be followed up as soon as possible or go to the Emergency Department if any problems should occur.  Please show the CHEMOTHERAPY ALERT CARD or IMMUNOTHERAPY ALERT CARD at  check-in to the Emergency Department and triage nurse.  Should you have questions after your visit or need to cancel or reschedule your appointment, please contact [facility]  Dept: [phone]  and follow the prompts.  Office hours are 8:00 a.m. to 4:30 p.m. Monday - Friday. Please note that voicemails left after 4:00 p.m. may not be returned until the following business day.  We are closed weekends and major holidays. You have access to a nurse at all times for urgent questions. Please call the main number to the clinic Dept: [phone] and follow the prompts.   For any non-urgent questions, you may also contact your provider using MyChart. We now offer e-Visits for anyone 18 and older to request care online for non-urgent symptoms. For details visit [email or web address].   Also download the MyChart app! Go to the app store, search "MyChart", open the app, select [facility], and log in with your MyChart username and password.  Pembrolizumab Injection What is this medication? PEMBROLIZUMAB (PEM broe LIZ ue mab) treats some types of cancer. It works by helping your immune system slow or stop the spread of cancer cells. It is a monoclonal antibody. This medicine may be used for other purposes; ask your health care provider or pharmacist if you have questions. COMMON BRAND NAME(S): Keytruda What should I tell my care team before I take this medication? They need to know if you have any of these conditions: Allogeneic stem cell transplant (uses someone else's stem cells) Autoimmune diseases, such as Crohn disease, ulcerative colitis, lupus History of chest radiation Nervous   system problems, such as Guillain-Barre syndrome, myasthenia gravis Organ transplant An unusual or allergic reaction to pembrolizumab, other medications, foods, dyes, or preservatives Pregnant or trying to get pregnant Breast-feeding How should I use this medication? This medication  is injected into a vein. It is given by your care team in a hospital or clinic setting. A special MedGuide will be given to you before each treatment. Be sure to read this information carefully each time. Talk to your care team about the use of this medication in children. While it may be prescribed for children as young as 6 months for selected conditions, precautions do apply. Overdosage: If you think you have taken too much of this medicine contact a poison control center or emergency room at once. NOTE: This medicine is only for you. Do not share this medicine with others. What if I miss a dose? Keep appointments for follow-up doses. It is important not to miss your dose. Call your care team if you are unable to keep an appointment. What may interact with this medication? Interactions have not been studied. This list may not describe all possible interactions. Give your health care provider a list of all the medicines, herbs, non-prescription drugs, or dietary supplements you use. Also tell them if you smoke, drink alcohol, or use illegal drugs. Some items may interact with your medicine. What should I watch for while using this medication? Your condition will be monitored carefully while you are receiving this medication. You may need blood work while taking this medication. This medication may cause serious skin reactions. They can happen weeks to months after starting the medication. Contact your care team right away if you notice fevers or flu-like symptoms with a rash. The rash may be red or purple and then turn into blisters or peeling of the skin. You may also notice a red rash with swelling of the face, lips, or lymph nodes in your neck or under your arms. Tell your care team right away if you have any change in your eyesight. Talk to your care team if you may be pregnant. Serious birth defects can occur if you take this medication during pregnancy and for 4 months after the last dose. You  will need a negative pregnancy test before starting this medication. Contraception is recommended while taking this medication and for 4 months after the last dose. Your care team can help you find the option that works for you. Do not breastfeed while taking this medication and for 4 months after the last dose. What side effects may I notice from receiving this medication? Side effects that you should report to your care team as soon as possible: Allergic reactions--skin rash, itching, hives, swelling of the face, lips, tongue, or throat Dry cough, shortness of breath or trouble breathing Eye pain, redness, irritation, or discharge with blurry or decreased vision Heart muscle inflammation--unusual weakness or fatigue, shortness of breath, chest pain, fast or irregular heartbeat, dizziness, swelling of the ankles, feet, or hands Hormone gland problems--headache, sensitivity to light, unusual weakness or fatigue, dizziness, fast or irregular heartbeat, increased sensitivity to cold or heat, excessive sweating, constipation, hair loss, increased thirst or amount of urine, tremors or shaking, irritability Infusion reactions--chest pain, shortness of breath or trouble breathing, feeling faint or lightheaded Kidney injury (glomerulonephritis)--decrease in the amount of urine, red or dark brown urine, foamy or bubbly urine, swelling of the ankles, hands, or feet Liver injury--right upper belly pain, loss of appetite, nausea, light-colored stool, dark yellow or   brown urine, yellowing skin or eyes, unusual weakness or fatigue Pain, tingling, or numbness in the hands or feet, muscle weakness, change in vision, confusion or trouble speaking, loss of balance or coordination, trouble walking, seizures Rash, fever, and swollen lymph nodes Redness, blistering, peeling, or loosening of the skin, including inside the mouth Sudden or severe stomach pain, bloody diarrhea, fever, nausea, vomiting Side effects that  usually do not require medical attention (report to your care team if they continue or are bothersome): Bone, joint, or muscle pain Diarrhea Fatigue Loss of appetite Nausea Skin rash This list may not describe all possible side effects. Call your doctor for medical advice about side effects. You may report side effects to FDA at 1-800-FDA-1088. Where should I keep my medication? This medication is given in a hospital or clinic. It will not be stored at home. NOTE: This sheet is a summary. It may not cover all possible information. If you have questions about this medicine, talk to your doctor, pharmacist, or health care provider.  2024 Elsevier/Gold Standard (2021-11-15 00:00:00)   

## 2023-02-28 ENCOUNTER — Encounter: Payer: Self-pay | Admitting: Oncology

## 2023-03-01 ENCOUNTER — Other Ambulatory Visit: Payer: Self-pay

## 2023-03-06 ENCOUNTER — Encounter: Payer: Self-pay | Admitting: Nurse Practitioner

## 2023-03-06 ENCOUNTER — Inpatient Hospital Stay: Payer: Medicare HMO | Admitting: Nurse Practitioner

## 2023-03-06 ENCOUNTER — Inpatient Hospital Stay: Payer: Medicare HMO | Admitting: Nutrition

## 2023-03-06 VITALS — BP 144/86 | HR 76 | Temp 98.1°F | Resp 18 | Ht 62.0 in | Wt 167.1 lb

## 2023-03-06 DIAGNOSIS — C7989 Secondary malignant neoplasm of other specified sites: Secondary | ICD-10-CM | POA: Diagnosis not present

## 2023-03-06 DIAGNOSIS — C541 Malignant neoplasm of endometrium: Secondary | ICD-10-CM | POA: Diagnosis not present

## 2023-03-06 DIAGNOSIS — C55 Malignant neoplasm of uterus, part unspecified: Secondary | ICD-10-CM | POA: Diagnosis not present

## 2023-03-06 DIAGNOSIS — Z5112 Encounter for antineoplastic immunotherapy: Secondary | ICD-10-CM | POA: Diagnosis not present

## 2023-03-06 DIAGNOSIS — Z79620 Long term (current) use of immunosuppressive biologic: Secondary | ICD-10-CM | POA: Diagnosis not present

## 2023-03-06 NOTE — Progress Notes (Unsigned)
[facility] OFFICE PROGRESS NOTE   Diagnosis: Endometrial cancer  INTERVAL HISTORY:   Dana [name] returns as scheduled.  She completed cycle 2 Pembrolizumab 02/27/2023.  Lenvatinib was placed on hold same day due to mucositis and hypertension.  Mouth soreness is better though the end of her tongue feels abnormal.  Feet are now "sore and burning".  Fingers sore as well.  She began having diarrhea 4 to 5 days ago, estimates 3-4 watery bowel movements a day.  She is taking Lomotil.  No blood with bowel movements.  She intermittently feels queasy.  She reports the right groin nodal mass is now draining.  Right thigh surgical incision is less painful.  She reports blood pressure increased to 187/114 over the weekend.  She began amlodipine and noted improvement.  She took amlodipine daily for 2 days and then discontinued.  She noted a headache after taking amlodipine.  Objective:  Vital signs in last 24 hours:  Blood pressure (!) 144/86, pulse 76, temperature 98.1 F (36.7 C), temperature source Temporal, resp. rate 18, height 5\' 2"  (1.575 m), weight 167 lb 1.6 oz (75.8 kg), SpO2 98%.    HEENT: Small ulceration left anterior tongue. Lymphatics: Firm mass right inguinal region with serous drainage. Resp: Lungs clear bilaterally. Cardio: Regular rate and rhythm. GI: No hepatosplenomegaly. Vascular: Edema throughout the left leg. Skin: Palms and soles are erythematous, dry.   Lab Results:  Lab Results  Component Value Date   WBC 3.7 (L) 02/27/2023   HGB 15.5 (H) 02/27/2023   HCT 44.2 02/27/2023   MCV 86.7 02/27/2023   PLT 222 02/27/2023   NEUTROABS 2.1 02/27/2023    Imaging:  No results found.  Medications: I have reviewed the patient's current medications.  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, status post a TAH, BSO, appendectomy, pelvic and periaortic lymphadenectomy, and wedge resection of a liver  lesion Gross complete resection, tumor involve both ovaries, lymph nodes negative 6 cycles of adjuvant Taxol/carboplatin CT abdomen/pelvis 03/28/2019-enlarged right inguinal and iliac nodes 04/10/2019-right inguinal lymph node biopsy-metastatic adenocarcinoma, ER 90%, PR 95%, PD-L1 combined positive score-1, MSS, tumor mutation burden-4 Cisplatin 05/16/2019 20-1 cycle Radiation to the lateral pelvis and 1 lymph nodes November 2020 CT 12/72/020-decrease size of right inguinal and external iliac lymph nodes, no evidence of disease progression Everolimus and letrozole 07/14/2019 - 04/06/2020 CT 10/20/2019-resolution of right iliac lymph node, decrease in size of right inguinal node CT 03/08/2020-no evidence of recurrent or metastatic disease in the abdomen or pelvis CT 05/31/2020-no evidence of recurrent or metastatic disease CT 03/11/2021-increase right inguinal lymph node, enlargement of bilateral inguinal lymph nodes, subcutaneous soft tissue nodule at the anterior right thigh Carboplatin 04/06/2021, 04/25/2021, 05/16/2021 CT 07/12/2021-mild decrease in size of bilateral inguinal lymphadenopathy, no progressive disease in the abdomen or pelvis, 0.6 cm left inguinal node was 0.8 cm previously Letrozole 08/06/2021 CT abdomen/pelvis 04/10/2022-stable right inguinal lymph node, no new site of metastatic disease CT right femur  04/10/2022 -2.2 x 1 cm anterior subcutaneous mass Letrozole continued CT right femur 07/19/2022-mild enlargement of right thigh subcutaneous mass stable right inguinal nodal mass CTs 07/19/2022-stable right inguinal lymph node, no new or progressive disease 11/20/2022-right thigh mass larger and tender, letrozole discontinued 11/28/2022-significant enlargement of anterior right thigh soft tissue mass, slightly larger right inguinal mass 12/25/2022-wide local excision of right thigh mass, metastatic endometrial adenocarcinoma, ER positive, PR positive, negative resection margins Cycle 1  pembrolizumab/lenvatinib 02/05/2023 Cycle 2 Pembrolizumab 02/27/2023, lenvatinib placed on  hold due to mucositis and hypertension   2.  Left lower extremity lymphedema, improved after therapy in the lymphedema clinic   3.  Chronic diarrhea controlled with Lomotil  Disposition: Dana [name] appears stable.  She has completed 2 cycles of Pembrolizumab.  Lenvatinib was placed on hold last week due to mucositis and hypertension.  The mucositis is better.  She continues to have mild hypertension.  She is unable to take amlodipine due to a headache.  We discussed Cozaar.  She would like to hold off on blood pressure medication for now and reevaluate at her next visit.  She has symptoms suggestive of hand-foot syndrome with redness and a "burning" type sensation on the palms and soles.  She has mild diarrhea.  We discussed symptoms being due to lenvatinib and/or Pembrolizumab.  She will continue to hold lenvatinib.  She will return for lab, follow-up, possible Pembrolizumab in 2 weeks.  She will contact the office in the interim with any problems.  Patient seen with Dr. Truett Perna.    Lonna Cobb ANP/GNP-BC   03/06/2023  4:02 PM This was a shared visit with Lonna Cobb.  Dana [name] was interviewed and examined.  The rt. Inguinal mass appears smaller.  She has developed mild hand/foot syndrome and diarrhea.  Mucositis has improved.  Her symptoms could be related to a residual effect of lenvatinib versus toxicity from pembrolizumab.  The plan is to keep lenvatinib on hold.  I was present for greater than 50% of today's visit.  I performed medical decision making.  Mancel Bale, MD

## 2023-03-06 NOTE — Progress Notes (Signed)
Follow-up completed with patient via telephone for weight loss.  Patient is followed by Dr. Truett Perna for uterine cancer.  She has been receiving lenvatinib and pembrolizumab.  Lenvatinib on hold due to mucositis and hypertension.  Weight documented as 164 pounds 3 ounces on August 13.  This is decreased from 171 pounds 6 ounces on July 9.  Patient admits to mucositis/mouth sores and decreased appetite leading to weight loss.  Patient has had chronic diarrhea which was controlled with Lomotil however patient reports this is now increased again.  She reports that lately her stomach has been upset and describes nausea.  Patient does not tolerate meats, dairy or eggs and has been trying to follow a more vegan diet.  Reports she will drink coconut milk.  Tolerates white rice and potatoes.  Does not care for bananas.  States applesauce was not tolerated recently.  Nutrition diagnosis: Food and nutrition related knowledge deficit, ongoing.  Intervention: Continue low fiber diet and modify for food preferences/tolerances. Suggested coconut yogurts, high-protein almond milk, and other soft foods.  Try increasing white rice daily. Recommended baking soda and salt water gargle for mouth sores.  Provided recipe. Increased calories and protein by preparing a smoothie with tolerated ingredients.  Add high-calorie foods. Will send nutrition facts sheets by email ([email or web address]) along with contact information.  Monitoring, evaluation, goals: Patient will tolerate increased calories and protein to minimize further weight loss.  Next visit: To be scheduled as needed.  **Disclaimer: This note was dictated with voice recognition software. Similar sounding words can inadvertently be transcribed and this note may contain transcription errors which may not have been corrected upon publication of note.**

## 2023-03-07 ENCOUNTER — Encounter: Payer: Self-pay | Admitting: Oncology

## 2023-03-14 ENCOUNTER — Encounter: Payer: Self-pay | Admitting: Oncology

## 2023-03-14 ENCOUNTER — Other Ambulatory Visit: Payer: Self-pay

## 2023-03-15 ENCOUNTER — Telehealth: Payer: Self-pay | Admitting: *Deleted

## 2023-03-15 ENCOUNTER — Other Ambulatory Visit: Payer: Self-pay | Admitting: Nurse Practitioner

## 2023-03-15 DIAGNOSIS — C55 Malignant neoplasm of uterus, part unspecified: Secondary | ICD-10-CM

## 2023-03-15 MED ORDER — LOSARTAN POTASSIUM 25 MG PO TABS
25.0000 mg | ORAL_TABLET | Freq: Every day | ORAL | 2 refills | Status: DC
Start: 2023-03-15 — End: 2023-06-04

## 2023-03-15 NOTE — Telephone Encounter (Signed)
Dana [name] called and left VM that her BP is not coming down. Thinks she may need to start the losartan. Wants it called to [facility] on [address]. Informed patient that NP is sending in the script for her.

## 2023-03-20 ENCOUNTER — Inpatient Hospital Stay (HOSPITAL_BASED_OUTPATIENT_CLINIC_OR_DEPARTMENT_OTHER): Payer: Medicare HMO | Admitting: Nurse Practitioner

## 2023-03-20 ENCOUNTER — Inpatient Hospital Stay: Payer: Medicare HMO | Attending: Hematology and Oncology

## 2023-03-20 ENCOUNTER — Inpatient Hospital Stay: Payer: Medicare HMO

## 2023-03-20 ENCOUNTER — Encounter: Payer: Self-pay | Admitting: Nurse Practitioner

## 2023-03-20 VITALS — BP 153/87 | HR 61

## 2023-03-20 VITALS — BP 140/90 | HR 64 | Temp 98.1°F | Resp 16 | Wt 168.4 lb

## 2023-03-20 DIAGNOSIS — Z5112 Encounter for antineoplastic immunotherapy: Secondary | ICD-10-CM | POA: Insufficient documentation

## 2023-03-20 DIAGNOSIS — C55 Malignant neoplasm of uterus, part unspecified: Secondary | ICD-10-CM

## 2023-03-20 DIAGNOSIS — Z7189 Other specified counseling: Secondary | ICD-10-CM

## 2023-03-20 DIAGNOSIS — C541 Malignant neoplasm of endometrium: Secondary | ICD-10-CM | POA: Diagnosis not present

## 2023-03-20 DIAGNOSIS — Z79620 Long term (current) use of immunosuppressive biologic: Secondary | ICD-10-CM | POA: Insufficient documentation

## 2023-03-20 DIAGNOSIS — C774 Secondary and unspecified malignant neoplasm of inguinal and lower limb lymph nodes: Secondary | ICD-10-CM | POA: Diagnosis not present

## 2023-03-20 DIAGNOSIS — C7989 Secondary malignant neoplasm of other specified sites: Secondary | ICD-10-CM | POA: Diagnosis not present

## 2023-03-20 LAB — CMP (CANCER CENTER ONLY)
ALT: 11 U/L (ref 0–44)
AST: 21 U/L (ref 15–41)
Albumin: 3.9 g/dL (ref 3.5–5.0)
Alkaline Phosphatase: 58 U/L (ref 38–126)
Anion gap: 12 (ref 5–15)
BUN: 15 mg/dL (ref 8–23)
CO2: 21 mmol/L — ABNORMAL LOW (ref 22–32)
Calcium: 8.8 mg/dL — ABNORMAL LOW (ref 8.9–10.3)
Chloride: 102 mmol/L (ref 98–111)
Creatinine: 0.93 mg/dL (ref 0.44–1.00)
GFR, Estimated: >60 mL/min (ref >60)
Glucose, Bld: 86 mg/dL (ref 70–99)
Potassium: 4 mmol/L (ref 3.5–5.1)
Sodium: 135 mmol/L (ref 135–145)
Total Bilirubin: 0.5 mg/dL (ref 0.3–1.2)
Total Protein: 6.6 g/dL (ref 6.5–8.1)

## 2023-03-20 LAB — CBC WITH DIFFERENTIAL (CANCER CENTER ONLY)
Abs Immature Granulocytes: 0.01 10*3/uL (ref 0.00–0.07)
Basophils Absolute: 0 10*3/uL (ref 0.0–0.1)
Basophils Relative: 1 %
Eosinophils Absolute: 0.2 10*3/uL (ref 0.0–0.5)
Eosinophils Relative: 3 %
HCT: 46.4 % — ABNORMAL HIGH (ref 36.0–46.0)
Hemoglobin: 15.7 g/dL — ABNORMAL HIGH (ref 12.0–15.0)
Immature Granulocytes: 0 %
Lymphocytes Relative: 24 %
Lymphs Abs: 1.4 10*3/uL (ref 0.7–4.0)
MCH: 31.2 pg (ref 26.0–34.0)
MCHC: 33.8 g/dL (ref 30.0–36.0)
MCV: 92.1 fL (ref 80.0–100.0)
Monocytes Absolute: 0.4 10*3/uL (ref 0.1–1.0)
Monocytes Relative: 7 %
Neutro Abs: 3.8 10*3/uL (ref 1.7–7.7)
Neutrophils Relative %: 65 %
Platelet Count: 245 10*3/uL (ref 150–400)
RBC: 5.04 MIL/uL (ref 3.87–5.11)
RDW: 15.3 % (ref 11.5–15.5)
WBC Count: 5.8 10*3/uL (ref 4.0–10.5)
nRBC: 0 % (ref 0.0–0.2)

## 2023-03-20 LAB — TOTAL PROTEIN, URINE DIPSTICK: Protein, ur: NEGATIVE mg/dL

## 2023-03-20 LAB — TSH: TSH: 159.99 u[IU]/mL — ABNORMAL HIGH (ref 0.350–4.500)

## 2023-03-20 MED ORDER — SODIUM CHLORIDE 0.9 % IV SOLN: Freq: Once | INTRAVENOUS | Status: AC

## 2023-03-20 MED ORDER — SODIUM CHLORIDE 0.9 % IV SOLN
200.0000 mg | Freq: Once | INTRAVENOUS | Status: AC
  Administered 2023-03-20: 200 mg via INTRAVENOUS
  Filled 2023-03-20: qty 8

## 2023-03-20 NOTE — Progress Notes (Signed)
Patient seen by Lisa Thomas NP today  Vitals are within treatment parameters.  Labs reviewed by Lisa Thomas NP and are within treatment parameters.  Per physician team, patient is ready for treatment and there are NO modifications to the treatment plan.     

## 2023-03-20 NOTE — Patient Instructions (Signed)
[facility]   Discharge Instructions: Thank you for choosing [facility] to provide your oncology and hematology care.   If you have a lab appointment with the Cancer Center, please go directly to the Cancer Center and check in at the registration area.   Wear comfortable clothing and clothing appropriate for easy access to any Portacath or PICC line.   We strive to give you quality time with your provider. You may need to reschedule your appointment if you arrive late (15 or more minutes).  Arriving late affects you and other patients whose appointments are after yours.  Also, if you miss three or more appointments without notifying the office, you may be dismissed from the clinic at the provider's discretion.      For prescription refill requests, have your pharmacy contact our office and allow 72 hours for refills to be completed.    Today you received the following chemotherapy and/or immunotherapy agents Keytruda       To help prevent nausea and vomiting after your treatment, we encourage you to take your nausea medication as directed.  BELOW ARE SYMPTOMS THAT SHOULD BE REPORTED IMMEDIATELY: *FEVER GREATER THAN 100.4 F (38 C) OR HIGHER *CHILLS OR SWEATING *NAUSEA AND VOMITING THAT IS NOT CONTROLLED WITH YOUR NAUSEA MEDICATION *UNUSUAL SHORTNESS OF BREATH *UNUSUAL BRUISING OR BLEEDING *URINARY PROBLEMS (pain or burning when urinating, or frequent urination) *BOWEL PROBLEMS (unusual diarrhea, constipation, pain near the anus) TENDERNESS IN MOUTH AND THROAT WITH OR WITHOUT PRESENCE OF ULCERS (sore throat, sores in mouth, or a toothache) UNUSUAL RASH, SWELLING OR PAIN  UNUSUAL VAGINAL DISCHARGE OR ITCHING   Items with * indicate a potential emergency and should be followed up as soon as possible or go to the Emergency Department if any problems should occur.  Please show the CHEMOTHERAPY ALERT CARD or IMMUNOTHERAPY ALERT CARD at  check-in to the Emergency Department and triage nurse.  Should you have questions after your visit or need to cancel or reschedule your appointment, please contact [facility]  Dept: [phone]  and follow the prompts.  Office hours are 8:00 a.m. to 4:30 p.m. Monday - Friday. Please note that voicemails left after 4:00 p.m. may not be returned until the following business day.  We are closed weekends and major holidays. You have access to a nurse at all times for urgent questions. Please call the main number to the clinic Dept: [phone] and follow the prompts.   For any non-urgent questions, you may also contact your provider using MyChart. We now offer e-Visits for anyone 18 and older to request care online for non-urgent symptoms. For details visit [email or web address].   Also download the MyChart app! Go to the app store, search "MyChart", open the app, select [facility], and log in with your MyChart username and password.  Pembrolizumab Injection What is this medication? PEMBROLIZUMAB (PEM broe LIZ ue mab) treats some types of cancer. It works by helping your immune system slow or stop the spread of cancer cells. It is a monoclonal antibody. This medicine may be used for other purposes; ask your health care provider or pharmacist if you have questions. COMMON BRAND NAME(S): Keytruda What should I tell my care team before I take this medication? They need to know if you have any of these conditions: Allogeneic stem cell transplant (uses someone else's stem cells) Autoimmune diseases, such as Crohn disease, ulcerative colitis, lupus History of chest radiation Nervous   system problems, such as Guillain-Barre syndrome, myasthenia gravis Organ transplant An unusual or allergic reaction to pembrolizumab, other medications, foods, dyes, or preservatives Pregnant or trying to get pregnant Breast-feeding How should I use this medication? This medication  is injected into a vein. It is given by your care team in a hospital or clinic setting. A special MedGuide will be given to you before each treatment. Be sure to read this information carefully each time. Talk to your care team about the use of this medication in children. While it may be prescribed for children as young as 6 months for selected conditions, precautions do apply. Overdosage: If you think you have taken too much of this medicine contact a poison control center or emergency room at once. NOTE: This medicine is only for you. Do not share this medicine with others. What if I miss a dose? Keep appointments for follow-up doses. It is important not to miss your dose. Call your care team if you are unable to keep an appointment. What may interact with this medication? Interactions have not been studied. This list may not describe all possible interactions. Give your health care provider a list of all the medicines, herbs, non-prescription drugs, or dietary supplements you use. Also tell them if you smoke, drink alcohol, or use illegal drugs. Some items may interact with your medicine. What should I watch for while using this medication? Your condition will be monitored carefully while you are receiving this medication. You may need blood work while taking this medication. This medication may cause serious skin reactions. They can happen weeks to months after starting the medication. Contact your care team right away if you notice fevers or flu-like symptoms with a rash. The rash may be red or purple and then turn into blisters or peeling of the skin. You may also notice a red rash with swelling of the face, lips, or lymph nodes in your neck or under your arms. Tell your care team right away if you have any change in your eyesight. Talk to your care team if you may be pregnant. Serious birth defects can occur if you take this medication during pregnancy and for 4 months after the last dose. You  will need a negative pregnancy test before starting this medication. Contraception is recommended while taking this medication and for 4 months after the last dose. Your care team can help you find the option that works for you. Do not breastfeed while taking this medication and for 4 months after the last dose. What side effects may I notice from receiving this medication? Side effects that you should report to your care team as soon as possible: Allergic reactions--skin rash, itching, hives, swelling of the face, lips, tongue, or throat Dry cough, shortness of breath or trouble breathing Eye pain, redness, irritation, or discharge with blurry or decreased vision Heart muscle inflammation--unusual weakness or fatigue, shortness of breath, chest pain, fast or irregular heartbeat, dizziness, swelling of the ankles, feet, or hands Hormone gland problems--headache, sensitivity to light, unusual weakness or fatigue, dizziness, fast or irregular heartbeat, increased sensitivity to cold or heat, excessive sweating, constipation, hair loss, increased thirst or amount of urine, tremors or shaking, irritability Infusion reactions--chest pain, shortness of breath or trouble breathing, feeling faint or lightheaded Kidney injury (glomerulonephritis)--decrease in the amount of urine, red or dark brown urine, foamy or bubbly urine, swelling of the ankles, hands, or feet Liver injury--right upper belly pain, loss of appetite, nausea, light-colored stool, dark yellow or   brown urine, yellowing skin or eyes, unusual weakness or fatigue Pain, tingling, or numbness in the hands or feet, muscle weakness, change in vision, confusion or trouble speaking, loss of balance or coordination, trouble walking, seizures Rash, fever, and swollen lymph nodes Redness, blistering, peeling, or loosening of the skin, including inside the mouth Sudden or severe stomach pain, bloody diarrhea, fever, nausea, vomiting Side effects that  usually do not require medical attention (report to your care team if they continue or are bothersome): Bone, joint, or muscle pain Diarrhea Fatigue Loss of appetite Nausea Skin rash This list may not describe all possible side effects. Call your doctor for medical advice about side effects. You may report side effects to FDA at 1-800-FDA-1088. Where should I keep my medication? This medication is given in a hospital or clinic. It will not be stored at home. NOTE: This sheet is a summary. It may not cover all possible information. If you have questions about this medicine, talk to your doctor, pharmacist, or health care provider.  2024 Elsevier/Gold Standard (2021-11-15 00:00:00)   

## 2023-03-20 NOTE — Progress Notes (Signed)
[facility] OFFICE PROGRESS NOTE   Diagnosis: Endometrial cancer  INTERVAL HISTORY:   Dana [name] returns as scheduled.  She completed cycle 2 Pembrolizumab 02/27/2023.  Lenvatinib was placed on hold same day due to mucositis and hypertension.  Last office visit 03/06/2023 mucositis had improved but she had developed mild hand-foot syndrome and diarrhea.  She continued to have hypertension.  She decided to continue to monitor her blood pressure at home rather than begin an antihypertensive medication.  She contacted the office 03/15/2023 to report persistent elevated blood pressure readings and requested to begin losartan.  Prescription sent to her pharmacy.  Lenvatinib remains on hold.  She is feeling better.  She notes improvement in her blood pressure since beginning Cozaar over the weekend.  Hands and feet are healing.  She reports a single mouth sore under the tongue.  Diarrhea has resolved.  She complains of a "burning, pins-and-needles" sensation in the feet and lower legs at nighttime.  She has had this in the past but notes some increase.  Objective:  Vital signs in last 24 hours:  Blood pressure (!) 140/90, pulse 64, temperature 98.1 F (36.7 C), temperature source Tympanic, resp. rate 16, weight 168 lb 6.4 oz (76.4 kg), SpO2 99%.    HEENT: No thrush or ulcers. Lymphatics: Firm mass right inguinal region. Resp: Lungs clear bilaterally. Cardio: Regular rate and rhythm. GI: No hepatosplenomegaly. Vascular: Edema throughout the left leg. Skin: Palms and soles with mild erythema, no skin breakdown.   Lab Results:  Lab Results  Component Value Date   WBC 5.8 03/20/2023   HGB 15.7 (H) 03/20/2023   HCT 46.4 (H) 03/20/2023   MCV 92.1 03/20/2023   PLT 245 03/20/2023   NEUTROABS 3.8 03/20/2023    Imaging:  No results found.  Medications: I have reviewed the patient's current medications.  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 June 2015, status post a TAH, BSO, appendectomy, pelvic and periaortic lymphadenectomy, and wedge resection of a liver lesion Gross complete resection, tumor involve both ovaries, lymph nodes negative 6 cycles of adjuvant Taxol/carboplatin CT abdomen/pelvis 03/28/2019-enlarged right inguinal and iliac nodes 04/10/2019-right inguinal lymph node biopsy-metastatic adenocarcinoma, ER 90%, PR 95%, PD-L1 combined positive score-1, MSS, tumor mutation burden-4 Cisplatin 05/16/2019 20-1 cycle Radiation to the lateral pelvis and 1 lymph nodes November 2020 CT 12/72/020-decrease size of right inguinal and external iliac lymph nodes, no evidence of disease progression Everolimus and letrozole 07/14/2019 - 04/06/2020 CT 10/20/2019-resolution of right iliac lymph node, decrease in size of right inguinal node CT 03/08/2020-no evidence of recurrent or metastatic disease in the abdomen or pelvis CT 05/31/2020-no evidence of recurrent or metastatic disease CT 03/11/2021-increase right inguinal lymph node, enlargement of bilateral inguinal lymph nodes, subcutaneous soft tissue nodule at the anterior right thigh Carboplatin 04/06/2021, 04/25/2021, 05/16/2021 CT 07/12/2021-mild decrease in size of bilateral inguinal lymphadenopathy, no progressive disease in the abdomen or pelvis, 0.6 cm left inguinal node was 0.8 cm previously Letrozole 08/06/2021 CT abdomen/pelvis 04/10/2022-stable right inguinal lymph node, no new site of metastatic disease CT right femur  04/10/2022 -2.2 x 1 cm anterior subcutaneous mass Letrozole continued CT right femur 07/19/2022-mild enlargement of right thigh subcutaneous mass stable right inguinal nodal mass CTs 07/19/2022-stable right inguinal lymph node, no new or progressive disease 11/20/2022-right thigh mass larger and tender, letrozole discontinued 11/28/2022-significant enlargement of anterior right thigh soft tissue mass, slightly larger right inguinal mass 12/25/2022-wide local  excision of right thigh mass, metastatic endometrial adenocarcinoma, ER  positive, PR positive, negative resection margins Cycle 1 pembrolizumab/lenvatinib 02/05/2023 Cycle 2 Pembrolizumab 02/27/2023, lenvatinib placed on hold due to mucositis and hypertension Cycle 3 Pembrolizumab 03/20/2023, lenvatinib resumed at a dose of 10 mg daily   2.  Left lower extremity lymphedema, improved after therapy in the lymphedema clinic   3.  Chronic diarrhea controlled with Lomotil  Disposition: [name] appears improved.  Mucositis, diarrhea and hand-foot changes have resolved.  Blood pressure is under better control since beginning Cozaar.  Plan to proceed with Pembrolizumab today as scheduled.  She will resume lenvatinib at a reduced dose of 10 mg daily.  She understands to contact the office if any of the above recur.  She will check her blood pressure on a regular basis and contact the office with persistent elevated readings.  She will return for follow-up and treatment in 3 weeks.  We are available to see her sooner if needed.  Plan reviewed with Dr. Truett Perna.    Lonna Cobb ANP/GNP-BC   03/20/2023  11:44 AM

## 2023-03-21 ENCOUNTER — Other Ambulatory Visit: Payer: Self-pay

## 2023-03-21 LAB — T4: T4, Total: 0.9 ug/dL — ABNORMAL LOW (ref 4.5–12.0)

## 2023-03-22 ENCOUNTER — Other Ambulatory Visit: Payer: Self-pay | Admitting: Nurse Practitioner

## 2023-03-22 DIAGNOSIS — C55 Malignant neoplasm of uterus, part unspecified: Secondary | ICD-10-CM

## 2023-03-22 DIAGNOSIS — E032 Hypothyroidism due to medicaments and other exogenous substances: Secondary | ICD-10-CM

## 2023-03-22 MED ORDER — LEVOTHYROXINE SODIUM 25 MCG PO TABS: 25.0000 ug | ORAL_TABLET | Freq: Every day | ORAL | 2 refills | Status: DC

## 2023-03-23 ENCOUNTER — Telehealth: Payer: Self-pay

## 2023-03-23 NOTE — Telephone Encounter (Signed)
Patient gave verbal understanding and had no further questions or concerns  

## 2023-03-23 NOTE — Telephone Encounter (Signed)
-----   Message from Lonna Cobb sent at 03/22/2023  8:27 AM EDT ----- Please let her know labs are consistent with hypothyroidism.  I am sending a prescription to her pharmacy for Synthroid.

## 2023-03-29 ENCOUNTER — Telehealth: Payer: Self-pay | Admitting: *Deleted

## 2023-03-29 NOTE — Telephone Encounter (Signed)
Informed her that she will be seen by NP tomorrow at 11:15

## 2023-03-29 NOTE — Telephone Encounter (Addendum)
Ms. Hanahan reports stopping her lenvatinib today due to hypertension (168/110 and 149/96). Also reports her feet and fingers tingle and burn (no discoloration or breakdown). Also she stopped her levothyroxine on 9/11 due to report of severe headache beginning Sunday and thinks she had headaches in past on this.  Asking if she could resume Afinitor as she took in the past as it was pretty well tolerated? Does MD want to see her soon or wait till appointment on 9/23 visit w/Keytruda?  Confirmed she is taking the losartan 25 mg daily.

## 2023-03-30 ENCOUNTER — Other Ambulatory Visit (HOSPITAL_COMMUNITY): Payer: Self-pay

## 2023-03-30 ENCOUNTER — Telehealth: Payer: Self-pay | Admitting: Pharmacist

## 2023-03-30 ENCOUNTER — Inpatient Hospital Stay (HOSPITAL_BASED_OUTPATIENT_CLINIC_OR_DEPARTMENT_OTHER): Payer: Medicare HMO | Admitting: Nurse Practitioner

## 2023-03-30 ENCOUNTER — Encounter: Payer: Self-pay | Admitting: Nurse Practitioner

## 2023-03-30 ENCOUNTER — Telehealth: Payer: Self-pay

## 2023-03-30 VITALS — BP 142/90 | HR 80 | Temp 98.1°F | Resp 18 | Ht 62.0 in | Wt 170.8 lb

## 2023-03-30 DIAGNOSIS — C55 Malignant neoplasm of uterus, part unspecified: Secondary | ICD-10-CM | POA: Diagnosis not present

## 2023-03-30 DIAGNOSIS — Z5112 Encounter for antineoplastic immunotherapy: Secondary | ICD-10-CM | POA: Diagnosis not present

## 2023-03-30 MED ORDER — EVEROLIMUS 5 MG PO TABS
5.0000 mg | ORAL_TABLET | Freq: Every day | ORAL | 0 refills | Status: DC
  Filled 2023-03-30: qty 30, 30d supply, fill #0

## 2023-03-30 NOTE — Addendum Note (Signed)
Addended by: Rana Snare on: 03/30/2023 04:37 PM   Modules accepted: Orders

## 2023-03-30 NOTE — Progress Notes (Signed)
[facility] OFFICE PROGRESS NOTE   Diagnosis: Endometrial cancer endometrial cancer  INTERVAL HISTORY:   Dana [name] returns prior to scheduled follow-up.  She completed cycle 3 Pembrolizumab 03/20/2023.  Lenvatinib was resumed at a reduced dose 03/20/2023, had previously been placed on hold due to mucositis and hypertension.  She has also had symptoms suggestive of hand-foot syndrome.  All symptoms had improved when lenvatinib was resumed 03/20/2023.  She contacted the office yesterday to report recurrent hypertension despite Cozaar and a burning sensation in the hands and feet.  She notes lower lip soreness the past few days.  Hands and feet "burning" after about 4 doses of lenvatinib.  No skin ulceration.  She notes the left foot is red.  She has had nausea this week.  No vomiting.  No diarrhea.  She discontinue Synthroid after 2 doses due to headaches.  She remembers having headaches in the past when she tried to take Synthroid.  Objective:  Vital signs in last 24 hours:  Blood pressure (!) 142/90, pulse 80, temperature 98.1 F (36.7 C), temperature source Oral, resp. rate 18, height 5\' 2"  (1.575 m), weight 170 lb 12.8 oz (77.5 kg), SpO2 100%.    HEENT: Small erythema oral mucosa.  Lower lip is mildly erythematous.  No ulcerations. Lymphatics: Approximate 4 cm mass right inguinal region. Resp: Lungs clear bilaterally. Cardio: Regular rate and rhythm. GI: No hepatosplenomegaly. Vascular: Edema throughout the left leg. Skin: Palms with skin thickening.  Sole of the left foot is erythematous, dry appearing.   Lab Results:  Lab Results  Component Value Date   WBC 5.8 03/20/2023   HGB 15.7 (H) 03/20/2023   HCT 46.4 (H) 03/20/2023   MCV 92.1 03/20/2023   PLT 245 03/20/2023   NEUTROABS 3.8 03/20/2023    Imaging:  No results found.  Medications: I have reviewed the patient's current medications.  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 June 2015, status post a TAH, BSO, appendectomy, pelvic and periaortic lymphadenectomy, and wedge resection of a liver lesion Gross complete resection, tumor involve both ovaries, lymph nodes negative 6 cycles of adjuvant Taxol/carboplatin CT abdomen/pelvis 03/28/2019-enlarged right inguinal and iliac nodes 04/10/2019-right inguinal lymph node biopsy-metastatic adenocarcinoma, ER 90%, PR 95%, PD-L1 combined positive score-1, MSS, tumor mutation burden-4 Cisplatin 05/16/2019 20-1 cycle Radiation to the lateral pelvis and 1 lymph nodes November 2020 CT 12/72/020-decrease size of right inguinal and external iliac lymph nodes, no evidence of disease progression Everolimus and letrozole 07/14/2019 - 04/06/2020 CT 10/20/2019-resolution of right iliac lymph node, decrease in size of right inguinal node CT 03/08/2020-no evidence of recurrent or metastatic disease in the abdomen or pelvis CT 05/31/2020-no evidence of recurrent or metastatic disease CT 03/11/2021-increase right inguinal lymph node, enlargement of bilateral inguinal lymph nodes, subcutaneous soft tissue nodule at the anterior right thigh Carboplatin 04/06/2021, 04/25/2021, 05/16/2021 CT 07/12/2021-mild decrease in size of bilateral inguinal lymphadenopathy, no progressive disease in the abdomen or pelvis, 0.6 cm left inguinal node was 0.8 cm previously Letrozole 08/06/2021 CT abdomen/pelvis 04/10/2022-stable right inguinal lymph node, no new site of metastatic disease CT right femur  04/10/2022 -2.2 x 1 cm anterior subcutaneous mass Letrozole continued CT right femur 07/19/2022-mild enlargement of right thigh subcutaneous mass stable right inguinal nodal mass CTs 07/19/2022-stable right inguinal lymph node, no new or progressive disease 11/20/2022-right thigh mass larger and tender, letrozole discontinued 11/28/2022-significant enlargement of anterior right thigh soft tissue mass, slightly larger right inguinal mass 12/25/2022-wide local  excision of right  thigh mass, metastatic endometrial adenocarcinoma, ER positive, PR positive, negative resection margins Cycle 1 pembrolizumab/lenvatinib 02/05/2023 Cycle 2 Pembrolizumab 02/27/2023, lenvatinib placed on hold due to mucositis and hypertension Cycle 3 Pembrolizumab 03/20/2023, lenvatinib resumed at a dose of 10 mg daily Lenvatinib discontinued 03/30/2023 due to mucositis, hand-foot symptoms   2.  Left lower extremity lymphedema, improved after therapy in the lymphedema clinic   3.  Chronic diarrhea controlled with Lomotil  Disposition: Ms. Ryczek appears stable.  She developed recurrent mucositis and symptoms suggestive of hand-foot syndrome on the reduced dose of lenvatinib.  Lenvatinib will be discontinued.  Dr. Truett Perna reviewed other treatment options including chemotherapy with Taxol/carboplatin, single agent carboplatin.  She declines those options due to previous poor tolerance.  We discussed retreatment with Afinitor/letrozole.  She feels she tolerated the reduced dose of Afinitor well and would like to resume with letrozole.  We reviewed potential toxicities associated with Afinitor including bone marrow toxicity, mouth sores, diarrhea, rash, delayed wound healing, increased risk of blood clots.  She agrees to proceed.  She is unable to tolerate Synthroid.  We will ask for assistance from Dr. Timothy Lasso for management of hypothyroidism.  She will return for follow-up in approximately 3 weeks.  We are available to see her sooner if needed.  Patient seen with Dr. Truett Perna.    Lonna Cobb ANP/GNP-BC   03/30/2023  11:26 AM  This was a shared visit with Lonna Cobb.  Ms. Sublett was interviewed and examined.  She is unable to call right lenvatinib.  Lenvatinib will be discontinued.  We discussed treatment options.  She feels the tumor previously responded to Afinitor and letrozole.  She reports tolerating the Afinitor well at the 5 mg dose.  We also discussed repeat treatment with  carboplatin.  She does not wish to receive carboplatin or Taxol.  I was present for greater than 50% of today's visit.  I performed medical decision making.  Mancel Bale, MD

## 2023-03-30 NOTE — Telephone Encounter (Signed)
Oral Oncology Patient Advocate Encounter  Prior Authorization for Everolimus has been approved.    PA# [id number]  Effective dates: 07/17/22 through 07/17/23  Patients co-pay is $3,577.91.   PAP initiated. See additional encounter.    Ardeen Fillers, CPhT Oncology Pharmacy Patient Advocate  [facility]  [phone] (phone) [phone] (fax) 03/30/2023 1:24 PM

## 2023-03-30 NOTE — Telephone Encounter (Signed)
Oral Oncology Patient Advocate Encounter  New authorization   Received notification that prior authorization for Everolimus is required.   PA submitted on 03/30/23  Key [id number]  Status is pending     Ardeen Fillers, CPhT Oncology Pharmacy Patient Advocate  [facility]  [phone] (phone) [phone] (fax) 03/30/2023 1:16 PM

## 2023-04-02 ENCOUNTER — Telehealth: Payer: Self-pay

## 2023-04-02 ENCOUNTER — Other Ambulatory Visit: Payer: Self-pay

## 2023-04-02 NOTE — Telephone Encounter (Addendum)
Called patient to set up time to obtain financial documents required to send in with application.   1st attempt - LVM 04/02/23  Patient returned my call (04/02/23). Patient will obtain a copy of their Social Security Awards Letter and drop that off at MD Office for submission to PANO/ NPAF. Patient will call me once they have dropped of documents. I will continue to follow and update until final determination.    Ardeen Fillers, CPhT Oncology Pharmacy Patient Advocate  [facility]  [phone] (phone) [phone] (fax) 04/02/2023 11:29 AM

## 2023-04-02 NOTE — Telephone Encounter (Signed)
Oral Oncology Patient Advocate Encounter   Submitted application for assistance for Afinitor to [employer]O/ NPAF.   Application submitted via e-fax to [phone]   [facility] phone number [phone].   I will continue to check the status until final determination.    Ardeen Fillers, CPhT Oncology Pharmacy Patient Advocate  [facility]  [phone] (phone) [phone] (fax) 04/02/2023 4:18 PM

## 2023-04-02 NOTE — Telephone Encounter (Signed)
Oral Oncology Patient Advocate Encounter   Began application for assistance for Afinitor through PANO/ NPAF.   Application will be submitted upon completion of necessary supporting documentation.   PAN[employer]'s phone number [phone].   I will continue to check the status until final determination.    Ardeen Fillers, CPhT Oncology Pharmacy Patient Advocate  [facility]  [phone] (phone) [phone] (fax) 04/02/2023 11:15 AM

## 2023-04-02 NOTE — Telephone Encounter (Signed)
Received back MD signatures. I will submit once I have patient's financial documents in hand. I will continue to follow and update until final determination.    Ardeen Fillers, CPhT Oncology Pharmacy Patient Advocate  [facility]  [phone] (phone) [phone] (fax) 04/02/2023 2:41 PM

## 2023-04-02 NOTE — Telephone Encounter (Signed)
-----   Message from Lonna Cobb sent at 03/30/2023  4:37 PM EDT ----- Please contact Dr. Ferd Hibbs office-request assistance with management of hypothyroidism, possibly treatment related, she is unable to tolerate Synthroid.

## 2023-04-02 NOTE — Telephone Encounter (Unsigned)
Clinical Pharmacist Practitioner Encounter   Received new prescription for Afinitor (everolimus) for the treatment of metastatic endometrial cancer in conjunction with letrozole, planned duration until disease progression or unacceptable drug toxicity.  CMP from 03/20/23 assessed, no relevant lab abnormalities. Prescription dose and frequency assessed.   Current medication list in Epic reviewed, no DDIs with everolimus identified.   Evaluated chart and no patient barriers to medication adherence identified.   Prescription has been e-scribed to the [facility] for benefits analysis and approval.  Oral Oncology Clinic will continue to follow for insurance authorization, copayment issues, initial counseling and start date.   Remi Haggard, PharmD, BCPS, BCOP, CPP Hematology/Oncology Clinical Pharmacist Practitioner [place] [phone]  04/02/2023 11:25 AM

## 2023-04-03 ENCOUNTER — Encounter: Payer: Self-pay | Admitting: Oncology

## 2023-04-03 ENCOUNTER — Other Ambulatory Visit: Payer: Self-pay

## 2023-04-03 ENCOUNTER — Other Ambulatory Visit: Payer: Self-pay | Admitting: Oncology

## 2023-04-03 NOTE — Telephone Encounter (Signed)
Oral Oncology Patient Advocate Encounter  Received fax update from [facility] regarding application for assistance for Afinitor.  Patient's application has entered the benefits review process.  Additionally, a fax was received from [facility] stating the patient is ineligible for the 14 day free trial due to their diagnosis code.  Jinger Neighbors, CPhT-Adv Oncology Pharmacy Patient Advocate [facility] Direct Number: [phone]  Fax: [phone]

## 2023-04-04 ENCOUNTER — Other Ambulatory Visit (HOSPITAL_COMMUNITY): Payer: Self-pay

## 2023-04-04 NOTE — Telephone Encounter (Signed)
**  Received notification from [facility] that a Prior Authorization was needed for brand Afinitor.**  Oral Oncology Patient Advocate Encounter  New authorization   Received notification that prior authorization for Afinitor is required.   PA submitted on 04/04/23 Key [id number] Status is pending     Oral Oncology Patient Advocate Encounter  Prior Authorization for Afinitor has been approved.    PA# [id number] Effective dates: 07/17/22 through 07/17/23  Patients co-pay is $2,169.60.    I have called PAN[employer] and let them know Prior Auth has been approved for brand Afinitor. I will continue to follow and update until final determination.    Ardeen Fillers, CPhT Oncology Pharmacy Patient Advocate  [facility]  [phone] (phone) [phone] (fax) 04/04/2023 10:45 AM

## 2023-04-06 NOTE — Telephone Encounter (Signed)
Received notification via fax that Benefits Investigation was in process and that I would receive further notification once completed. I will continue to follow and update until final determination.    Ardeen Fillers, CPhT Oncology Pharmacy Patient Advocate  [facility]  [phone] (phone) [phone] (fax) 04/06/2023 8:22 AM

## 2023-04-09 ENCOUNTER — Inpatient Hospital Stay: Payer: Medicare HMO | Admitting: Nurse Practitioner

## 2023-04-09 ENCOUNTER — Inpatient Hospital Stay: Payer: Medicare HMO

## 2023-04-09 NOTE — Telephone Encounter (Signed)
Called and spoke to patient regarding approval for NPAF. Patient knows to call NPAF at [phone] option [address] first order of medication for delivery. Patient indicated they would call first thing tomorrow morning, Tuesday, 04/10/23. Patient also knows to call me at [phone] with any questions or issues regarding ordering or receiving medication from NPAF.    Dana [name], CPhT Oncology Pharmacy Patient Advocate  [facility]  [phone] (phone) [phone] (fax) 04/09/2023 3:47 PM

## 2023-04-09 NOTE — Telephone Encounter (Signed)
Called to check status of application. Informed by representative that Benefits Investigation had just completed. Informed representative that patient could not afford co-pay and representative has referred application to [employer] ([employer]PAF) for final processing. Informed to expect notification in 2 to 3 business days from NPAF letting me know of approval or denial to program. I will continue to follow and update until final determination.   NPAF's phone number [phone]   Ardeen Fillers, CPhT Oncology Pharmacy Patient Advocate  [facility]  [phone] (phone) [phone] (fax) 04/09/2023 10:43 AM

## 2023-04-09 NOTE — Telephone Encounter (Addendum)
Oral Oncology Patient Advocate Encounter   Received notification that the application for assistance for Afinitor through [employer] has been approved.   NPAF's phone number [phone].   Effective dates: 04/09/23 through 07/17/23  Medication will be filled at [facility].   Ardeen Fillers, CPhT Oncology Pharmacy Patient Advocate  [facility]  [phone] (phone) [phone] (fax) 04/09/2023 3:27 PM

## 2023-04-10 ENCOUNTER — Ambulatory Visit: Payer: Medicare HMO | Admitting: Nurse Practitioner

## 2023-04-10 ENCOUNTER — Other Ambulatory Visit: Payer: Self-pay | Admitting: Pharmacist

## 2023-04-10 ENCOUNTER — Other Ambulatory Visit: Payer: Medicare HMO

## 2023-04-10 ENCOUNTER — Ambulatory Visit: Payer: Medicare HMO

## 2023-04-10 MED ORDER — DEXAMETHASONE 0.5 MG/5ML PO SOLN
1.0000 mg | Freq: Four times a day (QID) | ORAL | 4 refills | Status: DC
Start: 2023-04-10 — End: 2023-08-15

## 2023-04-10 NOTE — Telephone Encounter (Signed)
Clinical Pharmacist Practitioner Encounter   Patient Education I spoke with patient for overview of new oral chemotherapy medication: Afinitor (everolimus) for the treatment of metastatic endometrial cancer in conjunction with letrozole, planned duration until disease progression or unacceptable drug toxicity.   Counseled patient on administration, dosing, side effects, monitoring, drug-food interactions, safe handling, storage, and disposal. Patient will take 1 tablet (5 mg total) by mouth daily.   To help prevent mouth sores, patient will also use dexamethasone solution 10 mLs (1 mg total) by mouth 4 (four) times daily. Swish for 2 minutes, then spit.   Side effects include but not limited to: mouth sores, fatigue, diarrhea, rash, nausea.    Reviewed with patient importance of keeping a medication schedule and plan for any missed doses.  After discussion with patient no patient barriers to medication adherence identified.   Ms. Schenkel voiced understanding and appreciation. All questions answered. Medication handout provided.  Provided patient with Oral Chemotherapy Navigation Clinic phone number. Patient knows to call the office with questions or concerns. Oral Chemotherapy Navigation Clinic will continue to follow.  Remi Haggard, PharmD, BCPS, BCOP, CPP Hematology/Oncology Clinical Pharmacist Practitioner [place] [phone]  04/10/2023 11:30 AM

## 2023-04-17 ENCOUNTER — Other Ambulatory Visit: Payer: Self-pay | Admitting: Nurse Practitioner

## 2023-04-17 ENCOUNTER — Encounter: Payer: Self-pay | Admitting: Nurse Practitioner

## 2023-04-17 ENCOUNTER — Inpatient Hospital Stay: Payer: Medicare HMO

## 2023-04-17 ENCOUNTER — Inpatient Hospital Stay: Payer: Medicare HMO | Attending: Hematology and Oncology | Admitting: Nurse Practitioner

## 2023-04-17 VITALS — BP 142/86 | HR 64 | Temp 97.7°F | Resp 15 | Ht 62.0 in | Wt 172.5 lb

## 2023-04-17 DIAGNOSIS — C55 Malignant neoplasm of uterus, part unspecified: Secondary | ICD-10-CM | POA: Diagnosis not present

## 2023-04-17 DIAGNOSIS — C541 Malignant neoplasm of endometrium: Secondary | ICD-10-CM | POA: Diagnosis not present

## 2023-04-17 DIAGNOSIS — E032 Hypothyroidism due to medicaments and other exogenous substances: Secondary | ICD-10-CM | POA: Diagnosis not present

## 2023-04-17 DIAGNOSIS — C774 Secondary and unspecified malignant neoplasm of inguinal and lower limb lymph nodes: Secondary | ICD-10-CM | POA: Diagnosis not present

## 2023-04-17 DIAGNOSIS — Z170 Estrogen receptor positive status [ER+]: Secondary | ICD-10-CM | POA: Insufficient documentation

## 2023-04-17 DIAGNOSIS — Z79811 Long term (current) use of aromatase inhibitors: Secondary | ICD-10-CM | POA: Diagnosis not present

## 2023-04-17 DIAGNOSIS — Z79899 Other long term (current) drug therapy: Secondary | ICD-10-CM | POA: Diagnosis not present

## 2023-04-17 LAB — CBC WITH DIFFERENTIAL (CANCER CENTER ONLY)
Abs Immature Granulocytes: 0.01 10*3/uL (ref 0.00–0.07)
Basophils Absolute: 0 10*3/uL (ref 0.0–0.1)
Basophils Relative: 0 %
Eosinophils Absolute: 0.2 10*3/uL (ref 0.0–0.5)
Eosinophils Relative: 4 %
HCT: 43.8 % (ref 36.0–46.0)
Hemoglobin: 14.7 g/dL (ref 12.0–15.0)
Immature Granulocytes: 0 %
Lymphocytes Relative: 25 %
Lymphs Abs: 1.2 10*3/uL (ref 0.7–4.0)
MCH: 31.6 pg (ref 26.0–34.0)
MCHC: 33.6 g/dL (ref 30.0–36.0)
MCV: 94.2 fL (ref 80.0–100.0)
Monocytes Absolute: 0.4 10*3/uL (ref 0.1–1.0)
Monocytes Relative: 8 %
Neutro Abs: 3 10*3/uL (ref 1.7–7.7)
Neutrophils Relative %: 63 %
Platelet Count: 250 10*3/uL (ref 150–400)
RBC: 4.65 MIL/uL (ref 3.87–5.11)
RDW: 15.4 % (ref 11.5–15.5)
WBC Count: 4.9 10*3/uL (ref 4.0–10.5)
nRBC: 0 % (ref 0.0–0.2)

## 2023-04-17 LAB — CMP (CANCER CENTER ONLY)
ALT: 11 U/L (ref 0–44)
AST: 18 U/L (ref 15–41)
Albumin: 4 g/dL (ref 3.5–5.0)
Alkaline Phosphatase: 61 U/L (ref 38–126)
Anion gap: 8 (ref 5–15)
BUN: 9 mg/dL (ref 8–23)
CO2: 28 mmol/L (ref 22–32)
Calcium: 9 mg/dL (ref 8.9–10.3)
Chloride: 99 mmol/L (ref 98–111)
Creatinine: 0.87 mg/dL (ref 0.44–1.00)
GFR, Estimated: >60 mL/min (ref >60)
Glucose, Bld: 81 mg/dL (ref 70–99)
Potassium: 4.3 mmol/L (ref 3.5–5.1)
Sodium: 135 mmol/L (ref 135–145)
Total Bilirubin: 0.6 mg/dL (ref 0.3–1.2)
Total Protein: 6.6 g/dL (ref 6.5–8.1)

## 2023-04-17 LAB — TSH: TSH: 148.256 u[IU]/mL — ABNORMAL HIGH (ref 0.350–4.500)

## 2023-04-17 MED ORDER — EVEROLIMUS 5 MG PO TABS: 5.0000 mg | ORAL_TABLET | Freq: Every day | ORAL | 0 refills | Status: DC

## 2023-04-17 NOTE — Progress Notes (Signed)
[facility] OFFICE PROGRESS NOTE   Diagnosis: Endometrial cancer  INTERVAL HISTORY:   [name] returns as scheduled.  She has not started Afinitor or letrozole.  She anticipates receiving Afinitor in the next few days and will start both at that time.  Mouth sores have healed.  Hands and feet feel better though she continues to note "burning" of the feet at nighttime.  Left leg remains swollen.  Mass in the right groin continues to have drainage.  No diarrhea.  No nausea or vomiting.  Overall good appetite.  She resumed Synthroid per Dr. Timothy Lasso.  No recurrence of headaches.  Objective:  Vital signs in last 24 hours:  Blood pressure (!) 142/86, pulse 64, temperature 97.7 F (36.5 C), temperature source Oral, resp. rate 15, height 5\' 2"  (1.575 m), weight 172 lb 8 oz (78.2 kg), SpO2 100%.    HEENT: Healing ulceration left posterior upper buccal mucosa.  No thrush. Lymphatics: 4 to 5 cm mass right inguinal region. Resp: Lungs clear bilaterally. Cardio: Regular rate and rhythm. GI: No hepatosplenomegaly. Vascular: Edema throughout the left leg. Neuro: Alert and oriented. Skin: Palms and right sole without erythema.  Outer edges of the right foot are dry appearing.  She had a compression sleeve on the leg and covering the foot.   Lab Results:  Lab Results  Component Value Date   WBC 4.9 04/17/2023   HGB 14.7 04/17/2023   HCT 43.8 04/17/2023   MCV 94.2 04/17/2023   PLT 250 04/17/2023   NEUTROABS 3.0 04/17/2023    Imaging:  No results found.  Medications: I have reviewed the patient's current medications.  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, status post a TAH, BSO, appendectomy, pelvic and periaortic lymphadenectomy, and wedge resection of a liver lesion Gross complete resection, tumor involve both ovaries, lymph nodes negative 6 cycles of adjuvant Taxol/carboplatin CT abdomen/pelvis 03/28/2019-enlarged  right inguinal and iliac nodes 04/10/2019-right inguinal lymph node biopsy-metastatic adenocarcinoma, ER 90%, PR 95%, PD-L1 combined positive score-1, MSS, tumor mutation burden-4 Cisplatin 05/16/2019 20-1 cycle Radiation to the lateral pelvis and 1 lymph nodes November 2020 CT 12/72/020-decrease size of right inguinal and external iliac lymph nodes, no evidence of disease progression Everolimus and letrozole 07/14/2019 - 04/06/2020 CT 10/20/2019-resolution of right iliac lymph node, decrease in size of right inguinal node CT 03/08/2020-no evidence of recurrent or metastatic disease in the abdomen or pelvis CT 05/31/2020-no evidence of recurrent or metastatic disease CT 03/11/2021-increase right inguinal lymph node, enlargement of bilateral inguinal lymph nodes, subcutaneous soft tissue nodule at the anterior right thigh Carboplatin 04/06/2021, 04/25/2021, 05/16/2021 CT 07/12/2021-mild decrease in size of bilateral inguinal lymphadenopathy, no progressive disease in the abdomen or pelvis, 0.6 cm left inguinal node was 0.8 cm previously Letrozole 08/06/2021 CT abdomen/pelvis 04/10/2022-stable right inguinal lymph node, no new site of metastatic disease CT right femur  04/10/2022 -2.2 x 1 cm anterior subcutaneous mass Letrozole continued CT right femur 07/19/2022-mild enlargement of right thigh subcutaneous mass stable right inguinal nodal mass CTs 07/19/2022-stable right inguinal lymph node, no new or progressive disease 11/20/2022-right thigh mass larger and tender, letrozole discontinued 11/28/2022-significant enlargement of anterior right thigh soft tissue mass, slightly larger right inguinal mass 12/25/2022-wide local excision of right thigh mass, metastatic endometrial adenocarcinoma, ER positive, PR positive, negative resection margins Cycle 1 pembrolizumab/lenvatinib 02/05/2023 Cycle 2 Pembrolizumab 02/27/2023, lenvatinib placed on hold due to mucositis and hypertension Cycle 3 Pembrolizumab 03/20/2023,  lenvatinib resumed at a dose of 10 mg  daily Lenvatinib discontinued 03/30/2023 due to mucositis, hand-foot symptoms Afinitor/letrozole planned   2.  Left lower extremity lymphedema, improved after therapy in the lymphedema clinic   3.  Chronic diarrhea controlled with Lomotil    Disposition: Dana [name] appears stable.  Mucositis and hand-foot syndrome both improved.  She has not received Afinitor.  We will follow-up on this today.  Plan is to begin Afinitor plus letrozole once available.  CBC reviewed.  Counts are stable.  We will forward the thyroid test results to Dr. Timothy Lasso once resulted.  She will return for lab and follow-up in 2 weeks.  We are available to see her sooner if needed.   Lonna Cobb ANP/GNP-BC   04/17/2023  9:50 AM

## 2023-04-18 ENCOUNTER — Telehealth: Payer: Self-pay

## 2023-04-18 LAB — T4: T4, Total: 3.1 ug/dL — ABNORMAL LOW (ref 4.5–12.0)

## 2023-04-18 NOTE — Telephone Encounter (Signed)
I have faxed the patient's lab results to [phone].

## 2023-04-18 NOTE — Telephone Encounter (Signed)
-----   Message from Lonna Cobb sent at 04/18/2023  7:52 AM EDT ----- Please send TSH and T4 to PCP

## 2023-04-18 NOTE — Telephone Encounter (Signed)
Faxed over the TSH and T4

## 2023-04-18 NOTE — Telephone Encounter (Signed)
  The patient called to inform us that she will not be receiving her medication, Afinitor, until next week. I spoke with Briana from [employer], who confirmed that the patient's medication is currently being processed, and someone will be contacting her within the next 24 to 48 hours.

## 2023-04-23 ENCOUNTER — Other Ambulatory Visit: Payer: Self-pay | Admitting: *Deleted

## 2023-04-23 ENCOUNTER — Telehealth: Payer: Self-pay | Admitting: *Deleted

## 2023-04-23 DIAGNOSIS — R2241 Localized swelling, mass and lump, right lower limb: Secondary | ICD-10-CM | POA: Diagnosis not present

## 2023-04-23 DIAGNOSIS — C55 Malignant neoplasm of uterus, part unspecified: Secondary | ICD-10-CM

## 2023-04-23 DIAGNOSIS — I890 Lymphedema, not elsewhere classified: Secondary | ICD-10-CM | POA: Diagnosis not present

## 2023-04-23 NOTE — Telephone Encounter (Signed)
Dana [name] reports she only started her Afinitor and Letrozole on 10/05. 2 week f/u will be due week if 10/21. Forwarded her to scheduler to move her appointment out 1 week.

## 2023-04-30 ENCOUNTER — Ambulatory Visit: Payer: Medicare HMO | Admitting: Oncology

## 2023-04-30 ENCOUNTER — Other Ambulatory Visit: Payer: Medicare HMO

## 2023-05-01 ENCOUNTER — Inpatient Hospital Stay: Payer: Medicare HMO | Admitting: Oncology

## 2023-05-01 ENCOUNTER — Inpatient Hospital Stay: Payer: Medicare HMO

## 2023-05-11 ENCOUNTER — Inpatient Hospital Stay: Payer: Medicare HMO

## 2023-05-11 ENCOUNTER — Telehealth: Payer: Self-pay

## 2023-05-11 ENCOUNTER — Encounter: Payer: Self-pay | Admitting: Nurse Practitioner

## 2023-05-11 ENCOUNTER — Inpatient Hospital Stay: Payer: Medicare HMO | Admitting: Nurse Practitioner

## 2023-05-11 VITALS — BP 123/82 | HR 75 | Temp 98.2°F | Resp 16 | Wt 172.6 lb

## 2023-05-11 DIAGNOSIS — C55 Malignant neoplasm of uterus, part unspecified: Secondary | ICD-10-CM

## 2023-05-11 DIAGNOSIS — E032 Hypothyroidism due to medicaments and other exogenous substances: Secondary | ICD-10-CM

## 2023-05-11 DIAGNOSIS — Z79899 Other long term (current) drug therapy: Secondary | ICD-10-CM | POA: Diagnosis not present

## 2023-05-11 DIAGNOSIS — Z170 Estrogen receptor positive status [ER+]: Secondary | ICD-10-CM | POA: Diagnosis not present

## 2023-05-11 DIAGNOSIS — Z79811 Long term (current) use of aromatase inhibitors: Secondary | ICD-10-CM | POA: Diagnosis not present

## 2023-05-11 DIAGNOSIS — C774 Secondary and unspecified malignant neoplasm of inguinal and lower limb lymph nodes: Secondary | ICD-10-CM | POA: Diagnosis not present

## 2023-05-11 DIAGNOSIS — C541 Malignant neoplasm of endometrium: Secondary | ICD-10-CM | POA: Diagnosis not present

## 2023-05-11 LAB — CMP (CANCER CENTER ONLY)
ALT: 9 U/L (ref 0–44)
AST: 17 U/L (ref 15–41)
Albumin: 4.1 g/dL (ref 3.5–5.0)
Alkaline Phosphatase: 66 U/L (ref 38–126)
Anion gap: 5 (ref 5–15)
BUN: 10 mg/dL (ref 8–23)
CO2: 28 mmol/L (ref 22–32)
Calcium: 8.9 mg/dL (ref 8.9–10.3)
Chloride: 102 mmol/L (ref 98–111)
Creatinine: 0.8 mg/dL (ref 0.44–1.00)
GFR, Estimated: >60 mL/min (ref >60)
Glucose, Bld: 66 mg/dL — ABNORMAL LOW (ref 70–99)
Potassium: 3.9 mmol/L (ref 3.5–5.1)
Sodium: 135 mmol/L (ref 135–145)
Total Bilirubin: 0.6 mg/dL (ref 0.3–1.2)
Total Protein: 6 g/dL — ABNORMAL LOW (ref 6.5–8.1)

## 2023-05-11 LAB — CBC WITH DIFFERENTIAL (CANCER CENTER ONLY)
Abs Immature Granulocytes: 0 10*3/uL (ref 0.00–0.07)
Basophils Absolute: 0.1 10*3/uL (ref 0.0–0.1)
Basophils Relative: 1 %
Eosinophils Absolute: 0.3 10*3/uL (ref 0.0–0.5)
Eosinophils Relative: 8 %
HCT: 39.9 % (ref 36.0–46.0)
Hemoglobin: 13.5 g/dL (ref 12.0–15.0)
Immature Granulocytes: 0 %
Lymphocytes Relative: 31 %
Lymphs Abs: 1.1 10*3/uL (ref 0.7–4.0)
MCH: 31.7 pg (ref 26.0–34.0)
MCHC: 33.8 g/dL (ref 30.0–36.0)
MCV: 93.7 fL (ref 80.0–100.0)
Monocytes Absolute: 0.4 10*3/uL (ref 0.1–1.0)
Monocytes Relative: 12 %
Neutro Abs: 1.7 10*3/uL (ref 1.7–7.7)
Neutrophils Relative %: 48 %
Platelet Count: 210 10*3/uL (ref 150–400)
RBC: 4.26 MIL/uL (ref 3.87–5.11)
RDW: 14.2 % (ref 11.5–15.5)
WBC Count: 3.6 10*3/uL — ABNORMAL LOW (ref 4.0–10.5)
nRBC: 0 % (ref 0.0–0.2)

## 2023-05-11 LAB — TOTAL PROTEIN, URINE DIPSTICK: Protein, ur: NEGATIVE mg/dL

## 2023-05-11 NOTE — Progress Notes (Signed)
[facility] OFFICE PROGRESS NOTE   Diagnosis: Endometrial cancer  INTERVAL HISTORY:   Dana [name] returns as scheduled.  She began Afinitor and letrozole on 04/21/2023.  No significant nausea.  No diarrhea.  She thinks she is developing a sore at the left lower outer lip.  No sores in the mouth.  Hands and feet feel better, no pain or redness.  No rash.  No arthralgias.  No hot flashes.  She notes increased swelling of the left leg.  She recently has been coughing at nighttime.  When she coughs she feels mildly short of breath.  No fever.  She does not cough during the day.  Objective:  Vital signs in last 24 hours:  Blood pressure 123/82, pulse 75, temperature 98.2 F (36.8 C), temperature source Temporal, resp. rate 16, weight 172 lb 9.6 oz (78.3 kg), SpO2 99%.    HEENT: No thrush or ulcers.  Area of erythema at the right posterior palate.  Left lower outer lip with possible developing sore. Lymphatics: Approximate 4 cm mass right inguinal region. Resp: Lungs clear bilaterally.  No respiratory distress. Cardio: Regular rate and rhythm. GI: No hepatosplenomegaly. Vascular: Edema throughout the left leg.  Trace edema at the right lower leg. Skin: Palms without erythema.  Soles without erythema, marked dryness.   Lab Results:  Lab Results  Component Value Date   WBC 3.6 (L) 05/11/2023   HGB 13.5 05/11/2023   HCT 39.9 05/11/2023   MCV 93.7 05/11/2023   PLT 210 05/11/2023   NEUTROABS 1.7 05/11/2023    Imaging:  No results found.  Medications: I have reviewed the patient's current medications.  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, status post a TAH, BSO, appendectomy, pelvic and periaortic lymphadenectomy, and wedge resection of a liver lesion Gross complete resection, tumor involve both ovaries, lymph nodes negative 6 cycles of adjuvant Taxol/carboplatin CT abdomen/pelvis 03/28/2019-enlarged right inguinal  and iliac nodes 04/10/2019-right inguinal lymph node biopsy-metastatic adenocarcinoma, ER 90%, PR 95%, PD-L1 combined positive score-1, MSS, tumor mutation burden-4 Cisplatin 05/16/2019 20-1 cycle Radiation to the lateral pelvis and 1 lymph nodes November 2020 CT 12/72/020-decrease size of right inguinal and external iliac lymph nodes, no evidence of disease progression Everolimus and letrozole 07/14/2019 - 04/06/2020 CT 10/20/2019-resolution of right iliac lymph node, decrease in size of right inguinal node CT 03/08/2020-no evidence of recurrent or metastatic disease in the abdomen or pelvis CT 05/31/2020-no evidence of recurrent or metastatic disease CT 03/11/2021-increase right inguinal lymph node, enlargement of bilateral inguinal lymph nodes, subcutaneous soft tissue nodule at the anterior right thigh Carboplatin 04/06/2021, 04/25/2021, 05/16/2021 CT 07/12/2021-mild decrease in size of bilateral inguinal lymphadenopathy, no progressive disease in the abdomen or pelvis, 0.6 cm left inguinal node was 0.8 cm previously Letrozole 08/06/2021 CT abdomen/pelvis 04/10/2022-stable right inguinal lymph node, no new site of metastatic disease CT right femur  04/10/2022 -2.2 x 1 cm anterior subcutaneous mass Letrozole continued CT right femur 07/19/2022-mild enlargement of right thigh subcutaneous mass stable right inguinal nodal mass CTs 07/19/2022-stable right inguinal lymph node, no new or progressive disease 11/20/2022-right thigh mass larger and tender, letrozole discontinued 11/28/2022-significant enlargement of anterior right thigh soft tissue mass, slightly larger right inguinal mass 12/25/2022-wide local excision of right thigh mass, metastatic endometrial adenocarcinoma, ER positive, PR positive, negative resection margins Cycle 1 pembrolizumab/lenvatinib 02/05/2023 Cycle 2 Pembrolizumab 02/27/2023, lenvatinib placed on hold due to mucositis and hypertension Cycle 3 Pembrolizumab 03/20/2023, lenvatinib resumed  at a dose of 10  mg daily Lenvatinib discontinued 03/30/2023 due to mucositis, hand-foot symptoms Afinitor/letrozole 04/21/2023   2.  Left lower extremity lymphedema, improved after therapy in the lymphedema clinic   3.  Chronic diarrhea controlled with Lomotil  Disposition: Dana [name] appears stable.  She began Afinitor/letrozole 04/21/2023.  Overall she is tolerating well.  Plan to continue the same.  She understands to contact the office with mouth sores, diarrhea, other worrisome symptoms.  She has developed a cough at nighttime.  Continue to monitor for now.  She understands to contact the office with worsening of the cough, shortness of breath, fever.  She will return for lab and follow-up in 3 weeks.  We are available to see her sooner if needed.    Lonna Cobb ANP/GNP-BC   05/11/2023  8:43 AM

## 2023-05-11 NOTE — Telephone Encounter (Signed)
-----   Message from Lonna Cobb sent at 05/11/2023 11:10 AM EDT ----- Please let her know the chemistry panel looks good except glucose decreased at 66.

## 2023-05-11 NOTE — Telephone Encounter (Signed)
Patient gave verbal understanding and had no further question or concerns 

## 2023-05-11 NOTE — Progress Notes (Signed)
[facility] Clinical Social Work  Clinical Social Work was referred by Merla Riches,  Clinic Patient Financial Counsel for assessment of psychosocial needs.  Clinical Social Worker contacted patient by phone to offer support and assess for needs.    Patient expressed concerns regarding being charged for [facility].  Securely emailed patient Database administrator.  Patient expressed no other needs at this time.     Dorothey Baseman, LCSW  Clinical Social Worker [facility]

## 2023-05-16 ENCOUNTER — Other Ambulatory Visit: Payer: Self-pay | Admitting: *Deleted

## 2023-05-16 DIAGNOSIS — C55 Malignant neoplasm of uterus, part unspecified: Secondary | ICD-10-CM

## 2023-05-16 MED ORDER — EVEROLIMUS 5 MG PO TABS: 5.0000 mg | ORAL_TABLET | Freq: Every day | ORAL | 2 refills | Status: DC

## 2023-05-16 NOTE — Telephone Encounter (Signed)
Called to request refill on her everolimus. Refill approved.

## 2023-05-18 ENCOUNTER — Telehealth: Payer: Self-pay | Admitting: *Deleted

## 2023-05-18 NOTE — Telephone Encounter (Signed)
Patient called to report she has not heard from pharmacy about her Afinitor and she will be out after the weekend.  Called pharmacy and confirmed they received the refill on 05/16/23. It can take 72 hours to process and she will be called. Dana [name] made aware and reassured her that if she misses a few doses it will be OK.

## 2023-06-01 ENCOUNTER — Inpatient Hospital Stay: Payer: Medicare HMO | Attending: Hematology and Oncology

## 2023-06-01 ENCOUNTER — Encounter: Payer: Self-pay | Admitting: Rehabilitation

## 2023-06-01 ENCOUNTER — Inpatient Hospital Stay: Payer: Medicare HMO | Admitting: Nurse Practitioner

## 2023-06-01 ENCOUNTER — Encounter: Payer: Self-pay | Admitting: Nurse Practitioner

## 2023-06-01 ENCOUNTER — Ambulatory Visit (HOSPITAL_BASED_OUTPATIENT_CLINIC_OR_DEPARTMENT_OTHER)
Admission: RE | Admit: 2023-06-01 | Discharge: 2023-06-01 | Disposition: A | Discharge status: Home / Self Care | Payer: Medicare HMO | Source: Ambulatory Visit | Attending: Nurse Practitioner | Admitting: Nurse Practitioner

## 2023-06-01 VITALS — BP 128/81 | HR 68 | Temp 98.1°F | Resp 18 | Ht 62.0 in | Wt 173.3 lb

## 2023-06-01 DIAGNOSIS — R059 Cough, unspecified: Secondary | ICD-10-CM | POA: Insufficient documentation

## 2023-06-01 DIAGNOSIS — E032 Hypothyroidism due to medicaments and other exogenous substances: Secondary | ICD-10-CM

## 2023-06-01 DIAGNOSIS — C55 Malignant neoplasm of uterus, part unspecified: Secondary | ICD-10-CM | POA: Insufficient documentation

## 2023-06-01 DIAGNOSIS — C541 Malignant neoplasm of endometrium: Secondary | ICD-10-CM | POA: Diagnosis not present

## 2023-06-01 DIAGNOSIS — R0602 Shortness of breath: Secondary | ICD-10-CM | POA: Diagnosis not present

## 2023-06-01 LAB — CMP (CANCER CENTER ONLY)
ALT: 9 U/L (ref 0–44)
AST: 17 U/L (ref 15–41)
Albumin: 3.9 g/dL (ref 3.5–5.0)
Alkaline Phosphatase: 65 U/L (ref 38–126)
Anion gap: 7 (ref 5–15)
BUN: 8 mg/dL (ref 8–23)
CO2: 27 mmol/L (ref 22–32)
Calcium: 9 mg/dL (ref 8.9–10.3)
Chloride: 99 mmol/L (ref 98–111)
Creatinine: 0.78 mg/dL (ref 0.44–1.00)
GFR, Estimated: >60 mL/min (ref >60)
Glucose, Bld: 85 mg/dL (ref 70–99)
Potassium: 4.1 mmol/L (ref 3.5–5.1)
Sodium: 133 mmol/L — ABNORMAL LOW (ref 135–145)
Total Bilirubin: 0.5 mg/dL (ref <1.2)
Total Protein: 6.5 g/dL (ref 6.5–8.1)

## 2023-06-01 LAB — CBC WITH DIFFERENTIAL (CANCER CENTER ONLY)
Abs Immature Granulocytes: 0.01 10*3/uL (ref 0.00–0.07)
Basophils Absolute: 0 10*3/uL (ref 0.0–0.1)
Basophils Relative: 1 %
Eosinophils Absolute: 0.3 10*3/uL (ref 0.0–0.5)
Eosinophils Relative: 8 %
HCT: 39.3 % (ref 36.0–46.0)
Hemoglobin: 13.2 g/dL (ref 12.0–15.0)
Immature Granulocytes: 0 %
Lymphocytes Relative: 26 %
Lymphs Abs: 1.1 10*3/uL (ref 0.7–4.0)
MCH: 31.1 pg (ref 26.0–34.0)
MCHC: 33.6 g/dL (ref 30.0–36.0)
MCV: 92.7 fL (ref 80.0–100.0)
Monocytes Absolute: 0.4 10*3/uL (ref 0.1–1.0)
Monocytes Relative: 9 %
Neutro Abs: 2.3 10*3/uL (ref 1.7–7.7)
Neutrophils Relative %: 56 %
Platelet Count: 227 10*3/uL (ref 150–400)
RBC: 4.24 MIL/uL (ref 3.87–5.11)
RDW: 13.6 % (ref 11.5–15.5)
WBC Count: 4.1 10*3/uL (ref 4.0–10.5)
nRBC: 0 % (ref 0.0–0.2)

## 2023-06-01 NOTE — Progress Notes (Addendum)
[facility] OFFICE PROGRESS NOTE   Diagnosis: Endometrial cancer  INTERVAL HISTORY:   Dana Dana [name] returns as scheduled.  She continues Afinitor and letrozole.  She denies nausea.  No mouth sores.  No diarrhea.  No hand or foot pain or redness.  She thinks the groin mass is smaller.  She reports a persistent cough, worse during the night.  The cough improves during the day.  No fever.  Sometimes during the night she feels short of breath.  Objective:  Vital signs in last 24 hours:  Blood pressure 128/81, pulse 68, temperature 98.1 F (36.7 C), temperature source Oral, resp. rate 18, height 5\' 2"  (1.575 m), weight 173 lb 4.8 oz (78.6 kg), SpO2 99%.    HEENT: No thrush or ulcers. Lymphatics: Right inguinal mass appears smaller, approximately 3 cm. Resp: Lungs clear bilaterally. Cardio: Regular rate and rhythm. GI: No hepatosplenomegaly. Vascular: Edema throughout the left leg. Skin: Palms without erythema.  Dana [name] without erythema, dry appearing over the heels.   Lab Results:  Lab Results  Component Value Date   WBC 4.1 06/01/2023   HGB 13.2 06/01/2023   HCT 39.3 06/01/2023   MCV 92.7 06/01/2023   PLT 227 06/01/2023   NEUTROABS 2.3 06/01/2023    Imaging:  No results found.  Medications: I have reviewed the patient's current medications.  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, status post a TAH, BSO, appendectomy, pelvic and periaortic lymphadenectomy, and wedge resection of a liver lesion Gross complete resection, tumor involve both ovaries, lymph nodes negative 6 cycles of adjuvant Taxol/carboplatin CT abdomen/pelvis 03/28/2019-enlarged right inguinal and iliac nodes 04/10/2019-right inguinal lymph node biopsy-metastatic adenocarcinoma, ER 90%, PR 95%, PD-L1 combined positive score-1, MSS, tumor mutation burden-4 Cisplatin 05/16/2019 20-1 cycle Radiation to the lateral pelvis and 1 lymph nodes November  2020 CT 12/72/020-decrease size of right inguinal and external iliac lymph nodes, no evidence of disease progression Everolimus and letrozole 07/14/2019 - 04/06/2020 CT 10/20/2019-resolution of right iliac lymph node, decrease in size of right inguinal node CT 03/08/2020-no evidence of recurrent or metastatic disease in the abdomen or pelvis CT 05/31/2020-no evidence of recurrent or metastatic disease CT 03/11/2021-increase right inguinal lymph node, enlargement of bilateral inguinal lymph nodes, subcutaneous soft tissue nodule at the anterior right thigh Carboplatin 04/06/2021, 04/25/2021, 05/16/2021 CT 07/12/2021-mild decrease in size of bilateral inguinal lymphadenopathy, no progressive disease in the abdomen or pelvis, 0.6 cm left inguinal node was 0.8 cm previously Letrozole 08/06/2021 CT abdomen/pelvis 04/10/2022-stable right inguinal lymph node, no new site of metastatic disease CT right femur  04/10/2022 -2.2 x 1 cm anterior subcutaneous mass Letrozole continued CT right femur 07/19/2022-mild enlargement of right thigh subcutaneous mass stable right inguinal nodal mass CTs 07/19/2022-stable right inguinal lymph node, no new or progressive disease 11/20/2022-right thigh mass larger and tender, letrozole discontinued 11/28/2022-significant enlargement of anterior right thigh soft tissue mass, slightly larger right inguinal mass 12/25/2022-wide local excision of right thigh mass, metastatic endometrial adenocarcinoma, ER positive, PR positive, negative resection margins Cycle 1 pembrolizumab/lenvatinib 02/05/2023 Cycle 2 Pembrolizumab 02/27/2023, lenvatinib placed on hold due to mucositis and hypertension Cycle 3 Pembrolizumab 03/20/2023, lenvatinib resumed at a dose of 10 mg daily Lenvatinib discontinued 03/30/2023 due to mucositis, hand-foot symptoms Afinitor/letrozole 04/21/2023   2.  Left lower extremity lymphedema, improved after therapy in the lymphedema clinic   3.  Chronic diarrhea controlled with  Lomotil    Disposition: Dana Dana [name] appears stable.  She has now been on Afinitor/letrozole about  6 weeks.  The right inguinal mass appears smaller.  Plan to continue the same.  CBC and chemistry panel reviewed.  Labs adequate to continue Afinitor/letrozole as above.  She has a persistent cough, mainly at nighttime.  Low clinical suspicion for pneumonitis.  Plan for chest x-ray today.  She understands to contact the office with worsening symptoms.  She will return for follow-up as scheduled in 3 weeks.  We are available to see her sooner if needed.  Patient seen with Dr. Truett [name].  Lonna Cobb ANP/GNP-BC   06/01/2023  9:30 AM  This was a shared visit with Lonna Cobb.  Dana Dana [name] was interviewed and examined.  She appears to be tolerating the Afinitor and letrozole well.  The right groin mass is smaller.  We will refer her for a chest x-ray to evaluate the cough.  I was present for greater than 50% of today's visit.  I performed Medical Decision Making.  Mancel Bale, MD  Addendum 4:46 PM-the chest x-ray was unremarkable.  I contacted Dana Dana [name] with this information.  She will try over-the-counter Robitussin.

## 2023-06-02 ENCOUNTER — Other Ambulatory Visit: Payer: Self-pay | Admitting: Nurse Practitioner

## 2023-06-02 DIAGNOSIS — C55 Malignant neoplasm of uterus, part unspecified: Secondary | ICD-10-CM

## 2023-06-20 ENCOUNTER — Inpatient Hospital Stay: Payer: Medicare HMO | Admitting: Oncology

## 2023-06-20 ENCOUNTER — Inpatient Hospital Stay: Payer: Medicare HMO

## 2023-06-20 ENCOUNTER — Inpatient Hospital Stay: Payer: Medicare HMO | Attending: Hematology and Oncology | Admitting: Oncology

## 2023-06-20 VITALS — BP 124/80 | HR 96 | Temp 98.2°F | Resp 18 | Ht 62.0 in | Wt 170.1 lb

## 2023-06-20 DIAGNOSIS — R109 Unspecified abdominal pain: Secondary | ICD-10-CM | POA: Diagnosis not present

## 2023-06-20 DIAGNOSIS — C55 Malignant neoplasm of uterus, part unspecified: Secondary | ICD-10-CM

## 2023-06-20 DIAGNOSIS — R1909 Other intra-abdominal and pelvic swelling, mass and lump: Secondary | ICD-10-CM | POA: Diagnosis not present

## 2023-06-20 DIAGNOSIS — Z79811 Long term (current) use of aromatase inhibitors: Secondary | ICD-10-CM | POA: Insufficient documentation

## 2023-06-20 DIAGNOSIS — E032 Hypothyroidism due to medicaments and other exogenous substances: Secondary | ICD-10-CM

## 2023-06-20 DIAGNOSIS — R110 Nausea: Secondary | ICD-10-CM | POA: Diagnosis not present

## 2023-06-20 DIAGNOSIS — C541 Malignant neoplasm of endometrium: Secondary | ICD-10-CM | POA: Insufficient documentation

## 2023-06-20 LAB — CBC WITH DIFFERENTIAL (CANCER CENTER ONLY)
Abs Immature Granulocytes: 0.01 10*3/uL (ref 0.00–0.07)
Basophils Absolute: 0 10*3/uL (ref 0.0–0.1)
Basophils Relative: 1 %
Eosinophils Absolute: 0.2 10*3/uL (ref 0.0–0.5)
Eosinophils Relative: 4 %
HCT: 39.1 % (ref 36.0–46.0)
Hemoglobin: 13.3 g/dL (ref 12.0–15.0)
Immature Granulocytes: 0 %
Lymphocytes Relative: 21 %
Lymphs Abs: 1.1 10*3/uL (ref 0.7–4.0)
MCH: 31.9 pg (ref 26.0–34.0)
MCHC: 34 g/dL (ref 30.0–36.0)
MCV: 93.8 fL (ref 80.0–100.0)
Monocytes Absolute: 0.4 10*3/uL (ref 0.1–1.0)
Monocytes Relative: 9 %
Neutro Abs: 3.3 10*3/uL (ref 1.7–7.7)
Neutrophils Relative %: 65 %
Platelet Count: 285 10*3/uL (ref 150–400)
RBC: 4.17 MIL/uL (ref 3.87–5.11)
RDW: 13.4 % (ref 11.5–15.5)
WBC Count: 5.1 10*3/uL (ref 4.0–10.5)
nRBC: 0 % (ref 0.0–0.2)

## 2023-06-20 LAB — CMP (CANCER CENTER ONLY)
ALT: 8 U/L (ref 0–44)
AST: 13 U/L — ABNORMAL LOW (ref 15–41)
Albumin: 3.9 g/dL (ref 3.5–5.0)
Alkaline Phosphatase: 59 U/L (ref 38–126)
Anion gap: 7 (ref 5–15)
BUN: 7 mg/dL — ABNORMAL LOW (ref 8–23)
CO2: 28 mmol/L (ref 22–32)
Calcium: 9.1 mg/dL (ref 8.9–10.3)
Chloride: 102 mmol/L (ref 98–111)
Creatinine: 0.88 mg/dL (ref 0.44–1.00)
GFR, Estimated: >60 mL/min (ref >60)
Glucose, Bld: 94 mg/dL (ref 70–99)
Potassium: 4.3 mmol/L (ref 3.5–5.1)
Sodium: 137 mmol/L (ref 135–145)
Total Bilirubin: 0.6 mg/dL (ref <1.2)
Total Protein: 6.3 g/dL — ABNORMAL LOW (ref 6.5–8.1)

## 2023-06-20 NOTE — Progress Notes (Signed)
[facility] OFFICE PROGRESS NOTE   Diagnosis: Endometrial cancer.  INTERVAL HISTORY:   Ms. Merisier returns as scheduled.  She continues everolimus and letrozole.  No diarrhea or rash.  Her dentist noted "mouth sores "yesterday.  She has not appreciated this.  She reports intermittent episodes of abdominal pain followed by nausea and vomiting for years.  This had not occurred recently until Thanksgiving day when she developed acute abdominal pain and vomiting.  This lasted for 2 days.  She reports these episodes generally happen after eating a large meal.  She is having bowel movements. The right groin lesion is smaller.  Objective:  Vital signs in last 24 hours:  Blood pressure 124/80, pulse 96, temperature 98.2 F (36.8 C), temperature source Temporal, resp. rate 18, height 5\' 2"  (1.575 m), weight 170 lb 1.6 oz (77.2 kg), SpO2 98%.    HEENT: No thrush or ulcers Resp: Lungs clear bilaterally Cardio: Regular rate and rhythm GI: Soft , no mass, mild diffuse tenderness, small reducible hernia at the upper midline wound, no hepatosplenomegaly.  Firm mass at the right inguinal region with white drainage.  There is a firm component measuring approximately 2 cm superior to the visible mass. Vascular: Left leg edema   Lab Results:  Lab Results  Component Value Date   WBC 5.1 06/20/2023   HGB 13.3 06/20/2023   HCT 39.1 06/20/2023   MCV 93.8 06/20/2023   PLT 285 06/20/2023   NEUTROABS 3.3 06/20/2023    CMP  Lab Results  Component Value Date   NA 137 06/20/2023   K 4.3 06/20/2023   CL 102 06/20/2023   CO2 28 06/20/2023   GLUCOSE 94 06/20/2023   BUN 7 (L) 06/20/2023   CREATININE 0.88 06/20/2023   CALCIUM 9.1 06/20/2023   PROT 6.3 (L) 06/20/2023   ALBUMIN 3.9 06/20/2023   AST 13 (L) 06/20/2023   ALT 8 06/20/2023   ALKPHOS 59 06/20/2023   BILITOT 0.6 06/20/2023   GFRNONAA >60 06/20/2023   GFRAA >60 04/06/2020    No results found for: "CEA1", "CEA", "JXB147",  "CA125"  Lab Results  Component Value Date   INR 0.9 05/15/2019   LABPROT 11.6 05/15/2019    Imaging:  No results found.  Medications: I have reviewed the patient's current medications.   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, status post a TAH, BSO, appendectomy, pelvic and periaortic lymphadenectomy, and wedge resection of a liver lesion Gross complete resection, tumor involve both ovaries, lymph nodes negative 6 cycles of adjuvant Taxol/carboplatin CT abdomen/pelvis 03/28/2019-enlarged right inguinal and iliac nodes 04/10/2019-right inguinal lymph node biopsy-metastatic adenocarcinoma, ER 90%, PR 95%, PD-L1 combined positive score-1, MSS, tumor mutation burden-4 Cisplatin 05/16/2019 20-1 cycle Radiation to the lateral pelvis and 1 lymph nodes November 2020 CT 12/72/020-decrease size of right inguinal and external iliac lymph nodes, no evidence of disease progression Everolimus and letrozole 07/14/2019 - 04/06/2020 CT 10/20/2019-resolution of right iliac lymph node, decrease in size of right inguinal node CT 03/08/2020-no evidence of recurrent or metastatic disease in the abdomen or pelvis CT 05/31/2020-no evidence of recurrent or metastatic disease CT 03/11/2021-increase right inguinal lymph node, enlargement of bilateral inguinal lymph nodes, subcutaneous soft tissue nodule at the anterior right thigh Carboplatin 04/06/2021, 04/25/2021, 05/16/2021 CT 07/12/2021-mild decrease in size of bilateral inguinal lymphadenopathy, no progressive disease in the abdomen or pelvis, 0.6 cm left inguinal node was 0.8 cm previously Letrozole 08/06/2021 CT abdomen/pelvis 04/10/2022-stable right inguinal lymph node, no new site  of metastatic disease CT right femur  04/10/2022 -2.2 x 1 cm anterior subcutaneous mass Letrozole continued CT right femur 07/19/2022-mild enlargement of right thigh subcutaneous mass stable right inguinal nodal mass CTs  07/19/2022-stable right inguinal lymph node, no new or progressive disease 11/20/2022-right thigh mass larger and tender, letrozole discontinued 11/28/2022-significant enlargement of anterior right thigh soft tissue mass, slightly larger right inguinal mass 12/25/2022-wide local excision of right thigh mass, metastatic endometrial adenocarcinoma, ER positive, PR positive, negative resection margins Cycle 1 pembrolizumab/lenvatinib 02/05/2023 Cycle 2 Pembrolizumab 02/27/2023, lenvatinib placed on hold due to mucositis and hypertension Cycle 3 Pembrolizumab 03/20/2023, lenvatinib resumed at a dose of 10 mg daily Lenvatinib discontinued 03/30/2023 due to mucositis, hand-foot symptoms Afinitor/letrozole 04/21/2023   2.  Left lower extremity lymphedema, improved after therapy in the lymphedema clinic   3.  Chronic diarrhea controlled with Lomotil      Disposition: Ms. Crumb has metastatic endometrial cancer.  She has been maintained on everolimus/letrozole since 04/21/2023.  The right inguinal mass appears harshly improved.  She is tolerating the everolimus and letrozole well.  She will continue the current treatment.  The etiology of the intermittent episodes of abdominal pain and vomiting is unclear.  I doubt this is related to the endometrial cancer.  This could be related to intermittent bowel obstruction from adhesions or the abdominal hernias.  We will make a referral to GI or surgery if these episodes become more frequent.  She will return for an office and lab visit in 4 weeks.  Thornton Papas, MD  06/20/2023  11:12 AM

## 2023-06-29 ENCOUNTER — Other Ambulatory Visit: Payer: Self-pay | Admitting: *Deleted

## 2023-06-29 ENCOUNTER — Encounter: Payer: Self-pay | Admitting: Nurse Practitioner

## 2023-07-17 ENCOUNTER — Inpatient Hospital Stay: Payer: Medicare HMO | Admitting: Nurse Practitioner

## 2023-07-17 ENCOUNTER — Inpatient Hospital Stay: Payer: Medicare HMO

## 2023-07-17 ENCOUNTER — Encounter: Payer: Self-pay | Admitting: Nurse Practitioner

## 2023-07-17 VITALS — BP 119/76 | HR 88 | Temp 98.2°F | Resp 18 | Ht 62.0 in | Wt 173.0 lb

## 2023-07-17 DIAGNOSIS — C55 Malignant neoplasm of uterus, part unspecified: Secondary | ICD-10-CM

## 2023-07-17 DIAGNOSIS — C541 Malignant neoplasm of endometrium: Secondary | ICD-10-CM | POA: Diagnosis not present

## 2023-07-17 DIAGNOSIS — R1909 Other intra-abdominal and pelvic swelling, mass and lump: Secondary | ICD-10-CM | POA: Diagnosis not present

## 2023-07-17 DIAGNOSIS — Z79811 Long term (current) use of aromatase inhibitors: Secondary | ICD-10-CM | POA: Diagnosis not present

## 2023-07-17 DIAGNOSIS — R109 Unspecified abdominal pain: Secondary | ICD-10-CM | POA: Diagnosis not present

## 2023-07-17 DIAGNOSIS — R110 Nausea: Secondary | ICD-10-CM | POA: Diagnosis not present

## 2023-07-17 LAB — CMP (CANCER CENTER ONLY)
ALT: 8 U/L (ref 0–44)
AST: 14 U/L — ABNORMAL LOW (ref 15–41)
Albumin: 3.9 g/dL (ref 3.5–5.0)
Alkaline Phosphatase: 67 U/L (ref 38–126)
Anion gap: 7 (ref 5–15)
BUN: 12 mg/dL (ref 8–23)
CO2: 27 mmol/L (ref 22–32)
Calcium: 8.5 mg/dL — ABNORMAL LOW (ref 8.9–10.3)
Chloride: 101 mmol/L (ref 98–111)
Creatinine: 0.83 mg/dL (ref 0.44–1.00)
GFR, Estimated: >60 mL/min (ref >60)
Glucose, Bld: 119 mg/dL — ABNORMAL HIGH (ref 70–99)
Potassium: 4.2 mmol/L (ref 3.5–5.1)
Sodium: 135 mmol/L (ref 135–145)
Total Bilirubin: 0.6 mg/dL (ref 0.0–1.2)
Total Protein: 6.1 g/dL — ABNORMAL LOW (ref 6.5–8.1)

## 2023-07-17 LAB — CBC WITH DIFFERENTIAL (CANCER CENTER ONLY)
Abs Immature Granulocytes: 0.01 K/uL (ref 0.00–0.07)
Basophils Absolute: 0 K/uL (ref 0.0–0.1)
Basophils Relative: 1 %
Eosinophils Absolute: 0.3 K/uL (ref 0.0–0.5)
Eosinophils Relative: 6 %
HCT: 39.4 % (ref 36.0–46.0)
Hemoglobin: 13.1 g/dL (ref 12.0–15.0)
Immature Granulocytes: 0 %
Lymphocytes Relative: 24 %
Lymphs Abs: 1.2 K/uL (ref 0.7–4.0)
MCH: 31.7 pg (ref 26.0–34.0)
MCHC: 33.2 g/dL (ref 30.0–36.0)
MCV: 95.4 fL (ref 80.0–100.0)
Monocytes Absolute: 0.4 K/uL (ref 0.1–1.0)
Monocytes Relative: 8 %
Neutro Abs: 3.1 K/uL (ref 1.7–7.7)
Neutrophils Relative %: 61 %
Platelet Count: 249 K/uL (ref 150–400)
RBC: 4.13 MIL/uL (ref 3.87–5.11)
RDW: 14.4 % (ref 11.5–15.5)
WBC Count: 5 K/uL (ref 4.0–10.5)
nRBC: 0 % (ref 0.0–0.2)

## 2023-07-17 NOTE — Progress Notes (Signed)
 [facility] OFFICE PROGRESS NOTE   Diagnosis: Endometrial cancer  INTERVAL HISTORY:   [name] returns as scheduled.  She continues everolimus  and letrozole .  No more episodes of nausea/vomiting.  No mouth sores.  No diarrhea.  No hand or foot pain or redness.  She notes improvement in neuropathy since using a grounding mat.  The groin mass continues to ooze.  She thinks the size is unchanged.  Objective:  Vital signs in last 24 hours:  Blood pressure 119/76, pulse 88, temperature 98.2 F (36.8 C), temperature source Temporal, resp. rate 18, height 5' 2 (1.575 m), weight 173 lb (78.5 kg), SpO2 99%.    HEENT: No thrush or ulcers. Resp: Lungs clear bilaterally. Cardio: Regular rate and rhythm. GI: Abdomen soft and nontender.  No hepatosplenomegaly.  Firm mass right inguinal region, appears unchanged.  There is firmness extending superior to the visible mass as well. Vascular: Edema throughout the left leg. Skin: Soles are dry appearing.   Lab Results:  Lab Results  Component Value Date   WBC 5.0 07/17/2023   HGB 13.1 07/17/2023   HCT 39.4 07/17/2023   MCV 95.4 07/17/2023   PLT 249 07/17/2023   NEUTROABS 3.1 07/17/2023    Imaging:  No results found.  Medications: I have reviewed the patient's current medications.  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, status post a TAH, BSO, appendectomy, pelvic and periaortic lymphadenectomy, and wedge resection of a liver lesion Gross complete resection, tumor involve both ovaries, lymph nodes negative 6 cycles of adjuvant Taxol /carboplatin  CT abdomen/pelvis 03/28/2019-enlarged right inguinal and iliac nodes 04/10/2019-right inguinal lymph node biopsy-metastatic adenocarcinoma, ER 90%, PR 95%, PD-L1 combined positive score-1, MSS, tumor mutation burden-4 Cisplatin  05/16/2019 20-1 cycle Radiation to the lateral pelvis and 1 lymph nodes November 2020 CT  12/72/020-decrease size of right inguinal and external iliac lymph nodes, no evidence of disease progression Everolimus  and letrozole  07/14/2019 - 04/06/2020 CT 10/20/2019-resolution of right iliac lymph node, decrease in size of right inguinal node CT 03/08/2020-no evidence of recurrent or metastatic disease in the abdomen or pelvis CT 05/31/2020-no evidence of recurrent or metastatic disease CT 03/11/2021-increase right inguinal lymph node, enlargement of bilateral inguinal lymph nodes, subcutaneous soft tissue nodule at the anterior right thigh Carboplatin  04/06/2021, 04/25/2021, 05/16/2021 CT 07/12/2021-mild decrease in size of bilateral inguinal lymphadenopathy, no progressive disease in the abdomen or pelvis, 0.6 cm left inguinal node was 0.8 cm previously Letrozole  08/06/2021 CT abdomen/pelvis 04/10/2022-stable right inguinal lymph node, no new site of metastatic disease CT right femur  04/10/2022 -2.2 x 1 cm anterior subcutaneous mass Letrozole  continued CT right femur 07/19/2022-mild enlargement of right thigh subcutaneous mass stable right inguinal nodal mass CTs 07/19/2022-stable right inguinal lymph node, no new or progressive disease 11/20/2022-right thigh mass larger and tender, letrozole  discontinued 11/28/2022-significant enlargement of anterior right thigh soft tissue mass, slightly larger right inguinal mass 12/25/2022-wide local excision of right thigh mass, metastatic endometrial adenocarcinoma, ER positive, PR positive, negative resection margins Cycle 1 pembrolizumab /lenvatinib  02/05/2023 Cycle 2 Pembrolizumab  02/27/2023, lenvatinib  placed on hold due to mucositis and hypertension Cycle 3 Pembrolizumab  03/20/2023, lenvatinib  resumed at a dose of 10 mg daily Lenvatinib  discontinued 03/30/2023 due to mucositis, hand-foot symptoms Afinitor /letrozole  04/21/2023   2.  Left lower extremity lymphedema, improved after therapy in the lymphedema clinic   3.  Chronic diarrhea controlled with  Lomotil   Disposition: Dana [name] appears stable.  She continues Afinitor /letrozole .  She is tolerating treatment well.  The right inguinal mass  appears unchanged.  Plan to continue the same.  She will have restaging CTs prior to the next office visit.  CBC and chemistry panel reviewed.  Labs adequate to continue treatment as above.  She will return for follow-up in 4 weeks.    Olam Ned ANP/GNP-BC   07/17/2023  10:53 AM

## 2023-07-25 ENCOUNTER — Other Ambulatory Visit: Payer: Self-pay | Admitting: Nurse Practitioner

## 2023-07-25 ENCOUNTER — Telehealth: Payer: Self-pay | Admitting: *Deleted

## 2023-07-25 DIAGNOSIS — C55 Malignant neoplasm of uterus, part unspecified: Secondary | ICD-10-CM

## 2023-07-25 NOTE — Telephone Encounter (Signed)
 Ms. Flett called to request to have left leg imaged as well as abd/pelvis to f/u on area of mass. Central scheduling reports order will need to be amended to allow scheduling.

## 2023-07-25 NOTE — Telephone Encounter (Signed)
 Notified patient that CT femur has been added as requested. She will call to schedule.

## 2023-07-30 DIAGNOSIS — R051 Acute cough: Secondary | ICD-10-CM | POA: Diagnosis not present

## 2023-07-30 DIAGNOSIS — J302 Other seasonal allergic rhinitis: Secondary | ICD-10-CM | POA: Diagnosis not present

## 2023-07-30 DIAGNOSIS — C55 Malignant neoplasm of uterus, part unspecified: Secondary | ICD-10-CM | POA: Diagnosis not present

## 2023-07-30 DIAGNOSIS — I10 Essential (primary) hypertension: Secondary | ICD-10-CM | POA: Diagnosis not present

## 2023-07-30 DIAGNOSIS — R0602 Shortness of breath: Secondary | ICD-10-CM | POA: Diagnosis not present

## 2023-07-30 DIAGNOSIS — I77810 Thoracic aortic ectasia: Secondary | ICD-10-CM | POA: Diagnosis not present

## 2023-08-03 ENCOUNTER — Encounter: Payer: Self-pay | Admitting: Nurse Practitioner

## 2023-08-03 ENCOUNTER — Telehealth: Payer: Self-pay | Admitting: *Deleted

## 2023-08-03 NOTE — Telephone Encounter (Signed)
Reports having respiratory issues and was seen by PCP, Dr. Creola Corn [phone]). Started on inhaler. Was informed she is having CT abd/pelvis and asking for Dr. Truett Perna to add chest to the exam. Informed Dana [name] that Dr. Truett Perna needs to see his office note to be able to document the diagnosis for adding the chest and it would also require a new prior authorization and this may not get completed by 12/22. She is willing to push back her 1/22 scans to get this done at one time.  Instructed her to reach out to PCP to fax office note and provided our fax # as well as this RN will request note on Monday as well.

## 2023-08-06 ENCOUNTER — Other Ambulatory Visit: Payer: Self-pay | Admitting: *Deleted

## 2023-08-06 DIAGNOSIS — R059 Cough, unspecified: Secondary | ICD-10-CM

## 2023-08-06 DIAGNOSIS — C55 Malignant neoplasm of uterus, part unspecified: Secondary | ICD-10-CM

## 2023-08-07 ENCOUNTER — Telehealth: Payer: Self-pay | Admitting: *Deleted

## 2023-08-07 NOTE — Telephone Encounter (Signed)
Informed patient that CT femur was not yet approved. Canceled all her scans for now since her copay for any CT is $300. Wants to do all at one time. Will continue to f/u with managed care and reschedule when approved.

## 2023-08-08 ENCOUNTER — Ambulatory Visit (HOSPITAL_BASED_OUTPATIENT_CLINIC_OR_DEPARTMENT_OTHER): Payer: Medicare HMO

## 2023-08-09 ENCOUNTER — Telehealth: Payer: Self-pay | Admitting: *Deleted

## 2023-08-09 ENCOUNTER — Other Ambulatory Visit (HOSPITAL_BASED_OUTPATIENT_CLINIC_OR_DEPARTMENT_OTHER): Payer: Medicare HMO

## 2023-08-09 NOTE — Telephone Encounter (Signed)
CT femur has now been approved. CT (all) rescheduled for [facility] on 08/10/23 at 4 pm with arrival at 2:15 pm. Will start oral contrast at 2:30 pm. Will need to have liquids only 4 hours prior to the scan. Called patient with appointment and she is unsure if this will work for her. She was out and will have to check her schedule when she gets home. Informed her I will send a MyChart message with scan information and phone # to call to reschedule herself if she is not able to go tomorrow. Informed her to let us know if we need to move her MD visit out.

## 2023-08-10 ENCOUNTER — Ambulatory Visit (HOSPITAL_BASED_OUTPATIENT_CLINIC_OR_DEPARTMENT_OTHER)
Admission: RE | Admit: 2023-08-10 | Discharge: 2023-08-10 | Disposition: A | Discharge status: Home / Self Care | Payer: Medicare HMO | Source: Ambulatory Visit | Attending: Nurse Practitioner | Admitting: Nurse Practitioner

## 2023-08-10 ENCOUNTER — Other Ambulatory Visit: Payer: Self-pay | Admitting: Oncology

## 2023-08-10 DIAGNOSIS — K760 Fatty (change of) liver, not elsewhere classified: Secondary | ICD-10-CM | POA: Diagnosis not present

## 2023-08-10 DIAGNOSIS — R059 Cough, unspecified: Secondary | ICD-10-CM

## 2023-08-10 DIAGNOSIS — C541 Malignant neoplasm of endometrium: Secondary | ICD-10-CM | POA: Diagnosis not present

## 2023-08-10 DIAGNOSIS — K449 Diaphragmatic hernia without obstruction or gangrene: Secondary | ICD-10-CM | POA: Diagnosis not present

## 2023-08-10 DIAGNOSIS — R0602 Shortness of breath: Secondary | ICD-10-CM | POA: Diagnosis not present

## 2023-08-10 DIAGNOSIS — C55 Malignant neoplasm of uterus, part unspecified: Secondary | ICD-10-CM | POA: Insufficient documentation

## 2023-08-10 DIAGNOSIS — M7989 Other specified soft tissue disorders: Secondary | ICD-10-CM | POA: Diagnosis not present

## 2023-08-10 DIAGNOSIS — M799 Soft tissue disorder, unspecified: Secondary | ICD-10-CM | POA: Diagnosis not present

## 2023-08-10 DIAGNOSIS — R600 Localized edema: Secondary | ICD-10-CM | POA: Diagnosis not present

## 2023-08-10 MED ORDER — IOHEXOL 300 MG/ML  SOLN
100.0000 mL | Freq: Once | INTRAMUSCULAR | Status: AC | PRN
  Administered 2023-08-10: 100 mL via INTRAVENOUS

## 2023-08-15 ENCOUNTER — Other Ambulatory Visit: Payer: Self-pay | Admitting: *Deleted

## 2023-08-15 ENCOUNTER — Inpatient Hospital Stay (HOSPITAL_BASED_OUTPATIENT_CLINIC_OR_DEPARTMENT_OTHER): Payer: Medicare HMO | Admitting: Oncology

## 2023-08-15 ENCOUNTER — Inpatient Hospital Stay: Payer: Medicare HMO | Attending: Hematology and Oncology

## 2023-08-15 VITALS — BP 138/75 | HR 80 | Temp 97.9°F | Resp 18 | Ht 62.0 in | Wt 176.5 lb

## 2023-08-15 DIAGNOSIS — Z79811 Long term (current) use of aromatase inhibitors: Secondary | ICD-10-CM | POA: Diagnosis not present

## 2023-08-15 DIAGNOSIS — C55 Malignant neoplasm of uterus, part unspecified: Secondary | ICD-10-CM

## 2023-08-15 DIAGNOSIS — Z170 Estrogen receptor positive status [ER+]: Secondary | ICD-10-CM | POA: Insufficient documentation

## 2023-08-15 DIAGNOSIS — C7989 Secondary malignant neoplasm of other specified sites: Secondary | ICD-10-CM | POA: Diagnosis not present

## 2023-08-15 DIAGNOSIS — C541 Malignant neoplasm of endometrium: Secondary | ICD-10-CM | POA: Insufficient documentation

## 2023-08-15 LAB — CMP (CANCER CENTER ONLY)
ALT: 10 U/L (ref 0–44)
AST: 17 U/L (ref 15–41)
Albumin: 3.8 g/dL (ref 3.5–5.0)
Alkaline Phosphatase: 68 U/L (ref 38–126)
Anion gap: 6 (ref 5–15)
BUN: 12 mg/dL (ref 8–23)
CO2: 28 mmol/L (ref 22–32)
Calcium: 8.8 mg/dL — ABNORMAL LOW (ref 8.9–10.3)
Chloride: 100 mmol/L (ref 98–111)
Creatinine: 0.87 mg/dL (ref 0.44–1.00)
GFR, Estimated: >60 mL/min (ref >60)
Glucose, Bld: 94 mg/dL (ref 70–99)
Potassium: 4.1 mmol/L (ref 3.5–5.1)
Sodium: 134 mmol/L — ABNORMAL LOW (ref 135–145)
Total Bilirubin: 0.4 mg/dL (ref 0.0–1.2)
Total Protein: 6.6 g/dL (ref 6.5–8.1)

## 2023-08-15 LAB — CBC WITH DIFFERENTIAL (CANCER CENTER ONLY)
Abs Immature Granulocytes: 0.02 10*3/uL (ref 0.00–0.07)
Basophils Absolute: 0.1 10*3/uL (ref 0.0–0.1)
Basophils Relative: 1 %
Eosinophils Absolute: 0.5 10*3/uL (ref 0.0–0.5)
Eosinophils Relative: 8 %
HCT: 40.8 % (ref 36.0–46.0)
Hemoglobin: 13.5 g/dL (ref 12.0–15.0)
Immature Granulocytes: 0 %
Lymphocytes Relative: 23 %
Lymphs Abs: 1.4 10*3/uL (ref 0.7–4.0)
MCH: 32 pg (ref 26.0–34.0)
MCHC: 33.1 g/dL (ref 30.0–36.0)
MCV: 96.7 fL (ref 80.0–100.0)
Monocytes Absolute: 0.5 10*3/uL (ref 0.1–1.0)
Monocytes Relative: 8 %
Neutro Abs: 3.7 10*3/uL (ref 1.7–7.7)
Neutrophils Relative %: 60 %
Platelet Count: 283 10*3/uL (ref 150–400)
RBC: 4.22 MIL/uL (ref 3.87–5.11)
RDW: 14.8 % (ref 11.5–15.5)
WBC Count: 6.1 10*3/uL (ref 4.0–10.5)
nRBC: 0 % (ref 0.0–0.2)

## 2023-08-15 NOTE — Progress Notes (Signed)
[facility] OFFICE PROGRESS NOTE   Diagnosis: Endometrial cancer  INTERVAL HISTORY:   Dana [name] returns as scheduled.  She continues everolimus and letrozole.  She reports a cough and dyspnea for the past few months.  This is worse at night.  She has wheezing.  An inhaler helps.  Cough is nonproductive.  No fever.  No cold symptoms.  The right groin mass is larger.  She has discomfort at the right upper thigh.  Objective:  Vital signs in last 24 hours:  Blood pressure 138/75, pulse 80, temperature 97.9 F (36.6 C), temperature source Temporal, resp. rate 18, height 5\' 2"  (1.575 m), weight 176 lb 8 oz (80.1 kg), SpO2 100%.    Resp: Lungs clear bilaterally, no respiratory distress Cardio: Giller rate and rhythm GI: No hepatosplenomegaly, firm 5 cm fungating fixed mass at the right groin Vascular: Edema throughout the left leg  Skin: No evidence for recurrent tumor at the right thigh scar  Portacath/PICC-without erythema  Lab Results:  Lab Results  Component Value Date   WBC 6.1 08/15/2023   HGB 13.5 08/15/2023   HCT 40.8 08/15/2023   MCV 96.7 08/15/2023   PLT 283 08/15/2023   NEUTROABS 3.7 08/15/2023    CMP  Lab Results  Component Value Date   NA 134 (L) 08/15/2023   K 4.1 08/15/2023   CL 100 08/15/2023   CO2 28 08/15/2023   GLUCOSE 94 08/15/2023   BUN 12 08/15/2023   CREATININE 0.87 08/15/2023   CALCIUM 8.8 (L) 08/15/2023   PROT 6.6 08/15/2023   ALBUMIN 3.8 08/15/2023   AST 17 08/15/2023   ALT 10 08/15/2023   ALKPHOS 68 08/15/2023   BILITOT 0.4 08/15/2023   GFRNONAA >60 08/15/2023   GFRAA >60 04/06/2020     Medications: I have reviewed the patient's current medications.   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, status post a TAH, BSO, appendectomy, pelvic and periaortic lymphadenectomy, and wedge resection of a liver lesion Gross complete resection, tumor involve both ovaries, lymph  nodes negative 6 cycles of adjuvant Taxol/carboplatin CT abdomen/pelvis 03/28/2019-enlarged right inguinal and iliac nodes 04/10/2019-right inguinal lymph node biopsy-metastatic adenocarcinoma, ER 90%, PR 95%, PD-L1 combined positive score-1, MSS, tumor mutation burden-4 Cisplatin 05/16/2019 20-1 cycle Radiation to the lateral pelvis and 1 lymph nodes November 2020 CT 12/72/020-decrease size of right inguinal and external iliac lymph nodes, no evidence of disease progression Everolimus and letrozole 07/14/2019 - 04/06/2020 CT 10/20/2019-resolution of right iliac lymph node, decrease in size of right inguinal node CT 03/08/2020-no evidence of recurrent or metastatic disease in the abdomen or pelvis CT 05/31/2020-no evidence of recurrent or metastatic disease CT 03/11/2021-increase right inguinal lymph node, enlargement of bilateral inguinal lymph nodes, subcutaneous soft tissue nodule at the anterior right thigh Carboplatin 04/06/2021, 04/25/2021, 05/16/2021 CT 07/12/2021-mild decrease in size of bilateral inguinal lymphadenopathy, no progressive disease in the abdomen or pelvis, 0.6 cm left inguinal node was 0.8 cm previously Letrozole 08/06/2021 CT abdomen/pelvis 04/10/2022-stable right inguinal lymph node, no new site of metastatic disease CT right femur  04/10/2022 -2.2 x 1 cm anterior subcutaneous mass Letrozole continued CT right femur 07/19/2022-mild enlargement of right thigh subcutaneous mass stable right inguinal nodal mass CTs 07/19/2022-stable right inguinal lymph node, no new or progressive disease 11/20/2022-right thigh mass larger and tender, letrozole discontinued 11/28/2022-significant enlargement of anterior right thigh soft tissue mass, slightly larger right inguinal mass 12/25/2022-wide local excision of right thigh mass, metastatic endometrial adenocarcinoma, ER positive, PR  positive, negative resection margins Cycle 1 pembrolizumab/lenvatinib 02/05/2023 Cycle 2 Pembrolizumab 02/27/2023,  lenvatinib placed on hold due to mucositis and hypertension Cycle 3 Pembrolizumab 03/20/2023, lenvatinib resumed at a dose of 10 mg daily Lenvatinib discontinued 03/30/2023 due to mucositis, hand-foot symptoms Afinitor/letrozole 04/21/2023 CTs 08/10/2023: Increased nodular tissue anterior to the right hip, new 6 mm right lung nodule CT right femur 08/10/2023: No evidence of recurrent tumor at the right anterior thigh, enlargement of right inguinal soft tissue mass with an additional enlarging mass inferiorly   2.  Left lower extremity lymphedema, improved after therapy in the lymphedema clinic   3.  Chronic diarrhea controlled with Lomotil    Disposition: Dana [name] has metastatic endometrial cancer.  Her ischial diagnosis was in 2015 and she developed recurrent disease in 2020.  She has been treated with multiple systemic therapy regimens.  She has severe left leg lymphedema, likely related to radiation.  She does not wish to receive further radiation.  I doubt the right inguinal mass is resectable.  I reviewed the restaging CT findings and images with Dana [name].  There is clinical and radiologic evidence of disease progression of the right groin.  She will discontinue Afinitor tour/letrozole.  I recommend repeat treatment with carboplatin.  She last received carboplatin in October 2022 and did not progress on carboplatin.  She would like to pursue another opinion.  I will refer her to Dr. Pricilla Holm.  Dana [name] will return for an office visit in 2 weeks.  I doubt the dyspnea/cough are related to endometrial cancer or the current treatment.  She will call for progressive symptoms.  Thornton Papas, MD  08/15/2023  10:56 AM

## 2023-08-16 ENCOUNTER — Telehealth: Payer: Self-pay

## 2023-08-16 NOTE — Telephone Encounter (Signed)
LVM for pt to call office regarding a new patient referral. Next available is 2/13 @ 9:00 with Dr.Newton

## 2023-08-16 NOTE — Telephone Encounter (Signed)
Spoke with Dana [name] regarding her referral to GYN oncology. She has an appointment scheduled with Dr.Tucker on 08/30/2023 at 9:00am. Patient agrees to date and time. She has been provided with office address and location. She is also aware of our mask and visitor policy. Patient verbalized understanding and will call with any questions.

## 2023-08-24 ENCOUNTER — Encounter: Payer: Self-pay | Admitting: Gynecologic Oncology

## 2023-08-28 NOTE — Progress Notes (Signed)
GYNECOLOGIC ONCOLOGY NEW PATIENT CONSULTATION   Patient Name: Dana [name]  Patient Age: 73 y.o. Date of Service: 08/30/23 Referring Provider: Ladene Artist, MD [address] [place],  [place] [address]   Primary Care Provider: Ladene Artist, MD Consulting Provider: Eugene Garnet, MD   Assessment/Plan:  Postmenopausal patient with recurrent and progressive endometrial cancer.  The patient and I reviewed her cancer history extensively including prior treatments and current symptom burden.  She struggled significantly after her surgery to remove the right thigh lesion last year and has some remaining side effects.  While she is bothered by right groin pain and discharge from the erupting metastatic lesion in her groin, she voices desire to avoid surgery.  We discussed the challenges that any attempt at surgery would raise including location of her groin mass, nearby structures, and fixed nature of the mass on exam.  I also worry about poor wound healing given history of radiation to this location.  I think this could potentially be a quite morbid surgery and in the setting of a new pulmonary lesion that raises the concern for metastatic disease, I would not recommend surgery at this time.  With regard to her pulmonary lesion, we discussed close interval imaging with repeat CT of the chest in 3 months.  Given her new pulmonary symptoms over the last month, this lesion may be inflammatory or infectious but certainly warrants close follow-up.  I offered referral to gastroenterology given her GI symptoms.  These have improved significantly with some diet and lifestyle modifications.  With regard to treatment, we discussed multiple options for systemic treatment including traditional chemotherapy, hormonal therapy, and clinical trials.  In the setting of her neuropathy, the patient would not be a good candidate for weekly Taxol.  I think that retreatment with carboplatin would be  reasonable or single agent Doxil given favorable toxicity profile.  The patient voices the importance of quality over quantity of life.  In the setting of her strongly estrogen receptor tumor, although she has progressed on prior hormonal therapy, alternating Megace and tamoxifen would be a reasonable treatment option as would letrozole with palbociclib.   Although tumor was ERBB receptor negative in 2020, I recommended that we do HER2 IHC on her most recent resection from 2024.  Depending on these results, she may be a candidate for trastuzumab-deruxtecan.  Briefly discussed the data for this antibody drug conjugate in gynecologic cancers that express HER2.  There are 2 cold trials open at [facility], both phase 1, that I think patient would likely be eligible for.  We discussed the benefits of being on a clinical trial.  The patient was interested in finding out whether she was a candidate for a clinical trial.  I will send her information to the [facility] research staff.  With regard to her groin lesion, she was given nonstick pads to trial at home.  A copy of this note was sent to the patient's referring provider.   75 minutes of total time was spent for this patient encounter, including preparation, face-to-face counseling with the patient and coordination of care, and documentation of the encounter.  Eugene Garnet, MD  Division of Gynecologic Oncology  Department of Obstetrics and Gynecology  [facility]  ___________________________________________  Chief Complaint: Chief Complaint  Patient presents with   endometrial    History of Present Illness:  Dana [name] is a 73 y.o. y.o. female who is seen in consultation at the request of Thornton Papas  B, MD for an evaluation of recurrent endometrial cancer.  The patient initially presented with a 26 cm right adnexal mass and an elevated Ca1 25 at 404 units/mL. 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 This was an R0 resection. Final pathology showed a grade 3 endometrial adenocarcinoma (endometrioid) involving both ovaries.  Lymph nodes and other biopsies were negative.  She was received 6 cycles of carboplatin/paclitaxel adjuvant chemotherapy.  She was recommended adjuvant radiation therapy however declined this after counseling due to concern about toxicity.  At the completion of therapy her Ca1 25 was normal (4.1) and CT imaging showed no residual disease.    Following her initial therapy she developed severe left lower extremity lymphedema. She was followed every 3 months for a total of 5 years with no recurrence appreciated during that time.   In the fall 2020, she began experiencing some right upper quadrant and back pain.  This prompted a series of evaluations including a CT scan of the abdomen and pelvis which was performed on March 28, 2019.  This scan showed enlarged right inguinal lymph node with soft tissue mass measuring 3.8 x 3.5 cm and an enlarged right iliac lymph node measuring 1.9 x 1.1 cm.  Findings were concerning for malignancy.  A fine-needle aspirate biopsy was performed of the right inguinal lymph node on April 10, 2019 and this revealed metastatic adenocarcinoma consistent with endometrioid primary. ER 90%, PR 95%, PD-L1 combined positive score-1, MSS, TMB-4.   She received RT with sensitizing cisplain, completed in 05/2019. Post-treatment CT in 06/2019 showed decreased size of right inguinal and external iliac lymph nodes, no disease progression. She was then started on everolimus and letrozole, which she continued until 03/2020. CT in 05/2020 showed no evidence of recurrent or metastatic disease.   In 02/2021, imaging revealed enlarged bilateral inguinal lymph nodes,  nodule within the subcutaneous soft tissue of the anterior right thigh. She was treated with 3C of carboplatin only, last in late 04/2021. CT imaging in 06/2021 showed mild decrease in size of bilateral inguinal lymphadenopathy, no progressive disease in the abdomen or pelvis. She was then started on letrozole in 07/2021. Imaging in 03/2022 showed stable right inguinal lymph node, 2.2x1 cm right thigh mass, no new site of metastatic disease.  CT of the right femur in 07/2022 showed mild enlargement of the right thigh subcutaneous mass, stable right inguinal nodal mass, new static disease.  Repeat imaging in 11/2022 showed larger right thigh mass, which had become more tender.  Letrozole was discontinued at this time.  Patient underwent wide local excision of the right thigh mass on 12/25/22.  Tumor noted to be 4.5 cm consistent with metastatic adenocarcinoma from her primary endometrial cancer.  Margins negative.  P53 wild-type, ER/PR positive, p16 patchy staining.  In the setting of her MMR proficient tumor, she was started on pembrolizumab and lenvatinib in 01/2023.  Lenvatinib placed on hold in August at the time of cycle 2 secondary to mucositis and hypertension.  It was resumed in September with cycle 3 at a lower dose (10 mg q day).  Ultimately, lenvatinib was discontinued on 9/13 due to mucositis, hand/foot symptoms.  After 3 cycles, pembrolizumab was discontinued and the patient was started on everolimus and letrozole in 04/2023.  Her most recent imaging was performed on 08/10/23.  This  showed increased nodular tissue anterior to the right hip/inguinal soft tissue mass with an additional enlarging mass inferiorly, no recurrent tumor at the right anterior thigh, new 6 mm right lung nodule.  Interval history: Patient reports overall doing well today.  She notes that since having mass removed from her right thigh, she has had progressive pain in her right groin.  The lesion is now erupting through the skin,  weeping, and intermittently oozing.  She stopped her everolimus and letrozole after her recent CT scan.  She has struggled with diarrhea for some time, was frequently taking Lomotil.  Diarrhea has improved, especially since she started working with a nutritionist and making some diet changes.  She only has diarrhea episodes intermittently now.  If she takes Lomotil, this often gives her nausea and causes emesis.  Found that she is unable to eat larger meals.  If she does, frequently causes emesis.  She has not seen a gastroenterologist recently.  About a month ago, she began having shortness of breath and wheezing.  She describes this as having symptoms that started in the evening, typically improve in the morning.  She saw her primary care provider, has been using albuterol which helps some.  We discussed in detail prior treatments.  She notes having neuropathy with Taxol the second time, ultimately requiring cessation of Taxol.  She uses a grounding mat now which seems to have helped her neuropathy.  Cisplatin previously made her quite sick.  Treatment History: Oncology History Overview Note  ER 90%, PR 95% PD-L1 score 1% MSI Stable   Endometrioid adenocarcinoma of uterus (HCC)  01/05/2014 Initial Diagnosis   The patient has a history of a 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  The patient's initial surgery was with Dr. De Blanch in [place] on January 05, 2014.  It included an exploratory laparotomy with TAH, BSO, appendectomy, pelvic and para-aortic lymphadenectomy and wedge resection of a concerning appearing liver lesion.  Preoperatively she presented with a 26 cm right adnexal mass and an elevated Ca1 25 at 404 units/mL.  At the completion of surgery there was no gross residual disease.  Final pathology showed a primary grade 3 endometrial adenocarcinoma (endometrioid) involving both ovaries.  Lymph nodes and other biopsies  were negative.  She had uncomplicated postoperative course.  She was treated with 6 cycles of carboplatin paclitaxel chemotherapy.  She was recommended adjuvant radiation therapy however declined this after counseling.  Following her initial therapy she developed severe left lower extremity lymphedema.   Follow-up CT scan in March 2016 showed no evidence of disease.  She was followed every 3 months for a total of 5 years with no recurrence appreciated during that time   03/28/2019 Imaging   Ct abdomen and pelvis 1. Enlarged right inguinal lymph node or soft tissue mass, measuring 3.8 x 3.5 cm (series 2, image 69). Additional enlarged right iliac lymph node measuring 1.9 x 1.1 cm (series 2, image 68). Findings are concerning for malignancy.   2. Status post hysterectomy per stated history of endometrial malignancy.   3. No other findings in the abdomen or pelvis concerning for malignancy or metastatic disease.   4. There are multiple subcentimeter low-attenuation lesions of the liver, the largest in the left lobe the measuring 7 mm, too small to characterize (series 2, image 24). Attention on follow-up although these are most likely incidental benign small cysts and/or hemangiomata.   5.  Aortic atherosclerosis.  04/10/2019 Pathology Results   A. LYMPH NODE, RIGHT INGUINAL, NEEDLE CORE BIOPSY:  - Metastatic adenocarcinoma.  - See comment.   COMMENT:   The morphology is most consistent with metastatic endometrioid adenocarcinoma.  Immunohistochemistry can be performed if clinically indicated.  There is sufficient tissue for additional testing   04/10/2019 Procedure   Technically successful ultrasound guided biopsy of dominant right inguinal lymph node   04/22/2019 Cancer Staging   Staging form: Corpus Uteri - Carcinoma and Carcinosarcoma, AJCC 8th Edition - Pathologic stage from 04/22/2019: FIGO Stage IVB (rpT3a, pN0, pM1) - Signed by Artis Delay, MD on 04/22/2019   04/22/2019 PET scan   1.  Two hypermetabolic RIGHT iliac lymph nodes and larger metastatic RIGHT inguinal lymph node. 2. No evidence of metastatic disease outside the pelvis. 3. No clear evidence of activity at the vaginal cuff.   05/15/2019 Procedure   Placement of a subcutaneous port device. Catheter tip in the lower SVC   05/16/2019 - 06/12/2019 Chemotherapy   The patient had cisplatin for chemotherapy treatment.      Genetic Testing   Patient has genetic testing done for MSI. Results revealed patient has the following: MSI Stable   06/23/2019 Imaging   1. Interval decreased size of right inguinal and external iliac lymph nodes. 2. No evidence of progressive metastatic disease. 3. Stable small low-density hepatic lesions, likely cysts. 4. Aortic Atherosclerosis (ICD10-I70.0).     07/14/2019 - 04/06/2020 Chemotherapy   The patient had everolimus and letrozole for chemotherapy treatment.  Treatment was discontinued due to significant side effects    10/20/2019 Imaging   1. No acute abdominal/pelvic findings, mass lesions or adenopathy. 2. Interval resolution of right iliac lymph node. No residual measurable lymph node is identified. Decrease in size of the right inguinal node. 3. Stable scattered hepatic cysts.   Aortic Atherosclerosis (ICD10-I70.0).   03/08/2020 Imaging   No evidence of recurrent or metastatic carcinoma within the abdomen or pelvis.   Colonic diverticulosis. No radiographic evidence of diverticulitis.   Stable small epigastric ventral hernia containing only fat.     05/31/2020 Imaging   Status post hysterectomy and suspected bilateral salpingo-oophorectomy.   No evidence of recurrent or metastatic disease.   06/22/2020 Procedure   Successful right IJ vein Port-A-Cath explant.     03/11/2021 Imaging   1. There has been interval increase in size of a lymph node or soft tissue mass in the right inguinal station, measuring 3.0 x 2.6 cm, previously 1.9 x 1.7 cm. Interval enlargement  of bilateral inguinal lymph nodes. Findings are concerning for nodal metastatic disease. 2. There is a subcutaneous soft tissue nodule of the anterior right thigh measuring 1.6 x 1.0 cm, this level of the thigh not included in the field of view prior examination. This is of uncertain significance, potentially incidental and infectious or inflammatory, however concerning for malignancy given other findings. Consider targeted ultrasound and tissue sampling. 3. Status post hysterectomy and oophorectomy. 4. Small, fat containing midline epigastric hernia.   Aortic Atherosclerosis (ICD10-I70.0).   04/06/2021 - 05/16/2021 Chemotherapy   Patient is on Treatment Plan : UTERINE Carboplatin only     07/12/2021 Imaging   Mild decrease in size of bilateral inguinal lymphadenopathy since prior exam.   No new or progressive disease within the abdomen or pelvis.   Colonic diverticulosis. No radiographic evidence of diverticulitis.   Aortic Atherosclerosis (ICD10-I70.0).   02/05/2023 - 03/20/2023 Chemotherapy   Patient is on Treatment Plan : UTERINE Lenvatinib (20) D1-21 + Pembrolizumab (  200) D1 q21d       PAST MEDICAL HISTORY:  Past Medical History:  Diagnosis Date   Arthritis    GERD (gastroesophageal reflux disease)    History of blood transfusion    Hypothyroidism    Lymphedema    left leg- ted hose makes it worse   Thyroid disease    Uterine cancer (HCC)    Endometrial cancer - surgery and chemo     PAST SURGICAL HISTORY:  Past Surgical History:  Procedure Laterality Date   ABDOMINAL HYSTERECTOMY     APPENDECTOMY     COLONOSCOPY W/ POLYPECTOMY     EXCISION MASS LOWER EXTREMETIES Right 12/25/2022   Procedure: EXCISION OF RIGHT THIGH MASS, MALIGNANT AND ADVANCEMENT FLAP CLOSURE;  Surgeon: [name], MD;  Location: [facility];  Service: General;  Laterality: Right;   EYE SURGERY Bilateral    cataracts   IR IMAGING GUIDED PORT INSERTION  05/15/2019   IR REMOVAL TUN  ACCESS W/ PORT W/O FL MOD SED  06/23/2020   LIVER BIOPSY     mass removed     2015 from pelvis   TOTAL HIP ARTHROPLASTY Right 07/13/2016   Procedure: RIGHT TOTAL HIP ARTHROPLASTY ANTERIOR APPROACH;  Surgeon: Tarry Kos, MD;  Location: [facility] OR;  Service: Orthopedics;  Laterality: Right;    OB/GYN HISTORY:  OB History  No obstetric history on file.    No LMP recorded. Patient has had a hysterectomy.  Age at menarche: 57  Age at menopause: 52 Hx of HRT: denies (has been on letrozole as per HPI) Hx of STDs: denies Last pap: unsure History of abnormal pap smears: yes  MEDICATIONS: Outpatient Encounter Medications as of 08/30/2023  Medication Sig   albuterol (VENTOLIN HFA) 108 (90 Base) MCG/ACT inhaler Inhale 2 puffs into the lungs every 4 (four) hours as needed.   diphenoxylate-atropine (LOMOTIL) 2.5-0.025 MG tablet TAKE ONE TO TWO TABLETS BY MOUTH FOUR TIMES A DAY AS NEEDED FOR DIARRHEA / LOOSE STOOLS   levothyroxine (SYNTHROID) 50 MCG tablet Take 50 mcg by mouth daily before breakfast.   losartan (COZAAR) 25 MG tablet TAKE 1 TABLET BY MOUTH DAILY   prochlorperazine (COMPAZINE) 10 MG tablet Take 10 mg by mouth every 6 (six) hours as needed for nausea or vomiting.   everolimus (AFINITOR) 5 MG tablet Take 1 tablet (5 mg total) by mouth daily. (Patient not taking: Reported on 08/24/2023)   HYDROcodone-acetaminophen (NORCO/VICODIN) 5-325 MG tablet Take 1 tablet by mouth every 6 (six) hours as needed for moderate pain. (Patient not taking: Reported on 08/24/2023)   letrozole (FEMARA) 2.5 MG tablet Take 2.5 mg by mouth daily. (Patient not taking: Reported on 08/24/2023)   No facility-administered encounter medications on file as of 08/30/2023.    ALLERGIES:  Allergies  Allergen Reactions   Oxycodone Nausea And Vomiting    Other reaction(s): GI Upset,   Tetracycline     Other reaction(s): Unknown   Garlic Diarrhea and Other (See Comments)    Claims must have "organic"   Other Diarrhea and  Other (See Comments)    DAIRY PRODUCTS REDNESS Claims must have "organic"   Penicillins Rash    Has patient had a PCN reaction causing immediate rash, facial/tongue/throat swelling, SOB or lightheadedness with hypotension: Yes Has patient had a PCN reaction causing severe rash involving mucus membranes or skin necrosis: No Has patient had a PCN reaction that required hospitalization No Has patient had a PCN reaction occurring within the last 10 years:  Yes If all of the above answers are "NO", then may proceed with Cephalosporin use.      FAMILY HISTORY:  Family History  Problem Relation Age of Onset   Heart disease Mother    Multiple myeloma Mother    Breast cancer Neg Hx    Ovarian cancer Neg Hx    Pancreatic cancer Neg Hx    Colon cancer Neg Hx    Endometrial cancer Neg Hx    Prostate cancer Neg Hx      SOCIAL HISTORY:  Social Connections: Not on file    REVIEW OF SYSTEMS:  + Mouth sores, cough, shortness of breath, wheezing, dizziness, numbness Denies appetite changes, fevers, chills, fatigue, unexplained weight changes. Denies hearing loss, neck lumps or masses, ringing in ears or voice changes. Denies chest pain or palpitations. Denies leg swelling. Denies abdominal distention, pain, blood in stools, constipation, diarrhea, nausea, vomiting, or early satiety. Denies pain with intercourse, dysuria, frequency, hematuria or incontinence. Denies hot flashes, pelvic pain, vaginal bleeding or vaginal discharge.   Denies joint pain, back pain or muscle pain/cramps. Denies itching, rash, or wounds. Denies headaches or seizures. Denies swollen lymph nodes or glands, denies easy bruising or bleeding. Denies anxiety, depression, confusion, or decreased concentration.  Physical Exam:  Vital Signs for this encounter:  Blood pressure 130/88, pulse 71, temperature (!) 97.5 F (36.4 C), temperature source Oral, resp. rate 19, height 5\' 2"  (1.575 m), weight 176 lb (79.8 kg), SpO2  99%. Body mass index is 32.19 kg/m. General: Alert, oriented, no acute distress.  HEENT: Normocephalic, atraumatic. Sclera anicteric.  Chest: Clear to auscultation bilaterally. No wheezes, rhonchi, or rales. Cardiovascular: Regular rate and rhythm, no murmurs, rubs, or gallops.  Abdomen: Normoactive bowel sounds. Soft, nondistended, nontender to palpation. No masses or hepatosplenomegaly appreciated. No palpable fluid wave.  Extremities: Grossly normal range of motion. Warm, well perfused. 1+ edema of right thigh. Skin: No rashes or lesions.  Lymphatics: No cervical, supraclavicular. Within the right groin, there is a 4 x 2.5 cm fungating groin mass that is quite fixed.  There is an area extending superiorly with induration approximately 2-3 cm deep to the skin and superficial soft tissue.  There is no extension of this lesion medially, laterally, or inferior to the fungating mass.  Normal exudate noted. GU: Deferred.  LABORATORY AND RADIOLOGIC DATA:  Outside medical records were reviewed to synthesize the above history, along with the history and physical obtained during the visit.   Lab Results  Component Value Date   WBC 6.1 08/15/2023   HGB 13.5 08/15/2023   HCT 40.8 08/15/2023   PLT 283 08/15/2023   GLUCOSE 94 08/15/2023   ALT 10 08/15/2023   AST 17 08/15/2023   NA 134 (L) 08/15/2023   K 4.1 08/15/2023   CL 100 08/15/2023   CREATININE 0.87 08/15/2023   BUN 12 08/15/2023   CO2 28 08/15/2023   TSH 148.256 (H) 04/17/2023   INR 0.9 05/15/2019

## 2023-08-30 ENCOUNTER — Encounter: Payer: Self-pay | Admitting: Gynecologic Oncology

## 2023-08-30 ENCOUNTER — Inpatient Hospital Stay: Payer: Medicare HMO | Attending: Hematology and Oncology | Admitting: Gynecologic Oncology

## 2023-08-30 VITALS — BP 130/88 | HR 71 | Temp 97.5°F | Resp 19 | Ht 62.0 in | Wt 176.0 lb

## 2023-08-30 DIAGNOSIS — R197 Diarrhea, unspecified: Secondary | ICD-10-CM

## 2023-08-30 DIAGNOSIS — C7989 Secondary malignant neoplasm of other specified sites: Secondary | ICD-10-CM | POA: Diagnosis not present

## 2023-08-30 DIAGNOSIS — Z9221 Personal history of antineoplastic chemotherapy: Secondary | ICD-10-CM | POA: Diagnosis not present

## 2023-08-30 DIAGNOSIS — Z8542 Personal history of malignant neoplasm of other parts of uterus: Secondary | ICD-10-CM | POA: Diagnosis not present

## 2023-08-30 DIAGNOSIS — I890 Lymphedema, not elsewhere classified: Secondary | ICD-10-CM

## 2023-08-30 DIAGNOSIS — Z90722 Acquired absence of ovaries, bilateral: Secondary | ICD-10-CM | POA: Diagnosis not present

## 2023-08-30 DIAGNOSIS — R0602 Shortness of breath: Secondary | ICD-10-CM | POA: Insufficient documentation

## 2023-08-30 DIAGNOSIS — R062 Wheezing: Secondary | ICD-10-CM | POA: Diagnosis not present

## 2023-08-30 DIAGNOSIS — Z90710 Acquired absence of both cervix and uterus: Secondary | ICD-10-CM | POA: Insufficient documentation

## 2023-08-30 DIAGNOSIS — R918 Other nonspecific abnormal finding of lung field: Secondary | ICD-10-CM | POA: Diagnosis not present

## 2023-08-30 DIAGNOSIS — Z9079 Acquired absence of other genital organ(s): Secondary | ICD-10-CM | POA: Diagnosis not present

## 2023-08-30 DIAGNOSIS — C55 Malignant neoplasm of uterus, part unspecified: Secondary | ICD-10-CM

## 2023-08-30 DIAGNOSIS — C774 Secondary and unspecified malignant neoplasm of inguinal and lower limb lymph nodes: Secondary | ICD-10-CM | POA: Diagnosis not present

## 2023-08-30 DIAGNOSIS — G620 Drug-induced polyneuropathy: Secondary | ICD-10-CM | POA: Diagnosis not present

## 2023-08-30 NOTE — Patient Instructions (Signed)
It was nice to meet you today.  We discussed multiple options in terms of next steps for treatment.  We reviewed options of surgery, clinical trial, chemotherapy, hormonal therapy.  Based on our discussion, I will ask our pathologist here to add an additional test onto your tumor that was removed in 2024.  I will also reach out to the research team at [facility] to see if you are eligible for either of the studies that we have for recurrent endometrial cancer.  I will be in touch with both of these pieces of information although the clinical trials team may reach out to you directly if you are eligible for one of the studies.

## 2023-08-30 NOTE — Progress Notes (Signed)
Notified [facility] pathology [facility]) of testing request below:  HER2 IHC testing on her path from 2024 from a thigh mass, Case# [id number]

## 2023-08-31 ENCOUNTER — Inpatient Hospital Stay: Payer: Medicare HMO | Admitting: Oncology

## 2023-09-11 LAB — SURGICAL PATHOLOGY

## 2023-09-20 ENCOUNTER — Telehealth: Payer: Self-pay | Admitting: Oncology

## 2023-09-20 DIAGNOSIS — Z90710 Acquired absence of both cervix and uterus: Secondary | ICD-10-CM | POA: Diagnosis not present

## 2023-09-20 DIAGNOSIS — Z79890 Hormone replacement therapy (postmenopausal): Secondary | ICD-10-CM | POA: Diagnosis not present

## 2023-09-20 DIAGNOSIS — C541 Malignant neoplasm of endometrium: Secondary | ICD-10-CM | POA: Diagnosis not present

## 2023-09-20 DIAGNOSIS — Z79899 Other long term (current) drug therapy: Secondary | ICD-10-CM | POA: Diagnosis not present

## 2023-09-20 DIAGNOSIS — I10 Essential (primary) hypertension: Secondary | ICD-10-CM | POA: Diagnosis not present

## 2023-09-20 DIAGNOSIS — E039 Hypothyroidism, unspecified: Secondary | ICD-10-CM | POA: Diagnosis not present

## 2023-09-20 DIAGNOSIS — C55 Malignant neoplasm of uterus, part unspecified: Secondary | ICD-10-CM | POA: Diagnosis not present

## 2023-09-20 DIAGNOSIS — Z780 Asymptomatic menopausal state: Secondary | ICD-10-CM | POA: Diagnosis not present

## 2023-09-20 NOTE — Telephone Encounter (Signed)
 Called [place] and let her know the Her2 testing on her biopsy from June came back negative.  She verbalized understanding and mentioned that she saw Dr. Ginnie Smart at [facility] this morning.  Dr. Ginnie Smart is going to send Dr. Pricilla Holm a message with some recommendations.

## 2023-09-25 ENCOUNTER — Encounter: Payer: Self-pay | Admitting: Gynecologic Oncology

## 2023-09-28 ENCOUNTER — Other Ambulatory Visit: Payer: Self-pay | Admitting: Gynecologic Oncology

## 2023-09-28 DIAGNOSIS — Z923 Personal history of irradiation: Secondary | ICD-10-CM

## 2023-09-28 DIAGNOSIS — R109 Unspecified abdominal pain: Secondary | ICD-10-CM

## 2023-09-28 DIAGNOSIS — C55 Malignant neoplasm of uterus, part unspecified: Secondary | ICD-10-CM

## 2023-10-04 ENCOUNTER — Telehealth: Payer: Self-pay

## 2023-10-04 NOTE — Telephone Encounter (Signed)
-----   Message from Doylene Bode sent at 09/28/2023  8:44 AM EDT ----- I placed a referral to GI for her if we could put that on the watch list. Thanks

## 2023-10-04 NOTE — Telephone Encounter (Signed)
 I called [facility] to check on status of the referral by Dr.Tucker. I spoke to [place] who confirms referral has been received and she will reach out to Dana [name] today so she can be scheduled.

## 2023-10-05 ENCOUNTER — Inpatient Hospital Stay: Attending: Hematology and Oncology | Admitting: Oncology

## 2023-10-05 ENCOUNTER — Inpatient Hospital Stay

## 2023-10-05 VITALS — BP 121/77 | HR 77 | Temp 98.2°F | Resp 18 | Ht 62.0 in | Wt 179.0 lb

## 2023-10-05 DIAGNOSIS — R1110 Vomiting, unspecified: Secondary | ICD-10-CM | POA: Diagnosis not present

## 2023-10-05 DIAGNOSIS — C774 Secondary and unspecified malignant neoplasm of inguinal and lower limb lymph nodes: Secondary | ICD-10-CM | POA: Insufficient documentation

## 2023-10-05 DIAGNOSIS — C55 Malignant neoplasm of uterus, part unspecified: Secondary | ICD-10-CM | POA: Diagnosis not present

## 2023-10-05 DIAGNOSIS — Z7189 Other specified counseling: Secondary | ICD-10-CM

## 2023-10-05 DIAGNOSIS — C541 Malignant neoplasm of endometrium: Secondary | ICD-10-CM | POA: Insufficient documentation

## 2023-10-05 LAB — CMP (CANCER CENTER ONLY)
ALT: 9 U/L (ref 0–44)
AST: 16 U/L (ref 15–41)
Albumin: 4.1 g/dL (ref 3.5–5.0)
Alkaline Phosphatase: 61 U/L (ref 38–126)
Anion gap: 7 (ref 5–15)
BUN: 13 mg/dL (ref 8–23)
CO2: 26 mmol/L (ref 22–32)
Calcium: 9 mg/dL (ref 8.9–10.3)
Chloride: 102 mmol/L (ref 98–111)
Creatinine: 0.82 mg/dL (ref 0.44–1.00)
GFR, Estimated: >60 mL/min (ref >60)
Glucose, Bld: 92 mg/dL (ref 70–99)
Potassium: 4 mmol/L (ref 3.5–5.1)
Sodium: 135 mmol/L (ref 135–145)
Total Bilirubin: 0.4 mg/dL (ref 0.0–1.2)
Total Protein: 6.3 g/dL — ABNORMAL LOW (ref 6.5–8.1)

## 2023-10-05 LAB — CBC WITH DIFFERENTIAL (CANCER CENTER ONLY)
Abs Immature Granulocytes: 0.01 10*3/uL (ref 0.00–0.07)
Basophils Absolute: 0.1 10*3/uL (ref 0.0–0.1)
Basophils Relative: 1 %
Eosinophils Absolute: 0.3 10*3/uL (ref 0.0–0.5)
Eosinophils Relative: 6 %
HCT: 40.9 % (ref 36.0–46.0)
Hemoglobin: 13.8 g/dL (ref 12.0–15.0)
Immature Granulocytes: 0 %
Lymphocytes Relative: 21 %
Lymphs Abs: 1.2 10*3/uL (ref 0.7–4.0)
MCH: 31.9 pg (ref 26.0–34.0)
MCHC: 33.7 g/dL (ref 30.0–36.0)
MCV: 94.7 fL (ref 80.0–100.0)
Monocytes Absolute: 0.5 10*3/uL (ref 0.1–1.0)
Monocytes Relative: 9 %
Neutro Abs: 3.6 10*3/uL (ref 1.7–7.7)
Neutrophils Relative %: 63 %
Platelet Count: 272 10*3/uL (ref 150–400)
RBC: 4.32 MIL/uL (ref 3.87–5.11)
RDW: 13.7 % (ref 11.5–15.5)
WBC Count: 5.7 10*3/uL (ref 4.0–10.5)
nRBC: 0 % (ref 0.0–0.2)

## 2023-10-05 NOTE — Progress Notes (Signed)
 Left VM for [facility] IR regarding port placement for next week. Provided patient printed information on carboplatin and MD discussed side effects. She has had carboplatin in past.

## 2023-10-05 NOTE — Progress Notes (Signed)
 DISCONTINUE ON PATHWAY REGIMEN - Uterine     A cycle is every 21 days:     Lenvatinib      Pembrolizumab   **Always confirm dose/schedule in your pharmacy ordering system**  PRIOR TREATMENT: [id number]: Lenvatinib 20 mg Daily + Pembrolizumab 200 mg q21 Days Until Progression or Unacceptable Toxicity or up to 24 Months  START OFF PATHWAY REGIMEN - Uterine   [id number]:Carboplatin AUC=5 IV D1 q21 Days:   A cycle is every 21 days:     Carboplatin   **Always confirm dose/schedule in your pharmacy ordering system**  Patient Characteristics: Endometrioid, Recurrent/Progressive Disease, Third Line and Beyond, MSS/pMMR, HER2 Expression Equivocal/Negative/Unknown (IHC ? 2+) Histology: Endometrioid Therapeutic Status: Recurrent or Progressive Disease Microsatellite/Mismatch Repair Status: MSS/pMMR Line of Therapy: Third Line and Beyond HER2 Expression Status by IHC: Negative (IHC 0, 1+) Intent of Therapy: Non-Curative / Palliative Intent, Discussed with Patient

## 2023-10-05 NOTE — Progress Notes (Signed)
 [facility] OFFICE PROGRESS NOTE   Diagnosis: Endometrial cancer  INTERVAL HISTORY:   Dana [name] returns as scheduled.  The right groin mass is larger and "stings ".  The mass is bleeding.  She has intermittent episodes of left lower abdominal pain followed by vomiting.  This occurs infrequently (every 1 to 2 months) and has been a symptom for greater than a year.  She is having bowel movements.  She was evaluated by GYN oncology at [facility] on 09/20/2023.  They discussed enrollment on a phase 1 clinical trial, hormonal therapy, and various cytotoxic treatments.  Single agent carboplatin was recommended. She feels most comfortable proceeding with carboplatin.  She has received carboplatin in the past.  Objective:  Vital signs in last 24 hours:  Blood pressure 121/77, pulse 77, temperature 98.2 F (36.8 C), temperature source Temporal, resp. rate 18, height 5\' 2"  (1.575 m), weight 179 lb (81.2 kg), SpO2 100%.    Resp: Lungs clear bilaterally Cardio: Regular rate and rhythm GI: No hepatosplenomegaly, mild tenderness in the left lower abdomen, no mass.  Firm fungating greater than 5 cm mass of the right groin.  No bleeding. Vascular: Edema throughout the left leg, trace edema at the right lower leg    Portacath/PICC-without erythema  Lab Results:  Lab Results  Component Value Date   WBC 5.7 10/05/2023   HGB 13.8 10/05/2023   HCT 40.9 10/05/2023   MCV 94.7 10/05/2023   PLT 272 10/05/2023   NEUTROABS 3.6 10/05/2023    CMP  Lab Results  Component Value Date   NA 135 10/05/2023   K 4.0 10/05/2023   CL 102 10/05/2023   CO2 26 10/05/2023   GLUCOSE 92 10/05/2023   BUN 13 10/05/2023   CREATININE 0.82 10/05/2023   CALCIUM 9.0 10/05/2023   PROT 6.3 (L) 10/05/2023   ALBUMIN 4.1 10/05/2023   AST 16 10/05/2023   ALT 9 10/05/2023   ALKPHOS 61 10/05/2023   BILITOT 0.4 10/05/2023   GFRNONAA >60 10/05/2023   GFRAA >60 04/06/2020    Medications: I have reviewed the  patient's current medications.   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, status post a TAH, BSO, appendectomy, pelvic and periaortic lymphadenectomy, and wedge resection of a liver lesion Gross complete resection, tumor involve both ovaries, lymph nodes negative 6 cycles of adjuvant Taxol/carboplatin CT abdomen/pelvis 03/28/2019-enlarged right inguinal and iliac nodes 04/10/2019-right inguinal lymph node biopsy-metastatic adenocarcinoma, ER 90%, PR 95%, PD-L1 combined positive score-1, MSS, tumor mutation burden-4 Cisplatin 05/16/2019 20-1 cycle Radiation to the lateral pelvis and 1 lymph nodes November 2020 CT 12/72/020-decrease size of right inguinal and external iliac lymph nodes, no evidence of disease progression Everolimus and letrozole 07/14/2019 - 04/06/2020 CT 10/20/2019-resolution of right iliac lymph node, decrease in size of right inguinal node CT 03/08/2020-no evidence of recurrent or metastatic disease in the abdomen or pelvis CT 05/31/2020-no evidence of recurrent or metastatic disease CT 03/11/2021-increase right inguinal lymph node, enlargement of bilateral inguinal lymph nodes, subcutaneous soft tissue nodule at the anterior right thigh Carboplatin 04/06/2021, 04/25/2021, 05/16/2021 CT 07/12/2021-mild decrease in size of bilateral inguinal lymphadenopathy, no progressive disease in the abdomen or pelvis, 0.6 cm left inguinal node was 0.8 cm previously Letrozole 08/06/2021 CT abdomen/pelvis 04/10/2022-stable right inguinal lymph node, no new site of metastatic disease CT right femur  04/10/2022 -2.2 x 1 cm anterior subcutaneous mass Letrozole continued CT right femur 07/19/2022-mild enlargement of right thigh subcutaneous mass stable right inguinal nodal mass  CTs 07/19/2022-stable right inguinal lymph node, no new or progressive disease 11/20/2022-right thigh mass larger and tender, letrozole discontinued 11/28/2022-significant enlargement  of anterior right thigh soft tissue mass, slightly larger right inguinal mass 12/25/2022-wide local excision of right thigh mass, metastatic endometrial adenocarcinoma, ER positive, PR positive, Her 2 negative(0),negative resection margins Cycle 1 pembrolizumab/lenvatinib 02/05/2023 Cycle 2 Pembrolizumab 02/27/2023, lenvatinib placed on hold due to mucositis and hypertension Cycle 3 Pembrolizumab 03/20/2023, lenvatinib resumed at a dose of 10 mg daily Lenvatinib discontinued 03/30/2023 due to mucositis, hand-foot symptoms Afinitor/letrozole 04/21/2023 CTs 08/10/2023: Increased nodular tissue anterior to the right hip, new 6 mm right lung nodule CT right femur 08/10/2023: No evidence of recurrent tumor at the right anterior thigh, enlargement of right inguinal soft tissue mass with an additional enlarging mass inferiorly   2.  Left lower extremity lymphedema, improved after therapy in the lymphedema clinic   3.  Chronic diarrhea controlled with Lomotil      Disposition: Dana [name] has metastatic endometrial cancer.  There is progressive disease with an enlarging right groin masses.  We discussed treatment options.  Dr. Pricilla Holm recommends carboplatin.  Dana [name] agrees to proceed with single agent carboplatin patient/treatment carboplatin 2022 and did not progress while on carboplatin. We reviewed potential toxicities associated with carboplatin including the chance of nausea/vomiting, alopecia, hematologic toxicity, infection, and bleeding.  We discussed the risk of an allergic reaction with repeat carboplatin therapy.  She will be premedicated with Decadron, Pepcid, and Benadryl.  She has poor peripheral IV access.  We discussed Port-A-Cath placement.  She will think about this over the next few days and contact us with her decision on 10/08/2023.  She will be scheduled for cycle 1 carboplatin on 10/15/2023.  She will return for a nadir CBC on 10/25/2023. Dana [name] will be scheduled for an office visit and  cycle 2 carboplatin on 11/05/2023.  She requested a referral to gastroenterology to evaluate the left abdominal discomfort and intermittent vomiting.  We will make a referral to Dr. Adela Lank.  A treatment plan was entered today.  Thornton Papas, MD  10/05/2023  3:24 PM

## 2023-10-06 ENCOUNTER — Other Ambulatory Visit: Payer: Self-pay

## 2023-10-07 ENCOUNTER — Encounter: Payer: Self-pay | Admitting: Oncology

## 2023-10-08 ENCOUNTER — Other Ambulatory Visit: Payer: Self-pay | Admitting: *Deleted

## 2023-10-08 ENCOUNTER — Encounter: Payer: Self-pay | Admitting: Oncology

## 2023-10-08 DIAGNOSIS — C55 Malignant neoplasm of uterus, part unspecified: Secondary | ICD-10-CM

## 2023-10-08 NOTE — Telephone Encounter (Signed)
 I spoke to Dana [name] this morning to get an update on the GI referral placed by Dr.Tucker.   Turkey, from [facility] reached out to her and the first available appointment is May. Pt reports Dr.Sherrills nurse was going to look around for a sooner appointment. If there is nothing sooner available,she will call [facility] and get on with them.   Ms.Ganser wanted me to relay to Dr.Tucker, she is going on Carboplatin and her port will be placed in April.   Message sent to Dr.Tucker

## 2023-10-08 NOTE — Progress Notes (Signed)
 Referral order placed for GI for abdominal pain (L) and intermittent vomiting.

## 2023-10-09 ENCOUNTER — Telehealth: Payer: Self-pay | Admitting: Oncology

## 2023-10-09 ENCOUNTER — Other Ambulatory Visit: Payer: Self-pay

## 2023-10-09 NOTE — Telephone Encounter (Signed)
 Spoke with patient confirming upcoming appointment

## 2023-10-10 ENCOUNTER — Other Ambulatory Visit: Payer: Self-pay

## 2023-10-11 ENCOUNTER — Other Ambulatory Visit: Payer: Self-pay

## 2023-10-12 NOTE — Telephone Encounter (Signed)
 10/12/23 I spoke to Dana [name], she states she is still waiting to hear from Dr.Sherrill's nurse regarding getting her in to see GI sooner than May. Pt requested [facility] number so she can call next week if she doesn't hear something soon. PT to call us to give an update.

## 2023-10-14 ENCOUNTER — Other Ambulatory Visit: Payer: Self-pay

## 2023-10-16 NOTE — Telephone Encounter (Signed)
 LVM for patient to call office regarding update of [facility] referral from Dr.Tucker.

## 2023-10-17 ENCOUNTER — Other Ambulatory Visit: Payer: Self-pay

## 2023-10-25 ENCOUNTER — Other Ambulatory Visit

## 2023-10-26 ENCOUNTER — Other Ambulatory Visit: Payer: Self-pay | Admitting: Radiology

## 2023-10-27 NOTE — Progress Notes (Signed)
 Pharmacist Chemotherapy Monitoring - Initial Assessment    Anticipated start date: 11/06/23   The following has been reviewed per standard work regarding the patient's treatment regimen: The patient's diagnosis, treatment plan and drug doses, and organ/hematologic function Lab orders and baseline tests specific to treatment regimen  The treatment plan start date, drug sequencing, and pre-medications Prior authorization status  Patient's documented medication list, including drug-drug interaction screen and prescriptions for anti-emetics and supportive care specific to the treatment regimen The drug concentrations, fluid compatibility, administration routes, and timing of the medications to be used The patient's access for treatment and lifetime cumulative dose history, if applicable  The patient's medication allergies and previous infusion related reactions, if applicable   Changes made to treatment plan:  treatment plan date  Follow up needed:  Pending authorization for treatment    Faiga Stones Kreul, [facility], 10/27/2023  12:51 PM

## 2023-10-28 NOTE — H&P (Signed)
 Chief Complaint: Patient was seen in consultation today for endometrioid adenocarcinoma of uterus, with consideration for Port-A-Cath placement.  Referring Provider(s): Dr. Coni Deep, MD   Supervising Physician: Elene Griffes  Patient Status: [facility] - Out-pt  Patient is Full Code  History of Present Illness: Dana [name] is a 73 y.o. female  with PMHx notable for hypothyroidism, Arthritis, GERD, and uterine cancer.  Per Dr. Enedina Harrow progress note on 3/21: "Dana [name] has metastatic endometrial cancer.  There is progressive disease with an enlarging right groin masses.  We discussed treatment options.  Dr. Orvil Bland recommends carboplatin.  Dana [name] agrees to proceed with single agent carboplatin patient/treatment carboplatin 2022 and did not progress while on carboplatin. We reviewed potential toxicities associated with carboplatin including the chance of nausea/vomiting, alopecia, hematologic toxicity, infection, and bleeding.  We discussed the risk of an allergic reaction with repeat carboplatin therapy.  She will be premedicated with Decadron, Pepcid, and Benadryl.   She has poor peripheral IV access.  We discussed Port-A-Cath placement.  She will think about this over the next few days and contact us  with her decision on 10/08/2023.  She will be scheduled for cycle 1 carboplatin on 10/15/2023.  She will return for a nadir CBC on 10/25/2023. Dana [name] will be scheduled for an office visit and cycle 2 carboplatin on 11/05/2023."   Interventional Radiology was requested for Port-A-Cath placement. Patient is scheduled for same in IR today.  All labs and medications are within acceptable parameters. No pertinent allergies. Patient has been NPO since midnight.    Patient is currently without any significant complaints. Patient is alert and laying in bed, calm. Patient denies any fevers, headache, chest pain, SOB, cough, abdominal pain, nausea, vomiting or bleeding.     Past Medical  History:  Diagnosis Date   Arthritis    GERD (gastroesophageal reflux disease)    History of blood transfusion    Hypothyroidism    Lymphedema    left leg- ted hose makes it worse   Thyroid disease    Uterine cancer (HCC)    Endometrial cancer - surgery and chemo    Past Surgical History:  Procedure Laterality Date   ABDOMINAL HYSTERECTOMY     APPENDECTOMY     COLONOSCOPY W/ POLYPECTOMY     EXCISION MASS LOWER EXTREMETIES Right 12/25/2022   Procedure: EXCISION OF RIGHT THIGH MASS, MALIGNANT AND ADVANCEMENT FLAP CLOSURE;  Surgeon: Lockie Rima, MD;  Location: [facility];  Service: General;  Laterality: Right;   EYE SURGERY Bilateral    cataracts   IR IMAGING GUIDED PORT INSERTION  05/15/2019   IR REMOVAL TUN ACCESS W/ PORT W/O FL MOD SED  06/23/2020   LIVER BIOPSY     mass removed     2015 from pelvis   TOTAL HIP ARTHROPLASTY Right 07/13/2016   Procedure: RIGHT TOTAL HIP ARTHROPLASTY ANTERIOR APPROACH;  Surgeon: Wes Hamman, MD;  Location: [facility] OR;  Service: Orthopedics;  Laterality: Right;    Allergies: Oxycodone, Tetracycline, Garlic, Other, and Penicillins  Medications: Prior to Admission medications   Medication Sig Start Date End Date Taking? Authorizing Provider  albuterol (VENTOLIN HFA) 108 (90 Base) MCG/ACT inhaler Inhale 2 puffs into the lungs every 4 (four) hours as needed. 07/30/23   [provider]  diphenoxylate-atropine (LOMOTIL) 2.5-0.025 MG tablet TAKE ONE TO TWO TABLETS BY MOUTH FOUR TIMES A DAY AS NEEDED FOR DIARRHEA / LOOSE STOOLS 01/25/23   Sumner Ends, MD  HYDROcodone-acetaminophen (  NORCO/VICODIN) 5-325 MG tablet Take 1 tablet by mouth every 6 (six) hours as needed for moderate pain. Patient not taking: Reported on 03/06/2023 12/25/22   Byerly, Faera, MD  levothyroxine (SYNTHROID) 50 MCG tablet Take 50 mcg by mouth daily before breakfast. 03/30/23   [provider]  losartan (COZAAR) 25 MG tablet TAKE 1 TABLET BY MOUTH DAILY  06/04/23   Sumner Ends, MD  prochlorperazine (COMPAZINE) 10 MG tablet Take 10 mg by mouth every 6 (six) hours as needed for nausea or vomiting. Patient not taking: Reported on 10/05/2023    [provider]     Family History  Problem Relation Age of Onset   Heart disease Mother    Multiple myeloma Mother    Breast cancer Neg Hx    Ovarian cancer Neg Hx    Pancreatic cancer Neg Hx    Colon cancer Neg Hx    Endometrial cancer Neg Hx    Prostate cancer Neg Hx     Social History   Socioeconomic History   Marital status: Single    Spouse name: Not on file   Number of children: Not on file   Years of education: Not on file   Highest education level: Not on file  Occupational History   Occupation: food Social worker  Tobacco Use   Smoking status: Never   Smokeless tobacco: Never  Vaping Use   Vaping status: Never Used  Substance and Sexual Activity   Alcohol use: No    Alcohol/week: 0.0 standard drinks of alcohol   Drug use: No   Sexual activity: Not Currently  Other Topics Concern   Not on file  Social History Narrative   Not on file   Social Drivers of Health   Financial Resource Strain: Not on file  Food Insecurity: Not on file  Transportation Needs: Not on file  Physical Activity: Not on file  Stress: Not on file  Social Connections: Not on file     Review of Systems: A 12 point ROS discussed and pertinent positives are indicated in the HPI above.  All other systems are negative.  Vital Signs: Vitals:   10/29/23 1007  BP: 138/84  Pulse: 72  Resp: 18  Temp: 97.7 F (36.5 C)  SpO2: 98%      Advance Care Plan: The advanced care place/surrogate decision maker was discussed at the time of visit and the patient did not wish to discuss or was not able to name a surrogate decision maker or provide an advance care plan.  Physical Exam Constitutional:      General: She is not in acute distress.    Appearance: Normal appearance.  HENT:      Mouth/Throat:     Mouth: Mucous membranes are moist.  Cardiovascular:     Rate and Rhythm: Normal rate and regular rhythm.     Heart sounds: No murmur heard. Pulmonary:     Effort: Pulmonary effort is normal.     Breath sounds: Normal breath sounds. No wheezing.  Musculoskeletal:        General: Normal range of motion.     Cervical back: Normal range of motion.  Skin:    General: Skin is warm and dry.  Neurological:     Mental Status: She is alert and oriented to person, place, and time.  Psychiatric:        Mood and Affect: Mood normal.        Behavior: Behavior normal.  Thought Content: Thought content normal.        Judgment: Judgment normal.     Imaging: No results found.  Labs:  CBC: Recent Labs    06/20/23 1022 07/17/23 1010 08/15/23 1014 10/05/23 1420  WBC 5.1 5.0 6.1 5.7  HGB 13.3 13.1 13.5 13.8  HCT 39.1 39.4 40.8 40.9  PLT 285 249 283 272    COAGS: No results for input(s): "INR", "APTT" in the last 8760 hours.  BMP: Recent Labs    06/20/23 1022 07/17/23 1010 08/15/23 1014 10/05/23 1420  NA 137 135 134* 135  K 4.3 4.2 4.1 4.0  CL 102 101 100 102  CO2 28 27 28 26   GLUCOSE 94 119* 94 92  BUN 7* 12 12 13   CALCIUM 9.1 8.5* 8.8* 9.0  CREATININE 0.88 0.83 0.87 0.82  GFRNONAA >60 >60 >60 >60    LIVER FUNCTION TESTS: Recent Labs    06/20/23 1022 07/17/23 1010 08/15/23 1014 10/05/23 1420  BILITOT 0.6 0.6 0.4 0.4  AST 13* 14* 17 16  ALT 8 8 10 9   ALKPHOS 59 67 68 61  PROT 6.3* 6.1* 6.6 6.3*  ALBUMIN 3.9 3.9 3.8 4.1    TUMOR MARKERS: No results for input(s): "AFPTM", "CEA", "CA199", "CHROMGRNA" in the last 8760 hours.  Assessment and Plan: Patient has metastatic endometrial cancer, and followed by Dr. Scherrie Curt since 2015. Patient has recently developed an enlarging right groin masses.  She is re-starting chemotherapy, but has poor peripheral IV access.  Patient is agreeable to Port-A-Cath placement. Patient presents for same in IR  today.  Risks and benefits of image guided port-a-catheter placement was discussed with the patient including, but not limited to bleeding, infection, pneumothorax, or fibrin sheath development and need for additional procedures.  All of the patient's questions were answered, patient is agreeable to proceed.  Consent signed and in chart.      Thank you for allowing our service to participate in Dana [name] 's care.  Electronically Signed: Lovena Rubinstein, PA-C  Ernestina Headland Cory Kitt,PA-C 10/29/2023, 10:04 AM      I spent a total of 30 Minutes in face to face in clinical consultation, greater than 50% of which was counseling/coordinating care for endometrioid adenocarcinoma of uterus, with consideration for Port-A-Cath placement.

## 2023-10-29 ENCOUNTER — Other Ambulatory Visit: Payer: Self-pay

## 2023-10-29 ENCOUNTER — Ambulatory Visit (HOSPITAL_COMMUNITY)
Admission: RE | Admit: 2023-10-29 | Discharge: 2023-10-29 | Disposition: A | Discharge status: Home / Self Care | Source: Ambulatory Visit | Attending: Oncology | Admitting: Oncology

## 2023-10-29 ENCOUNTER — Encounter (HOSPITAL_COMMUNITY): Payer: Self-pay

## 2023-10-29 DIAGNOSIS — K219 Gastro-esophageal reflux disease without esophagitis: Secondary | ICD-10-CM | POA: Diagnosis not present

## 2023-10-29 DIAGNOSIS — C55 Malignant neoplasm of uterus, part unspecified: Secondary | ICD-10-CM

## 2023-10-29 DIAGNOSIS — C541 Malignant neoplasm of endometrium: Secondary | ICD-10-CM | POA: Diagnosis not present

## 2023-10-29 DIAGNOSIS — E039 Hypothyroidism, unspecified: Secondary | ICD-10-CM | POA: Diagnosis not present

## 2023-10-29 HISTORY — PX: IR IMAGING GUIDED PORT INSERTION: IMG5740

## 2023-10-29 MED ORDER — LIDOCAINE HCL 1 % IJ SOLN
20.0000 mL | Freq: Once | INTRAMUSCULAR | Status: AC
  Administered 2023-10-29: 10 mL via INTRADERMAL

## 2023-10-29 MED ORDER — LIDOCAINE-EPINEPHRINE 1 %-1:100000 IJ SOLN
20.0000 mL | Freq: Once | INTRAMUSCULAR | Status: AC
  Administered 2023-10-29: 10 mL via INTRADERMAL

## 2023-10-29 MED ORDER — SODIUM CHLORIDE 0.9 % IV SOLN: INTRAVENOUS | Status: DC

## 2023-10-29 MED ORDER — LIDOCAINE-EPINEPHRINE 1 %-1:100000 IJ SOLN
INTRAMUSCULAR | Status: AC
  Filled 2023-10-29: qty 1

## 2023-10-29 MED ORDER — HEPARIN SOD (PORK) LOCK FLUSH 100 UNIT/ML IV SOLN
INTRAVENOUS | Status: AC
  Filled 2023-10-29: qty 5

## 2023-10-29 MED ORDER — HEPARIN SOD (PORK) LOCK FLUSH 100 UNIT/ML IV SOLN
500.0000 [IU] | Freq: Once | INTRAVENOUS | Status: AC
  Administered 2023-10-29: 500 [IU] via INTRAVENOUS

## 2023-10-29 MED ORDER — MIDAZOLAM HCL 2 MG/2ML IJ SOLN
INTRAMUSCULAR | Status: AC | PRN
Start: 2023-10-29 — End: 2023-10-29
  Administered 2023-10-29: 1 mg via INTRAVENOUS

## 2023-10-29 MED ORDER — FENTANYL CITRATE (PF) 100 MCG/2ML IJ SOLN
INTRAMUSCULAR | Status: AC | PRN
Start: 2023-10-29 — End: 2023-10-29
  Administered 2023-10-29: 50 ug via INTRAVENOUS

## 2023-10-29 MED ORDER — MIDAZOLAM HCL 2 MG/2ML IJ SOLN
INTRAMUSCULAR | Status: AC | PRN
  Administered 2023-10-29: 1 mg via INTRAVENOUS

## 2023-10-29 MED ORDER — FENTANYL CITRATE (PF) 100 MCG/2ML IJ SOLN
INTRAMUSCULAR | Status: AC
  Filled 2023-10-29: qty 2

## 2023-10-29 MED ORDER — FENTANYL CITRATE (PF) 100 MCG/2ML IJ SOLN
INTRAMUSCULAR | Status: AC | PRN
  Administered 2023-10-29: 50 ug via INTRAVENOUS

## 2023-10-29 MED ORDER — MIDAZOLAM HCL 2 MG/2ML IJ SOLN
INTRAMUSCULAR | Status: AC
  Filled 2023-10-29: qty 4

## 2023-10-29 MED ORDER — LIDOCAINE HCL 1 % IJ SOLN
INTRAMUSCULAR | Status: AC
Start: 2023-10-29 — End: ?
  Filled 2023-10-29: qty 20

## 2023-10-29 NOTE — Procedures (Signed)
Interventional Radiology Procedure:   Indications: Endometrial cancer  Procedure: Port placement  Findings: Right jugular port, tip at SVC/RA junction  Complications: None     EBL: Minimal, less than 10 ml  Plan: Discharge in one hour.  Keep port site and incisions dry for at least 24 hours.     Batoul Limes R. Kazmir Oki, MD  Pager: [phone]   

## 2023-10-29 NOTE — Discharge Instructions (Signed)

## 2023-11-01 ENCOUNTER — Other Ambulatory Visit: Payer: Self-pay | Admitting: Oncology

## 2023-11-01 DIAGNOSIS — C55 Malignant neoplasm of uterus, part unspecified: Secondary | ICD-10-CM

## 2023-11-06 ENCOUNTER — Inpatient Hospital Stay (HOSPITAL_BASED_OUTPATIENT_CLINIC_OR_DEPARTMENT_OTHER): Admitting: Nurse Practitioner

## 2023-11-06 ENCOUNTER — Inpatient Hospital Stay: Attending: Hematology and Oncology

## 2023-11-06 ENCOUNTER — Encounter: Payer: Self-pay | Admitting: Nurse Practitioner

## 2023-11-06 ENCOUNTER — Inpatient Hospital Stay

## 2023-11-06 VITALS — BP 130/77 | HR 74 | Temp 98.0°F | Resp 18 | Ht 61.0 in | Wt 178.5 lb

## 2023-11-06 VITALS — BP 127/76 | HR 66 | Temp 98.1°F | Resp 18

## 2023-11-06 DIAGNOSIS — C774 Secondary and unspecified malignant neoplasm of inguinal and lower limb lymph nodes: Secondary | ICD-10-CM | POA: Insufficient documentation

## 2023-11-06 DIAGNOSIS — Z5111 Encounter for antineoplastic chemotherapy: Secondary | ICD-10-CM | POA: Diagnosis present

## 2023-11-06 DIAGNOSIS — C541 Malignant neoplasm of endometrium: Secondary | ICD-10-CM | POA: Diagnosis not present

## 2023-11-06 DIAGNOSIS — Z170 Estrogen receptor positive status [ER+]: Secondary | ICD-10-CM | POA: Diagnosis not present

## 2023-11-06 DIAGNOSIS — Z7189 Other specified counseling: Secondary | ICD-10-CM | POA: Diagnosis not present

## 2023-11-06 DIAGNOSIS — C7989 Secondary malignant neoplasm of other specified sites: Secondary | ICD-10-CM | POA: Insufficient documentation

## 2023-11-06 DIAGNOSIS — C55 Malignant neoplasm of uterus, part unspecified: Secondary | ICD-10-CM

## 2023-11-06 LAB — CMP (CANCER CENTER ONLY)
ALT: 9 U/L (ref 0–44)
AST: 19 U/L (ref 15–41)
Albumin: 4 g/dL (ref 3.5–5.0)
Alkaline Phosphatase: 74 U/L (ref 38–126)
Anion gap: 9 (ref 5–15)
BUN: 12 mg/dL (ref 8–23)
CO2: 24 mmol/L (ref 22–32)
Calcium: 8.9 mg/dL (ref 8.9–10.3)
Chloride: 101 mmol/L (ref 98–111)
Creatinine: 0.86 mg/dL (ref 0.44–1.00)
GFR, Estimated: >60 mL/min (ref >60)
Glucose, Bld: 81 mg/dL (ref 70–99)
Potassium: 4.2 mmol/L (ref 3.5–5.1)
Sodium: 135 mmol/L (ref 135–145)
Total Bilirubin: 0.4 mg/dL (ref 0.0–1.2)
Total Protein: 5.9 g/dL — ABNORMAL LOW (ref 6.5–8.1)

## 2023-11-06 LAB — CBC WITH DIFFERENTIAL (CANCER CENTER ONLY)
Abs Immature Granulocytes: 0.01 10*3/uL (ref 0.00–0.07)
Basophils Absolute: 0 10*3/uL (ref 0.0–0.1)
Basophils Relative: 0 %
Eosinophils Absolute: 0.3 10*3/uL (ref 0.0–0.5)
Eosinophils Relative: 6 %
HCT: 40 % (ref 36.0–46.0)
Hemoglobin: 13.6 g/dL (ref 12.0–15.0)
Immature Granulocytes: 0 %
Lymphocytes Relative: 21 %
Lymphs Abs: 1.1 10*3/uL (ref 0.7–4.0)
MCH: 32 pg (ref 26.0–34.0)
MCHC: 34 g/dL (ref 30.0–36.0)
MCV: 94.1 fL (ref 80.0–100.0)
Monocytes Absolute: 0.5 10*3/uL (ref 0.1–1.0)
Monocytes Relative: 9 %
Neutro Abs: 3.4 10*3/uL (ref 1.7–7.7)
Neutrophils Relative %: 64 %
Platelet Count: 248 10*3/uL (ref 150–400)
RBC: 4.25 MIL/uL (ref 3.87–5.11)
RDW: 13.2 % (ref 11.5–15.5)
WBC Count: 5.4 10*3/uL (ref 4.0–10.5)
nRBC: 0 % (ref 0.0–0.2)

## 2023-11-06 MED ORDER — SODIUM CHLORIDE 0.9% FLUSH
10.0000 mL | INTRAVENOUS | Status: DC | PRN
  Administered 2023-11-06: 10 mL

## 2023-11-06 MED ORDER — SODIUM CHLORIDE 0.9 % IV SOLN: INTRAVENOUS | Status: DC

## 2023-11-06 MED ORDER — LIDOCAINE-PRILOCAINE 2.5-2.5 % EX CREA
1.0000 | TOPICAL_CREAM | CUTANEOUS | 3 refills | Status: AC | PRN
Start: 2023-11-06 — End: ?

## 2023-11-06 MED ORDER — FAMOTIDINE IN NACL 20-0.9 MG/50ML-% IV SOLN
20.0000 mg | Freq: Once | INTRAVENOUS | Status: AC
  Administered 2023-11-06: 20 mg via INTRAVENOUS
  Filled 2023-11-06: qty 50

## 2023-11-06 MED ORDER — DEXAMETHASONE SODIUM PHOSPHATE 10 MG/ML IJ SOLN
10.0000 mg | Freq: Once | INTRAMUSCULAR | Status: AC
  Administered 2023-11-06: 10 mg via INTRAVENOUS
  Filled 2023-11-06: qty 1

## 2023-11-06 MED ORDER — SODIUM CHLORIDE 0.9 % IV SOLN
455.5000 mg | Freq: Once | INTRAVENOUS | Status: AC
  Administered 2023-11-06: 460 mg via INTRAVENOUS
  Filled 2023-11-06: qty 46

## 2023-11-06 MED ORDER — SODIUM CHLORIDE 0.9 % IV SOLN
150.0000 mg | Freq: Once | INTRAVENOUS | Status: AC
  Administered 2023-11-06: 150 mg via INTRAVENOUS
  Filled 2023-11-06: qty 150

## 2023-11-06 MED ORDER — DIPHENHYDRAMINE HCL 25 MG PO CAPS
50.0000 mg | ORAL_CAPSULE | Freq: Once | ORAL | Status: AC
Start: 2023-11-06 — End: 2023-11-06
  Administered 2023-11-06: 50 mg via ORAL
  Filled 2023-11-06: qty 2

## 2023-11-06 MED ORDER — HEPARIN SOD (PORK) LOCK FLUSH 100 UNIT/ML IV SOLN
500.0000 [IU] | Freq: Once | INTRAVENOUS | Status: AC | PRN
Start: 2023-11-06 — End: 2023-11-06
  Administered 2023-11-06: 500 [IU]

## 2023-11-06 MED ORDER — PALONOSETRON HCL INJECTION 0.25 MG/5ML
0.2500 mg | Freq: Once | INTRAVENOUS | Status: AC
  Administered 2023-11-06: 0.25 mg via INTRAVENOUS
  Filled 2023-11-06: qty 5

## 2023-11-06 NOTE — Progress Notes (Signed)
 [facility] OFFICE PROGRESS NOTE   Diagnosis: Endometrial cancer  INTERVAL HISTORY:   [name] returns as scheduled.  She had a Port-A-Cath placed 10/29/2023.  She is scheduled to begin treatment today with carboplatin .  She feels the right groin mass is getting larger.  She notes associated tenderness.  Some bleeding.  No nausea or vomiting.  Bowels are moving.  Objective:  Vital signs in last 24 hours:  Blood pressure 130/77, pulse 74, temperature 98 F (36.7 C), temperature source Temporal, resp. rate 18, height 5\' 1"  (1.549 m), weight 178 lb 8 oz (81 kg), SpO2 97%.    HEENT: No thrush or ulcers. Resp: Lungs clear bilaterally. Cardio: Regular rate and rhythm. GI: No hepatosplenomegaly.  Firm fungating mass measuring 6 to 7 cm at the right groin.  No bleeding. Vascular: Edema throughout the left leg.  No right leg edema.  Port-A-Cath without erythema.  Lab Results:  Lab Results  Component Value Date   WBC 5.4 11/06/2023   HGB 13.6 11/06/2023   HCT 40.0 11/06/2023   MCV 94.1 11/06/2023   PLT 248 11/06/2023   NEUTROABS 3.4 11/06/2023    Imaging:  No results found.  Medications: I have reviewed the patient's current medications.  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, status post a TAH, BSO, appendectomy, pelvic and periaortic lymphadenectomy, and wedge resection of a liver lesion Gross complete resection, tumor involve both ovaries, lymph nodes negative 6 cycles of adjuvant Taxol /carboplatin  CT abdomen/pelvis 03/28/2019-enlarged right inguinal and iliac nodes 04/10/2019-right inguinal lymph node biopsy-metastatic adenocarcinoma, ER 90%, PR 95%, PD-L1 combined positive score-1, MSS, tumor mutation burden-4 Cisplatin  05/16/2019 20-1 cycle Radiation to the lateral pelvis and 1 lymph nodes November 2020 CT 12/72/020-decrease size of right inguinal and external iliac lymph nodes, no evidence of disease  progression Everolimus  and letrozole  07/14/2019 - 04/06/2020 CT 10/20/2019-resolution of right iliac lymph node, decrease in size of right inguinal node CT 03/08/2020-no evidence of recurrent or metastatic disease in the abdomen or pelvis CT 05/31/2020-no evidence of recurrent or metastatic disease CT 03/11/2021-increase right inguinal lymph node, enlargement of bilateral inguinal lymph nodes, subcutaneous soft tissue nodule at the anterior right thigh Carboplatin  04/06/2021, 04/25/2021, 05/16/2021 CT 07/12/2021-mild decrease in size of bilateral inguinal lymphadenopathy, no progressive disease in the abdomen or pelvis, 0.6 cm left inguinal node was 0.8 cm previously Letrozole  08/06/2021 CT abdomen/pelvis 04/10/2022-stable right inguinal lymph node, no new site of metastatic disease CT right femur  04/10/2022 -2.2 x 1 cm anterior subcutaneous mass Letrozole  continued CT right femur 07/19/2022-mild enlargement of right thigh subcutaneous mass stable right inguinal nodal mass CTs 07/19/2022-stable right inguinal lymph node, no new or progressive disease 11/20/2022-right thigh mass larger and tender, letrozole  discontinued 11/28/2022-significant enlargement of anterior right thigh soft tissue mass, slightly larger right inguinal mass 12/25/2022-wide local excision of right thigh mass, metastatic endometrial adenocarcinoma, ER positive, PR positive, Her 2 negative(0),negative resection margins Cycle 1 pembrolizumab /lenvatinib  02/05/2023 Cycle 2 Pembrolizumab  02/27/2023, lenvatinib  placed on hold due to mucositis and hypertension Cycle 3 Pembrolizumab  03/20/2023, lenvatinib  resumed at a dose of 10 mg daily Lenvatinib  discontinued 03/30/2023 due to mucositis, hand-foot symptoms Afinitor /letrozole  04/21/2023 CTs 08/10/2023: Increased nodular tissue anterior to the right hip, new 6 mm right lung nodule CT right femur 08/10/2023: No evidence of recurrent tumor at the right anterior thigh, enlargement of right inguinal soft  tissue mass with an additional enlarging mass inferiorly Cycle 1 Carboplatin  11/06/2023   2.  Left lower extremity lymphedema,  improved after therapy in the lymphedema clinic   3.  Chronic diarrhea controlled with Lomotil     Disposition: Dana [name] appears stable.  She is scheduled begin treatment today with single agent carboplatin .  We again reviewed potential toxicities.  She agrees to proceed.  CBC and chemistry panel reviewed.  Labs adequate for treatment as above.  She will return for follow-up and cycle 2 carboplatin  in 3 weeks.  We are available to see her sooner if needed.  Diana Forster ANP/GNP-BC   11/06/2023  9:42 AM

## 2023-11-06 NOTE — Patient Instructions (Signed)
 [facility] - A DEPT OF [facility]  Discharge Instructions: Thank you for choosing [facility] to provide your oncology and hematology care.   If you have a lab appointment with the Cancer Center, please go directly to the Cancer Center and check in at the registration area.   Wear comfortable clothing and clothing appropriate for easy access to any Portacath or PICC line.   We strive to give you quality time with your provider. You may need to reschedule your appointment if you arrive late (15 or more minutes).  Arriving late affects you and other patients whose appointments are after yours.  Also, if you miss three or more appointments without notifying the office, you may be dismissed from the clinic at the provider's discretion.      For prescription refill requests, have your pharmacy contact our office and allow 72 hours for refills to be completed.    Today you received the following chemotherapy and/or immunotherapy agents carboplatin .      To help prevent nausea and vomiting after your treatment, we encourage you to take your nausea medication as directed.  BELOW ARE SYMPTOMS THAT SHOULD BE REPORTED IMMEDIATELY: *FEVER GREATER THAN 100.4 F (38 C) OR HIGHER *CHILLS OR SWEATING *NAUSEA AND VOMITING THAT IS NOT CONTROLLED WITH YOUR NAUSEA MEDICATION *UNUSUAL SHORTNESS OF BREATH *UNUSUAL BRUISING OR BLEEDING *URINARY PROBLEMS (pain or burning when urinating, or frequent urination) *BOWEL PROBLEMS (unusual diarrhea, constipation, pain near the anus) TENDERNESS IN MOUTH AND THROAT WITH OR WITHOUT PRESENCE OF ULCERS (sore throat, sores in mouth, or a toothache) UNUSUAL RASH, SWELLING OR PAIN  UNUSUAL VAGINAL DISCHARGE OR ITCHING   Items with * indicate a potential emergency and should be followed up as soon as possible or go to the Emergency Department if any problems should occur.  Please show the CHEMOTHERAPY ALERT CARD or  IMMUNOTHERAPY ALERT CARD at check-in to the Emergency Department and triage nurse.  Should you have questions after your visit or need to cancel or reschedule your appointment, please contact [facility] - A DEPT OF [facility]  Dept: [phone]  and follow the prompts.  Office hours are 8:00 a.m. to 4:30 p.m. Monday - Friday. Please note that voicemails left after 4:00 p.m. may not be returned until the following business day.  We are closed weekends and major holidays. You have access to a nurse at all times for urgent questions. Please call the main number to the clinic Dept: [phone] and follow the prompts.   For any non-urgent questions, you may also contact your provider using MyChart. We now offer e-Visits for anyone 5 and older to request care online for non-urgent symptoms. For details visit [email or web address].   Also download the MyChart app! Go to the app store, search "MyChart", open the app, select , and log in with your MyChart username and password.  Carboplatin  Injection What is this medication? CARBOPLATIN  (KAR boe pla tin) treats some types of cancer. It works by slowing down the growth of cancer cells. This medicine may be used for other purposes; ask your health care provider or pharmacist if you have questions. COMMON BRAND NAME(S): Paraplatin  What should I tell my care team before I take this medication? They need to know if you have any of these conditions: Blood disorders Hearing problems Kidney disease Recent or ongoing radiation therapy An unusual or allergic reaction to carboplatin , cisplatin , other medications, foods, dyes, or  preservatives Pregnant or trying to get pregnant Breast-feeding How should I use this medication? This medication is injected into a vein. It is given by your care team in a hospital or clinic setting. Talk to your care team about the use of this medication in children. Special care may  be needed. Overdosage: If you think you have taken too much of this medicine contact a poison control center or emergency room at once. NOTE: This medicine is only for you. Do not share this medicine with others. What if I miss a dose? Keep appointments for follow-up doses. It is important not to miss your dose. Call your care team if you are unable to keep an appointment. What may interact with this medication? Medications for seizures Some antibiotics, such as amikacin, gentamicin, neomycin, streptomycin, tobramycin Vaccines This list may not describe all possible interactions. Give your health care provider a list of all the medicines, herbs, non-prescription drugs, or dietary supplements you use. Also tell them if you smoke, drink alcohol , or use illegal drugs. Some items may interact with your medicine. What should I watch for while using this medication? Your condition will be monitored carefully while you are receiving this medication. You may need blood work while taking this medication. This medication may make you feel generally unwell. This is not uncommon, as chemotherapy can affect healthy cells as well as cancer cells. Report any side effects. Continue your course of treatment even though you feel ill unless your care team tells you to stop. In some cases, you may be given additional medications to help with side effects. Follow all directions for their use. This medication may increase your risk of getting an infection. Call your care team for advice if you get a fever, chills, sore throat, or other symptoms of a cold or flu. Do not treat yourself. Try to avoid being around people who are sick. Avoid taking medications that contain aspirin , acetaminophen , ibuprofen, naproxen , or ketoprofen unless instructed by your care team. These medications may hide a fever. Be careful brushing or flossing your teeth or using a toothpick because you may get an infection or bleed more easily. If you  have any dental work done, tell your dentist you are receiving this medication. Talk to your care team if you wish to become pregnant or think you might be pregnant. This medication can cause serious birth defects. Talk to your care team about effective forms of contraception. Do not breast-feed while taking this medication. What side effects may I notice from receiving this medication? Side effects that you should report to your care team as soon as possible: Allergic reactions--skin rash, itching, hives, swelling of the face, lips, tongue, or throat Infection--fever, chills, cough, sore throat, wounds that don't heal, pain or trouble when passing urine, general feeling of discomfort or being unwell Low red blood cell level--unusual weakness or fatigue, dizziness, headache, trouble breathing Pain, tingling, or numbness in the hands or feet, muscle weakness, change in vision, confusion or trouble speaking, loss of balance or coordination, trouble walking, seizures Unusual bruising or bleeding Side effects that usually do not require medical attention (report to your care team if they continue or are bothersome): Hair loss Nausea Unusual weakness or fatigue Vomiting This list may not describe all possible side effects. Call your doctor for medical advice about side effects. You may report side effects to FDA at 1-800-FDA-1088. Where should I keep my medication? This medication is given in a hospital or clinic. It will not  be stored at home. NOTE: This sheet is a summary. It may not cover all possible information. If you have questions about this medicine, talk to your doctor, pharmacist, or health care provider.  2024 Elsevier/Gold Standard (2021-10-25 00:00:00)

## 2023-11-06 NOTE — Progress Notes (Signed)
 Patient seen by Lonna Cobb NP today  Vitals are within treatment parameters:Yes   Labs are within treatment parameters: Yes   Treatment plan has been signed: Yes   Per physician team, Patient is ready for treatment and there are NO modifications to the treatment plan.

## 2023-11-06 NOTE — Addendum Note (Signed)
 Addended by: Coni Deep B on: 11/06/2023 10:11 AM   Modules accepted: Orders

## 2023-11-07 ENCOUNTER — Other Ambulatory Visit: Payer: Self-pay

## 2023-11-07 ENCOUNTER — Telehealth: Payer: Self-pay

## 2023-11-07 NOTE — Telephone Encounter (Signed)
 24 Hour Call Back  Telephone call to patient post First Time Carboplatin  infusion. Patient denies any concerns at this time or any side effects. She knows to call the clinic if this changes.

## 2023-11-26 ENCOUNTER — Inpatient Hospital Stay: Attending: Hematology and Oncology

## 2023-11-26 ENCOUNTER — Inpatient Hospital Stay (HOSPITAL_BASED_OUTPATIENT_CLINIC_OR_DEPARTMENT_OTHER): Admitting: Oncology

## 2023-11-26 ENCOUNTER — Inpatient Hospital Stay

## 2023-11-26 VITALS — BP 137/83 | HR 61 | Temp 98.1°F | Resp 17 | Wt 181.0 lb

## 2023-11-26 DIAGNOSIS — C774 Secondary and unspecified malignant neoplasm of inguinal and lower limb lymph nodes: Secondary | ICD-10-CM | POA: Diagnosis present

## 2023-11-26 DIAGNOSIS — C55 Malignant neoplasm of uterus, part unspecified: Secondary | ICD-10-CM | POA: Diagnosis not present

## 2023-11-26 DIAGNOSIS — Z7189 Other specified counseling: Secondary | ICD-10-CM

## 2023-11-26 DIAGNOSIS — Z8542 Personal history of malignant neoplasm of other parts of uterus: Secondary | ICD-10-CM | POA: Insufficient documentation

## 2023-11-26 DIAGNOSIS — Z95828 Presence of other vascular implants and grafts: Secondary | ICD-10-CM

## 2023-11-26 DIAGNOSIS — Z5111 Encounter for antineoplastic chemotherapy: Secondary | ICD-10-CM | POA: Diagnosis present

## 2023-11-26 DIAGNOSIS — Z170 Estrogen receptor positive status [ER+]: Secondary | ICD-10-CM | POA: Insufficient documentation

## 2023-11-26 LAB — CMP (CANCER CENTER ONLY)
ALT: 13 U/L (ref 0–44)
AST: 21 U/L (ref 15–41)
Albumin: 3.9 g/dL (ref 3.5–5.0)
Alkaline Phosphatase: 76 U/L (ref 38–126)
Anion gap: 10 (ref 5–15)
BUN: 12 mg/dL (ref 8–23)
CO2: 25 mmol/L (ref 22–32)
Calcium: 9.2 mg/dL (ref 8.9–10.3)
Chloride: 101 mmol/L (ref 98–111)
Creatinine: 0.81 mg/dL (ref 0.44–1.00)
GFR, Estimated: >60 mL/min (ref >60)
Glucose, Bld: 87 mg/dL (ref 70–99)
Potassium: 4.2 mmol/L (ref 3.5–5.1)
Sodium: 135 mmol/L (ref 135–145)
Total Bilirubin: 0.2 mg/dL (ref 0.0–1.2)
Total Protein: 6.1 g/dL — ABNORMAL LOW (ref 6.5–8.1)

## 2023-11-26 LAB — CBC WITH DIFFERENTIAL (CANCER CENTER ONLY)
Abs Immature Granulocytes: 0 10*3/uL (ref 0.00–0.07)
Basophils Absolute: 0 10*3/uL (ref 0.0–0.1)
Basophils Relative: 0 %
Eosinophils Absolute: 0.1 10*3/uL (ref 0.0–0.5)
Eosinophils Relative: 3 %
HCT: 36.9 % (ref 36.0–46.0)
Hemoglobin: 12.3 g/dL (ref 12.0–15.0)
Immature Granulocytes: 0 %
Lymphocytes Relative: 30 %
Lymphs Abs: 1.3 10*3/uL (ref 0.7–4.0)
MCH: 31.5 pg (ref 26.0–34.0)
MCHC: 33.3 g/dL (ref 30.0–36.0)
MCV: 94.4 fL (ref 80.0–100.0)
Monocytes Absolute: 0.5 10*3/uL (ref 0.1–1.0)
Monocytes Relative: 10 %
Neutro Abs: 2.5 10*3/uL (ref 1.7–7.7)
Neutrophils Relative %: 57 %
Platelet Count: 152 10*3/uL (ref 150–400)
RBC: 3.91 MIL/uL (ref 3.87–5.11)
RDW: 14.1 % (ref 11.5–15.5)
WBC Count: 4.5 10*3/uL (ref 4.0–10.5)
nRBC: 0 % (ref 0.0–0.2)

## 2023-11-26 MED ORDER — HEPARIN SOD (PORK) LOCK FLUSH 100 UNIT/ML IV SOLN
500.0000 [IU] | Freq: Once | INTRAVENOUS | Status: AC | PRN
  Administered 2023-11-26: 500 [IU]

## 2023-11-26 MED ORDER — DEXAMETHASONE SODIUM PHOSPHATE 10 MG/ML IJ SOLN
10.0000 mg | Freq: Once | INTRAMUSCULAR | Status: AC
  Administered 2023-11-26: 10 mg via INTRAVENOUS
  Filled 2023-11-26: qty 1

## 2023-11-26 MED ORDER — SODIUM CHLORIDE 0.9% FLUSH
10.0000 mL | INTRAVENOUS | Status: DC | PRN
  Administered 2023-11-26: 10 mL

## 2023-11-26 MED ORDER — SODIUM CHLORIDE 0.9 % IV SOLN: INTRAVENOUS | Status: DC

## 2023-11-26 MED ORDER — SODIUM CHLORIDE 0.9 % IV SOLN
150.0000 mg | Freq: Once | INTRAVENOUS | Status: AC
  Administered 2023-11-26: 150 mg via INTRAVENOUS
  Filled 2023-11-26: qty 150

## 2023-11-26 MED ORDER — SODIUM CHLORIDE 0.9 % IV SOLN
364.4000 mg | Freq: Once | INTRAVENOUS | Status: AC
  Administered 2023-11-26: 360 mg via INTRAVENOUS
  Filled 2023-11-26: qty 36

## 2023-11-26 MED ORDER — SODIUM CHLORIDE 0.9% FLUSH
10.0000 mL | Freq: Once | INTRAVENOUS | Status: AC
  Administered 2023-11-26: 10 mL via INTRAVENOUS

## 2023-11-26 MED ORDER — DEXAMETHASONE 4 MG PO TABS: 4.0000 mg | ORAL_TABLET | Freq: Two times a day (BID) | ORAL | 1 refills | Status: DC

## 2023-11-26 MED ORDER — PALONOSETRON HCL INJECTION 0.25 MG/5ML
0.2500 mg | Freq: Once | INTRAVENOUS | Status: AC
  Administered 2023-11-26: 0.25 mg via INTRAVENOUS
  Filled 2023-11-26: qty 5

## 2023-11-26 MED ORDER — FAMOTIDINE IN NACL 20-0.9 MG/50ML-% IV SOLN
20.0000 mg | Freq: Once | INTRAVENOUS | Status: AC
  Administered 2023-11-26: 20 mg via INTRAVENOUS
  Filled 2023-11-26: qty 50

## 2023-11-26 MED ORDER — DIPHENHYDRAMINE HCL 25 MG PO CAPS
50.0000 mg | ORAL_CAPSULE | Freq: Once | ORAL | Status: AC
  Administered 2023-11-26: 50 mg via ORAL
  Filled 2023-11-26: qty 2

## 2023-11-26 NOTE — Progress Notes (Signed)
 Established Patient Office Visit  Subjective   Patient ID: Dana [name], female    DOB: 1950-11-23  Age: 73 y.o. MRN: [id number]  Chief Complaint  Patient presents with   Chemotherapy    HPI    ROS    Objective:      BP 137/83 (BP Location: Left Arm, Patient Position: Sitting)   Pulse 61   Temp 98.1 F (36.7 C) (Temporal)   Resp 17   Wt 181 lb (82.1 kg)   SpO2 98%   BMI 34.20 kg/m    Physical Exam   Results for orders placed or performed in visit on 11/26/23  CMP (Cancer Center only)  Result Value Ref Range   Sodium 135 135 - 145 mmol/L   Potassium 4.2 3.5 - 5.1 mmol/L   Chloride 101 98 - 111 mmol/L   CO2 25 22 - 32 mmol/L   Glucose, Bld 87 70 - 99 mg/dL   BUN 12 8 - 23 mg/dL   Creatinine 0.10 2.72 - 1.00 mg/dL   Calcium 9.2 8.9 - 53.6 mg/dL   Total Protein 6.1 (L) 6.5 - 8.1 g/dL   Albumin 3.9 3.5 - 5.0 g/dL   AST 21 15 - 41 U/L   ALT 13 0 - 44 U/L   Alkaline Phosphatase 76 38 - 126 U/L   Total Bilirubin 0.2 0.0 - 1.2 mg/dL   GFR, Estimated >64 >40 mL/min   Anion gap 10 5 - 15  CBC with Differential (Cancer Center Only)  Result Value Ref Range   WBC Count 4.5 4.0 - 10.5 K/uL   RBC 3.91 3.87 - 5.11 MIL/uL   Hemoglobin 12.3 12.0 - 15.0 g/dL   HCT 34.7 42.5 - 95.6 %   MCV 94.4 80.0 - 100.0 fL   MCH 31.5 26.0 - 34.0 pg   MCHC 33.3 30.0 - 36.0 g/dL   RDW 38.7 56.4 - 33.2 %   Platelet Count 152 150 - 400 K/uL   nRBC 0.0 0.0 - 0.2 %   Neutrophils Relative % 57 %   Neutro Abs 2.5 1.7 - 7.7 K/uL   Lymphocytes Relative 30 %   Lymphs Abs 1.3 0.7 - 4.0 K/uL   Monocytes Relative 10 %   Monocytes Absolute 0.5 0.1 - 1.0 K/uL   Eosinophils Relative 3 %   Eosinophils Absolute 0.1 0.0 - 0.5 K/uL   Basophils Relative 0 %   Basophils Absolute 0.0 0.0 - 0.1 K/uL   Immature Granulocytes 0 %   Abs Immature Granulocytes 0.00 0.00 - 0.07 K/uL      The ASCVD Risk score (Arnett DK, et al., 2019) failed to calculate for the following reasons:   Cannot find  a previous HDL lab   Cannot find a previous total cholesterol lab    Assessment & Plan:   Problem List Items Addressed This Visit       Oncology   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    Elsa Halls, RN

## 2023-11-26 NOTE — Progress Notes (Signed)
 Per Dr. Scherrie Curt: Decreased Dana [name] dose today.

## 2023-11-26 NOTE — Progress Notes (Signed)
 [facility] OFFICE PROGRESS NOTE   Diagnosis: Uterine cancer  INTERVAL HISTORY:   Ms. Hickmott completed a cycle of carboplatin  11/06/2023.  She reports nausea beginning on day 2 and lasting for approximately 1 week.  No emesis.  No rash or symptoms of an allergic reaction.  No diarrhea.  She feels the right groin mass is smaller.  Objective:  Vital signs in last 24 hours:  Blood pressure 137/83, pulse 61, temperature 98.1 F (36.7 C), temperature source Temporal, resp. rate 17, weight 181 lb (82.1 kg), SpO2 98%.    HEENT: No thrush or ulcers Resp: Lungs clear bilaterally Cardio: Regular rate and rhythm GI: No hepatosplenomegaly, fungating right groin mass measuring 5-6 cm with mild oozing Vascular: Edema throughout the left leg  Portacath/PICC-without erythema  Lab Results:  Lab Results  Component Value Date   WBC 4.5 11/26/2023   HGB 12.3 11/26/2023   HCT 36.9 11/26/2023   MCV 94.4 11/26/2023   PLT 152 11/26/2023   NEUTROABS 2.5 11/26/2023    CMP  Lab Results  Component Value Date   NA 135 11/06/2023   K 4.2 11/06/2023   CL 101 11/06/2023   CO2 24 11/06/2023   GLUCOSE 81 11/06/2023   BUN 12 11/06/2023   CREATININE 0.86 11/06/2023   CALCIUM 8.9 11/06/2023   PROT 5.9 (L) 11/06/2023   ALBUMIN 4.0 11/06/2023   AST 19 11/06/2023   ALT 9 11/06/2023   ALKPHOS 74 11/06/2023   BILITOT 0.4 11/06/2023   GFRNONAA >60 11/06/2023   GFRAA >60 04/06/2020     Medications: I have reviewed the patient's current medications.   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, status post a TAH, BSO, appendectomy, pelvic and periaortic lymphadenectomy, and wedge resection of a liver lesion Gross complete resection, tumor involve both ovaries, lymph nodes negative 6 cycles of adjuvant Taxol /carboplatin  CT abdomen/pelvis 03/28/2019-enlarged right inguinal and iliac nodes 04/10/2019-right inguinal lymph node  biopsy-metastatic adenocarcinoma, ER 90%, PR 95%, PD-L1 combined positive score-1, MSS, tumor mutation burden-4 Cisplatin  05/16/2019 20-1 cycle Radiation to the lateral pelvis and 1 lymph nodes November 2020 CT 12/72/020-decrease size of right inguinal and external iliac lymph nodes, no evidence of disease progression Everolimus  and letrozole  07/14/2019 - 04/06/2020 CT 10/20/2019-resolution of right iliac lymph node, decrease in size of right inguinal node CT 03/08/2020-no evidence of recurrent or metastatic disease in the abdomen or pelvis CT 05/31/2020-no evidence of recurrent or metastatic disease CT 03/11/2021-increase right inguinal lymph node, enlargement of bilateral inguinal lymph nodes, subcutaneous soft tissue nodule at the anterior right thigh Carboplatin  04/06/2021, 04/25/2021, 05/16/2021 CT 07/12/2021-mild decrease in size of bilateral inguinal lymphadenopathy, no progressive disease in the abdomen or pelvis, 0.6 cm left inguinal node was 0.8 cm previously Letrozole  08/06/2021 CT abdomen/pelvis 04/10/2022-stable right inguinal lymph node, no new site of metastatic disease CT right femur  04/10/2022 -2.2 x 1 cm anterior subcutaneous mass Letrozole  continued CT right femur 07/19/2022-mild enlargement of right thigh subcutaneous mass stable right inguinal nodal mass CTs 07/19/2022-stable right inguinal lymph node, no new or progressive disease 11/20/2022-right thigh mass larger and tender, letrozole  discontinued 11/28/2022-significant enlargement of anterior right thigh soft tissue mass, slightly larger right inguinal mass 12/25/2022-wide local excision of right thigh mass, metastatic endometrial adenocarcinoma, ER positive, PR positive, Her 2 negative(0),negative resection margins Cycle 1 pembrolizumab /lenvatinib  02/05/2023 Cycle 2 Pembrolizumab  02/27/2023, lenvatinib  placed on hold due to mucositis and hypertension Cycle 3 Pembrolizumab  03/20/2023, lenvatinib  resumed at a dose of 10 mg  daily Lenvatinib   discontinued 03/30/2023 due to mucositis, hand-foot symptoms Afinitor /letrozole  04/21/2023 CTs 08/10/2023: Increased nodular tissue anterior to the right hip, new 6 mm right lung nodule CT right femur 08/10/2023: No evidence of recurrent tumor at the right anterior thigh, enlargement of right inguinal soft tissue mass with an additional enlarging mass inferiorly Cycle 1 Carboplatin  11/06/2023 Cycle 2 carboplatin  11/26/2023, dose reduction to AUC 4 due to following platelet count, home Decadron  prophylaxis   2.  Left lower extremity lymphedema, improved after therapy in the lymphedema clinic   3.  Chronic diarrhea controlled with Lomotil     Disposition: Ms. Deur has completed 1 cycle of carboplatin  for treatment of recurrent endometrial cancer.  The right groin mass is unchanged on exam today.  She tolerated the chemotherapy well aside from delayed nausea.  She will begin Decadron  prophylaxis with this cycle.  She will contact us  if she has nausea despite Decadron .  The platelet count is in the low end of the normal range today.  The carboplatin  will be dose reduced to an AUC of 4 beginning today.  She will return for an office visit and cycle 3 chemotherapy in 3 weeks.  She will be referred for a restaging CT chest and pelvis after cycle 3.  Coni Deep, MD  11/26/2023  11:41 AM

## 2023-11-26 NOTE — Patient Instructions (Signed)
 [facility] - A DEPT OF [facility]  Discharge Instructions: Thank you for choosing [facility] to provide your oncology and hematology care.   If you have a lab appointment with the Cancer Center, please go directly to the Cancer Center and check in at the registration area.   Wear comfortable clothing and clothing appropriate for easy access to any Portacath or PICC line.   We strive to give you quality time with your provider. You may need to reschedule your appointment if you arrive late (15 or more minutes).  Arriving late affects you and other patients whose appointments are after yours.  Also, if you miss three or more appointments without notifying the office, you may be dismissed from the clinic at the provider's discretion.      For prescription refill requests, have your pharmacy contact our office and allow 72 hours for refills to be completed.    Today you received the following chemotherapy and/or immunotherapy agents carboplatin .      To help prevent nausea and vomiting after your treatment, we encourage you to take your nausea medication as directed.  BELOW ARE SYMPTOMS THAT SHOULD BE REPORTED IMMEDIATELY: *FEVER GREATER THAN 100.4 F (38 C) OR HIGHER *CHILLS OR SWEATING *NAUSEA AND VOMITING THAT IS NOT CONTROLLED WITH YOUR NAUSEA MEDICATION *UNUSUAL SHORTNESS OF BREATH *UNUSUAL BRUISING OR BLEEDING *URINARY PROBLEMS (pain or burning when urinating, or frequent urination) *BOWEL PROBLEMS (unusual diarrhea, constipation, pain near the anus) TENDERNESS IN MOUTH AND THROAT WITH OR WITHOUT PRESENCE OF ULCERS (sore throat, sores in mouth, or a toothache) UNUSUAL RASH, SWELLING OR PAIN  UNUSUAL VAGINAL DISCHARGE OR ITCHING   Items with * indicate a potential emergency and should be followed up as soon as possible or go to the Emergency Department if any problems should occur.  Please show the CHEMOTHERAPY ALERT CARD or  IMMUNOTHERAPY ALERT CARD at check-in to the Emergency Department and triage nurse.  Should you have questions after your visit or need to cancel or reschedule your appointment, please contact [facility] - A DEPT OF [facility]  Dept: [phone]  and follow the prompts.  Office hours are 8:00 a.m. to 4:30 p.m. Monday - Friday. Please note that voicemails left after 4:00 p.m. may not be returned until the following business day.  We are closed weekends and major holidays. You have access to a nurse at all times for urgent questions. Please call the main number to the clinic Dept: [phone] and follow the prompts.   For any non-urgent questions, you may also contact your provider using MyChart. We now offer e-Visits for anyone 5 and older to request care online for non-urgent symptoms. For details visit [email or web address].   Also download the MyChart app! Go to the app store, search "MyChart", open the app, select , and log in with your MyChart username and password.  Carboplatin  Injection What is this medication? CARBOPLATIN  (KAR boe pla tin) treats some types of cancer. It works by slowing down the growth of cancer cells. This medicine may be used for other purposes; ask your health care provider or pharmacist if you have questions. COMMON BRAND NAME(S): Paraplatin  What should I tell my care team before I take this medication? They need to know if you have any of these conditions: Blood disorders Hearing problems Kidney disease Recent or ongoing radiation therapy An unusual or allergic reaction to carboplatin , cisplatin , other medications, foods, dyes, or  preservatives Pregnant or trying to get pregnant Breast-feeding How should I use this medication? This medication is injected into a vein. It is given by your care team in a hospital or clinic setting. Talk to your care team about the use of this medication in children. Special care may  be needed. Overdosage: If you think you have taken too much of this medicine contact a poison control center or emergency room at once. NOTE: This medicine is only for you. Do not share this medicine with others. What if I miss a dose? Keep appointments for follow-up doses. It is important not to miss your dose. Call your care team if you are unable to keep an appointment. What may interact with this medication? Medications for seizures Some antibiotics, such as amikacin, gentamicin, neomycin, streptomycin, tobramycin Vaccines This list may not describe all possible interactions. Give your health care provider a list of all the medicines, herbs, non-prescription drugs, or dietary supplements you use. Also tell them if you smoke, drink alcohol , or use illegal drugs. Some items may interact with your medicine. What should I watch for while using this medication? Your condition will be monitored carefully while you are receiving this medication. You may need blood work while taking this medication. This medication may make you feel generally unwell. This is not uncommon, as chemotherapy can affect healthy cells as well as cancer cells. Report any side effects. Continue your course of treatment even though you feel ill unless your care team tells you to stop. In some cases, you may be given additional medications to help with side effects. Follow all directions for their use. This medication may increase your risk of getting an infection. Call your care team for advice if you get a fever, chills, sore throat, or other symptoms of a cold or flu. Do not treat yourself. Try to avoid being around people who are sick. Avoid taking medications that contain aspirin , acetaminophen , ibuprofen, naproxen , or ketoprofen unless instructed by your care team. These medications may hide a fever. Be careful brushing or flossing your teeth or using a toothpick because you may get an infection or bleed more easily. If you  have any dental work done, tell your dentist you are receiving this medication. Talk to your care team if you wish to become pregnant or think you might be pregnant. This medication can cause serious birth defects. Talk to your care team about effective forms of contraception. Do not breast-feed while taking this medication. What side effects may I notice from receiving this medication? Side effects that you should report to your care team as soon as possible: Allergic reactions--skin rash, itching, hives, swelling of the face, lips, tongue, or throat Infection--fever, chills, cough, sore throat, wounds that don't heal, pain or trouble when passing urine, general feeling of discomfort or being unwell Low red blood cell level--unusual weakness or fatigue, dizziness, headache, trouble breathing Pain, tingling, or numbness in the hands or feet, muscle weakness, change in vision, confusion or trouble speaking, loss of balance or coordination, trouble walking, seizures Unusual bruising or bleeding Side effects that usually do not require medical attention (report to your care team if they continue or are bothersome): Hair loss Nausea Unusual weakness or fatigue Vomiting This list may not describe all possible side effects. Call your doctor for medical advice about side effects. You may report side effects to FDA at 1-800-FDA-1088. Where should I keep my medication? This medication is given in a hospital or clinic. It will not  be stored at home. NOTE: This sheet is a summary. It may not cover all possible information. If you have questions about this medicine, talk to your doctor, pharmacist, or health care provider.  2024 Elsevier/Gold Standard (2021-10-25 00:00:00)

## 2023-11-26 NOTE — Progress Notes (Signed)
 Patient seen by Dr. Coni Deep today  Vitals are within treatment parameters:Yes   Labs are within treatment parameters: Yes   Treatment plan has been signed: Yes   Per physician team, Patient is ready for treatment and there are NO modifications to the treatment plan. Have added dexamethasone  bid x 3 days after each tx beginning on day 2 to home regimen.

## 2023-11-28 ENCOUNTER — Ambulatory Visit: Admitting: Oncology

## 2023-11-28 ENCOUNTER — Other Ambulatory Visit

## 2023-11-28 ENCOUNTER — Ambulatory Visit

## 2023-11-29 ENCOUNTER — Other Ambulatory Visit: Payer: Self-pay

## 2023-11-30 ENCOUNTER — Other Ambulatory Visit: Payer: Self-pay | Admitting: *Deleted

## 2023-11-30 DIAGNOSIS — C55 Malignant neoplasm of uterus, part unspecified: Secondary | ICD-10-CM

## 2023-11-30 MED ORDER — LOSARTAN POTASSIUM 25 MG PO TABS: 25.0000 mg | ORAL_TABLET | Freq: Every day | ORAL | 1 refills | Status: DC

## 2023-11-30 NOTE — Telephone Encounter (Signed)
 Faxed refill request for losartan  potassium from pharmacy.

## 2023-12-09 ENCOUNTER — Other Ambulatory Visit: Payer: Self-pay

## 2023-12-11 ENCOUNTER — Other Ambulatory Visit: Payer: Self-pay | Admitting: Oncology

## 2023-12-13 NOTE — Progress Notes (Deleted)
 Brigitte Canard, PA-C [address] [place], [place]  [address] Phone: [phone]   Gastroenterology Consultation  Referring Provider:     Sumner Ends, MD Primary Care Physician:  Sumner Ends, MD Primary Gastroenterologist:  Brigitte Canard, PA-C / Alvester Johnson, MD  Reason for Consultation:     Abdominal pain, vomiting        HPI:   Dana [name] is a 73 y.o. y/o female referred for consultation & management  by Sumner Ends, MD.    Current symptoms:  PMH: Currently undergoing chemotherapy for uterine cancer (originally diagnosed June 2015).  S/p TAH, BSO, appendectomy, pelvic and periaortic lymphadenectomy, and wedge resection of a liver lesion in 2015.  She has been followed by oncologist Dr. Scherrie Curt for recurrent cancer since 2020.  Treated with multiple rounds of chemo.  Most recently Completed a cycle of carboplatin  11/06/23.  Nausea began on day 2 and lasted for 1 week.  She received cycle 2 of carboplatin  11/26/2023.  Also had f/u with Dr. Scherrie Curt 11/26/23.  08/06/2023 CT chest abdomen and pelvis with contrast: Increasing nodular tissue anterior to the right hip. Mass lesion is possible. Please see separate right femur CT. 2. New 6 mm right-sided lung nodule. Although this has a differential this could be a worrisome finding and recommend close follow-up with CT in 3 months. 3. Fatty liver infiltration.  Small hiatal hernia. 4. Colonic diverticula.  Parapelvic renal cysts.  Last colonoscopy 2010 done in [place], report not available. She reports it was a normal exam. No FH of CRC. No FH of Crohns or celiac dz.   Patient last saw Dr. General Kenner in our office 05/2015 to evaluate heme positive stool, diarrhea, and GERD.  Had history of longstanding diarrhea.  Dr. General Kenner recommended colonoscopy and patient declined procedure.  Past Medical History:  Diagnosis Date   Arthritis    GERD (gastroesophageal reflux disease)    History of blood transfusion     Hypothyroidism    Lymphedema    left leg- ted hose makes it worse   Thyroid  disease    Uterine cancer (HCC)    Endometrial cancer - surgery and chemo    Past Surgical History:  Procedure Laterality Date   ABDOMINAL HYSTERECTOMY     APPENDECTOMY     COLONOSCOPY W/ POLYPECTOMY     EXCISION MASS LOWER EXTREMETIES Right 12/25/2022   Procedure: EXCISION OF RIGHT THIGH MASS, MALIGNANT AND ADVANCEMENT FLAP CLOSURE;  Surgeon: Lockie Rima, MD;  Location: [facility];  Service: General;  Laterality: Right;   EYE SURGERY Bilateral    cataracts   IR IMAGING GUIDED PORT INSERTION  05/15/2019   IR IMAGING GUIDED PORT INSERTION  10/29/2023   IR REMOVAL TUN ACCESS W/ PORT W/O FL MOD SED  06/23/2020   LIVER BIOPSY     mass removed     2015 from pelvis   TOTAL HIP ARTHROPLASTY Right 07/13/2016   Procedure: RIGHT TOTAL HIP ARTHROPLASTY ANTERIOR APPROACH;  Surgeon: Wes Hamman, MD;  Location: [facility];  Service: Orthopedics;  Laterality: Right;    Prior to Admission medications   Medication Sig Start Date End Date Taking? Authorizing Provider  albuterol (VENTOLIN HFA) 108 (90 Base) MCG/ACT inhaler Inhale 2 puffs into the lungs every 4 (four) hours as needed. Patient not taking: Reported on 11/26/2023 07/30/23   [provider]  dexamethasone  (DECADRON ) 4 MG tablet Take 1 tablet (4 mg total) by mouth 2 (two)  times daily. Take twice daily x 3 days after each chemotherapy treatment (begin on day after each treatment) 11/26/23   Sumner Ends, MD  diphenoxylate -atropine  (LOMOTIL ) 2.5-0.025 MG tablet TAKE ONE TO TWO TABLETS BY MOUTH FOUR TIMES A DAY AS NEEDED FOR DIARRHEA / LOOSE STOOLS Patient not taking: Reported on 11/26/2023 01/25/23   Sumner Ends, MD  HYDROcodone -acetaminophen  (NORCO/VICODIN) 5-325 MG tablet Take 1 tablet by mouth every 6 (six) hours as needed for moderate pain. Patient not taking: Reported on 03/06/2023 12/25/22   Lockie Rima, MD  levothyroxine  (SYNTHROID )  50 MCG tablet Take 50 mcg by mouth daily before breakfast. 03/30/23   [provider]  lidocaine -prilocaine  (EMLA ) cream Apply 1 Application topically as needed. Apply to portacath 1-2 hours prior to use 11/06/23   Thomas, Lisa K, NP  losartan  (COZAAR ) 25 MG tablet Take 1 tablet (25 mg total) by mouth daily. 11/30/23   Sumner Ends, MD  prochlorperazine  (COMPAZINE ) 10 MG tablet Take 10 mg by mouth every 6 (six) hours as needed for nausea or vomiting.    [provider]    Family History  Problem Relation Age of Onset   Heart disease Mother    Multiple myeloma Mother    Breast cancer Neg Hx    Ovarian cancer Neg Hx    Pancreatic cancer Neg Hx    Colon cancer Neg Hx    Endometrial cancer Neg Hx    Prostate cancer Neg Hx      Social History   Tobacco Use   Smoking status: Never   Smokeless tobacco: Never  Vaping Use   Vaping status: Never Used  Substance Use Topics   Alcohol  use: No    Alcohol /week: 0.0 standard drinks of alcohol    Drug use: No    Allergies as of 12/14/2023 - Review Complete 11/26/2023  Allergen Reaction Noted   Oxycodone  Nausea And Vomiting 04/07/2019   Tetracycline  10/14/2014   Garlic Diarrhea and Other (See Comments) 07/12/2016   Other Diarrhea and Other (See Comments) 07/03/2016   Penicillins Rash 06/28/2016    Review of Systems:    All systems reviewed and negative except where noted in HPI.   Physical Exam:  There were no vitals taken for this visit. No LMP recorded. Patient has had a hysterectomy.  General:   Alert,  Well-developed, well-nourished, pleasant and cooperative in NAD Lungs:  Respirations even and unlabored.  Clear throughout to auscultation.   No wheezes, crackles, or rhonchi. No acute distress. Heart:  Regular rate and rhythm; no murmurs, clicks, rubs, or gallops. Abdomen:  Normal bowel sounds.  No bruits.  Soft, and non-distended without masses, hepatosplenomegaly or hernias noted.  No Tenderness.  No guarding  or rebound tenderness.    Neurologic:  Alert and oriented x3;  grossly normal neurologically. Psych:  Alert and cooperative. Normal mood and affect.  Imaging Studies: No results found.  Labs: CBC    Component Value Date/Time   WBC 4.5 11/26/2023 1120   WBC 3.2 (L) 05/16/2021 1043   RBC 3.91 11/26/2023 1120   HGB 12.3 11/26/2023 1120   HCT 36.9 11/26/2023 1120   PLT 152 11/26/2023 1120   MCV 94.4 11/26/2023 1120   MCH 31.5 11/26/2023 1120   MCHC 33.3 11/26/2023 1120   RDW 14.1 11/26/2023 1120   LYMPHSABS 1.3 11/26/2023 1120   MONOABS 0.5 11/26/2023 1120   EOSABS 0.1 11/26/2023 1120   BASOSABS 0.0 11/26/2023 1120    CMP  Component Value Date/Time   NA 135 11/26/2023 1120   K 4.2 11/26/2023 1120   CL 101 11/26/2023 1120   CO2 25 11/26/2023 1120   GLUCOSE 87 11/26/2023 1120   BUN 12 11/26/2023 1120   CREATININE 0.81 11/26/2023 1120   CALCIUM 9.2 11/26/2023 1120   PROT 6.1 (L) 11/26/2023 1120   ALBUMIN 3.9 11/26/2023 1120   AST 21 11/26/2023 1120   ALT 13 11/26/2023 1120   ALKPHOS 76 11/26/2023 1120   BILITOT 0.2 11/26/2023 1120   GFRNONAA >60 11/26/2023 1120   GFRAA >60 04/06/2020 0827    Assessment and Plan:   SHIRLINE KENDLE is a 73 y.o. y/o female has been referred for   1.  Uterine cancer originally diagnosed in 2015 with recurrence and metastatic disease.  Currently undergoing chemotherapy followed by Dr. Scherrie Curt oncologist.  2.  Nausea and vomiting; most likely adverse side effects of chemotherapy - Continue Compazine  - Zofran  - Upper GI series - Trial of PPI  3.  Left-sided abdominal pain - Schedule repeat abdominal pelvic CT  Follow up ***  Brigitte Canard, PA-C

## 2023-12-14 ENCOUNTER — Ambulatory Visit: Admitting: Physician Assistant

## 2023-12-18 ENCOUNTER — Inpatient Hospital Stay

## 2023-12-18 ENCOUNTER — Encounter: Payer: Self-pay | Admitting: Nurse Practitioner

## 2023-12-18 ENCOUNTER — Inpatient Hospital Stay: Admitting: Nurse Practitioner

## 2023-12-18 ENCOUNTER — Telehealth: Payer: Self-pay

## 2023-12-18 ENCOUNTER — Inpatient Hospital Stay: Attending: Hematology and Oncology

## 2023-12-18 VITALS — BP 125/83 | HR 78 | Temp 97.8°F | Resp 18 | Ht 61.0 in | Wt 179.5 lb

## 2023-12-18 DIAGNOSIS — C778 Secondary and unspecified malignant neoplasm of lymph nodes of multiple regions: Secondary | ICD-10-CM | POA: Insufficient documentation

## 2023-12-18 DIAGNOSIS — Z7189 Other specified counseling: Secondary | ICD-10-CM

## 2023-12-18 DIAGNOSIS — C774 Secondary and unspecified malignant neoplasm of inguinal and lower limb lymph nodes: Secondary | ICD-10-CM | POA: Diagnosis present

## 2023-12-18 DIAGNOSIS — C55 Malignant neoplasm of uterus, part unspecified: Secondary | ICD-10-CM

## 2023-12-18 DIAGNOSIS — Z5111 Encounter for antineoplastic chemotherapy: Secondary | ICD-10-CM | POA: Diagnosis present

## 2023-12-18 DIAGNOSIS — Z79630 Long term (current) use of alkylating agent: Secondary | ICD-10-CM | POA: Insufficient documentation

## 2023-12-18 DIAGNOSIS — Z170 Estrogen receptor positive status [ER+]: Secondary | ICD-10-CM | POA: Insufficient documentation

## 2023-12-18 LAB — CMP (CANCER CENTER ONLY)
ALT: 14 U/L (ref 0–44)
AST: 20 U/L (ref 15–41)
Albumin: 3.9 g/dL (ref 3.5–5.0)
Alkaline Phosphatase: 71 U/L (ref 38–126)
Anion gap: 12 (ref 5–15)
BUN: 11 mg/dL (ref 8–23)
CO2: 24 mmol/L (ref 22–32)
Calcium: 9.2 mg/dL (ref 8.9–10.3)
Chloride: 100 mmol/L (ref 98–111)
Creatinine: 0.8 mg/dL (ref 0.44–1.00)
GFR, Estimated: >60 mL/min (ref >60)
Glucose, Bld: 88 mg/dL (ref 70–99)
Potassium: 4.2 mmol/L (ref 3.5–5.1)
Sodium: 136 mmol/L (ref 135–145)
Total Bilirubin: 0.3 mg/dL (ref 0.0–1.2)
Total Protein: 6.4 g/dL — ABNORMAL LOW (ref 6.5–8.1)

## 2023-12-18 LAB — CBC WITH DIFFERENTIAL (CANCER CENTER ONLY)
Abs Immature Granulocytes: 0 10*3/uL (ref 0.00–0.07)
Basophils Absolute: 0 10*3/uL (ref 0.0–0.1)
Basophils Relative: 1 %
Eosinophils Absolute: 0.1 10*3/uL (ref 0.0–0.5)
Eosinophils Relative: 3 %
HCT: 35.2 % — ABNORMAL LOW (ref 36.0–46.0)
Hemoglobin: 12.1 g/dL (ref 12.0–15.0)
Immature Granulocytes: 0 %
Lymphocytes Relative: 40 %
Lymphs Abs: 1.1 10*3/uL (ref 0.7–4.0)
MCH: 33.6 pg (ref 26.0–34.0)
MCHC: 34.4 g/dL (ref 30.0–36.0)
MCV: 97.8 fL (ref 80.0–100.0)
Monocytes Absolute: 0.3 10*3/uL (ref 0.1–1.0)
Monocytes Relative: 12 %
Neutro Abs: 1.2 10*3/uL — ABNORMAL LOW (ref 1.7–7.7)
Neutrophils Relative %: 44 %
Platelet Count: 153 10*3/uL (ref 150–400)
RBC: 3.6 MIL/uL — ABNORMAL LOW (ref 3.87–5.11)
RDW: 16 % — ABNORMAL HIGH (ref 11.5–15.5)
WBC Count: 2.6 10*3/uL — ABNORMAL LOW (ref 4.0–10.5)
nRBC: 0 % (ref 0.0–0.2)

## 2023-12-18 MED ORDER — HEPARIN SOD (PORK) LOCK FLUSH 100 UNIT/ML IV SOLN
500.0000 [IU] | Freq: Once | INTRAVENOUS | Status: AC
  Administered 2023-12-18: 500 [IU] via INTRAVENOUS

## 2023-12-18 MED ORDER — SODIUM CHLORIDE 0.9% FLUSH
10.0000 mL | INTRAVENOUS | Status: AC | PRN
  Administered 2023-12-18: 10 mL via INTRAVENOUS

## 2023-12-18 NOTE — Progress Notes (Signed)
  [facility] OFFICE PROGRESS NOTE   Diagnosis: Uterine cancer  INTERVAL HISTORY:   [name] returns as scheduled.  She completed cycle 2 carboplatin  11/26/2023.  She had no significant nausea.  No signs of allergic reaction.  No mouth sores.  No diarrhea.  No rash.  She feels the right groin mass is about "50%" smaller.  Associated pain improved and she notes less drainage.  Objective:  Vital signs in last 24 hours:  Blood pressure 125/83, pulse 78, temperature 97.8 F (36.6 C), temperature source Temporal, resp. rate 18, height 5\' 1"  (1.549 m), weight 179 lb 8 oz (81.4 kg), SpO2 100%.    HEENT: No thrush or ulcers. Resp: Lungs clear bilaterally. Cardio: Regular rate and rhythm. GI: No hepatosplenomegaly. Vascular: Edema throughout the left leg. Skin: Fungating mass right inguinal region.  No drainage noted. Port-A-Cath without erythema.  Lab Results:  Lab Results  Component Value Date   WBC 2.6 (L) 12/18/2023   HGB 12.1 12/18/2023   HCT 35.2 (L) 12/18/2023   MCV 97.8 12/18/2023   PLT 153 12/18/2023   NEUTROABS 1.2 (L) 12/18/2023    Imaging:  No results found.  Medications: I have reviewed the patient's current medications.  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, status post a TAH, BSO, appendectomy, pelvic and periaortic lymphadenectomy, and wedge resection of a liver lesion Gross complete resection, tumor involve both ovaries, lymph nodes negative 6 cycles of adjuvant Taxol /carboplatin  CT abdomen/pelvis 03/28/2019-enlarged right inguinal and iliac nodes 04/10/2019-right inguinal lymph node biopsy-metastatic adenocarcinoma, ER 90%, PR 95%, PD-L1 combined positive score-1, MSS, tumor mutation burden-4 Cisplatin  05/16/2019 20-1 cycle Radiation to the lateral pelvis and 1 lymph nodes November 2020 CT 12/72/020-decrease size of right inguinal and external iliac lymph nodes, no evidence of disease  progression Everolimus  and letrozole  07/14/2019 - 04/06/2020 CT 10/20/2019-resolution of right iliac lymph node, decrease in size of right inguinal node CT 03/08/2020-no evidence of recurrent or metastatic disease in the abdomen or pelvis CT 05/31/2020-no evidence of recurrent or metastatic disease CT 03/11/2021-increase right inguinal lymph node, enlargement of bilateral inguinal lymph nodes, subcutaneous soft tissue nodule at the anterior right thigh Carboplatin  04/06/2021, 04/25/2021, 05/16/2021 CT 07/12/2021-mild decrease in size of bilateral inguinal lymphadenopathy, no progressive disease in the abdomen or pelvis, 0.6 cm left inguinal node was 0.8 cm previously Letrozole  08/06/2021 CT abdomen/pelvis 04/10/2022-stable right inguinal lymph node, no new site of metastatic disease CT right femur  04/10/2022 -2.2 x 1 cm anterior subcutaneous mass Letrozole  continued CT right femur 07/19/2022-mild enlargement of right thigh subcutaneous mass stable right inguinal nodal mass CTs 07/19/2022-stable right inguinal lymph node, no new or progressive disease 11/20/2022-right thigh mass larger and tender, letrozole  discontinued 11/28/2022-significant enlargement of anterior right thigh soft tissue mass, slightly larger right inguinal mass 12/25/2022-wide local excision of right thigh mass, metastatic endometrial adenocarcinoma, ER positive, PR positive, Her 2 negative(0),negative resection margins Cycle 1 pembrolizumab /lenvatinib  02/05/2023 Cycle 2 Pembrolizumab  02/27/2023, lenvatinib  placed on hold due to mucositis and hypertension Cycle 3 Pembrolizumab  03/20/2023, lenvatinib  resumed at a dose of 10 mg daily Lenvatinib  discontinued 03/30/2023 due to mucositis, hand-foot symptoms Afinitor /letrozole  04/21/2023 CTs 08/10/2023: Increased nodular tissue anterior to the right hip, new 6 mm right lung nodule CT right femur 08/10/2023: No evidence of recurrent tumor at the right anterior thigh, enlargement of right inguinal soft  tissue mass with an additional enlarging mass inferiorly Cycle 1 Carboplatin  11/06/2023 Cycle 2 carboplatin  11/26/2023, dose reduction to AUC 4 due to f  mild thrombocytopenia, home Decadron  prophylaxis Treatment held due to neutropenia 12/18/2023   2.  Left lower extremity lymphedema, improved after therapy in the lymphedema clinic   3.  Chronic diarrhea controlled with Lomotil     Disposition: Dana [name] appears stable.  She has completed 2 cycles of carboplatin .  She had no significant nausea following cycle 2 with Decadron  prophylaxis.  She is scheduled for cycle 3 today.  We reviewed the CBC.  She has moderate neutropenia.  Precautions reviewed.  She understands to contact the office with fever, chills, other signs of infection.  We are holding chemotherapy today and rescheduling for 1 week.  We discussed white cell growth factor support on day of pump discontinuation.  We reviewed potential toxicities including bone pain, rash, splenic rupture.  She will return for follow-up and treatment in 1 week.  We are available to see her sooner if needed.    Dana [name] ANP/GNP-BC   12/18/2023  8:57 AM

## 2023-12-18 NOTE — Telephone Encounter (Signed)
 I reached out to Dana [name] regarding her missed appointment at [facility] in which she was referred by Dr.Tucker.   Pt states she was a few minutes late,because of their elevator being down, to her scheduled appointment on 5/30 and they would not see her. She has declined rescheduling at this time. She states she will just ride it out for now and will call their office if she decides to be seen.

## 2023-12-18 NOTE — Addendum Note (Signed)
 Addended by: Tesean Stump S on: 12/18/2023 09:26 AM   Modules accepted: Orders

## 2023-12-19 ENCOUNTER — Encounter: Payer: Self-pay | Admitting: Oncology

## 2023-12-21 ENCOUNTER — Other Ambulatory Visit: Payer: Self-pay

## 2023-12-24 ENCOUNTER — Telehealth: Payer: Self-pay

## 2023-12-24 ENCOUNTER — Other Ambulatory Visit: Payer: Self-pay

## 2023-12-24 ENCOUNTER — Ambulatory Visit: Admitting: Oncology

## 2023-12-24 NOTE — Telephone Encounter (Signed)
 Patient called to inquire about the estimated cost of her treatment, as she will be responsible for 20% of the total expenses. I give her the billing ofc number [phone]

## 2023-12-25 ENCOUNTER — Inpatient Hospital Stay

## 2023-12-25 ENCOUNTER — Inpatient Hospital Stay (HOSPITAL_BASED_OUTPATIENT_CLINIC_OR_DEPARTMENT_OTHER): Admitting: Nurse Practitioner

## 2023-12-25 ENCOUNTER — Encounter: Payer: Self-pay | Admitting: Nurse Practitioner

## 2023-12-25 VITALS — BP 109/84 | HR 99 | Temp 98.1°F | Resp 18 | Ht 61.0 in | Wt 179.1 lb

## 2023-12-25 DIAGNOSIS — C55 Malignant neoplasm of uterus, part unspecified: Secondary | ICD-10-CM

## 2023-12-25 DIAGNOSIS — Z7189 Other specified counseling: Secondary | ICD-10-CM | POA: Diagnosis not present

## 2023-12-25 DIAGNOSIS — Z5111 Encounter for antineoplastic chemotherapy: Secondary | ICD-10-CM | POA: Diagnosis not present

## 2023-12-25 LAB — CMP (CANCER CENTER ONLY)
ALT: 11 U/L (ref 0–44)
AST: 20 U/L (ref 15–41)
Albumin: 3.9 g/dL (ref 3.5–5.0)
Alkaline Phosphatase: 75 U/L (ref 38–126)
Anion gap: 11 (ref 5–15)
BUN: 11 mg/dL (ref 8–23)
CO2: 23 mmol/L (ref 22–32)
Calcium: 8.7 mg/dL — ABNORMAL LOW (ref 8.9–10.3)
Chloride: 103 mmol/L (ref 98–111)
Creatinine: 0.85 mg/dL (ref 0.44–1.00)
GFR, Estimated: >60 mL/min (ref >60)
Glucose, Bld: 80 mg/dL (ref 70–99)
Potassium: 4.1 mmol/L (ref 3.5–5.1)
Sodium: 138 mmol/L (ref 135–145)
Total Bilirubin: 0.3 mg/dL (ref 0.0–1.2)
Total Protein: 6.2 g/dL — ABNORMAL LOW (ref 6.5–8.1)

## 2023-12-25 LAB — CBC WITH DIFFERENTIAL (CANCER CENTER ONLY)
Abs Immature Granulocytes: 0.01 10*3/uL (ref 0.00–0.07)
Basophils Absolute: 0 10*3/uL (ref 0.0–0.1)
Basophils Relative: 1 %
Eosinophils Absolute: 0.1 10*3/uL (ref 0.0–0.5)
Eosinophils Relative: 3 %
HCT: 35.3 % — ABNORMAL LOW (ref 36.0–46.0)
Hemoglobin: 12.3 g/dL (ref 12.0–15.0)
Immature Granulocytes: 0 %
Lymphocytes Relative: 26 %
Lymphs Abs: 0.9 10*3/uL (ref 0.7–4.0)
MCH: 33.9 pg (ref 26.0–34.0)
MCHC: 34.8 g/dL (ref 30.0–36.0)
MCV: 97.2 fL (ref 80.0–100.0)
Monocytes Absolute: 0.4 10*3/uL (ref 0.1–1.0)
Monocytes Relative: 12 %
Neutro Abs: 2 10*3/uL (ref 1.7–7.7)
Neutrophils Relative %: 58 %
Platelet Count: 274 10*3/uL (ref 150–400)
RBC: 3.63 MIL/uL — ABNORMAL LOW (ref 3.87–5.11)
RDW: 16.5 % — ABNORMAL HIGH (ref 11.5–15.5)
WBC Count: 3.5 10*3/uL — ABNORMAL LOW (ref 4.0–10.5)
nRBC: 0 % (ref 0.0–0.2)

## 2023-12-25 MED ORDER — HEPARIN SOD (PORK) LOCK FLUSH 100 UNIT/ML IV SOLN: 500.0000 [IU] | Freq: Once | INTRAVENOUS | Status: DC

## 2023-12-25 MED ORDER — FAMOTIDINE IN NACL 20-0.9 MG/50ML-% IV SOLN
20.0000 mg | Freq: Once | INTRAVENOUS | Status: AC
  Administered 2023-12-25: 20 mg via INTRAVENOUS
  Filled 2023-12-25: qty 50

## 2023-12-25 MED ORDER — PALONOSETRON HCL INJECTION 0.25 MG/5ML
0.2500 mg | Freq: Once | INTRAVENOUS | Status: AC
  Administered 2023-12-25: 0.25 mg via INTRAVENOUS
  Filled 2023-12-25: qty 5

## 2023-12-25 MED ORDER — SODIUM CHLORIDE 0.9 % IV SOLN
318.8500 mg | Freq: Once | INTRAVENOUS | Status: AC
  Administered 2023-12-25: 320 mg via INTRAVENOUS
  Filled 2023-12-25: qty 32

## 2023-12-25 MED ORDER — DIPHENHYDRAMINE HCL 25 MG PO CAPS
25.0000 mg | ORAL_CAPSULE | Freq: Once | ORAL | Status: AC
  Administered 2023-12-25: 25 mg via ORAL
  Filled 2023-12-25: qty 1

## 2023-12-25 MED ORDER — SODIUM CHLORIDE 0.9% FLUSH
10.0000 mL | INTRAVENOUS | Status: DC | PRN
  Administered 2023-12-25: 10 mL

## 2023-12-25 MED ORDER — SODIUM CHLORIDE 0.9 % IV SOLN
150.0000 mg | Freq: Once | INTRAVENOUS | Status: AC
  Administered 2023-12-25: 150 mg via INTRAVENOUS
  Filled 2023-12-25: qty 150

## 2023-12-25 MED ORDER — SODIUM CHLORIDE 0.9% FLUSH
10.0000 mL | INTRAVENOUS | Status: DC | PRN
  Administered 2023-12-25: 10 mL via INTRAVENOUS

## 2023-12-25 MED ORDER — DEXAMETHASONE SODIUM PHOSPHATE 10 MG/ML IJ SOLN
10.0000 mg | Freq: Once | INTRAMUSCULAR | Status: AC
  Administered 2023-12-25: 10 mg via INTRAVENOUS
  Filled 2023-12-25: qty 1

## 2023-12-25 MED ORDER — HEPARIN SOD (PORK) LOCK FLUSH 100 UNIT/ML IV SOLN
500.0000 [IU] | Freq: Once | INTRAVENOUS | Status: AC | PRN
  Administered 2023-12-25: 500 [IU]

## 2023-12-25 MED ORDER — SODIUM CHLORIDE 0.9 % IV SOLN: INTRAVENOUS | Status: DC

## 2023-12-25 NOTE — Patient Instructions (Signed)
 [facility] - A DEPT OF [facility]  Discharge Instructions: Thank you for choosing [facility] to provide your oncology and hematology care.   If you have a lab appointment with the Cancer Center, please go directly to the Cancer Center and check in at the registration area.   Wear comfortable clothing and clothing appropriate for easy access to any Portacath or PICC line.   We strive to give you quality time with your provider. You may need to reschedule your appointment if you arrive late (15 or more minutes).  Arriving late affects you and other patients whose appointments are after yours.  Also, if you miss three or more appointments without notifying the office, you may be dismissed from the clinic at the provider's discretion.      For prescription refill requests, have your pharmacy contact our office and allow 72 hours for refills to be completed.    Today you received the following chemotherapy and/or immunotherapy agents carboplatin .      To help prevent nausea and vomiting after your treatment, we encourage you to take your nausea medication as directed.  BELOW ARE SYMPTOMS THAT SHOULD BE REPORTED IMMEDIATELY: *FEVER GREATER THAN 100.4 F (38 C) OR HIGHER *CHILLS OR SWEATING *NAUSEA AND VOMITING THAT IS NOT CONTROLLED WITH YOUR NAUSEA MEDICATION *UNUSUAL SHORTNESS OF BREATH *UNUSUAL BRUISING OR BLEEDING *URINARY PROBLEMS (pain or burning when urinating, or frequent urination) *BOWEL PROBLEMS (unusual diarrhea, constipation, pain near the anus) TENDERNESS IN MOUTH AND THROAT WITH OR WITHOUT PRESENCE OF ULCERS (sore throat, sores in mouth, or a toothache) UNUSUAL RASH, SWELLING OR PAIN  UNUSUAL VAGINAL DISCHARGE OR ITCHING   Items with * indicate a potential emergency and should be followed up as soon as possible or go to the Emergency Department if any problems should occur.  Please show the CHEMOTHERAPY ALERT CARD or  IMMUNOTHERAPY ALERT CARD at check-in to the Emergency Department and triage nurse.  Should you have questions after your visit or need to cancel or reschedule your appointment, please contact [facility] - A DEPT OF [facility]  Dept: [phone]  and follow the prompts.  Office hours are 8:00 a.m. to 4:30 p.m. Monday - Friday. Please note that voicemails left after 4:00 p.m. may not be returned until the following business day.  We are closed weekends and major holidays. You have access to a nurse at all times for urgent questions. Please call the main number to the clinic Dept: [phone] and follow the prompts.   For any non-urgent questions, you may also contact your provider using MyChart. We now offer e-Visits for anyone 5 and older to request care online for non-urgent symptoms. For details visit [email or web address].   Also download the MyChart app! Go to the app store, search "MyChart", open the app, select , and log in with your MyChart username and password.  Carboplatin  Injection What is this medication? CARBOPLATIN  (KAR boe pla tin) treats some types of cancer. It works by slowing down the growth of cancer cells. This medicine may be used for other purposes; ask your health care provider or pharmacist if you have questions. COMMON BRAND NAME(S): Paraplatin  What should I tell my care team before I take this medication? They need to know if you have any of these conditions: Blood disorders Hearing problems Kidney disease Recent or ongoing radiation therapy An unusual or allergic reaction to carboplatin , cisplatin , other medications, foods, dyes, or  preservatives Pregnant or trying to get pregnant Breast-feeding How should I use this medication? This medication is injected into a vein. It is given by your care team in a hospital or clinic setting. Talk to your care team about the use of this medication in children. Special care may  be needed. Overdosage: If you think you have taken too much of this medicine contact a poison control center or emergency room at once. NOTE: This medicine is only for you. Do not share this medicine with others. What if I miss a dose? Keep appointments for follow-up doses. It is important not to miss your dose. Call your care team if you are unable to keep an appointment. What may interact with this medication? Medications for seizures Some antibiotics, such as amikacin, gentamicin, neomycin, streptomycin, tobramycin Vaccines This list may not describe all possible interactions. Give your health care provider a list of all the medicines, herbs, non-prescription drugs, or dietary supplements you use. Also tell them if you smoke, drink alcohol , or use illegal drugs. Some items may interact with your medicine. What should I watch for while using this medication? Your condition will be monitored carefully while you are receiving this medication. You may need blood work while taking this medication. This medication may make you feel generally unwell. This is not uncommon, as chemotherapy can affect healthy cells as well as cancer cells. Report any side effects. Continue your course of treatment even though you feel ill unless your care team tells you to stop. In some cases, you may be given additional medications to help with side effects. Follow all directions for their use. This medication may increase your risk of getting an infection. Call your care team for advice if you get a fever, chills, sore throat, or other symptoms of a cold or flu. Do not treat yourself. Try to avoid being around people who are sick. Avoid taking medications that contain aspirin , acetaminophen , ibuprofen, naproxen , or ketoprofen unless instructed by your care team. These medications may hide a fever. Be careful brushing or flossing your teeth or using a toothpick because you may get an infection or bleed more easily. If you  have any dental work done, tell your dentist you are receiving this medication. Talk to your care team if you wish to become pregnant or think you might be pregnant. This medication can cause serious birth defects. Talk to your care team about effective forms of contraception. Do not breast-feed while taking this medication. What side effects may I notice from receiving this medication? Side effects that you should report to your care team as soon as possible: Allergic reactions--skin rash, itching, hives, swelling of the face, lips, tongue, or throat Infection--fever, chills, cough, sore throat, wounds that don't heal, pain or trouble when passing urine, general feeling of discomfort or being unwell Low red blood cell level--unusual weakness or fatigue, dizziness, headache, trouble breathing Pain, tingling, or numbness in the hands or feet, muscle weakness, change in vision, confusion or trouble speaking, loss of balance or coordination, trouble walking, seizures Unusual bruising or bleeding Side effects that usually do not require medical attention (report to your care team if they continue or are bothersome): Hair loss Nausea Unusual weakness or fatigue Vomiting This list may not describe all possible side effects. Call your doctor for medical advice about side effects. You may report side effects to FDA at 1-800-FDA-1088. Where should I keep my medication? This medication is given in a hospital or clinic. It will not  be stored at home. NOTE: This sheet is a summary. It may not cover all possible information. If you have questions about this medicine, talk to your doctor, pharmacist, or health care provider.  2024 Elsevier/Gold Standard (2021-10-25 00:00:00)

## 2023-12-25 NOTE — Progress Notes (Signed)
 [facility] OFFICE PROGRESS NOTE   Diagnosis: Uterine cancer  INTERVAL HISTORY:   Dana [name] returns as scheduled.  She completed cycle 2 carboplatin  11/26/2023.  Cycle 3 was held last week due to neutropenia.  She feels the right groin mass is smaller again.  She denies nausea/vomiting.  No mouth sores.  No diarrhea.  No rash.  Objective:  Vital signs in last 24 hours:  Blood pressure 109/84, pulse 99, temperature 98.1 F (36.7 C), temperature source Temporal, resp. rate 18, height 5\' 1"  (1.549 m), weight 179 lb 1.6 oz (81.2 kg), SpO2 100%.    HEENT: No thrush or ulcers. Resp: Lungs clear bilaterally. Cardio: Regular rate and rhythm. GI: No hepatosplenomegaly. Vascular: Edema throughout the left leg. Skin: Persistent fungating mass right inguinal region.  No drainage noted. Port-A-Cath without erythema.  Lab Results:  Lab Results  Component Value Date   WBC 3.5 (L) 12/25/2023   HGB 12.3 12/25/2023   HCT 35.3 (L) 12/25/2023   MCV 97.2 12/25/2023   PLT 274 12/25/2023   NEUTROABS 2.0 12/25/2023    Imaging:  No results found.  Medications: I have reviewed the patient's current medications.  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, status post a TAH, BSO, appendectomy, pelvic and periaortic lymphadenectomy, and wedge resection of a liver lesion Gross complete resection, tumor involve both ovaries, lymph nodes negative 6 cycles of adjuvant Taxol /carboplatin  CT abdomen/pelvis 03/28/2019-enlarged right inguinal and iliac nodes 04/10/2019-right inguinal lymph node biopsy-metastatic adenocarcinoma, ER 90%, PR 95%, PD-L1 combined positive score-1, MSS, tumor mutation burden-4 Cisplatin  05/16/2019 20-1 cycle Radiation to the lateral pelvis and 1 lymph nodes November 2020 CT 12/72/020-decrease size of right inguinal and external iliac lymph nodes, no evidence of disease progression Everolimus  and letrozole   07/14/2019 - 04/06/2020 CT 10/20/2019-resolution of right iliac lymph node, decrease in size of right inguinal node CT 03/08/2020-no evidence of recurrent or metastatic disease in the abdomen or pelvis CT 05/31/2020-no evidence of recurrent or metastatic disease CT 03/11/2021-increase right inguinal lymph node, enlargement of bilateral inguinal lymph nodes, subcutaneous soft tissue nodule at the anterior right thigh Carboplatin  04/06/2021, 04/25/2021, 05/16/2021 CT 07/12/2021-mild decrease in size of bilateral inguinal lymphadenopathy, no progressive disease in the abdomen or pelvis, 0.6 cm left inguinal node was 0.8 cm previously Letrozole  08/06/2021 CT abdomen/pelvis 04/10/2022-stable right inguinal lymph node, no new site of metastatic disease CT right femur  04/10/2022 -2.2 x 1 cm anterior subcutaneous mass Letrozole  continued CT right femur 07/19/2022-mild enlargement of right thigh subcutaneous mass stable right inguinal nodal mass CTs 07/19/2022-stable right inguinal lymph node, no new or progressive disease 11/20/2022-right thigh mass larger and tender, letrozole  discontinued 11/28/2022-significant enlargement of anterior right thigh soft tissue mass, slightly larger right inguinal mass 12/25/2022-wide local excision of right thigh mass, metastatic endometrial adenocarcinoma, ER positive, PR positive, Her 2 negative(0),negative resection margins Cycle 1 pembrolizumab /lenvatinib  02/05/2023 Cycle 2 Pembrolizumab  02/27/2023, lenvatinib  placed on hold due to mucositis and hypertension Cycle 3 Pembrolizumab  03/20/2023, lenvatinib  resumed at a dose of 10 mg daily Lenvatinib  discontinued 03/30/2023 due to mucositis, hand-foot symptoms Afinitor /letrozole  04/21/2023 CTs 08/10/2023: Increased nodular tissue anterior to the right hip, new 6 mm right lung nodule CT right femur 08/10/2023: No evidence of recurrent tumor at the right anterior thigh, enlargement of right inguinal soft tissue mass with an additional enlarging  mass inferiorly Cycle 1 Carboplatin  11/06/2023 Cycle 2 carboplatin  11/26/2023, dose reduction to AUC 4 due to f mild thrombocytopenia, home Decadron  prophylaxis Treatment held due  to neutropenia 12/18/2023 Cycle 3 carboplatin  12/25/2023, dose reduction due to neutropenia following cycle 2, patient declines white cell growth factor support   2.  Left lower extremity lymphedema, improved after therapy in the lymphedema clinic   3.  Chronic diarrhea controlled with Lomotil     Disposition: [name] appears stable.  She has completed 2 cycles of carboplatin .  Cycle 3 was held last week due to neutropenia.  We reviewed the CBC from today.  The ANC is in low normal range.  We had previously discussed white cell growth factor support.  She declines white cell growth factor support due to financial constraints.  Plan to proceed with cycle 3 carboplatin  today as scheduled with a dose reduction.  Benadryl  premedication dose reduced from 50 mg to 25 mg due to patient request.  We discussed restaging CTs prior to the next office visit.  She declines due to the cost.    She will return for follow-up and treatment in 3 weeks.  We are available to see her sooner if needed.  Plan reviewed with Dr. Scherrie Curt.      Dana [name] ANP/GNP-BC   12/25/2023  10:17 AM

## 2023-12-25 NOTE — Progress Notes (Signed)
 Patient seen by Diana Forster NP today  Vitals are within treatment parameters:Yes   Labs are within treatment parameters: Labs pending  Treatment plan has been signed: Yes   Per physician team, Patient is ready for treatment. Please note the following modifications: Dose reduce carboplatin  and decreased benadryl  to 25 mg

## 2023-12-26 ENCOUNTER — Encounter: Payer: Self-pay | Admitting: Oncology

## 2023-12-26 ENCOUNTER — Other Ambulatory Visit: Payer: Self-pay

## 2023-12-26 ENCOUNTER — Ambulatory Visit

## 2023-12-27 ENCOUNTER — Ambulatory Visit

## 2023-12-30 ENCOUNTER — Other Ambulatory Visit: Payer: Self-pay

## 2024-01-07 ENCOUNTER — Other Ambulatory Visit

## 2024-01-07 ENCOUNTER — Ambulatory Visit

## 2024-01-07 ENCOUNTER — Ambulatory Visit: Admitting: Oncology

## 2024-01-07 ENCOUNTER — Other Ambulatory Visit: Payer: Self-pay

## 2024-01-08 ENCOUNTER — Telehealth: Payer: Self-pay | Admitting: *Deleted

## 2024-01-08 NOTE — Telephone Encounter (Addendum)
 Ms. Agostinelli reports that her neuropathy has become much worse beginning day 3 after her 6/10 carboplatin . Feet/legs are burning and throbbing. She had blisters on her feet as well that have now resolved. Hands/arms feel heavy and numb. Had developed right axillary pain, but it has improved, but when she was able to raise her arm she noted that her right axilla/lateral breast/torso is discolored as if bruised.  Asking if MD/NP could see her tomorrow? Per Dr. Cloretta: See NP on 6/25 or 6/26. High priority scheduling message sent.

## 2024-01-09 NOTE — Progress Notes (Deleted)
 SABRA

## 2024-01-10 ENCOUNTER — Other Ambulatory Visit: Payer: Self-pay | Admitting: Oncology

## 2024-01-10 ENCOUNTER — Telehealth: Payer: Self-pay

## 2024-01-10 ENCOUNTER — Inpatient Hospital Stay: Admitting: Nurse Practitioner

## 2024-01-10 NOTE — Telephone Encounter (Signed)
 The patient contacted the office to inform us  that she does not require an appointment at this time. The discoloration under her arm is resolving, and she will wait until her scheduled follow-up on the 30th to be evaluated.

## 2024-01-12 ENCOUNTER — Other Ambulatory Visit: Payer: Self-pay

## 2024-01-14 ENCOUNTER — Encounter: Payer: Self-pay | Admitting: Nurse Practitioner

## 2024-01-14 ENCOUNTER — Inpatient Hospital Stay: Admitting: Nurse Practitioner

## 2024-01-14 ENCOUNTER — Inpatient Hospital Stay

## 2024-01-14 VITALS — BP 128/89 | HR 84 | Temp 98.1°F | Resp 18 | Wt 179.5 lb

## 2024-01-14 DIAGNOSIS — Z7189 Other specified counseling: Secondary | ICD-10-CM

## 2024-01-14 DIAGNOSIS — C55 Malignant neoplasm of uterus, part unspecified: Secondary | ICD-10-CM

## 2024-01-14 DIAGNOSIS — Z5111 Encounter for antineoplastic chemotherapy: Secondary | ICD-10-CM | POA: Diagnosis not present

## 2024-01-14 LAB — CBC WITH DIFFERENTIAL (CANCER CENTER ONLY)
Abs Immature Granulocytes: 0.01 10*3/uL (ref 0.00–0.07)
Basophils Absolute: 0 10*3/uL (ref 0.0–0.1)
Basophils Relative: 0 %
Eosinophils Absolute: 0.1 10*3/uL (ref 0.0–0.5)
Eosinophils Relative: 2 %
HCT: 35.7 % — ABNORMAL LOW (ref 36.0–46.0)
Hemoglobin: 12.3 g/dL (ref 12.0–15.0)
Immature Granulocytes: 0 %
Lymphocytes Relative: 28 %
Lymphs Abs: 1.1 10*3/uL (ref 0.7–4.0)
MCH: 34.7 pg — ABNORMAL HIGH (ref 26.0–34.0)
MCHC: 34.5 g/dL (ref 30.0–36.0)
MCV: 100.8 fL — ABNORMAL HIGH (ref 80.0–100.0)
Monocytes Absolute: 0.4 10*3/uL (ref 0.1–1.0)
Monocytes Relative: 10 %
Neutro Abs: 2.3 10*3/uL (ref 1.7–7.7)
Neutrophils Relative %: 60 %
Platelet Count: 207 10*3/uL (ref 150–400)
RBC: 3.54 MIL/uL — ABNORMAL LOW (ref 3.87–5.11)
RDW: 17.2 % — ABNORMAL HIGH (ref 11.5–15.5)
WBC Count: 3.8 10*3/uL — ABNORMAL LOW (ref 4.0–10.5)
nRBC: 0 % (ref 0.0–0.2)

## 2024-01-14 LAB — CMP (CANCER CENTER ONLY)
ALT: 11 U/L (ref 0–44)
AST: 19 U/L (ref 15–41)
Albumin: 3.8 g/dL (ref 3.5–5.0)
Alkaline Phosphatase: 69 U/L (ref 38–126)
Anion gap: 9 (ref 5–15)
BUN: 9 mg/dL (ref 8–23)
CO2: 24 mmol/L (ref 22–32)
Calcium: 8.7 mg/dL — ABNORMAL LOW (ref 8.9–10.3)
Chloride: 103 mmol/L (ref 98–111)
Creatinine: 0.8 mg/dL (ref 0.44–1.00)
GFR, Estimated: >60 mL/min (ref >60)
Glucose, Bld: 91 mg/dL (ref 70–99)
Potassium: 4 mmol/L (ref 3.5–5.1)
Sodium: 136 mmol/L (ref 135–145)
Total Bilirubin: 0.4 mg/dL (ref 0.0–1.2)
Total Protein: 6.2 g/dL — ABNORMAL LOW (ref 6.5–8.1)

## 2024-01-14 MED ORDER — FAMOTIDINE IN NACL 20-0.9 MG/50ML-% IV SOLN
20.0000 mg | Freq: Once | INTRAVENOUS | Status: AC
  Administered 2024-01-14: 20 mg via INTRAVENOUS
  Filled 2024-01-14: qty 50

## 2024-01-14 MED ORDER — SODIUM CHLORIDE 0.9 % IV SOLN: INTRAVENOUS | Status: DC

## 2024-01-14 MED ORDER — DEXAMETHASONE SODIUM PHOSPHATE 10 MG/ML IJ SOLN
10.0000 mg | Freq: Once | INTRAMUSCULAR | Status: AC
  Administered 2024-01-14: 10 mg via INTRAVENOUS
  Filled 2024-01-14: qty 1

## 2024-01-14 MED ORDER — DIPHENHYDRAMINE HCL 25 MG PO CAPS
25.0000 mg | ORAL_CAPSULE | Freq: Once | ORAL | Status: AC
  Administered 2024-01-14: 25 mg via ORAL
  Filled 2024-01-14: qty 1

## 2024-01-14 MED ORDER — SODIUM CHLORIDE 0.9 % IV SOLN
318.8500 mg | Freq: Once | INTRAVENOUS | Status: AC
  Administered 2024-01-14: 320 mg via INTRAVENOUS
  Filled 2024-01-14: qty 32

## 2024-01-14 MED ORDER — HEPARIN SOD (PORK) LOCK FLUSH 100 UNIT/ML IV SOLN
500.0000 [IU] | Freq: Once | INTRAVENOUS | Status: AC | PRN
  Administered 2024-01-14: 500 [IU]

## 2024-01-14 MED ORDER — SODIUM CHLORIDE 0.9 % IV SOLN
150.0000 mg | Freq: Once | INTRAVENOUS | Status: AC
  Administered 2024-01-14: 150 mg via INTRAVENOUS
  Filled 2024-01-14: qty 150

## 2024-01-14 MED ORDER — PALONOSETRON HCL INJECTION 0.25 MG/5ML
0.2500 mg | Freq: Once | INTRAVENOUS | Status: AC
  Administered 2024-01-14: 0.25 mg via INTRAVENOUS
  Filled 2024-01-14: qty 5

## 2024-01-14 MED ORDER — SODIUM CHLORIDE 0.9% FLUSH
10.0000 mL | INTRAVENOUS | Status: DC | PRN
  Administered 2024-01-14: 10 mL

## 2024-01-14 NOTE — Patient Instructions (Signed)
 [facility] - A DEPT OF [facility]  Discharge Instructions: Thank you for choosing [facility] to provide your oncology and hematology care.   If you have a lab appointment with the Cancer Center, please go directly to the Cancer Center and check in at the registration area.   Wear comfortable clothing and clothing appropriate for easy access to any Portacath or PICC line.   We strive to give you quality time with your provider. You may need to reschedule your appointment if you arrive late (15 or more minutes).  Arriving late affects you and other patients whose appointments are after yours.  Also, if you miss three or more appointments without notifying the office, you may be dismissed from the clinic at the provider's discretion.      For prescription refill requests, have your pharmacy contact our office and allow 72 hours for refills to be completed.    Today you received the following chemotherapy and/or immunotherapy agents carboplatin .      To help prevent nausea and vomiting after your treatment, we encourage you to take your nausea medication as directed.  BELOW ARE SYMPTOMS THAT SHOULD BE REPORTED IMMEDIATELY: *FEVER GREATER THAN 100.4 F (38 C) OR HIGHER *CHILLS OR SWEATING *NAUSEA AND VOMITING THAT IS NOT CONTROLLED WITH YOUR NAUSEA MEDICATION *UNUSUAL SHORTNESS OF BREATH *UNUSUAL BRUISING OR BLEEDING *URINARY PROBLEMS (pain or burning when urinating, or frequent urination) *BOWEL PROBLEMS (unusual diarrhea, constipation, pain near the anus) TENDERNESS IN MOUTH AND THROAT WITH OR WITHOUT PRESENCE OF ULCERS (sore throat, sores in mouth, or a toothache) UNUSUAL RASH, SWELLING OR PAIN  UNUSUAL VAGINAL DISCHARGE OR ITCHING   Items with * indicate a potential emergency and should be followed up as soon as possible or go to the Emergency Department if any problems should occur.  Please show the CHEMOTHERAPY ALERT CARD or  IMMUNOTHERAPY ALERT CARD at check-in to the Emergency Department and triage nurse.  Should you have questions after your visit or need to cancel or reschedule your appointment, please contact [facility] - A DEPT OF [facility]  Dept: [phone]  and follow the prompts.  Office hours are 8:00 a.m. to 4:30 p.m. Monday - Friday. Please note that voicemails left after 4:00 p.m. may not be returned until the following business day.  We are closed weekends and major holidays. You have access to a nurse at all times for urgent questions. Please call the main number to the clinic Dept: [phone] and follow the prompts.   For any non-urgent questions, you may also contact your provider using MyChart. We now offer e-Visits for anyone 5 and older to request care online for non-urgent symptoms. For details visit [email or web address].   Also download the MyChart app! Go to the app store, search "MyChart", open the app, select , and log in with your MyChart username and password.  Carboplatin  Injection What is this medication? CARBOPLATIN  (KAR boe pla tin) treats some types of cancer. It works by slowing down the growth of cancer cells. This medicine may be used for other purposes; ask your health care provider or pharmacist if you have questions. COMMON BRAND NAME(S): Paraplatin  What should I tell my care team before I take this medication? They need to know if you have any of these conditions: Blood disorders Hearing problems Kidney disease Recent or ongoing radiation therapy An unusual or allergic reaction to carboplatin , cisplatin , other medications, foods, dyes, or  preservatives Pregnant or trying to get pregnant Breast-feeding How should I use this medication? This medication is injected into a vein. It is given by your care team in a hospital or clinic setting. Talk to your care team about the use of this medication in children. Special care may  be needed. Overdosage: If you think you have taken too much of this medicine contact a poison control center or emergency room at once. NOTE: This medicine is only for you. Do not share this medicine with others. What if I miss a dose? Keep appointments for follow-up doses. It is important not to miss your dose. Call your care team if you are unable to keep an appointment. What may interact with this medication? Medications for seizures Some antibiotics, such as amikacin, gentamicin, neomycin, streptomycin, tobramycin Vaccines This list may not describe all possible interactions. Give your health care provider a list of all the medicines, herbs, non-prescription drugs, or dietary supplements you use. Also tell them if you smoke, drink alcohol , or use illegal drugs. Some items may interact with your medicine. What should I watch for while using this medication? Your condition will be monitored carefully while you are receiving this medication. You may need blood work while taking this medication. This medication may make you feel generally unwell. This is not uncommon, as chemotherapy can affect healthy cells as well as cancer cells. Report any side effects. Continue your course of treatment even though you feel ill unless your care team tells you to stop. In some cases, you may be given additional medications to help with side effects. Follow all directions for their use. This medication may increase your risk of getting an infection. Call your care team for advice if you get a fever, chills, sore throat, or other symptoms of a cold or flu. Do not treat yourself. Try to avoid being around people who are sick. Avoid taking medications that contain aspirin , acetaminophen , ibuprofen, naproxen , or ketoprofen unless instructed by your care team. These medications may hide a fever. Be careful brushing or flossing your teeth or using a toothpick because you may get an infection or bleed more easily. If you  have any dental work done, tell your dentist you are receiving this medication. Talk to your care team if you wish to become pregnant or think you might be pregnant. This medication can cause serious birth defects. Talk to your care team about effective forms of contraception. Do not breast-feed while taking this medication. What side effects may I notice from receiving this medication? Side effects that you should report to your care team as soon as possible: Allergic reactions--skin rash, itching, hives, swelling of the face, lips, tongue, or throat Infection--fever, chills, cough, sore throat, wounds that don't heal, pain or trouble when passing urine, general feeling of discomfort or being unwell Low red blood cell level--unusual weakness or fatigue, dizziness, headache, trouble breathing Pain, tingling, or numbness in the hands or feet, muscle weakness, change in vision, confusion or trouble speaking, loss of balance or coordination, trouble walking, seizures Unusual bruising or bleeding Side effects that usually do not require medical attention (report to your care team if they continue or are bothersome): Hair loss Nausea Unusual weakness or fatigue Vomiting This list may not describe all possible side effects. Call your doctor for medical advice about side effects. You may report side effects to FDA at 1-800-FDA-1088. Where should I keep my medication? This medication is given in a hospital or clinic. It will not  be stored at home. NOTE: This sheet is a summary. It may not cover all possible information. If you have questions about this medicine, talk to your doctor, pharmacist, or health care provider.  2024 Elsevier/Gold Standard (2021-10-25 00:00:00)

## 2024-01-14 NOTE — Progress Notes (Signed)
 [facility] OFFICE PROGRESS NOTE   Diagnosis: Uterine cancer  INTERVAL HISTORY:   Dana [name] returns as scheduled.  She completed cycle 3 carboplatin  12/25/2023.  Carboplatin  was dose reduced due to neutropenia, patient declined white cell growth factor support.  She had mild nausea.  No vomiting.  No mouth sores.  No diarrhea.  Around day 3-4 she developed pain and a blue discoloration right upper arm/axilla.  Also during this timeframe burning and painful blisters on the feet.  Hands were achy.  She notes increased numbness tingling in the feet at nighttime.  Objective:  Vital signs in last 24 hours:  Blood pressure 128/89, pulse 84, temperature 98.1 F (36.7 C), temperature source Temporal, resp. rate 18, weight 179 lb 8 oz (81.4 kg), SpO2 100%.    HEENT: No thrush or ulcers. Lymphatics: No right axillary adenopathy. Resp: Lungs clear bilaterally. Cardio: Regular rate and rhythm. GI: No hepatosplenomegaly. Vascular: Edema throughout the left leg. Skin: Palms without erythema.  Soles are dry appearing.  Resolving ecchymosis upper inner right arm and right lateral chest wall. Port-A-Cath without erythema.  Lab Results:  Lab Results  Component Value Date   WBC 3.8 (L) 01/14/2024   HGB 12.3 01/14/2024   HCT 35.7 (L) 01/14/2024   MCV 100.8 (H) 01/14/2024   PLT 207 01/14/2024   NEUTROABS 2.3 01/14/2024    Imaging:  No results found.  Medications: I have reviewed the patient's current medications.  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, status post a TAH, BSO, appendectomy, pelvic and periaortic lymphadenectomy, and wedge resection of a liver lesion Gross complete resection, tumor involve both ovaries, lymph nodes negative 6 cycles of adjuvant Taxol /carboplatin  CT abdomen/pelvis 03/28/2019-enlarged right inguinal and iliac nodes 04/10/2019-right inguinal lymph node biopsy-metastatic adenocarcinoma, ER  90%, PR 95%, PD-L1 combined positive score-1, MSS, tumor mutation burden-4 Cisplatin  05/16/2019 20-1 cycle Radiation to the lateral pelvis and 1 lymph nodes November 2020 CT 12/72/020-decrease size of right inguinal and external iliac lymph nodes, no evidence of disease progression Everolimus  and letrozole  07/14/2019 - 04/06/2020 CT 10/20/2019-resolution of right iliac lymph node, decrease in size of right inguinal node CT 03/08/2020-no evidence of recurrent or metastatic disease in the abdomen or pelvis CT 05/31/2020-no evidence of recurrent or metastatic disease CT 03/11/2021-increase right inguinal lymph node, enlargement of bilateral inguinal lymph nodes, subcutaneous soft tissue nodule at the anterior right thigh Carboplatin  04/06/2021, 04/25/2021, 05/16/2021 CT 07/12/2021-mild decrease in size of bilateral inguinal lymphadenopathy, no progressive disease in the abdomen or pelvis, 0.6 cm left inguinal node was 0.8 cm previously Letrozole  08/06/2021 CT abdomen/pelvis 04/10/2022-stable right inguinal lymph node, no new site of metastatic disease CT right femur  04/10/2022 -2.2 x 1 cm anterior subcutaneous mass Letrozole  continued CT right femur 07/19/2022-mild enlargement of right thigh subcutaneous mass stable right inguinal nodal mass CTs 07/19/2022-stable right inguinal lymph node, no new or progressive disease 11/20/2022-right thigh mass larger and tender, letrozole  discontinued 11/28/2022-significant enlargement of anterior right thigh soft tissue mass, slightly larger right inguinal mass 12/25/2022-wide local excision of right thigh mass, metastatic endometrial adenocarcinoma, ER positive, PR positive, Her 2 negative(0),negative resection margins Cycle 1 pembrolizumab /lenvatinib  02/05/2023 Cycle 2 Pembrolizumab  02/27/2023, lenvatinib  placed on hold due to mucositis and hypertension Cycle 3 Pembrolizumab  03/20/2023, lenvatinib  resumed at a dose of 10 mg daily Lenvatinib  discontinued 03/30/2023 due to  mucositis, hand-foot symptoms Afinitor /letrozole  04/21/2023 CTs 08/10/2023: Increased nodular tissue anterior to the right hip, new 6 mm right lung nodule CT right femur  08/10/2023: No evidence of recurrent tumor at the right anterior thigh, enlargement of right inguinal soft tissue mass with an additional enlarging mass inferiorly Cycle 1 Carboplatin  11/06/2023 Cycle 2 carboplatin  11/26/2023, dose reduction to AUC 4 due to f mild thrombocytopenia, home Decadron  prophylaxis Treatment held due to neutropenia 12/18/2023 Cycle 3 carboplatin  12/25/2023, dose reduction due to neutropenia following cycle 2, patient declines white cell growth factor support Cycle 4 carboplatin  01/14/2024   2.  Left lower extremity lymphedema, improved after therapy in the lymphedema clinic   3.  Chronic diarrhea controlled with Lomotil     Disposition: Ms. Bucy appears stable.  She has completed 3 cycles of carboplatin .  Around day 3 or 4 she developed pain and blisters on the feet and a large ecchymosis right upper inner arm and axilla.  Etiology unclear, would be unusual side effects of carboplatin .  She would like to proceed with cycle 4 today as scheduled and then restaging CTs in 2 weeks.  She is willing to accept the risk of recurrence of the above.  CBC and chemistry panel reviewed.  Labs adequate for treatment.  She will return for follow-up and treatment in 3 weeks.  We are available to see her sooner if needed.    Olam Ned ANP/GNP-BC   01/14/2024  9:33 AM

## 2024-01-14 NOTE — Progress Notes (Signed)
 Patient seen by Olam Ned NP today  Vitals are within treatment parameters:Yes   Labs are within treatment parameters: Yes   Treatment plan has been signed: Yes   Per physician team, Patient is ready for treatment. Please note the following modifications: No growth factor injection.

## 2024-01-15 ENCOUNTER — Other Ambulatory Visit: Payer: Self-pay

## 2024-01-15 ENCOUNTER — Inpatient Hospital Stay: Payer: Self-pay

## 2024-01-23 ENCOUNTER — Telehealth: Payer: Self-pay | Admitting: *Deleted

## 2024-01-23 NOTE — Telephone Encounter (Signed)
 Patient called to report her CT scans were scheduled for 7/22, which is day after her OV/chemo. Called central scheduling and was set up for 7/13 and patient agrees. She is aware.

## 2024-01-24 ENCOUNTER — Encounter: Payer: Self-pay | Admitting: Oncology

## 2024-01-25 ENCOUNTER — Telehealth: Payer: Self-pay | Admitting: *Deleted

## 2024-01-25 DIAGNOSIS — Z1331 Encounter for screening for depression: Secondary | ICD-10-CM | POA: Diagnosis not present

## 2024-01-25 DIAGNOSIS — E039 Hypothyroidism, unspecified: Secondary | ICD-10-CM | POA: Diagnosis not present

## 2024-01-25 DIAGNOSIS — Z1389 Encounter for screening for other disorder: Secondary | ICD-10-CM | POA: Diagnosis not present

## 2024-01-25 NOTE — Telephone Encounter (Signed)
 Called Dana [name] and made her aware that CT femur still has not been approved. She prefers to cancel both scans on Sunday and will reschedule when PA is done. This will avoid two copays. Notified central scheduling to cancel.

## 2024-01-27 ENCOUNTER — Ambulatory Visit (HOSPITAL_BASED_OUTPATIENT_CLINIC_OR_DEPARTMENT_OTHER)

## 2024-01-29 ENCOUNTER — Telehealth: Payer: Self-pay | Admitting: *Deleted

## 2024-01-29 NOTE — Telephone Encounter (Signed)
 Informed [name] that the femur CT has now been approved as well. Provided her the phone number to central scheduling to call and schedule the scans.

## 2024-02-01 ENCOUNTER — Other Ambulatory Visit: Payer: Self-pay | Admitting: Oncology

## 2024-02-03 ENCOUNTER — Ambulatory Visit (HOSPITAL_BASED_OUTPATIENT_CLINIC_OR_DEPARTMENT_OTHER)
Admission: RE | Admit: 2024-02-03 | Discharge: 2024-02-03 | Disposition: A | Discharge status: Home / Self Care | Source: Ambulatory Visit | Attending: Nurse Practitioner | Admitting: Nurse Practitioner

## 2024-02-03 DIAGNOSIS — C55 Malignant neoplasm of uterus, part unspecified: Secondary | ICD-10-CM | POA: Diagnosis not present

## 2024-02-03 DIAGNOSIS — K7689 Other specified diseases of liver: Secondary | ICD-10-CM | POA: Diagnosis not present

## 2024-02-03 DIAGNOSIS — R600 Localized edema: Secondary | ICD-10-CM | POA: Diagnosis not present

## 2024-02-03 DIAGNOSIS — N281 Cyst of kidney, acquired: Secondary | ICD-10-CM | POA: Diagnosis not present

## 2024-02-03 DIAGNOSIS — K5730 Diverticulosis of large intestine without perforation or abscess without bleeding: Secondary | ICD-10-CM | POA: Diagnosis not present

## 2024-02-03 DIAGNOSIS — C541 Malignant neoplasm of endometrium: Secondary | ICD-10-CM | POA: Diagnosis not present

## 2024-02-03 MED ORDER — IOHEXOL 300 MG/ML  SOLN
100.0000 mL | Freq: Once | INTRAMUSCULAR | Status: AC | PRN
  Administered 2024-02-03: 100 mL via INTRAVENOUS

## 2024-02-04 ENCOUNTER — Inpatient Hospital Stay: Attending: Hematology and Oncology

## 2024-02-04 ENCOUNTER — Inpatient Hospital Stay: Admitting: Oncology

## 2024-02-04 ENCOUNTER — Inpatient Hospital Stay: Payer: Self-pay

## 2024-02-04 VITALS — BP 131/82 | HR 80 | Temp 98.2°F | Resp 18 | Ht 61.0 in | Wt 180.0 lb

## 2024-02-04 DIAGNOSIS — Z95828 Presence of other vascular implants and grafts: Secondary | ICD-10-CM

## 2024-02-04 DIAGNOSIS — C55 Malignant neoplasm of uterus, part unspecified: Secondary | ICD-10-CM

## 2024-02-04 DIAGNOSIS — R197 Diarrhea, unspecified: Secondary | ICD-10-CM | POA: Insufficient documentation

## 2024-02-04 DIAGNOSIS — C541 Malignant neoplasm of endometrium: Secondary | ICD-10-CM | POA: Insufficient documentation

## 2024-02-04 DIAGNOSIS — C774 Secondary and unspecified malignant neoplasm of inguinal and lower limb lymph nodes: Secondary | ICD-10-CM | POA: Insufficient documentation

## 2024-02-04 DIAGNOSIS — Z7189 Other specified counseling: Secondary | ICD-10-CM

## 2024-02-04 LAB — CBC WITH DIFFERENTIAL (CANCER CENTER ONLY)
Abs Immature Granulocytes: 0.01 K/uL (ref 0.00–0.07)
Basophils Absolute: 0 K/uL (ref 0.0–0.1)
Basophils Relative: 1 %
Eosinophils Absolute: 0.1 K/uL (ref 0.0–0.5)
Eosinophils Relative: 2 %
HCT: 34.8 % — ABNORMAL LOW (ref 36.0–46.0)
Hemoglobin: 11.9 g/dL — ABNORMAL LOW (ref 12.0–15.0)
Immature Granulocytes: 0 %
Lymphocytes Relative: 30 %
Lymphs Abs: 1.2 K/uL (ref 0.7–4.0)
MCH: 34.7 pg — ABNORMAL HIGH (ref 26.0–34.0)
MCHC: 34.2 g/dL (ref 30.0–36.0)
MCV: 101.5 fL — ABNORMAL HIGH (ref 80.0–100.0)
Monocytes Absolute: 0.5 K/uL (ref 0.1–1.0)
Monocytes Relative: 12 %
Neutro Abs: 2.1 K/uL (ref 1.7–7.7)
Neutrophils Relative %: 55 %
Platelet Count: 183 K/uL (ref 150–400)
RBC: 3.43 MIL/uL — ABNORMAL LOW (ref 3.87–5.11)
RDW: 16.3 % — ABNORMAL HIGH (ref 11.5–15.5)
WBC Count: 3.8 K/uL — ABNORMAL LOW (ref 4.0–10.5)
nRBC: 0 % (ref 0.0–0.2)

## 2024-02-04 LAB — CMP (CANCER CENTER ONLY)
ALT: 14 U/L (ref 0–44)
AST: 20 U/L (ref 15–41)
Albumin: 3.9 g/dL (ref 3.5–5.0)
Alkaline Phosphatase: 71 U/L (ref 38–126)
Anion gap: 11 (ref 5–15)
BUN: 11 mg/dL (ref 8–23)
CO2: 24 mmol/L (ref 22–32)
Calcium: 9.2 mg/dL (ref 8.9–10.3)
Chloride: 102 mmol/L (ref 98–111)
Creatinine: 0.8 mg/dL (ref 0.44–1.00)
GFR, Estimated: >60 mL/min (ref >60)
Glucose, Bld: 90 mg/dL (ref 70–99)
Potassium: 4 mmol/L (ref 3.5–5.1)
Sodium: 138 mmol/L (ref 135–145)
Total Bilirubin: 0.3 mg/dL (ref 0.0–1.2)
Total Protein: 6.4 g/dL — ABNORMAL LOW (ref 6.5–8.1)

## 2024-02-04 MED ORDER — SODIUM CHLORIDE 0.9% FLUSH
10.0000 mL | INTRAVENOUS | Status: DC | PRN
  Administered 2024-02-04: 10 mL via INTRAVENOUS

## 2024-02-04 MED ORDER — HEPARIN SOD (PORK) LOCK FLUSH 100 UNIT/ML IV SOLN
500.0000 [IU] | Freq: Once | INTRAVENOUS | Status: AC
  Administered 2024-02-04: 500 [IU] via INTRAVENOUS

## 2024-02-04 NOTE — Progress Notes (Signed)
 [facility] OFFICE PROGRESS NOTE   Diagnosis: Uterine cancer  INTERVAL HISTORY:   Dana [name] completed another cycle of carboplatin  on 01/14/2024.  She reports prolonged nausea following chemotherapy.  No emesis.  She continues to have neuropathy symptoms in the feet.  She feels the right groin mass is enlarging.  Objective:  Vital signs in last 24 hours:  Blood pressure 131/82, pulse 80, temperature 98.2 F (36.8 C), temperature source Temporal, resp. rate 18, height 5' 1 (1.549 m), weight 180 lb (81.6 kg), SpO2 100%.    HEENT: No thrush or ulcers Resp: Lungs clear bilaterally Cardio: Regular rate and rhythm GI: No hepatosplenomegaly, firm fungating mass in the right groin Vascular: Edema throughout the left leg Musculoskeletal: Right thigh without mass  Portacath/PICC-without erythema  Lab Results:  Lab Results  Component Value Date   WBC 3.8 (L) 02/04/2024   HGB 11.9 (L) 02/04/2024   HCT 34.8 (L) 02/04/2024   MCV 101.5 (H) 02/04/2024   PLT 183 02/04/2024   NEUTROABS 2.1 02/04/2024    CMP  Lab Results  Component Value Date   NA 138 02/04/2024   K 4.0 02/04/2024   CL 102 02/04/2024   CO2 24 02/04/2024   GLUCOSE 90 02/04/2024   BUN 11 02/04/2024   CREATININE 0.80 02/04/2024   CALCIUM 9.2 02/04/2024   PROT 6.4 (L) 02/04/2024   ALBUMIN 3.9 02/04/2024   AST 20 02/04/2024   ALT 14 02/04/2024   ALKPHOS 71 02/04/2024   BILITOT 0.3 02/04/2024   GFRNONAA >60 02/04/2024   GFRAA >60 04/06/2020   Imaging:  CT FEMUR RIGHT W CONTRAST Result Date: 02/04/2024 CLINICAL DATA:  Uterine cancer, right inguinal mass EXAM: CT OF THE LOWER RIGHT EXTREMITY WITH CONTRAST TECHNIQUE: Multidetector CT imaging of the lower right extremity was performed according to the standard protocol following intravenous contrast administration. RADIATION DOSE REDUCTION: This exam was performed according to the departmental dose-optimization program which includes automated  exposure control, adjustment of the mA and/or kV according to patient size and/or use of iterative reconstruction technique. CONTRAST:  OMNIPAQUE  IOHEXOL  300 MG/ML  SOLN COMPARISON:  CT pelvis 02/03/2024 and CT right femur 08/10/2023 FINDINGS: Bones/Joint/Cartilage Right total hip prosthesis without complicating feature. No significant bony abnormality of the right femur. Ligaments Suboptimally assessed by CT. Muscles and Tendons Unremarkable Soft tissues Soft tissue mass along the right groin crease extending to the inguinal canal lateral aponeurotic margin of the rectus abdominus, measuring about 6.4 by 3.7 cm on image 30 series 6, formerly 4.9 by 3.0 cm when measured in a similar fashion. This mass is enlarging. There is left (contralateral) diffuse subcutaneous edema in the medial thigh. IMPRESSION: 1. Enlarging soft tissue mass along the right groin crease extending to the inguinal canal lateral aponeurotic margin of the rectus abdominus, measuring about 6.4 by 3.7 cm, formerly 4.9 by 3.0 cm when measured in a similar fashion. This mass is enlarging. 2. Left (contralateral) diffuse subcutaneous edema in the medial thigh. 3. Right total hip prosthesis without complicating feature. Electronically Signed   By: Ryan Salvage M.D.   On: 02/04/2024 10:59   CT CHEST ABDOMEN PELVIS W CONTRAST Result Date: 02/04/2024 CLINICAL DATA:  Restaging endometrioid adenocarcinoma * Tracking Code: BO * EXAM: CT CHEST, ABDOMEN, AND PELVIS WITH CONTRAST TECHNIQUE: Multidetector CT imaging of the chest, abdomen and pelvis was performed following the standard protocol during bolus administration of intravenous contrast. RADIATION DOSE REDUCTION: This exam was performed according to the departmental dose-optimization program which  includes automated exposure control, adjustment of the mA and/or kV according to patient size and/or use of iterative reconstruction technique. CONTRAST:  OMNIPAQUE  IOHEXOL  300 MG/ML  SOLN  COMPARISON:  08/10/2023 FINDINGS: CT CHEST FINDINGS Cardiovascular: Right Port-A-Cath tip: Right atrium. Atheromatous vascular calcification of the thoracic aorta. Mediastinum/Nodes: Small type 1 hiatal hernia. Lungs/Pleura: Mild scarring in the posterior basal segment right lower lobe. The previous 6 mm right lower lobe pulmonary nodule currently measures 0.2 by 0.3 cm on image 76 series 2. Near resolution favors benign etiology or treatment response. Musculoskeletal: Right greater than left degenerative glenohumeral arthropathy. Thoracic kyphosis and spondylosis. Small eventration of the left hemidiaphragm medially contains adipose tissue, similar to prior. CT ABDOMEN PELVIS FINDINGS Hepatobiliary: Stable small hepatic cysts. Gallbladder unremarkable. Pancreas: Unremarkable Spleen: Unremarkable Adrenals/Urinary Tract: Stable incidental bilateral peripelvic cysts. No further imaging workup of these lesions is indicated. Otherwise unremarkable. Stomach/Bowel: Descending and sigmoid colon diverticulosis. Vascular/Lymphatic: Atherosclerosis is present, including aortoiliac atherosclerotic disease. Atheromatous plaque at the origins of the celiac trunk and SMA without critical stenosis or overt occlusion. Retroaortic lymph node 0.6 cm in short axis on image 70 series 1, previously 0.5 cm. Abnormal masslike thickening along the right groin crease measures 5.4 by 4.5 cm on image 102 series 1, formerly 4.0 by 3.7 cm. Reproductive: Uterus absent. Other: Stranding in the lower pannus suspicious for mild panniculitis. Musculoskeletal: Left paracentral supraumbilical hernia contains adipose tissue on image 73 series 1, not substantially changed from prior. Right total hip prosthesis. Lower lumbar spondylosis and degenerative disc disease. IMPRESSION: 1. Abnormal masslike thickening along the right groin crease measures 5.4 by 4.5 cm, formerly 4.0 by 3.7 cm. Appearance suspicious for malignancy. 2. Retroaortic lymph node 0.6 cm  in short axis, previously 0.5 cm. Likely incidental but merits surveillance. 3. Near resolution of the previous 6 mm right lower lobe pulmonary nodule, favoring benign etiology or treatment response. 4. Descending and sigmoid colon diverticulosis. 5. Left paracentral supraumbilical hernia contains adipose tissue, not substantially changed from prior. 6. Stranding in the lower pannus suspicious for mild panniculitis. 7. Small type 1 hiatal hernia. 8.  Aortic Atherosclerosis (ICD10-I70.0). Electronically Signed   By: Ryan Salvage M.D.   On: 02/04/2024 10:55    Medications: I have reviewed the patient's current medications.   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, status post a TAH, BSO, appendectomy, pelvic and periaortic lymphadenectomy, and wedge resection of a liver lesion Gross complete resection, tumor involve both ovaries, lymph nodes negative 6 cycles of adjuvant Taxol /carboplatin  CT abdomen/pelvis 03/28/2019-enlarged right inguinal and iliac nodes 04/10/2019-right inguinal lymph node biopsy-metastatic adenocarcinoma, ER 90%, PR 95%, PD-L1 combined positive score-1, MSS, tumor mutation burden-4 Cisplatin  05/16/2019 20-1 cycle Radiation to the lateral pelvis and 1 lymph nodes November 2020 CT 12/72/020-decrease size of right inguinal and external iliac lymph nodes, no evidence of disease progression Everolimus  and letrozole  07/14/2019 - 04/06/2020 CT 10/20/2019-resolution of right iliac lymph node, decrease in size of right inguinal node CT 03/08/2020-no evidence of recurrent or metastatic disease in the abdomen or pelvis CT 05/31/2020-no evidence of recurrent or metastatic disease CT 03/11/2021-increase right inguinal lymph node, enlargement of bilateral inguinal lymph nodes, subcutaneous soft tissue nodule at the anterior right thigh Carboplatin  04/06/2021, 04/25/2021, 05/16/2021 CT 07/12/2021-mild decrease in size of bilateral inguinal  lymphadenopathy, no progressive disease in the abdomen or pelvis, 0.6 cm left inguinal node was 0.8 cm previously Letrozole  08/06/2021 CT abdomen/pelvis 04/10/2022-stable right inguinal lymph node, no new site of metastatic  disease CT right femur  04/10/2022 -2.2 x 1 cm anterior subcutaneous mass Letrozole  continued CT right femur 07/19/2022-mild enlargement of right thigh subcutaneous mass stable right inguinal nodal mass CTs 07/19/2022-stable right inguinal lymph node, no new or progressive disease 11/20/2022-right thigh mass larger and tender, letrozole  discontinued 11/28/2022-significant enlargement of anterior right thigh soft tissue mass, slightly larger right inguinal mass 12/25/2022-wide local excision of right thigh mass, metastatic endometrial adenocarcinoma, ER positive, PR positive, Her 2 negative(0),negative resection margins Cycle 1 pembrolizumab /lenvatinib  02/05/2023 Cycle 2 Pembrolizumab  02/27/2023, lenvatinib  placed on hold due to mucositis and hypertension Cycle 3 Pembrolizumab  03/20/2023, lenvatinib  resumed at a dose of 10 mg daily Lenvatinib  discontinued 03/30/2023 due to mucositis, hand-foot symptoms Afinitor /letrozole  04/21/2023 CTs 08/10/2023: Increased nodular tissue anterior to the right hip, new 6 mm right lung nodule CT right femur 08/10/2023: No evidence of recurrent tumor at the right anterior thigh, enlargement of right inguinal soft tissue mass with an additional enlarging mass inferiorly Cycle 1 Carboplatin  11/06/2023 Cycle 2 carboplatin  11/26/2023, dose reduction to AUC 4 due to f mild thrombocytopenia, home Decadron  prophylaxis Treatment held due to neutropenia 12/18/2023 Cycle 3 carboplatin  12/25/2023, dose reduction due to neutropenia following cycle 2, patient declines white cell growth factor support Cycle 4 carboplatin  01/14/2024 CT right femur 02/03/2024-enlarging right inguinal mass CTs 02/03/2024: Previous right lower lobe nodule now measures 0.2 x 0.3 cm-favor benign versus  treatment response, increased masslike thickening in the right groin   2.  Left lower extremity lymphedema, improved after therapy in the lymphedema clinic   3.  Chronic diarrhea controlled with Lomotil      Disposition: Ms Stach has metastatic endometrial cancer.  She has completed 4 cycles of single agent carboplatin .  The right groin mass is larger on clinical exam and restaging CTs, though there was a 32-month interval between the baseline CT and initiation of carboplatin .  I believe the overall pattern is consistent with disease progression.  Carboplatin  will be discontinued.  I reviewed the CT findings and images with her. We discussed salvage systemic treatment options including doxorubicin.  We could also consider treatment with bevacizumab.  I will contact Dr. Viktoria to again discuss the indication for resection of the right groin mass and clinical trial availability at [facility].  Ms. Catalina does not wish to begin treatment at present.  She will return for an office visit and further discussion in 3-4 weeks.  Arley Hof, MD  02/04/2024  11:10 AM

## 2024-02-04 NOTE — Patient Instructions (Signed)

## 2024-02-05 ENCOUNTER — Ambulatory Visit

## 2024-02-05 ENCOUNTER — Ambulatory Visit (HOSPITAL_BASED_OUTPATIENT_CLINIC_OR_DEPARTMENT_OTHER)

## 2024-02-14 ENCOUNTER — Telehealth: Payer: Self-pay | Admitting: Oncology

## 2024-02-14 ENCOUNTER — Telehealth: Payer: Self-pay | Admitting: *Deleted

## 2024-02-14 NOTE — Telephone Encounter (Signed)
 Ms. Yung called that she needs an appointment with Dr. Viktoria to discuss clinical trial and was told she needs a new referral. Confirmed w/Dr. Cloretta that he spoke w/Dr. Viktoria who agreed to get her in. No new referral needed. Eleanor Epps, NP will arrange this.

## 2024-02-14 NOTE — Telephone Encounter (Signed)
 [facility] and scheduled an appointment with Dr. Viktoria on 02/22/24 at 8:30.

## 2024-02-21 ENCOUNTER — Encounter: Payer: Self-pay | Admitting: Gynecologic Oncology

## 2024-02-21 ENCOUNTER — Inpatient Hospital Stay: Attending: Hematology and Oncology | Admitting: Gynecologic Oncology

## 2024-02-21 VITALS — BP 137/81 | HR 96 | Temp 98.0°F | Resp 17 | Wt 176.0 lb

## 2024-02-21 DIAGNOSIS — R911 Solitary pulmonary nodule: Secondary | ICD-10-CM | POA: Insufficient documentation

## 2024-02-21 DIAGNOSIS — C541 Malignant neoplasm of endometrium: Secondary | ICD-10-CM | POA: Insufficient documentation

## 2024-02-21 DIAGNOSIS — Z9221 Personal history of antineoplastic chemotherapy: Secondary | ICD-10-CM | POA: Insufficient documentation

## 2024-02-21 DIAGNOSIS — Z90710 Acquired absence of both cervix and uterus: Secondary | ICD-10-CM | POA: Diagnosis not present

## 2024-02-21 DIAGNOSIS — Z1732 Human epidermal growth factor receptor 2 negative status: Secondary | ICD-10-CM | POA: Insufficient documentation

## 2024-02-21 DIAGNOSIS — Z7189 Other specified counseling: Secondary | ICD-10-CM

## 2024-02-21 DIAGNOSIS — Z90722 Acquired absence of ovaries, bilateral: Secondary | ICD-10-CM | POA: Diagnosis not present

## 2024-02-21 DIAGNOSIS — C55 Malignant neoplasm of uterus, part unspecified: Secondary | ICD-10-CM

## 2024-02-21 DIAGNOSIS — R197 Diarrhea, unspecified: Secondary | ICD-10-CM

## 2024-02-21 NOTE — Progress Notes (Signed)
 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  The patient's initial surgery was with Dr. Toribio Orval Sierra in [place] on January 05, 2014.  It included an exploratory laparotomy with TAH, BSO, appendectomy, pelvic and para-aortic lymphadenectomy and wedge resection of a concerning appearing liver lesion.  Preoperatively she presented with a 26 cm right adnexal mass and an elevated Ca1 25 at 404 units/mL.  At the completion of surgery there was no gross residual disease.  Final pathology showed a primary grade 3 endometrial adenocarcinoma (endometrioid) involving both ovaries.  Lymph nodes and other biopsies were negative.  She had uncomplicated postoperative course.  She was treated with 6 cycles of carboplatin  paclitaxel  chemotherapy.  She was recommended adjuvant radiation therapy however declined this after counseling.  Following her initial therapy she developed severe left lower extremity lymphedema.   Follow-up CT scan in March 2016 showed no evidence of disease.  She was followed every 3 months for a total of 5 years with no recurrence appreciated during that time   03/28/2019 Imaging   Ct abdomen and pelvis 1. Enlarged right inguinal lymph node or soft tissue mass, measuring 3.8 x 3.5 cm (series 2, image 69). Additional enlarged right iliac lymph node measuring 1.9 x 1.1 cm (series 2, image 68). Findings are concerning for malignancy.   2. Status post hysterectomy per stated history of endometrial malignancy.   3. No other findings in the abdomen or pelvis concerning for malignancy or metastatic disease.   4. There are multiple subcentimeter  low-attenuation lesions of the liver, the largest in the left lobe the measuring 7 mm, too small to characterize (series 2, image 24). Attention on follow-up although these are most likely incidental benign small cysts and/or hemangiomata.   5.  Aortic atherosclerosis.   04/10/2019 Pathology Results   A. LYMPH NODE, RIGHT INGUINAL, NEEDLE CORE BIOPSY:  - Metastatic adenocarcinoma.  - See comment.   COMMENT:   The morphology is most consistent with metastatic endometrioid adenocarcinoma.  Immunohistochemistry can be performed if clinically indicated.  There is sufficient tissue for additional testing   04/10/2019 Procedure   Technically successful ultrasound guided biopsy of dominant right inguinal lymph node   04/22/2019 Cancer Staging   Staging form: Corpus Uteri - Carcinoma and Carcinosarcoma, AJCC 8th Edition - Pathologic stage from 04/22/2019: FIGO Stage IVB (rpT3a, pN0, pM1) - Signed by Lonn Hicks, MD on 04/22/2019   04/22/2019 PET scan   1. Two hypermetabolic RIGHT iliac lymph nodes and larger metastatic RIGHT inguinal lymph node. 2. No evidence of metastatic disease outside the pelvis. 3. No clear evidence of activity at the vaginal cuff.   05/15/2019 Procedure   Placement of a subcutaneous port device. Catheter tip in the lower SVC   05/16/2019 - 06/12/2019 Chemotherapy   The patient had cisplatin  for chemotherapy treatment.      Genetic Testing   Patient has genetic testing done for MSI. Results revealed patient has the following: MSI Stable   06/23/2019 Imaging   1. Interval decreased size of right inguinal and external iliac lymph nodes. 2. No evidence of progressive metastatic disease. 3. Stable small low-density hepatic lesions, likely cysts. 4. Aortic  Atherosclerosis (ICD10-I70.0).     07/14/2019 - 04/06/2020 Chemotherapy   The patient had everolimus  and letrozole  for chemotherapy treatment.  Treatment was discontinued due to significant side effects     10/20/2019 Imaging   1. No acute abdominal/pelvic findings, mass lesions or adenopathy. 2. Interval resolution of right iliac lymph node. No residual measurable lymph node is identified. Decrease in size of the right inguinal node. 3. Stable scattered hepatic cysts.   Aortic Atherosclerosis (ICD10-I70.0).   03/08/2020 Imaging   No evidence of recurrent or metastatic carcinoma within the abdomen or pelvis.   Colonic diverticulosis. No radiographic evidence of diverticulitis.   Stable small epigastric ventral hernia containing only fat.     05/31/2020 Imaging   Status post hysterectomy and suspected bilateral salpingo-oophorectomy.   No evidence of recurrent or metastatic disease.   06/22/2020 Procedure   Successful right IJ vein Port-A-Cath explant.     03/11/2021 Imaging   1. There has been interval increase in size of a lymph node or soft tissue mass in the right inguinal station, measuring 3.0 x 2.6 cm, previously 1.9 x 1.7 cm. Interval enlargement of bilateral inguinal lymph nodes. Findings are concerning for nodal metastatic disease. 2. There is a subcutaneous soft tissue nodule of the anterior right thigh measuring 1.6 x 1.0 cm, this level of the thigh not included in the field of view prior examination. This is of uncertain significance, potentially incidental and infectious or inflammatory, however concerning for malignancy given other findings. Consider targeted ultrasound and tissue sampling. 3. Status post hysterectomy and oophorectomy. 4. Small, fat containing midline epigastric hernia.   Aortic Atherosclerosis (ICD10-I70.0).   04/06/2021 - 05/16/2021 Chemotherapy   Patient is on Treatment Plan : UTERINE Carboplatin  only     07/12/2021 Imaging   Mild decrease in size of bilateral inguinal lymphadenopathy since prior exam.   No new or progressive disease within the abdomen or pelvis.   Colonic diverticulosis. No radiographic evidence of diverticulitis.   Aortic  Atherosclerosis (ICD10-I70.0).   02/05/2023 - 03/20/2023 Chemotherapy   Patient is on Treatment Plan : UTERINE Lenvatinib  (20) D1-21 + Pembrolizumab  (200) D1 q21d     11/06/2023 - 01/14/2024 Chemotherapy   Patient is on Treatment Plan : BREAST Carboplatin  (AUC 5) q21d      The patient initially presented with a 26 cm right adnexal mass and an elevated Ca1 25 at 404 units/mL. 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 This was an R0 resection. Final pathology showed a grade 3 endometrial adenocarcinoma (endometrioid) involving both ovaries.  Lymph nodes and other biopsies were negative.  She was received 6 cycles of carboplatin /paclitaxel  adjuvant chemotherapy.  She was recommended adjuvant radiation therapy however declined this after counseling due to concern about toxicity.  At the completion of therapy her Ca1 25 was normal (4.1) and CT imaging showed no residual disease.    Following her initial therapy she developed severe left lower extremity lymphedema. She was followed every 3 months for a total of 5 years with no recurrence appreciated during that time.   In the fall 2020, she began experiencing some right upper quadrant and back pain.  This prompted a series of evaluations including a CT scan of the abdomen and pelvis which was performed on March 28, 2019.  This scan showed enlarged right inguinal lymph node  with soft tissue mass measuring 3.8 x 3.5 cm and an enlarged right iliac lymph node measuring 1.9 x 1.1 cm.  Findings were concerning for malignancy.  A fine-needle aspirate biopsy was performed of the right inguinal lymph node on April 10, 2019 and this revealed metastatic adenocarcinoma consistent with endometrioid primary. ER 90%, PR 95%, PD-L1 combined positive  score-1, MSS, TMB-4.    She received RT with sensitizing cisplain, completed in 05/2019. Post-treatment CT in 06/2019 showed decreased size of right inguinal and external iliac lymph nodes, no disease progression. She was then started on everolimus  and letrozole , which she continued until 03/2020. CT in 05/2020 showed no evidence of recurrent or metastatic disease.    In 02/2021, imaging revealed enlarged bilateral inguinal lymph nodes, nodule within the subcutaneous soft tissue of the anterior right thigh. She was treated with 3C of carboplatin  only, last in late 04/2021. CT imaging in 06/2021 showed mild decrease in size of bilateral inguinal lymphadenopathy, no progressive disease in the abdomen or pelvis. She was then started on letrozole  in 07/2021. Imaging in 03/2022 showed stable right inguinal lymph node, 2.2x1 cm right thigh mass, no new site of metastatic disease.   CT of the right femur in 07/2022 showed mild enlargement of the right thigh subcutaneous mass, stable right inguinal nodal mass, new static disease.  Repeat imaging in 11/2022 showed larger right thigh mass, which had become more tender.  Letrozole  was discontinued at this time.  Patient underwent wide local excision of the right thigh mass on 12/25/22.  Tumor noted to be 4.5 cm consistent with metastatic adenocarcinoma from her primary endometrial cancer.  Margins negative.  P53 wild-type, ER/PR positive, p16 patchy staining.   In the setting of her MMR proficient tumor, she was started on pembrolizumab  and lenvatinib  in 01/2023.  Lenvatinib  placed on hold in August at the time of cycle 2 secondary to mucositis and hypertension.  It was resumed in September with cycle 3 at a lower dose (10 mg q day).  Ultimately, lenvatinib  was discontinued on 9/13 due to mucositis, hand/foot symptoms.   After 3 cycles, pembrolizumab  was discontinued and the patient was started on everolimus  and letrozole  in 04/2023.   Imaging in 1/20255 showed increased  nodular tissue anterior to the right hip/inguinal soft tissue mass with an additional enlarging mass inferiorly, no recurrent tumor at the right anterior thigh, new 6 mm right lung nodule.  Started single agent carboplatin  10/2023. C4 was 01/14/24.  CT C/A/P 02/03/24: 1. Abnormal masslike thickening along the right groin crease measures 5.4 by 4.5 cm, formerly 4.0 by 3.7 cm. Appearance suspicious for malignancy. 2. Retroaortic lymph node 0.6 cm in short axis, previously 0.5 cm. Likely incidental but merits surveillance. 3. Near resolution of the previous 6 mm right lower lobe pulmonary nodule, favoring benign etiology or treatment response. 4. Descending and sigmoid colon diverticulosis. 5. Left paracentral supraumbilical hernia contains adipose tissue, not substantially changed from prior. 6. Stranding in the lower pannus suspicious for mild panniculitis. 7. Small type 1 hiatal hernia. 8.  Aortic Atherosclerosis (ICD10-I70.0).  CT right femur 02/03/24: 1. Enlarging soft tissue mass along the right groin crease extending to the inguinal canal lateral aponeurotic margin of the rectus abdominus, measuring about 6.4 by 3.7 cm, formerly 4.9 by 3.0 cm when measured in a similar fashion. This mass is enlarging. 2. Left (contralateral) diffuse subcutaneous edema in the medial thigh. 3. Right total hip prosthesis without complicating feature.  Interval History: Endorses having increasing right groin pain  and more edema of her lower abdomen.  Pain and edema have been slowly progressive since I saw her last.  Also having increased drainage and some bleeding from the erupting groin mass.  Continues to struggle with GI symptoms including intermittent nausea and emesis, diarrhea.  Was unable to see GI after her last visit with me.  Past Medical/Surgical History: Past Medical History:  Diagnosis Date   Arthritis    GERD (gastroesophageal reflux disease)    History of blood transfusion    Hypothyroidism     Lymphedema    left leg- ted hose makes it worse   Thyroid  disease    Uterine cancer (HCC)    Endometrial cancer - surgery and chemo    Past Surgical History:  Procedure Laterality Date   ABDOMINAL HYSTERECTOMY     APPENDECTOMY     COLONOSCOPY W/ POLYPECTOMY     EXCISION MASS LOWER EXTREMETIES Right 12/25/2022   Procedure: EXCISION OF RIGHT THIGH MASS, MALIGNANT AND ADVANCEMENT FLAP CLOSURE;  Surgeon: Aron Shoulders, MD;  Location: [facility];  Service: General;  Laterality: Right;   EYE SURGERY Bilateral    cataracts   IR IMAGING GUIDED PORT INSERTION  05/15/2019   IR IMAGING GUIDED PORT INSERTION  10/29/2023   IR REMOVAL TUN ACCESS W/ PORT W/O FL MOD SED  06/23/2020   LIVER BIOPSY     mass removed     2015 from pelvis   TOTAL HIP ARTHROPLASTY Right 07/13/2016   Procedure: RIGHT TOTAL HIP ARTHROPLASTY ANTERIOR APPROACH;  Surgeon: Kay CHRISTELLA Cummins, MD;  Location: [facility];  Service: Orthopedics;  Laterality: Right;    Family History  Problem Relation Age of Onset   Heart disease Mother    Multiple myeloma Mother    Breast cancer Neg Hx    Ovarian cancer Neg Hx    Pancreatic cancer Neg Hx    Colon cancer Neg Hx    Endometrial cancer Neg Hx    Prostate cancer Neg Hx     Social History   Socioeconomic History   Marital status: Single    Spouse name: Not on file   Number of children: Not on file   Years of education: Not on file   Highest education level: Not on file  Occupational History   Occupation: food Social worker  Tobacco Use   Smoking status: Never   Smokeless tobacco: Never  Vaping Use   Vaping status: Never Used  Substance and Sexual Activity   Alcohol  use: No    Alcohol /week: 0.0 standard drinks of alcohol    Drug use: No   Sexual activity: Not Currently  Other Topics Concern   Not on file  Social History Narrative   Not on file   Social Drivers of Health   Financial Resource Strain: Not on file  Food Insecurity: Not on file   Transportation Needs: Not on file  Physical Activity: Not on file  Stress: Not on file  Social Connections: Not on file    Current Medications:  Current Outpatient Medications:    albuterol (VENTOLIN HFA) 108 (90 Base) MCG/ACT inhaler, Inhale 2 puffs into the lungs every 4 (four) hours as needed., Disp: , Rfl:    dexamethasone  (DECADRON ) 4 MG tablet, Take 1 tablet (4 mg total) by mouth 2 (two) times daily. Take twice daily x 3 days after each chemotherapy treatment (begin on day after each treatment), Disp: 24 tablet, Rfl: 1   diphenoxylate -atropine  (LOMOTIL ) 2.5-0.025 MG tablet, TAKE ONE TO TWO  TABLETS BY MOUTH FOUR TIMES A DAY AS NEEDED FOR DIARRHEA / LOOSE STOOLS, Disp: 90 tablet, Rfl: 0   HYDROcodone -acetaminophen  (NORCO/VICODIN) 5-325 MG tablet, Take 1 tablet by mouth every 6 (six) hours as needed for moderate pain., Disp: 15 tablet, Rfl: 0   levothyroxine  (SYNTHROID ) 75 MCG tablet, Take 75 mcg by mouth daily., Disp: , Rfl:    lidocaine -prilocaine  (EMLA ) cream, Apply 1 Application topically as needed. Apply to portacath 1-2 hours prior to use, Disp: 30 g, Rfl: 3   losartan  (COZAAR ) 25 MG tablet, Take 1 tablet (25 mg total) by mouth daily., Disp: 90 tablet, Rfl: 1   prochlorperazine  (COMPAZINE ) 10 MG tablet, Take 10 mg by mouth every 6 (six) hours as needed for nausea or vomiting., Disp: , Rfl:  No current facility-administered medications for this visit.  Facility-Administered Medications Ordered in Other Visits:    sodium chloride  flush (NS) 0.9 % injection 10 mL, 10 mL, Intravenous, PRN, Debby Olam POUR, NP, 10 mL at 12/18/23 9074  Review of Systems: + groin pain Denies appetite changes, fevers, chills, fatigue, unexplained weight changes. Denies hearing loss, neck lumps or masses, mouth sores, ringing in ears or voice changes. Denies cough or wheezing.  Denies shortness of breath. Denies chest pain or palpitations. Denies leg swelling. Denies abdominal distention, blood in  stools, constipation, diarrhea, nausea, vomiting, or early satiety. Denies pain with intercourse, dysuria, frequency, hematuria or incontinence. Denies hot flashes, pelvic pain, vaginal bleeding or vaginal discharge.   Denies joint pain, back pain or muscle pain/cramps. Denies itching, rash, or wounds. Denies dizziness, headaches, numbness or seizures. Denies easy bruising or bleeding. Denies anxiety, depression, confusion, or decreased concentration.  Physical Exam: BP 137/81 (BP Location: Left Arm, Patient Position: Sitting)   Pulse 96   Temp 98 F (36.7 C) (Oral)   Resp 17   Wt 176 lb (79.8 kg)   SpO2 100%   BMI 33.25 kg/m  General: Alert, oriented, no acute distress. HEENT: Posterior oropharynx clear, sclera anicteric. Abdomen: Obese, soft, nontender.  Normoactive bowel sounds.  No masses or hepatosplenomegaly appreciated.  There is a 5 x 2 fungating mass within the right groin that feels fairly fixed posteriorly. Extremities: Grossly normal range of motion.  Warm, well perfused.  Trace edema bilaterally.  Laboratory & Radiologic Studies: Newest imaging above in treatment history  Assessment & Plan: Dana [name] is a 73 y.o. woman with recurrent and progressive endometrial cancer.  HER2 negative (0). ER 90%, PR 95%, PD-L1 CPS 1, MSS, TMB-4. No actionable mutations.  Patient overall doing well although has become more symptomatic with regard to her right groin lesion.  We reviewed most recent CT imaging which shows some progression of the lesion.  While I agree the right groin mass is bigger, I think the measurement from her last CT scan underestimates the total size given secondary component posterior to the larger lesion.  This now looks mostly contiguous on recent scan.  Pulmonary lesion has decreased in size.  We discussed that this either means mixed response to carboplatin  or, more likely, that pulmonary lesion was unrelated/benign.  There are no other areas concerning  for metastatic disease on her recent CT scan.  We discussed treatment options moving forward.  She had been quite resistant to discussion of surgery at her last visit.  She notes slow continued progress in terms of numbness and nerve issues related to her right thigh surgery and is at least open to discussion of surgery.  I reviewed what  this would entail and that recommend plastic surgery involvement given prior radiation to this area and possible need for complex closure of the incision if resected.  We also discussed nearby structures including right upper leg muscle, femoral vessels, and abdominal wall musculature.  It looks as though there may be some involvement of the inferior aspect of the abdominal wall musculature.  There is a plane between the femoral vessels and the mass.  In discussing with one of our radiologist, he believes that there is a plane between the right thigh musculature and the mass.  MRI of the right hip with and without contrast could be helpful in further delineating this anatomy if we proceed with surgery.  We also discussed the option of radiation.  She is still fairly adamant that she would not except any further radiation even if she were a candidate.  I am unsure whether she is a candidate for any additional radiation.  She has struggled significantly with GI symptoms after external beam radiation.  These are symptoms that were not present prior to radiation and I think given timing, are related to her radiation.  I do not know that she would be a candidate for any localized radiation, but I will reach out to our radiation oncologist here and likely our radiation oncologist at [facility] to see if there is anything that could be offered.  We also talked about systemic treatment.  She and I had previously discussed single agent Doxil.  At her visit at [facility], single agent Taxotere was also reviewed.  We discussed both agents today as well as their side effect profiles.  We reviewed again  hormonal options.  Unfortunately her tumor is HER2 negative and she does not have any actionable mutations based on her somatic testing.  Unfortunately, the Phase 1 ONC201 & Aezolizumab trial is currently close to enrollment at [facility].  I will reach out to see if she is a candidate for [id number].  Given continued GI symptoms, discussed again referral to gastroenterology.  She was interested in a referral to [place].  My office will help facilitate referral being sent.  48 minutes of total time was spent for this patient encounter, including preparation, face-to-face counseling with the patient and coordination of care, and documentation of the encounter.  Comer Dollar, MD  Division of Gynecologic Oncology  Department of Obstetrics and Gynecology  [facility]

## 2024-02-22 ENCOUNTER — Inpatient Hospital Stay: Admitting: Gynecologic Oncology

## 2024-02-28 ENCOUNTER — Other Ambulatory Visit: Payer: Self-pay | Admitting: Gynecologic Oncology

## 2024-02-28 ENCOUNTER — Encounter: Payer: Self-pay | Admitting: Gynecologic Oncology

## 2024-03-05 ENCOUNTER — Encounter: Payer: Self-pay | Admitting: Nurse Practitioner

## 2024-03-05 ENCOUNTER — Encounter: Payer: Self-pay | Admitting: *Deleted

## 2024-03-05 ENCOUNTER — Telehealth: Payer: Self-pay

## 2024-03-05 ENCOUNTER — Inpatient Hospital Stay (HOSPITAL_BASED_OUTPATIENT_CLINIC_OR_DEPARTMENT_OTHER): Admitting: Nurse Practitioner

## 2024-03-05 VITALS — BP 127/80 | HR 100 | Temp 97.7°F | Resp 18 | Ht 61.0 in | Wt 175.5 lb

## 2024-03-05 DIAGNOSIS — R911 Solitary pulmonary nodule: Secondary | ICD-10-CM | POA: Diagnosis not present

## 2024-03-05 DIAGNOSIS — Z90722 Acquired absence of ovaries, bilateral: Secondary | ICD-10-CM | POA: Diagnosis not present

## 2024-03-05 DIAGNOSIS — C541 Malignant neoplasm of endometrium: Secondary | ICD-10-CM | POA: Diagnosis not present

## 2024-03-05 DIAGNOSIS — Z1732 Human epidermal growth factor receptor 2 negative status: Secondary | ICD-10-CM | POA: Diagnosis not present

## 2024-03-05 DIAGNOSIS — Z90710 Acquired absence of both cervix and uterus: Secondary | ICD-10-CM | POA: Diagnosis not present

## 2024-03-05 DIAGNOSIS — Z9221 Personal history of antineoplastic chemotherapy: Secondary | ICD-10-CM | POA: Diagnosis not present

## 2024-03-05 DIAGNOSIS — C55 Malignant neoplasm of uterus, part unspecified: Secondary | ICD-10-CM

## 2024-03-05 NOTE — Progress Notes (Signed)
 FOLR testing requested on accession number [id number]

## 2024-03-05 NOTE — Telephone Encounter (Signed)
 Spoke with patient in regards to possible Pelvic MRI at [facility]. Radiology stated that typically the patient will be positioned supine/ superior in the MRI machine but if patient wants can request lower body only be entered. Understood. Patient stated she did not want to schedule anything at this time, has a scheduled appointment tomorrow with Comer Dollar MD Gynecologic Oncology then would follow-up to make a decision.

## 2024-03-05 NOTE — Progress Notes (Unsigned)
 [facility] OFFICE PROGRESS NOTE   Diagnosis: Uterine cancer  INTERVAL HISTORY:   [name] returns for follow-up.  She notes increased pain involving the right groin.  Tylenol  is effective.  Appetite overall is poor.  2 weeks ago she had an episode of nausea/vomiting, diarrhea lasting for a few days.  Objective:  Vital signs in last 24 hours:  Blood pressure 127/80, pulse 100, temperature 97.7 F (36.5 C), temperature source Temporal, resp. rate 18, height 5' 1 (1.549 m), weight 175 lb 8 oz (79.6 kg), SpO2 98%.    Resp: Lungs clear bilaterally. Cardio: Regular rate and rhythm. GI: No hepatomegaly.  Nontender. Vascular: Edema throughout the left leg.  Masslike firmness right lower abdomen/pelvis, fungating mass right groin. Port-A-Cath without erythema.  Lab Results:  Lab Results  Component Value Date   WBC 3.8 (L) 02/04/2024   HGB 11.9 (L) 02/04/2024   HCT 34.8 (L) 02/04/2024   MCV 101.5 (H) 02/04/2024   PLT 183 02/04/2024   NEUTROABS 2.1 02/04/2024    Imaging:  No results found.  Medications: I have reviewed the patient's current medications.  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, status post a TAH, BSO, appendectomy, pelvic and periaortic lymphadenectomy, and wedge resection of a liver lesion Gross complete resection, tumor involve both ovaries, lymph nodes negative 6 cycles of adjuvant Taxol /carboplatin  CT abdomen/pelvis 03/28/2019-enlarged right inguinal and iliac nodes 04/10/2019-right inguinal lymph node biopsy-metastatic adenocarcinoma, ER 90%, PR 95%, PD-L1 combined positive score-1, MSS, tumor mutation burden-4 Cisplatin  05/16/2019 20-1 cycle Radiation to the lateral pelvis and 1 lymph nodes November 2020 CT 12/72/020-decrease size of right inguinal and external iliac lymph nodes, no evidence of disease progression Everolimus  and letrozole  07/14/2019 - 04/06/2020 CT 10/20/2019-resolution of  right iliac lymph node, decrease in size of right inguinal node CT 03/08/2020-no evidence of recurrent or metastatic disease in the abdomen or pelvis CT 05/31/2020-no evidence of recurrent or metastatic disease CT 03/11/2021-increase right inguinal lymph node, enlargement of bilateral inguinal lymph nodes, subcutaneous soft tissue nodule at the anterior right thigh Carboplatin  04/06/2021, 04/25/2021, 05/16/2021 CT 07/12/2021-mild decrease in size of bilateral inguinal lymphadenopathy, no progressive disease in the abdomen or pelvis, 0.6 cm left inguinal node was 0.8 cm previously Letrozole  08/06/2021 CT abdomen/pelvis 04/10/2022-stable right inguinal lymph node, no new site of metastatic disease CT right femur  04/10/2022 -2.2 x 1 cm anterior subcutaneous mass Letrozole  continued CT right femur 07/19/2022-mild enlargement of right thigh subcutaneous mass stable right inguinal nodal mass CTs 07/19/2022-stable right inguinal lymph node, no new or progressive disease 11/20/2022-right thigh mass larger and tender, letrozole  discontinued 11/28/2022-significant enlargement of anterior right thigh soft tissue mass, slightly larger right inguinal mass 12/25/2022-wide local excision of right thigh mass, metastatic endometrial adenocarcinoma, ER positive, PR positive, Her 2 negative(0),negative resection margins Cycle 1 pembrolizumab /lenvatinib  02/05/2023 Cycle 2 Pembrolizumab  02/27/2023, lenvatinib  placed on hold due to mucositis and hypertension Cycle 3 Pembrolizumab  03/20/2023, lenvatinib  resumed at a dose of 10 mg daily Lenvatinib  discontinued 03/30/2023 due to mucositis, hand-foot symptoms Afinitor /letrozole  04/21/2023 CTs 08/10/2023: Increased nodular tissue anterior to the right hip, new 6 mm right lung nodule CT right femur 08/10/2023: No evidence of recurrent tumor at the right anterior thigh, enlargement of right inguinal soft tissue mass with an additional enlarging mass inferiorly Cycle 1 Carboplatin   11/06/2023 Cycle 2 carboplatin  11/26/2023, dose reduction to AUC 4 due to f mild thrombocytopenia, home Decadron  prophylaxis Treatment held due to neutropenia 12/18/2023 Cycle 3 carboplatin  12/25/2023, dose reduction  due to neutropenia following cycle 2, patient declines white cell growth factor support Cycle 4 carboplatin  01/14/2024 CT right femur 02/03/2024-enlarging right inguinal mass CTs 02/03/2024: Previous right lower lobe nodule now measures 0.2 x 0.3 cm-favor benign versus treatment response, increased masslike thickening in the right groin   2.  Left lower extremity lymphedema, improved after therapy in the lymphedema clinic   3.  Chronic diarrhea controlled with Lomotil     Disposition: Ms. Zachow appears unchanged.  She is considering the options she had previously discussed with Dr. Viktoria.  She is undecided as to how she would like to proceed.  We discussed treatment with Doxil.  We reviewed potential side effects including bone marrow toxicity, nausea, hair loss, rash, cardiotoxicity.  She inquired about treatment with Taxotere.  We also discussed potential side effects associated with Taxotere including bone marrow toxicity, nausea, mouth sores, diarrhea, rash, fluid retention, neuropathy.  She has an appointment with Dr. Viktoria tomorrow.  She will contact our office by the end of the week with her decision.  We will plan to see her in follow-up 03/11/2024.  Patient seen with Dr. Cloretta.  Olam Ned ANP/GNP-BC   03/05/2024  10:05 AM  This was a shared visit with Olam Ned.  Ms. Vacca was interviewed and examined.  We discussed treatment options for the progressive uterine carcinoma.  She is scheduled to speak with Dr. Viktoria later today regarding clinical trial eligibility at [facility] and standard options.  We discussed docetaxel and liposomal doxorubicin as standard salvage options.  We reviewed potential toxicities associated with these agents.  She will contact us  after the discussion  with Dr. Viktoria.  I was present for greater than 50% of today's visit.  I performed medical decision making.  Arvella Cloretta, MD

## 2024-03-06 ENCOUNTER — Encounter: Payer: Self-pay | Admitting: Gynecologic Oncology

## 2024-03-06 ENCOUNTER — Inpatient Hospital Stay (HOSPITAL_BASED_OUTPATIENT_CLINIC_OR_DEPARTMENT_OTHER): Admitting: Gynecologic Oncology

## 2024-03-06 DIAGNOSIS — C55 Malignant neoplasm of uterus, part unspecified: Secondary | ICD-10-CM | POA: Diagnosis not present

## 2024-03-06 DIAGNOSIS — Z7189 Other specified counseling: Secondary | ICD-10-CM | POA: Diagnosis not present

## 2024-03-06 NOTE — Progress Notes (Signed)
 Gynecologic Oncology Telehealth Note: Gyn-Onc  I connected with Dana [name] on 03/06/24 at  6:00 PM EDT by telephone and verified that I am speaking with the correct person using two identifiers.  I discussed the limitations, risks, security and privacy concerns of performing an evaluation and management service by telemedicine and the availability of in-person appointments. I also discussed with the patient that there may be a patient responsible charge related to this service. The patient expressed understanding and agreed to proceed.  Other persons participating in the visit and their role in the encounter: none.  Patient's location: home Provider's location: [facility], [place]  Reason for Visit: follow-up  Treatment History: Oncology History Overview Note  ER 90%, PR 95% PD-L1 score 1% MSI Stable   Endometrioid adenocarcinoma of uterus (HCC)  01/05/2014 Initial Diagnosis   The patient has a history of a 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  The patient's initial surgery was with Dr. Toribio Orval Sierra in [place] on January 05, 2014.  It included an exploratory laparotomy with TAH, BSO, appendectomy, pelvic and para-aortic lymphadenectomy and wedge resection of a concerning appearing liver lesion.  Preoperatively she presented with a 26 cm right adnexal mass and an elevated Ca1 25 at 404 units/mL.  At the completion of surgery there was no gross residual disease.  Final pathology showed a primary grade 3 endometrial adenocarcinoma (endometrioid) involving both ovaries.  Lymph nodes and other biopsies were negative.  She had uncomplicated postoperative course.  She was treated with 6 cycles of carboplatin  paclitaxel  chemotherapy.  She was recommended adjuvant radiation therapy however declined this after counseling.  Following her initial therapy she developed severe left lower extremity lymphedema.   Follow-up CT scan in March 2016 showed  no evidence of disease.  She was followed every 3 months for a total of 5 years with no recurrence appreciated during that time   03/28/2019 Imaging   Ct abdomen and pelvis 1. Enlarged right inguinal lymph node or soft tissue mass, measuring 3.8 x 3.5 cm (series 2, image 69). Additional enlarged right iliac lymph node measuring 1.9 x 1.1 cm (series 2, image 68). Findings are concerning for malignancy.   2. Status post hysterectomy per stated history of endometrial malignancy.   3. No other findings in the abdomen or pelvis concerning for malignancy or metastatic disease.   4. There are multiple subcentimeter low-attenuation lesions of the liver, the largest in the left lobe the measuring 7 mm, too small to characterize (series 2, image 24). Attention on follow-up although these are most likely incidental benign small cysts and/or hemangiomata.   5.  Aortic atherosclerosis.   04/10/2019 Pathology Results   A. LYMPH NODE, RIGHT INGUINAL, NEEDLE CORE BIOPSY:  - Metastatic adenocarcinoma.  - See comment.   COMMENT:   The morphology is most consistent with metastatic endometrioid adenocarcinoma.  Immunohistochemistry can be performed if clinically indicated.  There is sufficient tissue for additional testing   04/10/2019 Procedure   Technically successful ultrasound guided biopsy of dominant right inguinal lymph node   04/22/2019 Cancer Staging   Staging form: Corpus Uteri - Carcinoma and Carcinosarcoma, AJCC 8th Edition - Pathologic stage from 04/22/2019: FIGO Stage IVB (rpT3a, pN0, pM1) - Signed by Lonn Hicks, MD on 04/22/2019   04/22/2019 PET scan   1. Two hypermetabolic RIGHT iliac lymph nodes and larger metastatic RIGHT inguinal lymph node. 2. No evidence of metastatic disease outside the pelvis. 3. No clear  evidence of activity at the vaginal cuff.   05/15/2019 Procedure   Placement of a subcutaneous port device. Catheter tip in the lower SVC   05/16/2019 - 06/12/2019 Chemotherapy    The patient had cisplatin  for chemotherapy treatment.      Genetic Testing   Patient has genetic testing done for MSI. Results revealed patient has the following: MSI Stable   06/23/2019 Imaging   1. Interval decreased size of right inguinal and external iliac lymph nodes. 2. No evidence of progressive metastatic disease. 3. Stable small low-density hepatic lesions, likely cysts. 4. Aortic Atherosclerosis (ICD10-I70.0).     07/14/2019 - 04/06/2020 Chemotherapy   The patient had everolimus  and letrozole  for chemotherapy treatment.  Treatment was discontinued due to significant side effects    10/20/2019 Imaging   1. No acute abdominal/pelvic findings, mass lesions or adenopathy. 2. Interval resolution of right iliac lymph node. No residual measurable lymph node is identified. Decrease in size of the right inguinal node. 3. Stable scattered hepatic cysts.   Aortic Atherosclerosis (ICD10-I70.0).   03/08/2020 Imaging   No evidence of recurrent or metastatic carcinoma within the abdomen or pelvis.   Colonic diverticulosis. No radiographic evidence of diverticulitis.   Stable small epigastric ventral hernia containing only fat.     05/31/2020 Imaging   Status post hysterectomy and suspected bilateral salpingo-oophorectomy.   No evidence of recurrent or metastatic disease.   06/22/2020 Procedure   Successful right IJ vein Port-A-Cath explant.     03/11/2021 Imaging   1. There has been interval increase in size of a lymph node or soft tissue mass in the right inguinal station, measuring 3.0 x 2.6 cm, previously 1.9 x 1.7 cm. Interval enlargement of bilateral inguinal lymph nodes. Findings are concerning for nodal metastatic disease. 2. There is a subcutaneous soft tissue nodule of the anterior right thigh measuring 1.6 x 1.0 cm, this level of the thigh not included in the field of view prior examination. This is of uncertain significance, potentially incidental and infectious or  inflammatory, however concerning for malignancy given other findings. Consider targeted ultrasound and tissue sampling. 3. Status post hysterectomy and oophorectomy. 4. Small, fat containing midline epigastric hernia.   Aortic Atherosclerosis (ICD10-I70.0).   04/06/2021 - 05/16/2021 Chemotherapy   Patient is on Treatment Plan : UTERINE Carboplatin  only     07/12/2021 Imaging   Mild decrease in size of bilateral inguinal lymphadenopathy since prior exam.   No new or progressive disease within the abdomen or pelvis.   Colonic diverticulosis. No radiographic evidence of diverticulitis.   Aortic Atherosclerosis (ICD10-I70.0).   02/05/2023 - 03/20/2023 Chemotherapy   Patient is on Treatment Plan : UTERINE Lenvatinib  (20) D1-21 + Pembrolizumab  (200) D1 q21d     11/06/2023 - 01/14/2024 Chemotherapy   Patient is on Treatment Plan : BREAST Carboplatin  (AUC 5) q21d       Interval History: Doing well.  Past Medical/Surgical History: Past Medical History:  Diagnosis Date   Arthritis    GERD (gastroesophageal reflux disease)    History of blood transfusion    Hypothyroidism    Lymphedema    left leg- ted hose makes it worse   Thyroid  disease    Uterine cancer (HCC)    Endometrial cancer - surgery and chemo    Past Surgical History:  Procedure Laterality Date   ABDOMINAL HYSTERECTOMY     APPENDECTOMY     COLONOSCOPY W/ POLYPECTOMY     EXCISION MASS LOWER EXTREMETIES Right 12/25/2022   Procedure: EXCISION  OF RIGHT THIGH MASS, MALIGNANT AND ADVANCEMENT FLAP CLOSURE;  Surgeon: Aron Shoulders, MD;  Location: [facility];  Service: General;  Laterality: Right;   EYE SURGERY Bilateral    cataracts   IR IMAGING GUIDED PORT INSERTION  05/15/2019   IR IMAGING GUIDED PORT INSERTION  10/29/2023   IR REMOVAL TUN ACCESS W/ PORT W/O FL MOD SED  06/23/2020   LIVER BIOPSY     mass removed     2015 from pelvis   TOTAL HIP ARTHROPLASTY Right 07/13/2016   Procedure: RIGHT TOTAL HIP  ARTHROPLASTY ANTERIOR APPROACH;  Surgeon: Kay CHRISTELLA Cummins, MD;  Location: [facility] OR;  Service: Orthopedics;  Laterality: Right;    Family History  Problem Relation Age of Onset   Heart disease Mother    Multiple myeloma Mother    Breast cancer Neg Hx    Ovarian cancer Neg Hx    Pancreatic cancer Neg Hx    Colon cancer Neg Hx    Endometrial cancer Neg Hx    Prostate cancer Neg Hx     Social History   Socioeconomic History   Marital status: Single    Spouse name: Not on file   Number of children: Not on file   Years of education: Not on file   Highest education level: Not on file  Occupational History   Occupation: food Social worker  Tobacco Use   Smoking status: Never   Smokeless tobacco: Never  Vaping Use   Vaping status: Never Used  Substance and Sexual Activity   Alcohol  use: No    Alcohol /week: 0.0 standard drinks of alcohol    Drug use: No   Sexual activity: Not Currently  Other Topics Concern   Not on file  Social History Narrative   Not on file   Social Drivers of Health   Financial Resource Strain: Not on file  Food Insecurity: Not on file  Transportation Needs: Not on file  Physical Activity: Not on file  Stress: Not on file  Social Connections: Not on file    Current Medications:  Current Outpatient Medications:    acetaminophen  (TYLENOL ) 500 MG tablet, Take 500 mg by mouth as needed., Disp: , Rfl:    albuterol (VENTOLIN HFA) 108 (90 Base) MCG/ACT inhaler, Inhale 2 puffs into the lungs every 4 (four) hours as needed., Disp: , Rfl:    dexamethasone  (DECADRON ) 4 MG tablet, Take 1 tablet (4 mg total) by mouth 2 (two) times daily. Take twice daily x 3 days after each chemotherapy treatment (begin on day after each treatment) (Patient not taking: Reported on 03/05/2024), Disp: 24 tablet, Rfl: 1   diphenoxylate -atropine  (LOMOTIL ) 2.5-0.025 MG tablet, TAKE ONE TO TWO TABLETS BY MOUTH FOUR TIMES A DAY AS NEEDED FOR DIARRHEA / LOOSE STOOLS, Disp: 90 tablet, Rfl: 0    HYDROcodone -acetaminophen  (NORCO/VICODIN) 5-325 MG tablet, Take 1 tablet by mouth every 6 (six) hours as needed for moderate pain. (Patient not taking: Reported on 03/05/2024), Disp: 15 tablet, Rfl: 0   levothyroxine  (SYNTHROID ) 75 MCG tablet, Take 75 mcg by mouth daily., Disp: , Rfl:    lidocaine -prilocaine  (EMLA ) cream, Apply 1 Application topically as needed. Apply to portacath 1-2 hours prior to use, Disp: 30 g, Rfl: 3   losartan  (COZAAR ) 25 MG tablet, Take 1 tablet (25 mg total) by mouth daily., Disp: 90 tablet, Rfl: 1   prochlorperazine  (COMPAZINE ) 10 MG tablet, Take 10 mg by mouth every 6 (six) hours as needed for nausea or vomiting., Disp: , Rfl:  No current facility-administered medications for this visit.  Facility-Administered Medications Ordered in Other Visits:    sodium chloride  flush (NS) 0.9 % injection 10 mL, 10 mL, Intravenous, PRN, Debby Olam POUR, NP, 10 mL at 12/18/23 [id number]  Review of Symptoms: Pertinent positives as per HPI.  Physical Exam: Deferred given limitations of phone visit.  Laboratory & Radiologic Studies: None new  Assessment & Plan: Dana [name] is a 73 y.o. woman with recurrent and progressive endometrial cancer.  HER2 negative (0). ER 90%, PR 95%, PD-L1 CPS 1, MSS, TMB-4. No actionable mutations.  Reviewed new information since her last visit: Unfortunately, she is not a candidate for clinical trial at [facility] for patients with PR ovarian and endometrial cancer using Anti-FR and ADC (too many lines of treatment).  She would be a candidate for additional radiation (discussed with Dr. Shannon, he would recommend IMRT if any additional radiation given skin involvement).  We discussed option of getting folate receptor testing. Given data in ovarian and other solid tumors, I think we could consider anti-folate receptor therapy if her tumor is positive. I will message Dr. Cloretta to see if his office has ordered already.  I don't think that MRI will add much  unless we are planning to move forward with excision or radiation.   I discussed the assessment and treatment plan with the patient. The patient was provided with an opportunity to ask questions and all were answered. The patient agreed with the plan and demonstrated an understanding of the instructions.   The patient was advised to call back or see an in-person evaluation if the symptoms worsen or if the condition fails to improve as anticipated.   20 minutes of total time was spent for this patient encounter, including preparation, phone counseling with the patient and coordination of care, and documentation of the encounter.   Comer Dollar, MD  Division of Gynecologic Oncology  Department of Obstetrics and Gynecology  [facility]

## 2024-03-07 ENCOUNTER — Telehealth: Payer: Self-pay | Admitting: Oncology

## 2024-03-07 ENCOUNTER — Telehealth: Payer: Self-pay | Admitting: *Deleted

## 2024-03-07 NOTE — Telephone Encounter (Signed)
 Dana [name] called to report she saw Dr. Viktoria yesterday and was told she is not candidate for the [facility] trial. Was told need to get testing on the last tumor tissue and if positive insurance may pay for the drug that was on the trial (? Folate receptor testing). Was also told MRI would not be useful. Patient thinks her appointment on 03/11/24 is not necessary until the testing is completed and would like to cancel it. Forwarded message to MD.

## 2024-03-07 NOTE — Telephone Encounter (Signed)
 Called [facility] regarding referral and spoke to [place]. She said it had been denied because Dora was established with [place]. In order to transfer care, they would need her past records from [facility] and the doctors would need to approve the referral before she can be scheduled.. They mentioned that Shavanna was also seen in [place].  Advised I will look for the records and will fax them.  Faxed new referral and records from 2016 visit with Dr. Leigh. Unable to find records from [place] because she was seen in 2010.  [facility] and she is not able to remember who she saw in 2010.

## 2024-03-07 NOTE — Telephone Encounter (Signed)
 Dana [name] made aware that Dr. Cloretta has spoken with Dr. Viktoria and the FOLR1 testing has been ordered. Will try to determine the turn-around-time and get her scheduled for when we anticipate results.  8/26 cancelled per her request.

## 2024-03-11 ENCOUNTER — Inpatient Hospital Stay: Admitting: Nurse Practitioner

## 2024-03-18 ENCOUNTER — Encounter: Payer: Self-pay | Admitting: Gastroenterology

## 2024-03-18 ENCOUNTER — Telehealth: Payer: Self-pay | Admitting: Oncology

## 2024-03-18 NOTE — Telephone Encounter (Signed)
 Called [place] and let her know that Margarete called back and have denied the referral.  She would like to see if she can schedule an appointment again with [facility].  Called [facility] and Kaileen is now scheduled on 06/09/24 at 9 am with Dr. Leigh.  Called [place] back and let her know.

## 2024-03-25 ENCOUNTER — Encounter (HOSPITAL_COMMUNITY): Payer: Self-pay | Admitting: Oncology

## 2024-04-01 ENCOUNTER — Other Ambulatory Visit: Payer: Self-pay

## 2024-04-01 DIAGNOSIS — C55 Malignant neoplasm of uterus, part unspecified: Secondary | ICD-10-CM

## 2024-04-01 NOTE — Progress Notes (Deleted)
 [facility]   Telephone:(336) [phone] Fax:(336) [phone]    Patient Care Team: Cloretta Arley NOVAK, MD as PCP - General (Oncology) Cloretta Arley NOVAK, MD as Consulting Physician (Oncology)   CHIEF COMPLAINT: Follow up uterine cancer   CURRENT THERAPY:   INTERVAL HISTORY   ROS   Past Medical History:  Diagnosis Date   Arthritis    GERD (gastroesophageal reflux disease)    History of blood transfusion    Hypothyroidism    Lymphedema    left leg- ted hose makes it worse   Thyroid  disease    Uterine cancer (HCC)    Endometrial cancer - surgery and chemo     Past Surgical History:  Procedure Laterality Date   ABDOMINAL HYSTERECTOMY     APPENDECTOMY     COLONOSCOPY W/ POLYPECTOMY     EXCISION MASS LOWER EXTREMETIES Right 12/25/2022   Procedure: EXCISION OF RIGHT THIGH MASS, MALIGNANT AND ADVANCEMENT FLAP CLOSURE;  Surgeon: Aron Shoulders, MD;  Location: [facility];  Service: General;  Laterality: Right;   EYE SURGERY Bilateral    cataracts   IR IMAGING GUIDED PORT INSERTION  05/15/2019   IR IMAGING GUIDED PORT INSERTION  10/29/2023   IR REMOVAL TUN ACCESS W/ PORT W/O FL MOD SED  06/23/2020   LIVER BIOPSY     mass removed     2015 from pelvis   TOTAL HIP ARTHROPLASTY Right 07/13/2016   Procedure: RIGHT TOTAL HIP ARTHROPLASTY ANTERIOR APPROACH;  Surgeon: Kay CHRISTELLA Cummins, MD;  Location: [facility];  Service: Orthopedics;  Laterality: Right;     Outpatient Encounter Medications as of 04/02/2024  Medication Sig   acetaminophen  (TYLENOL ) 500 MG tablet Take 500 mg by mouth as needed.   albuterol (VENTOLIN HFA) 108 (90 Base) MCG/ACT inhaler Inhale 2 puffs into the lungs every 4 (four) hours as needed.   dexamethasone  (DECADRON ) 4 MG tablet Take 1 tablet (4 mg total) by mouth 2 (two) times daily. Take twice daily x 3 days after each chemotherapy treatment (begin on day after each treatment) (Patient not taking: Reported on 03/05/2024)   diphenoxylate -atropine   (LOMOTIL ) 2.5-0.025 MG tablet TAKE ONE TO TWO TABLETS BY MOUTH FOUR TIMES A DAY AS NEEDED FOR DIARRHEA / LOOSE STOOLS   HYDROcodone -acetaminophen  (NORCO/VICODIN) 5-325 MG tablet Take 1 tablet by mouth every 6 (six) hours as needed for moderate pain. (Patient not taking: Reported on 03/05/2024)   levothyroxine  (SYNTHROID ) 75 MCG tablet Take 75 mcg by mouth daily.   lidocaine -prilocaine  (EMLA ) cream Apply 1 Application topically as needed. Apply to portacath 1-2 hours prior to use   losartan  (COZAAR ) 25 MG tablet Take 1 tablet (25 mg total) by mouth daily.   prochlorperazine  (COMPAZINE ) 10 MG tablet Take 10 mg by mouth every 6 (six) hours as needed for nausea or vomiting.   Facility-Administered Encounter Medications as of 04/02/2024  Medication   sodium chloride  flush (NS) 0.9 % injection 10 mL     There were no vitals filed for this visit. There is no height or weight on file to calculate BMI.   ECOG PERFORMANCE STATUS: {CHL ONC ECOG [phone]}  PHYSICAL EXAM GENERAL:alert, no distress and comfortable SKIN: no rash  EYES: sclera clear NECK: without mass LYMPH:  no palpable cervical or supraclavicular lymphadenopathy  LUNGS: clear with normal breathing effort HEART: regular rate & rhythm, no lower extremity edema ABDOMEN: abdomen soft, non-tender and normal bowel sounds NEURO: alert & oriented x 3 with fluent speech,  no focal motor/sensory deficits Breast exam:  PAC without erythema    CBC    Latest Ref Rng & Units 02/04/2024   10:20 AM 01/14/2024    8:49 AM 12/25/2023   10:03 AM  CBC  WBC 4.0 - 10.5 K/uL 3.8  3.8  3.5   Hemoglobin 12.0 - 15.0 g/dL 88.0  87.6  87.6   Hematocrit 36.0 - 46.0 % 34.8  35.7  35.3   Platelets 150 - 400 K/uL 183  207  274       CMP     Latest Ref Rng & Units 02/04/2024   10:20 AM 01/14/2024    8:49 AM 12/25/2023   10:03 AM  CMP  Glucose 70 - 99 mg/dL 90  91  80   BUN 8 - 23 mg/dL 11  9  11    Creatinine 0.44 - 1.00 mg/dL 9.19  9.19  9.14    Sodium 135 - 145 mmol/L 138  136  138   Potassium 3.5 - 5.1 mmol/L 4.0  4.0  4.1   Chloride 98 - 111 mmol/L 102  103  103   CO2 22 - 32 mmol/L 24  24  23    Calcium 8.9 - 10.3 mg/dL 9.2  8.7  8.7   Total Protein 6.5 - 8.1 g/dL 6.4  6.2  6.2   Total Bilirubin 0.0 - 1.2 mg/dL 0.3  0.4  0.3   Alkaline Phos 38 - 126 U/L 71  69  75   AST 15 - 41 U/L 20  19  20    ALT 0 - 44 U/L 14  11  11        ASSESSMENT & PLAN: 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, status post a TAH, BSO, appendectomy, pelvic and periaortic lymphadenectomy, and wedge resection of a liver lesion Gross complete resection, tumor involve both ovaries, lymph nodes negative 6 cycles of adjuvant Taxol /carboplatin  CT abdomen/pelvis 03/28/2019-enlarged right inguinal and iliac nodes 04/10/2019-right inguinal lymph node biopsy-metastatic adenocarcinoma, ER 90%, PR 95%, PD-L1 combined positive score-1, MSS, tumor mutation burden-4 Cisplatin  05/16/2019 20-1 cycle Radiation to the lateral pelvis and 1 lymph nodes November 2020 CT 12/72/020-decrease size of right inguinal and external iliac lymph nodes, no evidence of disease progression Everolimus  and letrozole  07/14/2019 - 04/06/2020 CT 10/20/2019-resolution of right iliac lymph node, decrease in size of right inguinal node CT 03/08/2020-no evidence of recurrent or metastatic disease in the abdomen or pelvis CT 05/31/2020-no evidence of recurrent or metastatic disease CT 03/11/2021-increase right inguinal lymph node, enlargement of bilateral inguinal lymph nodes, subcutaneous soft tissue nodule at the anterior right thigh Carboplatin  04/06/2021, 04/25/2021, 05/16/2021 CT 07/12/2021-mild decrease in size of bilateral inguinal lymphadenopathy, no progressive disease in the abdomen or pelvis, 0.6 cm left inguinal node was 0.8 cm previously Letrozole  08/06/2021 CT abdomen/pelvis 04/10/2022-stable right inguinal lymph node, no new site of metastatic disease CT  right femur  04/10/2022 -2.2 x 1 cm anterior subcutaneous mass Letrozole  continued CT right femur 07/19/2022-mild enlargement of right thigh subcutaneous mass stable right inguinal nodal mass CTs 07/19/2022-stable right inguinal lymph node, no new or progressive disease 11/20/2022-right thigh mass larger and tender, letrozole  discontinued 11/28/2022-significant enlargement of anterior right thigh soft tissue mass, slightly larger right inguinal mass 12/25/2022-wide local excision of right thigh mass, metastatic endometrial adenocarcinoma, ER positive, PR positive, Her 2 negative(0),negative resection margins Cycle 1 pembrolizumab /lenvatinib  02/05/2023 Cycle 2 Pembrolizumab  02/27/2023, lenvatinib  placed on hold due to mucositis and hypertension Cycle 3 Pembrolizumab  03/20/2023, lenvatinib  resumed  at a dose of 10 mg daily Lenvatinib  discontinued 03/30/2023 due to mucositis, hand-foot symptoms Afinitor /letrozole  04/21/2023 CTs 08/10/2023: Increased nodular tissue anterior to the right hip, new 6 mm right lung nodule CT right femur 08/10/2023: No evidence of recurrent tumor at the right anterior thigh, enlargement of right inguinal soft tissue mass with an additional enlarging mass inferiorly Cycle 1 Carboplatin  11/06/2023 Cycle 2 carboplatin  11/26/2023, dose reduction to AUC 4 due to f mild thrombocytopenia, home Decadron  prophylaxis Treatment held due to neutropenia 12/18/2023 Cycle 3 carboplatin  12/25/2023, dose reduction due to neutropenia following cycle 2, patient declines white cell growth factor support Cycle 4 carboplatin  01/14/2024 CT right femur 02/03/2024-enlarging right inguinal mass CTs 02/03/2024: Previous right lower lobe nodule now measures 0.2 x 0.3 cm-favor benign versus treatment response, increased masslike thickening in the right groin   2.  Left lower extremity lymphedema, improved after therapy in the lymphedema clinic   3.  Chronic diarrhea controlled with Lomotil    PLAN:  No orders of the  defined types were placed in this encounter.     All questions were answered. The patient knows to call the clinic with any problems, questions or concerns. No barriers to learning were detected. I spent *** counseling the patient face to face. The total time spent in the appointment was *** and more than 50% was on counseling, review of test results, and coordination of care.   Luvina Poirier K Hedda Crumbley, NP 04/01/2024 5:20 PM

## 2024-04-02 ENCOUNTER — Ambulatory Visit: Admitting: Nurse Practitioner

## 2024-04-02 ENCOUNTER — Inpatient Hospital Stay

## 2024-04-02 ENCOUNTER — Other Ambulatory Visit: Payer: Self-pay

## 2024-04-02 DIAGNOSIS — C55 Malignant neoplasm of uterus, part unspecified: Secondary | ICD-10-CM

## 2024-04-03 ENCOUNTER — Inpatient Hospital Stay

## 2024-04-03 ENCOUNTER — Inpatient Hospital Stay: Attending: Hematology and Oncology | Admitting: Nurse Practitioner

## 2024-04-03 ENCOUNTER — Encounter: Payer: Self-pay | Admitting: Nurse Practitioner

## 2024-04-03 ENCOUNTER — Telehealth: Payer: Self-pay

## 2024-04-03 VITALS — BP 118/83 | HR 72 | Temp 98.1°F | Resp 16 | Wt 169.8 lb

## 2024-04-03 DIAGNOSIS — C7989 Secondary malignant neoplasm of other specified sites: Secondary | ICD-10-CM | POA: Insufficient documentation

## 2024-04-03 DIAGNOSIS — C55 Malignant neoplasm of uterus, part unspecified: Secondary | ICD-10-CM

## 2024-04-03 DIAGNOSIS — R112 Nausea with vomiting, unspecified: Secondary | ICD-10-CM | POA: Diagnosis not present

## 2024-04-03 DIAGNOSIS — Z7189 Other specified counseling: Secondary | ICD-10-CM | POA: Diagnosis not present

## 2024-04-03 DIAGNOSIS — C778 Secondary and unspecified malignant neoplasm of lymph nodes of multiple regions: Secondary | ICD-10-CM | POA: Insufficient documentation

## 2024-04-03 DIAGNOSIS — Z90722 Acquired absence of ovaries, bilateral: Secondary | ICD-10-CM | POA: Diagnosis not present

## 2024-04-03 DIAGNOSIS — Z90710 Acquired absence of both cervix and uterus: Secondary | ICD-10-CM | POA: Diagnosis not present

## 2024-04-03 DIAGNOSIS — C541 Malignant neoplasm of endometrium: Secondary | ICD-10-CM | POA: Diagnosis present

## 2024-04-03 LAB — CBC WITH DIFFERENTIAL (CANCER CENTER ONLY)
Abs Immature Granulocytes: 0.01 K/uL (ref 0.00–0.07)
Basophils Absolute: 0 K/uL (ref 0.0–0.1)
Basophils Relative: 0 %
Eosinophils Absolute: 0.3 K/uL (ref 0.0–0.5)
Eosinophils Relative: 6 %
HCT: 38.3 % (ref 36.0–46.0)
Hemoglobin: 13 g/dL (ref 12.0–15.0)
Immature Granulocytes: 0 %
Lymphocytes Relative: 19 %
Lymphs Abs: 1.1 K/uL (ref 0.7–4.0)
MCH: 34.6 pg — ABNORMAL HIGH (ref 26.0–34.0)
MCHC: 33.9 g/dL (ref 30.0–36.0)
MCV: 101.9 fL — ABNORMAL HIGH (ref 80.0–100.0)
Monocytes Absolute: 0.6 K/uL (ref 0.1–1.0)
Monocytes Relative: 10 %
Neutro Abs: 3.8 K/uL (ref 1.7–7.7)
Neutrophils Relative %: 65 %
Platelet Count: 244 K/uL (ref 150–400)
RBC: 3.76 MIL/uL — ABNORMAL LOW (ref 3.87–5.11)
RDW: 11.9 % (ref 11.5–15.5)
WBC Count: 5.8 K/uL (ref 4.0–10.5)
nRBC: 0 % (ref 0.0–0.2)

## 2024-04-03 LAB — CMP (CANCER CENTER ONLY)
ALT: 9 U/L (ref 0–44)
AST: 20 U/L (ref 15–41)
Albumin: 4 g/dL (ref 3.5–5.0)
Alkaline Phosphatase: 65 U/L (ref 38–126)
Anion gap: 12 (ref 5–15)
BUN: 8 mg/dL (ref 8–23)
CO2: 23 mmol/L (ref 22–32)
Calcium: 9.2 mg/dL (ref 8.9–10.3)
Chloride: 98 mmol/L (ref 98–111)
Creatinine: 0.77 mg/dL (ref 0.44–1.00)
GFR, Estimated: >60 mL/min (ref >60)
Glucose, Bld: 91 mg/dL (ref 70–99)
Potassium: 4 mmol/L (ref 3.5–5.1)
Sodium: 134 mmol/L — ABNORMAL LOW (ref 135–145)
Total Bilirubin: 0.4 mg/dL (ref 0.0–1.2)
Total Protein: 6.3 g/dL — ABNORMAL LOW (ref 6.5–8.1)

## 2024-04-03 LAB — T4, FREE: Free T4: 0.85 ng/dL (ref 0.61–1.12)

## 2024-04-03 NOTE — Progress Notes (Signed)
 DISCONTINUE OFF PATHWAY REGIMEN - Uterine   [id number]:Carboplatin  AUC=5 IV D1 q21 Days:   A cycle is every 21 days:     Carboplatin    **Always confirm dose/schedule in your pharmacy ordering system**  PRIOR TREATMENT: Off Pathway: Carboplatin  AUC=5 IV D1 q21 Days  START OFF PATHWAY REGIMEN - Uterine   [id number]:Liposomal Doxorubicin 40 mg/m2 IV D1 q28 Days:   A cycle is every 28 days:     Liposomal doxorubicin   **Always confirm dose/schedule in your pharmacy ordering system**  Patient Characteristics: Endometrioid, Recurrent/Progressive Disease, Third Line and Beyond, MSS/pMMR, HER2 Negative/Unknown Histology: Endometrioid Therapeutic Status: Recurrent or Progressive Disease Microsatellite/Mismatch Repair Status: MSS/pMMR Line of Therapy: Third Line and Beyond HER2 Expression Status by IHC: Negative (IHC 0, 1+) Intent of Therapy: Non-Curative / Palliative Intent, Discussed with Patient

## 2024-04-03 NOTE — Telephone Encounter (Signed)
 Pt scheduled to see Alan Coombs PA 04/10/24@8 :40am. Left detailed message on pt identified voicemail regarding appt.

## 2024-04-03 NOTE — Telephone Encounter (Signed)
-----   Message from Nurse Patty P sent at 04/03/2024  9:35 AM EDT ----- I will send to Dr Hassan team. ----- Message ----- From: Burton, Lacie K, NP Sent: 04/03/2024   9:19 AM EDT To: Odetta LITTIE Curly, RN; Elspeth SHAUNNA Naval, MD  Hi Dr. Naval,  This nice lady has endometrial cancer and is gearing up to start doxil chemo in the next few weeks. She has complicating GI issues and is scheduled to see you in November. Specifically, she has periodic n/v/d that was occurring monthly for several years, now occurring weekly for past few months. In hopes of getting her in good shape to start chemo, is there any way you or your team can see her sooner? We are getting a CT AP just to make sure this isn't cancer related.   Thanks for considering, Lacie

## 2024-04-03 NOTE — Patient Instructions (Signed)

## 2024-04-03 NOTE — Progress Notes (Addendum)
 [facility]   Telephone:(336) [phone] Fax:(336) [phone]    Patient Care Team: Cloretta Arley NOVAK, MD as PCP - General (Oncology) Cloretta Arley NOVAK, MD as Consulting Physician (Oncology)   CHIEF COMPLAINT: Follow up endometrial cancer   CURRENT THERAPY: PENDING systemic therapy with Doxil day 1 q28 days   INTERVAL HISTORY Dana [name] returns for follow up to discuss treatment. Last seen 03/05/24. Since prior radiation she has been having episodic n/v/d that occurs monthly or q6-7 weeks although recently this occurs more frequently, with an episode last week and again 2 nights ago. She gets abdominal spasm followed by 5-6 hours of n/v/d until everything is out of her system. Compazine  and lomotil  don't help in these episodes. No recent infection or antibiotics. She's drinking but not eating much. On good days she can do house work, work outside, and run some errands. Right groin mass is stable, sore, and draining clear fluid without much bleeding. Denies fever/chills, cough, chest pain, dyspnea, or other new concerns.   ROS  All other systems reviewed and negative   Past Medical History:  Diagnosis Date   Arthritis    GERD (gastroesophageal reflux disease)    History of blood transfusion    Hypothyroidism    Lymphedema    left leg- ted hose makes it worse   Thyroid  disease    Uterine cancer (HCC)    Endometrial cancer - surgery and chemo     Past Surgical History:  Procedure Laterality Date   ABDOMINAL HYSTERECTOMY     APPENDECTOMY     COLONOSCOPY W/ POLYPECTOMY     EXCISION MASS LOWER EXTREMETIES Right 12/25/2022   Procedure: EXCISION OF RIGHT THIGH MASS, MALIGNANT AND ADVANCEMENT FLAP CLOSURE;  Surgeon: Aron Shoulders, MD;  Location: [facility];  Service: General;  Laterality: Right;   EYE SURGERY Bilateral    cataracts   IR IMAGING GUIDED PORT INSERTION  05/15/2019   IR IMAGING GUIDED PORT INSERTION  10/29/2023   IR REMOVAL TUN ACCESS W/ PORT  W/O FL MOD SED  06/23/2020   LIVER BIOPSY     mass removed     2015 from pelvis   TOTAL HIP ARTHROPLASTY Right 07/13/2016   Procedure: RIGHT TOTAL HIP ARTHROPLASTY ANTERIOR APPROACH;  Surgeon: Kay CHRISTELLA Cummins, MD;  Location: [facility];  Service: Orthopedics;  Laterality: Right;     Outpatient Encounter Medications as of 04/03/2024  Medication Sig   acetaminophen  (TYLENOL ) 500 MG tablet Take 500 mg by mouth as needed.   diphenoxylate -atropine  (LOMOTIL ) 2.5-0.025 MG tablet TAKE ONE TO TWO TABLETS BY MOUTH FOUR TIMES A DAY AS NEEDED FOR DIARRHEA / LOOSE STOOLS   levothyroxine  (SYNTHROID ) 75 MCG tablet Take 75 mcg by mouth daily.   lidocaine -prilocaine  (EMLA ) cream Apply 1 Application topically as needed. Apply to portacath 1-2 hours prior to use   losartan  (COZAAR ) 25 MG tablet Take 1 tablet (25 mg total) by mouth daily.   prochlorperazine  (COMPAZINE ) 10 MG tablet Take 10 mg by mouth every 6 (six) hours as needed for nausea or vomiting.   albuterol (VENTOLIN HFA) 108 (90 Base) MCG/ACT inhaler Inhale 2 puffs into the lungs every 4 (four) hours as needed. (Patient not taking: Reported on 04/03/2024)   HYDROcodone -acetaminophen  (NORCO/VICODIN) 5-325 MG tablet Take 1 tablet by mouth every 6 (six) hours as needed for moderate pain. (Patient not taking: Reported on 04/03/2024)   [DISCONTINUED] dexamethasone  (DECADRON ) 4 MG tablet Take 1 tablet (4 mg total) by  mouth 2 (two) times daily. Take twice daily x 3 days after each chemotherapy treatment (begin on day after each treatment) (Patient not taking: Reported on 03/05/2024)   Facility-Administered Encounter Medications as of 04/03/2024  Medication   sodium chloride  flush (NS) 0.9 % injection 10 mL     Today's Vitals   04/03/24 0806 04/03/24 0816  BP: 118/83   Pulse: 72   Resp: 16   Temp: 98.1 F (36.7 C)   TempSrc: Temporal   SpO2: 98%   Weight: 169 lb 12.8 oz (77 kg)   PainSc:  2    Body mass index is 32.08 kg/m.   ECOG PERFORMANCE STATUS: 1 -  Symptomatic but completely ambulatory  PHYSICAL EXAM GENERAL:alert, no distress and comfortable SKIN: no rash  EYES: sclera clear NECK: without mass LYMPH:  no palpable cervical or supraclavicular lymphadenopathy  LUNGS: clear with normal breathing effort HEART: regular rate & rhythm, left leg lymphedema  ABDOMEN: abdomen soft, non-tender and normal bowel sounds. R groin mass approx 6x3 cm and 2 cm subcutaneous mass on the anterior high R thigh  NEURO: alert & oriented x 3 with fluent speech, no focal motor/sensory deficits PAC without erythema    CBC    Latest Ref Rng & Units 04/03/2024    9:30 AM 02/04/2024   10:20 AM 01/14/2024    8:49 AM  CBC  WBC 4.0 - 10.5 K/uL 5.8  3.8  3.8   Hemoglobin 12.0 - 15.0 g/dL 86.9  88.0  87.6   Hematocrit 36.0 - 46.0 % 38.3  34.8  35.7   Platelets 150 - 400 K/uL 244  183  207       CMP     Latest Ref Rng & Units 04/03/2024    9:30 AM 02/04/2024   10:20 AM 01/14/2024    8:49 AM  CMP  Glucose 70 - 99 mg/dL 91  90  91   BUN 8 - 23 mg/dL 8  11  9    Creatinine 0.44 - 1.00 mg/dL 9.22  9.19  9.19   Sodium 135 - 145 mmol/L 134  138  136   Potassium 3.5 - 5.1 mmol/L 4.0  4.0  4.0   Chloride 98 - 111 mmol/L 98  102  103   CO2 22 - 32 mmol/L 23  24  24    Calcium 8.9 - 10.3 mg/dL 9.2  9.2  8.7   Total Protein 6.5 - 8.1 g/dL 6.3  6.4  6.2   Total Bilirubin 0.0 - 1.2 mg/dL 0.4  0.3  0.4   Alkaline Phos 38 - 126 U/L 65  71  69   AST 15 - 41 U/L 20  20  19    ALT 0 - 44 U/L 9  14  11        ASSESSMENT & PLAN:  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, status post a TAH, BSO, appendectomy, pelvic and periaortic lymphadenectomy, and wedge resection of a liver lesion Gross complete resection, tumor involve both ovaries, lymph nodes negative 6 cycles of adjuvant Taxol /carboplatin  CT abdomen/pelvis 03/28/2019-enlarged right inguinal and iliac nodes 04/10/2019-right inguinal lymph node biopsy-metastatic adenocarcinoma, ER  90%, PR 95%, PD-L1 combined positive score-1, MSS, tumor mutation burden-4 Cisplatin  05/16/2019 20-1 cycle Radiation to the lateral pelvis and 1 lymph nodes November 2020 CT 12/72/020-decrease size of right inguinal and external iliac lymph nodes, no evidence of disease progression Everolimus  and letrozole  07/14/2019 - 04/06/2020 CT 10/20/2019-resolution of right iliac lymph node, decrease  in size of right inguinal node CT 03/08/2020-no evidence of recurrent or metastatic disease in the abdomen or pelvis CT 05/31/2020-no evidence of recurrent or metastatic disease CT 03/11/2021-increase right inguinal lymph node, enlargement of bilateral inguinal lymph nodes, subcutaneous soft tissue nodule at the anterior right thigh Carboplatin  04/06/2021, 04/25/2021, 05/16/2021 CT 07/12/2021-mild decrease in size of bilateral inguinal lymphadenopathy, no progressive disease in the abdomen or pelvis, 0.6 cm left inguinal node was 0.8 cm previously Letrozole  08/06/2021 CT abdomen/pelvis 04/10/2022-stable right inguinal lymph node, no new site of metastatic disease CT right femur  04/10/2022 -2.2 x 1 cm anterior subcutaneous mass Letrozole  continued CT right femur 07/19/2022-mild enlargement of right thigh subcutaneous mass stable right inguinal nodal mass CTs 07/19/2022-stable right inguinal lymph node, no new or progressive disease 11/20/2022-right thigh mass larger and tender, letrozole  discontinued 11/28/2022-significant enlargement of anterior right thigh soft tissue mass, slightly larger right inguinal mass 12/25/2022-wide local excision of right thigh mass, metastatic endometrial adenocarcinoma, ER positive, PR positive, Her 2 negative(0),negative resection margins Cycle 1 pembrolizumab /lenvatinib  02/05/2023 Cycle 2 Pembrolizumab  02/27/2023, lenvatinib  placed on hold due to mucositis and hypertension Cycle 3 Pembrolizumab  03/20/2023, lenvatinib  resumed at a dose of 10 mg daily Lenvatinib  discontinued 03/30/2023 due to  mucositis, hand-foot symptoms Afinitor /letrozole  04/21/2023 CTs 08/10/2023: Increased nodular tissue anterior to the right hip, new 6 mm right lung nodule CT right femur 08/10/2023: No evidence of recurrent tumor at the right anterior thigh, enlargement of right inguinal soft tissue mass with an additional enlarging mass inferiorly Cycle 1 Carboplatin  11/06/2023 Cycle 2 carboplatin  11/26/2023, dose reduction to AUC 4 due to f mild thrombocytopenia, home Decadron  prophylaxis Treatment held due to neutropenia 12/18/2023 Cycle 3 carboplatin  12/25/2023, dose reduction due to neutropenia following cycle 2, patient declines white cell growth factor support Cycle 4 carboplatin  01/14/2024 CT right femur 02/03/2024-enlarging right inguinal mass CTs 02/03/2024: Previous right lower lobe nodule now measures 0.2 x 0.3 cm-favor benign versus treatment response, increased masslike thickening in the right groin 03/25/24: [employer]: FOLR1 negative (5%)   2.  Left lower extremity lymphedema, improved after therapy in the lymphedema clinic   3.  Chronic diarrhea controlled with Lomotil    Disposition:  Dana [name] appears stable. We reviewed the current situation. Surgery is not being recommended, she is not a candidate for clinical trial at [facility] or anti-folate receptor therapy, and has declined radiation. She is open to the recommendation to start systemic therapy with Doxil. Potential SEs including bone marrow toxicity, nausea, hair loss, rash, and cardiotoxicity were discussed. She agrees to proceed.   She has periodic n/v/d that is progressive. She has lost weight. No chemo since 12/2023 or obvious infection/antibiotics. The etiology of her GI symptoms is unclear. GI has kindly agreed to see her next week. We will arrange CT AP to r/o obstruction or other cancer related issue.   She will complete echo prior to starting doxil and we will see her back with lab with cycle 1. She will return for nadir labs 7-10 days later.    Patient seen with Dr. Cloretta.    Orders Placed This Encounter  Procedures   CT ABDOMEN PELVIS W CONTRAST    Standing Status:   Future    Expected Date:   04/17/2024    Expiration Date:   04/03/2025    If indicated for the ordered procedure, I authorize the administration of contrast media per Radiology protocol:   Yes    Does the patient have a contrast media/X-ray dye allergy?:   No  Preferred imaging location?:   [facility]    If indicated for the ordered procedure, I authorize the administration of oral contrast media per Radiology protocol:   Yes   ECHOCARDIOGRAM COMPLETE    Standing Status:   Future    Expected Date:   04/17/2024    Expiration Date:   04/03/2025    Where should this test be performed:   [facility]    Perflutren DEFINITY (image enhancing agent) should be administered unless hypersensitivity or allergy exist:   Administer Perflutren    Reason for exam-Echo:   Chemo  Z09      All questions were answered. The patient knows to call the clinic with any problems, questions or concerns. No barriers to learning were detected.   Dana Weinrich K Sueko Dimichele, NP 04/03/2024   This was a shared visit with Ralston Venus.  Dana [name] was interviewed and examined.  We discussed treatment options for the progressive metastatic endometrial cancer.  She is not eligible for a clinical trial at [facility].  She does not wish to consider surgery or radiation.  I recommend Doxil.  We reviewed potential toxicities associated with Doxil including the chance of cardiac toxicity, nausea, hematologic toxicity, infection, bleeding, alopecia, rash, and hand/foot syndrome.  She agrees to proceed. She has intermittent nausea and vomiting of unclear etiology.  We will contact gastroenterology to request an expedited appointment.  She will be referred for restaging CTs and return for an office visit prior to beginning Doxil.  A treatment plan was entered today.  I was present for greater than  50% of today's visit.  I performed medical decision making.  Arvella Hof, MD

## 2024-04-04 ENCOUNTER — Telehealth: Payer: Self-pay | Admitting: *Deleted

## 2024-04-04 ENCOUNTER — Other Ambulatory Visit: Payer: Self-pay

## 2024-04-04 ENCOUNTER — Encounter: Payer: Self-pay | Admitting: Oncology

## 2024-04-04 NOTE — Telephone Encounter (Signed)
 Dana [name] called to report she is struggling with cost of her care. She has not met her out of pocket max yet this year. Wants to postpone the CT scan ($300 her cost). She agrees to the ECHO now since it is needed to receive chemo ($300 her cost). She is sure after seeing GI on 9/25 they will order a procedure. Asking for the cost of Doxil (80 mg) because her insurance requires her to pay 20% of this cost. Sent message to pharmacist to see if chemo cost can be provided.

## 2024-04-08 ENCOUNTER — Telehealth: Payer: Self-pay | Admitting: Oncology

## 2024-04-08 ENCOUNTER — Encounter: Payer: Self-pay | Admitting: Oncology

## 2024-04-08 NOTE — Telephone Encounter (Signed)
 Was informed by Dana [name] in managed care that cost of her liposomal doxorubicin is $9750 per treatment. Dana [name] made aware and said if there is no patient assistance available for this, she will not be able to afford therapy. She also adds that  ECHO is not until 04/29/24, so should she move the chemo out to afterwards? Sent email for pharmacy-IV chemo assistance staff member to research this for her.

## 2024-04-08 NOTE — Telephone Encounter (Signed)
 Called PT to let her know that appt would be pushed out, PT preferred to come in on 10/21. Day and time confirmed.

## 2024-04-09 NOTE — Progress Notes (Unsigned)
 04/10/2024 Dana [name] [id number] October 22, 1950  Referring provider: Cloretta Arley NOVAK, MD Primary GI doctor: Dr. Leigh  ASSESSMENT AND PLAN:  Diarrhea feels started after radiation 2020 per patient 2010 colonoscopy in [facility] report not available Negative celiac 2016 Has been on Lomotil  which helps however she has sick spells LLQ pain better if she does not eat, will radiate to middle AB, will have nausea/dry heaving/vomiting for 4-5 hours that will help, then diarrhea for the other 4-5 hours with improvement.  Last chemotherapy 12/2023 CTA 02/03/2024 Atheromatous plaque at the origins of the celiac trunk and SMA without critical stenosis or overt occlusion ? Gastroparesis, MALS, obstruction from radiation, colitis -Pending CTAP W from oncology - Schedule endoscopy and colonoscopy to evaluate for gastritis, colitis, obstruction, etc. Risk of bowel prep, conscious sedation, and EGD and colonoscopy were discussed. Risks include but are not limited to dehydration, pain, bleeding, cardiopulmonary process, bowel perforation, or other possible adverse outcomes..  Treatment plan was discussed with patient, and agreed upon. - Order mesenteric artery ultrasound. - Prescribe Pepcid  (famotidine ) for 2-4 weeks. - Order stool studies including pancreatic elastase and infection markers. - Prescribe hyoscyamine  sublingual as needed. - consider SIBO test or treatment - consider UGI with small bowel follow through - gastroparesis diet given, consider GES  Periodic nausea and vomiting with history of GERD Negative celiac 2016 On Compazine  Add on pepcid  at night to see if this helps, intolerant to Ppi's Schedule EGD for evaluation Gastroparesis diet given, consider GES after EGD  Endometrial cancer with history of metastasis 2015 status post TAH/BSO appendectomy lymphadenectomy and wedge resection of liver lesion 2020 reoccurrence right inguinal and iliac nodes status post systemic  chemotherapy and radiation at that time 2023 2024 right subcutaneous mass metastatic endometrial adenocarcinoma repeated cycle chemotherapy throughout 2024 and 2025 02/03/2024 right lower lobe nodule enlarging right inguinal mass thickening in the right groin Last chemotherapy 12/2023 Follows with Dr. Cloretta pending initiation of Doxorubicin q 28 days  Patient Care Team: Dana Arley NOVAK, MD as PCP - General (Oncology) Dana Arley NOVAK, MD as Consulting Physician (Oncology)  HISTORY OF PRESENT ILLNESS: 73 y.o. female with a past medical history listed below presents for evaluation of nausea, vomiting AB pain and diarrhea episodic.   Patient remotely seen by Dr. Leigh November 2016 for hemi positive stools diarrhea and GERD.  Negative celiac and fecal calprotectin at that visit, declined procedures.  Discussed the use of AI scribe software for clinical note transcription with the patient, who gave verbal consent to proceed.  History of Present Illness   Dana [name] is a 73 year old female with endometrial cancer who presents with gastrointestinal symptoms including diarrhea, nausea, and vomiting.  She has been experiencing gastrointestinal symptoms, including diarrhea, nausea, and vomiting, which have worsened over the past few months. Episodes begin with left-sided abdominal pain that sometimes radiates to the middle of her stomach, leading to severe pain, nausea, and retching. Vomiting occurs several hours later, providing relief, followed by diarrhea lasting several hours. These episodes have increased in frequency and severity.  Her history of endometrial cancer, diagnosed in 2015, includes local recurrences and several cycles of chemotherapy. Radiation therapy in 2020 exacerbated her diarrhea, leading to significant weakness. She manages her symptoms with dietary changes, such as the BRAT diet and daily bananas, but finds it challenging to maintain. Lomotil  provides some relief, but  she cannot rely on it long-term.  Her diet is severely restricted due to fear of triggering  symptoms. She consumes small amounts of bland foods like cookies, crackers, rice, and boiled potatoes, occasionally eating chicken or hot dogs, which often result in symptoms. She avoids foods that are hard to digest, such as olives and cashews, which have previously triggered symptoms.  No current heartburn or reflux, but she reports a gurgling sensation when her stomach is empty. No difficulty swallowing, though she sometimes needs water to help food go down. No black or bloody stools and rectal pain, though she feels discomfort where her tumors are located.  Her bowel movements are mostly formed outside of episodes, occurring daily or every other day. Diarrhea is sometimes random, triggered by hard-to-digest foods. She has not had a colonoscopy since 2010 and expresses concern about undergoing the procedure due to her diarrhea.  She has experienced weight changes, but these are difficult to assess due to lymphedema. She notes a reduction in size stat-wise compared to previous years.      She  reports that she has never smoked. She has never used smokeless tobacco. She reports that she does not drink alcohol  and does not use drugs.  RELEVANT GI HISTORY, IMAGING AND LABS: Results   RADIOLOGY CT angiography: Plaque at celiac trunk and superior mesenteric artery (SMA) without critical stenosis or overt occlusion (01/2024)      CBC    Component Value Date/Time   WBC 5.8 04/03/2024 0930   WBC 3.2 (L) 05/16/2021 1043   RBC 3.76 (L) 04/03/2024 0930   HGB 13.0 04/03/2024 0930   HCT 38.3 04/03/2024 0930   PLT 244 04/03/2024 0930   MCV 101.9 (H) 04/03/2024 0930   MCH 34.6 (H) 04/03/2024 0930   MCHC 33.9 04/03/2024 0930   RDW 11.9 04/03/2024 0930   LYMPHSABS 1.1 04/03/2024 0930   MONOABS 0.6 04/03/2024 0930   EOSABS 0.3 04/03/2024 0930   BASOSABS 0.0 04/03/2024 0930   Recent Labs    07/17/23 1010  08/15/23 1014 10/05/23 1420 11/06/23 0911 11/26/23 1120 12/18/23 0835 12/25/23 1003 01/14/24 0849 02/04/24 1020 04/03/24 0930  HGB 13.1 13.5 13.8 13.6 12.3 12.1 12.3 12.3 11.9* 13.0    CMP     Component Value Date/Time   NA 134 (L) 04/03/2024 0930   K 4.0 04/03/2024 0930   CL 98 04/03/2024 0930   CO2 23 04/03/2024 0930   GLUCOSE 91 04/03/2024 0930   BUN 8 04/03/2024 0930   CREATININE 0.77 04/03/2024 0930   CALCIUM 9.2 04/03/2024 0930   PROT 6.3 (L) 04/03/2024 0930   ALBUMIN 4.0 04/03/2024 0930   AST 20 04/03/2024 0930   ALT 9 04/03/2024 0930   ALKPHOS 65 04/03/2024 0930   BILITOT 0.4 04/03/2024 0930   GFRNONAA >60 04/03/2024 0930   GFRAA >60 04/06/2020 0827      Latest Ref Rng & Units 04/03/2024    9:30 AM 02/04/2024   10:20 AM 01/14/2024    8:49 AM  Hepatic Function  Total Protein 6.5 - 8.1 g/dL 6.3  6.4  6.2   Albumin 3.5 - 5.0 g/dL 4.0  3.9  3.8   AST 15 - 41 U/L 20  20  19    ALT 0 - 44 U/L 9  14  11    Alk Phosphatase 38 - 126 U/L 65  71  69   Total Bilirubin 0.0 - 1.2 mg/dL 0.4  0.3  0.4       Current Medications:   Current Outpatient Medications (Endocrine & Metabolic):    levothyroxine  (SYNTHROID ) 75 MCG tablet, Take 75  mcg by mouth daily.   Current Outpatient Medications (Cardiovascular):    losartan  (COZAAR ) 25 MG tablet, Take 1 tablet (25 mg total) by mouth daily.   Current Outpatient Medications (Respiratory):    albuterol (VENTOLIN HFA) 108 (90 Base) MCG/ACT inhaler, Inhale 2 puffs into the lungs every 4 (four) hours as needed.   Current Outpatient Medications (Analgesics):    acetaminophen  (TYLENOL ) 500 MG tablet, Take 500 mg by mouth as needed.   HYDROcodone -acetaminophen  (NORCO/VICODIN) 5-325 MG tablet, Take 1 tablet by mouth every 6 (six) hours as needed for moderate pain.     Current Outpatient Medications (Other):    diphenoxylate -atropine  (LOMOTIL ) 2.5-0.025 MG tablet, TAKE ONE TO TWO TABLETS BY MOUTH FOUR TIMES A DAY AS NEEDED FOR  DIARRHEA / LOOSE STOOLS   famotidine  (PEPCID ) 40 MG tablet, Take 1 tablet (40 mg total) by mouth at bedtime.   hyoscyamine  (LEVSIN  SL) 0.125 MG SL tablet, Place 1 tablet (0.125 mg total) under the tongue every 6 (six) hours as needed for cramping (nausea, diarrhea).   lidocaine -prilocaine  (EMLA ) cream, Apply 1 Application topically as needed. Apply to portacath 1-2 hours prior to use   Na Sulfate-K Sulfate-Mg Sulfate concentrate (SUPREP) 17.5-3.13-1.6 GM/177ML SOLN, Take 1 kit (354 mLs total) by mouth once for 1 dose.   prochlorperazine  (COMPAZINE ) 10 MG tablet, Take 10 mg by mouth every 6 (six) hours as needed for nausea or vomiting.   Facility-Administered Medications Ordered in Other Visits (Other):    sodium chloride  flush (NS) 0.9 % injection 10 mL No current facility-administered medications for this visit.  Medical History:  Past Medical History:  Diagnosis Date   Arthritis    GERD (gastroesophageal reflux disease)    History of blood transfusion    Hypothyroidism    Lymphedema    left leg- ted hose makes it worse   Thyroid  disease    Uterine cancer (HCC)    Endometrial cancer - surgery and chemo   Allergies:  Allergies  Allergen Reactions   Oxycodone  Nausea And Vomiting    Other reaction(s): GI Upset,   Tetracycline     Other reaction(s): Unknown   Garlic Diarrhea and Other (See Comments)    Claims must have organic   Other Diarrhea and Other (See Comments)    DAIRY PRODUCTS REDNESS Claims must have organic   Penicillins Rash    Has patient had a PCN reaction causing immediate rash, facial/tongue/throat swelling, SOB or lightheadedness with hypotension: Yes Has patient had a PCN reaction causing severe rash involving mucus membranes or skin necrosis: No Has patient had a PCN reaction that required hospitalization No Has patient had a PCN reaction occurring within the last 10 years: Yes If all of the above answers are NO, then may proceed with Cephalosporin  use.      Surgical History:  She  has a past surgical history that includes Abdominal hysterectomy; Appendectomy; Liver biopsy; mass removed; Eye surgery (Bilateral); Colonoscopy w/ polypectomy; Total hip arthroplasty (Right, 07/13/2016); IR IMAGING GUIDED PORT INSERTION (05/15/2019); IR REMOVAL TUN ACCESS W/ PORT W/O FL MOD SED (06/23/2020); Excision mass lower extremeties (Right, 12/25/2022); and IR IMAGING GUIDED PORT INSERTION (10/29/2023). Family History:  Her family history includes Heart disease in her mother; Multiple myeloma in her mother.  REVIEW OF SYSTEMS  : All other systems reviewed and negative except where noted in the History of Present Illness.  PHYSICAL EXAM: BP 104/72   Pulse 68   Ht 5' 1 (1.549 m)   Wt 170 lb  2 oz (77.2 kg)   BMI 32.14 kg/m  Physical Exam   GENERAL APPEARANCE: Well nourished, in no apparent distress. HEENT: No cervical lymphadenopathy, unremarkable thyroid , sclerae anicteric, conjunctiva pink. RESPIRATORY: Respiratory effort normal, breath sounds equal bilaterally without rales, rhonchi, or wheezing. CARDIO: Regular rate and rhythm with no murmurs, rubs, or gallops, peripheral pulses intact. ABDOMEN: Soft, non-distended, active bowel sounds in all four quadrants, no tenderness to palpation, no rebound, no mass appreciated. RECTAL: Declines. MUSCULOSKELETAL: Full range of motion, normal gait, without edema. SKIN: Dry, intact without rashes or lesions. No jaundice. NEURO: Alert, oriented, no focal deficits. PSYCH: Cooperative, normal mood and affect.      Alan JONELLE Coombs, PA-C 10:05 AM

## 2024-04-10 ENCOUNTER — Other Ambulatory Visit (INDEPENDENT_AMBULATORY_CARE_PROVIDER_SITE_OTHER)

## 2024-04-10 ENCOUNTER — Telehealth: Payer: Self-pay | Admitting: *Deleted

## 2024-04-10 ENCOUNTER — Ambulatory Visit: Admitting: Physician Assistant

## 2024-04-10 ENCOUNTER — Encounter: Payer: Self-pay | Admitting: Physician Assistant

## 2024-04-10 VITALS — BP 104/72 | HR 68 | Ht 61.0 in | Wt 170.1 lb

## 2024-04-10 DIAGNOSIS — R1084 Generalized abdominal pain: Secondary | ICD-10-CM | POA: Diagnosis not present

## 2024-04-10 DIAGNOSIS — R112 Nausea with vomiting, unspecified: Secondary | ICD-10-CM

## 2024-04-10 DIAGNOSIS — R197 Diarrhea, unspecified: Secondary | ICD-10-CM

## 2024-04-10 DIAGNOSIS — C55 Malignant neoplasm of uterus, part unspecified: Secondary | ICD-10-CM

## 2024-04-10 DIAGNOSIS — K219 Gastro-esophageal reflux disease without esophagitis: Secondary | ICD-10-CM | POA: Diagnosis not present

## 2024-04-10 LAB — COMPREHENSIVE METABOLIC PANEL WITH GFR
ALT: 10 U/L (ref 0–35)
AST: 15 U/L (ref 0–37)
Albumin: 4 g/dL (ref 3.5–5.2)
Alkaline Phosphatase: 54 U/L (ref 39–117)
BUN: 8 mg/dL (ref 6–23)
CO2: 28 meq/L (ref 19–32)
Calcium: 9.1 mg/dL (ref 8.4–10.5)
Chloride: 100 meq/L (ref 96–112)
Creatinine, Ser: 0.81 mg/dL (ref 0.40–1.20)
GFR: 72.68 mL/min (ref >60.00)
Glucose, Bld: 94 mg/dL (ref 70–99)
Potassium: 4.5 meq/L (ref 3.5–5.1)
Sodium: 136 meq/L (ref 135–145)
Total Bilirubin: 0.4 mg/dL (ref 0.2–1.2)
Total Protein: 6.5 g/dL (ref 6.0–8.3)

## 2024-04-10 LAB — CBC WITH DIFFERENTIAL/PLATELET
Basophils Absolute: 0 K/uL (ref 0.0–0.1)
Basophils Relative: 0.7 % (ref 0.0–3.0)
Eosinophils Absolute: 0.3 K/uL (ref 0.0–0.7)
Eosinophils Relative: 6.7 % — ABNORMAL HIGH (ref 0.0–5.0)
HCT: 39.2 % (ref 36.0–46.0)
Hemoglobin: 13.3 g/dL (ref 12.0–15.0)
Lymphocytes Relative: 20.1 % (ref 12.0–46.0)
Lymphs Abs: 0.9 K/uL (ref 0.7–4.0)
MCHC: 34 g/dL (ref 30.0–36.0)
MCV: 101 fl — ABNORMAL HIGH (ref 78.0–100.0)
Monocytes Absolute: 0.5 K/uL (ref 0.1–1.0)
Monocytes Relative: 9.6 % (ref 3.0–12.0)
Neutro Abs: 2.9 K/uL (ref 1.4–7.7)
Neutrophils Relative %: 62.9 % (ref 43.0–77.0)
Platelets: 281 K/uL (ref 150.0–400.0)
RBC: 3.88 Mil/uL (ref 3.87–5.11)
RDW: 12.5 % (ref 11.5–15.5)
WBC: 4.7 K/uL (ref 4.0–10.5)

## 2024-04-10 LAB — SEDIMENTATION RATE: Sed Rate: 16 mm/h (ref 0–30)

## 2024-04-10 MED ORDER — FAMOTIDINE 40 MG PO TABS: 40.0000 mg | ORAL_TABLET | Freq: Every day | ORAL | 0 refills | Status: DC

## 2024-04-10 MED ORDER — HYOSCYAMINE SULFATE 0.125 MG SL SUBL: 0.1250 mg | SUBLINGUAL_TABLET | Freq: Four times a day (QID) | SUBLINGUAL | 1 refills | Status: AC | PRN

## 2024-04-10 MED ORDER — NA SULFATE-K SULFATE-MG SULF 17.5-3.13-1.6 GM/177ML PO SOLN: 1.0000 | Freq: Once | ORAL | 0 refills | Status: AC

## 2024-04-10 NOTE — Progress Notes (Signed)
 Done and patient has been notified with plan

## 2024-04-10 NOTE — Addendum Note (Signed)
 Addended by: KATHIE LYLE BRAVO on: 04/10/2024 02:38 PM   Modules accepted: Orders

## 2024-04-10 NOTE — Telephone Encounter (Signed)
 Notified Dana [name] that pharmacy team is checking on any grants available for the Doxil and we will keep her posted. She saw GI today and is having an US  Mesenteric arteries on 10/03 and then endoscoopy/colonoscopy on 10/27. Asking if she should move her chemo out to after 10/27 (currently down for 10/21)?

## 2024-04-10 NOTE — Patient Instructions (Signed)
 Your provider has requested that you go to the basement level for lab work before leaving today. Press B on the elevator. The lab is located at the first door on the left as you exit the elevator.  You have been scheduled for an ultrasound at [facility] (1st floor of hospital) on 04-18-24 at 10am. Please arrive 15 minutes prior to your appointment for registration. Make certain not to have anything to eat or drink midnight prior to your appointment. Should you need to reschedule your appointment, please contact radiology at [phone].   You have been scheduled for an endoscopy and colonoscopy. Please follow the written instructions given to you at your visit today.  If you use inhalers (even only as needed), please bring them with you on the day of your procedure.  DO NOT TAKE 7 DAYS PRIOR TO TEST- Trulicity (dulaglutide) Ozempic, Wegovy (semaglutide) Mounjaro (tirzepatide) Bydureon Bcise (exanatide extended release)  DO NOT TAKE 1 DAY PRIOR TO YOUR TEST Rybelsus (semaglutide) Adlyxin (lixisenatide) Victoza (liraglutide) Byetta (exanatide) ___________________________________________________________________________  Pepcid  40 mg at night Avoid spicy and acidic foods Avoid fatty foods Limit your intake of coffee, tea, alcohol , and carbonated drinks Work to maintain a healthy weight Keep the head of the bed elevated at least 3 inches with blocks or a wedge pillow if you are having any nighttime symptoms Stay upright for 2 hours after eating Avoid meals and snacks three to four hours before bedtime   Gastroparesis Gastroparesis is a condition in which food takes longer than normal to empty from the stomach.  This condition is also known as delayed gastric emptying. It is usually a long-term (chronic) condition.  What are the signs or symptoms? Symptoms of this condition include: Feeling full after eating very little or a loss of appetite. Nausea, vomiting, or  heartburn. Bloating of your abdomen. Inconsistent blood sugar (glucose) levels on blood tests. Unexplained weight loss. Acid from the stomach coming up into the esophagus (gastroesophageal reflux). Sudden tightening (spasm) of the stomach, which can be painful. Symptoms may come and go. Some people may not notice any symptoms.  What increases the risk? You are more likely to develop this condition if: You have certain disorders or diseases. These may include: An endocrine disorder. An eating disorder. Amyloidosis. Scleroderma. Parkinson's disease. Multiple sclerosis. Cancer or infection of the stomach or the vagus nerve. You have had surgery on your stomach or vagus nerve. You take certain medicines. You are female.  Things you can do: Please do small frequent meals like 4-6 meals a day.  Eat and drink liquids at separate times.  Avoid high fiber foods, cook your vegetables, avoid high fat food.  Suggest spreading protein throughout the day (greek yogurt, glucerna, soft meat, milk, eggs) Choose soft foods that you can mash with a fork When you are more symptomatic, change to pureed foods foods and liquids.  Consider reading Living well with Gastroparesis by [facility] Check out this link to a diet online [email or web address]

## 2024-04-10 NOTE — Progress Notes (Signed)
 Agree with assessment and plan as outlined.

## 2024-04-11 ENCOUNTER — Other Ambulatory Visit (HOSPITAL_COMMUNITY): Payer: Self-pay

## 2024-04-11 ENCOUNTER — Telehealth: Payer: Self-pay

## 2024-04-11 ENCOUNTER — Encounter: Payer: Self-pay | Admitting: *Deleted

## 2024-04-11 LAB — C3 AND C4
C3 Complement: 154 mg/dL (ref 83–193)
C4 Complement: 27 mg/dL (ref 15–57)

## 2024-04-11 NOTE — Telephone Encounter (Signed)
 Pharmacy Patient Advocate Encounter   Received notification from CoverMyMeds that prior authorization for Hyoscyamine  Sulfate 0.125MG  Sublingual Tablets is required/requested.   Insurance verification completed.   The patient is insured through [employer] .   Per test claim: Medication is not covered under Part D Law

## 2024-04-14 ENCOUNTER — Ambulatory Visit: Payer: Self-pay | Admitting: Physician Assistant

## 2024-04-14 ENCOUNTER — Other Ambulatory Visit: Payer: Self-pay | Admitting: Nurse Practitioner

## 2024-04-14 DIAGNOSIS — C55 Malignant neoplasm of uterus, part unspecified: Secondary | ICD-10-CM

## 2024-04-14 DIAGNOSIS — Z7189 Other specified counseling: Secondary | ICD-10-CM

## 2024-04-14 LAB — ALPHA-GAL PANEL
Allergen, Mutton, f88: <0.1 kU/L
Allergen, Pork, f26: <0.1 kU/L
Beef: 0.11 kU/L — ABNORMAL HIGH
CLASS: 0
Class: 0
GALACTOSE-ALPHA-1,3-GALACTOSE IGE*: <0.1 kU/L (ref <0.10)

## 2024-04-14 LAB — INTERPRETATION:

## 2024-04-16 ENCOUNTER — Other Ambulatory Visit

## 2024-04-16 ENCOUNTER — Telehealth: Payer: Self-pay | Admitting: Oncology

## 2024-04-16 DIAGNOSIS — R112 Nausea with vomiting, unspecified: Secondary | ICD-10-CM

## 2024-04-16 DIAGNOSIS — R1084 Generalized abdominal pain: Secondary | ICD-10-CM | POA: Diagnosis not present

## 2024-04-16 DIAGNOSIS — R197 Diarrhea, unspecified: Secondary | ICD-10-CM

## 2024-04-16 NOTE — Telephone Encounter (Signed)
 Dana [name] called and said she has some questions about receiving doxil.  She has been working with Dr. Andriette office and was told it would be $9000.00 and there are no grants available.  She has to pay 20% of this after insurance coverage. She is wondering if the $9000.00 is for one treatment or all 6 and does it include the facility chargers.  She is also asking if the cost would be less or if there are assistance programs at [facility]  Advised her that I will check with pharmacy and Dr. Viktoria about [facility] and will call her back.

## 2024-04-16 NOTE — Addendum Note (Signed)
 Addended by: CRAIG PALMA on: 04/16/2024 07:35 AM   Modules accepted: Orders

## 2024-04-18 ENCOUNTER — Encounter (HOSPITAL_COMMUNITY): Payer: Self-pay

## 2024-04-18 ENCOUNTER — Ambulatory Visit (HOSPITAL_COMMUNITY)
Admission: RE | Admit: 2024-04-18 | Discharge: 2024-04-18 | Disposition: A | Discharge status: Home / Self Care | Source: Ambulatory Visit | Attending: Physician Assistant | Admitting: Physician Assistant

## 2024-04-18 ENCOUNTER — Ambulatory Visit (HOSPITAL_COMMUNITY): Admission: RE | Admit: 2024-04-18 | Source: Ambulatory Visit

## 2024-04-18 DIAGNOSIS — R1084 Generalized abdominal pain: Secondary | ICD-10-CM | POA: Insufficient documentation

## 2024-04-18 DIAGNOSIS — R112 Nausea with vomiting, unspecified: Secondary | ICD-10-CM | POA: Insufficient documentation

## 2024-04-18 NOTE — Telephone Encounter (Signed)
 Spoke with Ms. Schimpf and relayed message that Dr.Tucker will look into the cost of Doxil at [facility] and we will update when we have that information. Pt verbalized understanding and thanked the office for calling.

## 2024-04-18 NOTE — Telephone Encounter (Signed)
 Please let the patient know I will look into the cost of Doxil at [facility] and we will update her when I know. Thanks

## 2024-04-19 LAB — FECAL FAT, QUALITATIVE
Fat Qual Neutral, Stl: NORMAL
Fat Qual Total, Stl: NORMAL

## 2024-04-21 ENCOUNTER — Encounter: Payer: Self-pay | Admitting: *Deleted

## 2024-04-21 ENCOUNTER — Telehealth: Payer: Self-pay | Admitting: Gastroenterology

## 2024-04-21 ENCOUNTER — Telehealth: Payer: Self-pay | Admitting: *Deleted

## 2024-04-21 ENCOUNTER — Other Ambulatory Visit: Payer: Self-pay | Admitting: *Deleted

## 2024-04-21 DIAGNOSIS — C55 Malignant neoplasm of uterus, part unspecified: Secondary | ICD-10-CM

## 2024-04-21 LAB — C. DIFFICILE GDH AND TOXIN A/B
GDH ANTIGEN: NOT DETECTED
MICRO NUMBER:: 17042266
SPECIMEN QUALITY:: ADEQUATE
TOXIN A AND B: NOT DETECTED

## 2024-04-21 LAB — PANCREATIC ELASTASE, FECAL: Pancreatic Elastase-1, Stool: >800 ug/g (ref >200)

## 2024-04-21 NOTE — Telephone Encounter (Signed)
 Spoke with Ms. Fikes and relayed message from Dr. Viktoria -I have entered the plan for Doxil at [facility] (which should help prompt the ability to estimate cost to her).  My office recommends that the patient call the oncology financial counselor at [facility]. The number is [phone].  Pt verbalized understanding and asked the office to send her a MyChart message with the above number to the financial counselor at [facility].

## 2024-04-21 NOTE — Telephone Encounter (Signed)
 Dana [name] called back after conversation with her insurance company and said that she will be able to afford #3 cycles of Doxil before 2026. Wants to pursue this and get the ECHO rescheduled. NOtified scheduler for ECHO and it has been rescheduled for 10/13 w/no PA needed. They will reach out to [name] with appointment and location. Dr. Cloretta made aware as well.

## 2024-04-21 NOTE — Telephone Encounter (Signed)
 Inbound call from patient stating insurance advised her that a prior authorization is needed for 10/27 colonoscopy. Please advise, thank you

## 2024-04-21 NOTE — Telephone Encounter (Signed)
 Please let the patient know I have entered the plan for Doxil at [facility] (which should help prompt the ability to estimate cost to her). My office recommends that she call the oncology financial counselor at [facility]. The number is [phone]

## 2024-04-23 ENCOUNTER — Ambulatory Visit (INDEPENDENT_AMBULATORY_CARE_PROVIDER_SITE_OTHER)

## 2024-04-23 DIAGNOSIS — C55 Malignant neoplasm of uterus, part unspecified: Secondary | ICD-10-CM

## 2024-04-23 DIAGNOSIS — Z0189 Encounter for other specified special examinations: Secondary | ICD-10-CM

## 2024-04-23 LAB — ECHOCARDIOGRAM COMPLETE
Area-P 1/2: 3.21 cm2
S' Lateral: 2.59 cm

## 2024-04-23 MED ORDER — PERFLUTREN LIPID MICROSPHERE
1.0000 mL | INTRAVENOUS | Status: AC | PRN
  Administered 2024-04-23: 1 mL via INTRAVENOUS

## 2024-04-24 ENCOUNTER — Other Ambulatory Visit

## 2024-04-24 ENCOUNTER — Ambulatory Visit

## 2024-04-24 ENCOUNTER — Ambulatory Visit: Admitting: Nurse Practitioner

## 2024-04-24 ENCOUNTER — Other Ambulatory Visit: Payer: Self-pay

## 2024-04-24 NOTE — Progress Notes (Signed)
 Pharmacist Chemotherapy Monitoring - Initial Assessment    Anticipated start date: 05/06/24   The following has been reviewed per standard work regarding the patient's treatment regimen: The patient's diagnosis, treatment plan and drug doses, and organ/hematologic function Lab orders and baseline tests specific to treatment regimen  The treatment plan start date, drug sequencing, and pre-medications Prior authorization status  Patient's documented medication list, including drug-drug interaction screen and prescriptions for anti-emetics and supportive care specific to the treatment regimen The drug concentrations, fluid compatibility, administration routes, and timing of the medications to be used The patient's access for treatment and lifetime cumulative dose history, if applicable  The patient's medication allergies and previous infusion related reactions, if applicable   Changes made to treatment plan:  N/A  Follow up needed:  N/A   Dana [name], RPH, 04/24/2024  4:18 PM

## 2024-04-28 NOTE — Telephone Encounter (Signed)
 Attempted tor reach patient to relay message from Dr. Viktoria. Left voicemail requesting call back.

## 2024-04-28 NOTE — Telephone Encounter (Signed)
 Ami - could you please call and let her know I was informed that Doxil treatment was approved at [facility]. I did not get any information about the cost (if any) to her. Has she been able to speak with financial counselor there? She is currently scheduled for treatment there on 10/21.

## 2024-04-28 NOTE — Telephone Encounter (Signed)
 Spoke with Dana [name] and relayed message from Dana [name] that Doxil treatment was approved at [facility]. She was unable to get any information about the cost.  Patient states she hasn't been able to speak with anyone from the financial counselor and has left several messages. Advised patient to call the main number to [facility] and ask for the pharmacy. Pt states Dr. Deanne called the pharmacy at [facility] and was able to get the price of the Doxil at [facility]  Pt has a 20% co-pay. Pt thanked the office for calling.

## 2024-04-29 ENCOUNTER — Ambulatory Visit (HOSPITAL_BASED_OUTPATIENT_CLINIC_OR_DEPARTMENT_OTHER)

## 2024-04-29 ENCOUNTER — Encounter: Payer: Self-pay | Admitting: Gastroenterology

## 2024-04-30 ENCOUNTER — Telehealth: Payer: Self-pay | Admitting: *Deleted

## 2024-04-30 NOTE — Telephone Encounter (Signed)
 Patient returning phone call. Please advise, thank you

## 2024-04-30 NOTE — Telephone Encounter (Signed)
 Dana [name] has left MyChart message and voice mail asking for CTP codes for her drugs on treatment next week as well as a diagnostic code (?). Forwarded this request to managed care. Left her a message that managed care has been requested to research this for her.

## 2024-05-02 ENCOUNTER — Telehealth: Payer: Self-pay

## 2024-05-02 NOTE — Telephone Encounter (Signed)
 The patient contacted us , reporting difficulty reaching the billing department by phone. I provided her with the patient financial resources contact number to facilitate her inquiry regarding billing concerns.

## 2024-05-06 ENCOUNTER — Inpatient Hospital Stay

## 2024-05-06 ENCOUNTER — Inpatient Hospital Stay: Attending: Hematology and Oncology

## 2024-05-06 ENCOUNTER — Other Ambulatory Visit: Payer: Self-pay | Admitting: *Deleted

## 2024-05-06 ENCOUNTER — Encounter: Payer: Self-pay | Admitting: Oncology

## 2024-05-06 ENCOUNTER — Inpatient Hospital Stay: Admitting: Oncology

## 2024-05-06 VITALS — BP 132/87 | HR 71 | Resp 18

## 2024-05-06 VITALS — BP 129/78 | HR 76 | Temp 97.7°F | Resp 18 | Ht 61.0 in | Wt 169.3 lb

## 2024-05-06 DIAGNOSIS — C774 Secondary and unspecified malignant neoplasm of inguinal and lower limb lymph nodes: Secondary | ICD-10-CM | POA: Diagnosis not present

## 2024-05-06 DIAGNOSIS — C541 Malignant neoplasm of endometrium: Secondary | ICD-10-CM | POA: Diagnosis not present

## 2024-05-06 DIAGNOSIS — Z5111 Encounter for antineoplastic chemotherapy: Secondary | ICD-10-CM | POA: Diagnosis not present

## 2024-05-06 DIAGNOSIS — C55 Malignant neoplasm of uterus, part unspecified: Secondary | ICD-10-CM

## 2024-05-06 DIAGNOSIS — Z79632 Long term (current) use of antitumor antibiotic: Secondary | ICD-10-CM | POA: Insufficient documentation

## 2024-05-06 DIAGNOSIS — Z7189 Other specified counseling: Secondary | ICD-10-CM | POA: Diagnosis not present

## 2024-05-06 DIAGNOSIS — C7951 Secondary malignant neoplasm of bone: Secondary | ICD-10-CM | POA: Insufficient documentation

## 2024-05-06 LAB — CBC WITH DIFFERENTIAL (CANCER CENTER ONLY)
Abs Immature Granulocytes: 0.01 K/uL (ref 0.00–0.07)
Basophils Absolute: 0 K/uL (ref 0.0–0.1)
Basophils Relative: 1 %
Eosinophils Absolute: 0.3 K/uL (ref 0.0–0.5)
Eosinophils Relative: 5 %
HCT: 37.2 % (ref 36.0–46.0)
Hemoglobin: 12.7 g/dL (ref 12.0–15.0)
Immature Granulocytes: 0 %
Lymphocytes Relative: 22 %
Lymphs Abs: 1.1 K/uL (ref 0.7–4.0)
MCH: 33.2 pg (ref 26.0–34.0)
MCHC: 34.1 g/dL (ref 30.0–36.0)
MCV: 97.1 fL (ref 80.0–100.0)
Monocytes Absolute: 0.5 K/uL (ref 0.1–1.0)
Monocytes Relative: 10 %
Neutro Abs: 3.2 K/uL (ref 1.7–7.7)
Neutrophils Relative %: 62 %
Platelet Count: 263 K/uL (ref 150–400)
RBC: 3.83 MIL/uL — ABNORMAL LOW (ref 3.87–5.11)
RDW: 11.8 % (ref 11.5–15.5)
WBC Count: 5.1 K/uL (ref 4.0–10.5)
nRBC: 0 % (ref 0.0–0.2)

## 2024-05-06 LAB — CMP (CANCER CENTER ONLY)
ALT: 11 U/L (ref 0–44)
AST: 17 U/L (ref 15–41)
Albumin: 3.9 g/dL (ref 3.5–5.0)
Alkaline Phosphatase: 72 U/L (ref 38–126)
Anion gap: 11 (ref 5–15)
BUN: 7 mg/dL — ABNORMAL LOW (ref 8–23)
CO2: 24 mmol/L (ref 22–32)
Calcium: 9.1 mg/dL (ref 8.9–10.3)
Chloride: 101 mmol/L (ref 98–111)
Creatinine: 0.72 mg/dL (ref 0.44–1.00)
GFR, Estimated: >60 mL/min (ref >60)
Glucose, Bld: 93 mg/dL (ref 70–99)
Potassium: 4.2 mmol/L (ref 3.5–5.1)
Sodium: 136 mmol/L (ref 135–145)
Total Bilirubin: 0.4 mg/dL (ref 0.0–1.2)
Total Protein: 6.3 g/dL — ABNORMAL LOW (ref 6.5–8.1)

## 2024-05-06 MED ORDER — DEXTROSE 5 % IV SOLN: INTRAVENOUS | Status: DC

## 2024-05-06 MED ORDER — DOXORUBICIN HCL LIPOSOMAL CHEMO INJECTION 2 MG/ML
38.0000 mg/m2 | Freq: Once | INTRAVENOUS | Status: AC
  Administered 2024-05-06: 70 mg via INTRAVENOUS
  Filled 2024-05-06: qty 35

## 2024-05-06 MED ORDER — DEXAMETHASONE SOD PHOSPHATE PF 10 MG/ML IJ SOLN
10.0000 mg | Freq: Once | INTRAMUSCULAR | Status: AC
  Administered 2024-05-06: 10 mg via INTRAVENOUS

## 2024-05-06 NOTE — Progress Notes (Signed)
 [facility] OFFICE PROGRESS NOTE   Diagnosis: Uterine cancer  INTERVAL HISTORY:   Dana [name] returns to schedule.  The right groin mass is larger.  The mass bleeds intermittently.  She takes Tylenol  for associated pain.  The episodes of nausea/vomiting and diarrhea since she saw gastroenterology and changed her diet.  She is scheduled for colonoscopy next week. Objective:  Vital signs in last 24 hours:  Blood pressure 129/78, pulse 76, temperature 97.7 F (36.5 C), temperature source Temporal, resp. rate 18, height 5' 1 (1.549 m), weight 169 lb 4.8 oz (76.8 kg), SpO2 100%.  Resp: Lungs clear bilaterally Cardio: Regular rate and rhythm GI: No hepatosplenomegaly, soft, firm fungating mass at the right inguinal region extending superiorly-no bleeding. Vascular: Edema throughout the left leg  Skin: 2 cm cutaneous nodule at the right lateral thigh  Portacath/PICC-without erythema  Lab Results:  Lab Results  Component Value Date   WBC 5.1 05/06/2024   HGB 12.7 05/06/2024   HCT 37.2 05/06/2024   MCV 97.1 05/06/2024   PLT 263 05/06/2024   NEUTROABS 3.2 05/06/2024    CMP  Lab Results  Component Value Date   NA 136 04/10/2024   K 4.5 04/10/2024   CL 100 04/10/2024   CO2 28 04/10/2024   GLUCOSE 94 04/10/2024   BUN 8 04/10/2024   CREATININE 0.81 04/10/2024   CALCIUM 9.1 04/10/2024   PROT 6.5 04/10/2024   ALBUMIN 4.0 04/10/2024   AST 15 04/10/2024   ALT 10 04/10/2024   ALKPHOS 54 04/10/2024   BILITOT 0.4 04/10/2024   GFRNONAA >60 04/03/2024   GFRAA >60 04/06/2020    No results found for: CEA1, CEA, CAN199, CA125  Lab Results  Component Value Date   INR 0.9 05/15/2019   LABPROT 11.6 05/15/2019    Imaging:  No results found.  Medications: I have reviewed the patient's current medications.   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, status post a TAH, BSO, appendectomy, pelvic and  periaortic lymphadenectomy, and wedge resection of a liver lesion Gross complete resection, tumor involve both ovaries, lymph nodes negative 6 cycles of adjuvant Taxol /carboplatin  CT abdomen/pelvis 03/28/2019-enlarged right inguinal and iliac nodes 04/10/2019-right inguinal lymph node biopsy-metastatic adenocarcinoma, ER 90%, PR 95%, PD-L1 combined positive score-1, MSS, tumor mutation burden-4 Cisplatin  05/16/2019 20-1 cycle Radiation to the lateral pelvis and 1 lymph nodes November 2020 CT 12/72/020-decrease size of right inguinal and external iliac lymph nodes, no evidence of disease progression Everolimus  and letrozole  07/14/2019 - 04/06/2020 CT 10/20/2019-resolution of right iliac lymph node, decrease in size of right inguinal node CT 03/08/2020-no evidence of recurrent or metastatic disease in the abdomen or pelvis CT 05/31/2020-no evidence of recurrent or metastatic disease CT 03/11/2021-increase right inguinal lymph node, enlargement of bilateral inguinal lymph nodes, subcutaneous soft tissue nodule at the anterior right thigh Carboplatin  04/06/2021, 04/25/2021, 05/16/2021 CT 07/12/2021-mild decrease in size of bilateral inguinal lymphadenopathy, no progressive disease in the abdomen or pelvis, 0.6 cm left inguinal node was 0.8 cm previously Letrozole  08/06/2021 CT abdomen/pelvis 04/10/2022-stable right inguinal lymph node, no new site of metastatic disease CT right femur  04/10/2022 -2.2 x 1 cm anterior subcutaneous mass Letrozole  continued CT right femur 07/19/2022-mild enlargement of right thigh subcutaneous mass stable right inguinal nodal mass CTs 07/19/2022-stable right inguinal lymph node, no new or progressive disease 11/20/2022-right thigh mass larger and tender, letrozole  discontinued 11/28/2022-significant enlargement of anterior right thigh soft tissue mass, slightly larger right inguinal mass 12/25/2022-wide local excision  of right thigh mass, metastatic endometrial adenocarcinoma, ER  positive, PR positive, Her 2 negative(0),negative resection margins Cycle 1 pembrolizumab /lenvatinib  02/05/2023 Cycle 2 Pembrolizumab  02/27/2023, lenvatinib  placed on hold due to mucositis and hypertension Cycle 3 Pembrolizumab  03/20/2023, lenvatinib  resumed at a dose of 10 mg daily Lenvatinib  discontinued 03/30/2023 due to mucositis, hand-foot symptoms Afinitor /letrozole  04/21/2023 CTs 08/10/2023: Increased nodular tissue anterior to the right hip, new 6 mm right lung nodule CT right femur 08/10/2023: No evidence of recurrent tumor at the right anterior thigh, enlargement of right inguinal soft tissue mass with an additional enlarging mass inferiorly Cycle 1 Carboplatin  11/06/2023 Cycle 2 carboplatin  11/26/2023, dose reduction to AUC 4 due to f mild thrombocytopenia, home Decadron  prophylaxis Treatment held due to neutropenia 12/18/2023 Cycle 3 carboplatin  12/25/2023, dose reduction due to neutropenia following cycle 2, patient declines white cell growth factor support Cycle 4 carboplatin  01/14/2024 CT right femur 02/03/2024-enlarging right inguinal mass CTs 02/03/2024: Previous right lower lobe nodule now measures 0.2 x 0.3 cm-favor benign versus treatment response, increased masslike thickening in the right groin 03/25/24: [employer]: FOLR1 negative (5%) 04/23/2024 echocardiogram: LVEF 60-65% Cycle 1 Doxil 05/06/2024   2.  Left lower extremity lymphedema, improved after therapy in the lymphedema clinic   3.  Chronic diarrhea controlled with Lomotil    Disposition: Dana [name] appears stable.  She will complete cycle 1 of Doxil today.  We again reviewed potential toxicities associated with Doxil including the chance of nausea, alopecia, hematologic toxicity, infection, bleeding, rash, cardiac toxicity, and hand/foot syndrome.  She agrees to proceed.  She will return for an office visit and Doxil in 4 weeks.  She will follow-up with gastroenterology for further evaluation of the GI symptoms.  Arley Hof,  MD  05/06/2024  11:10 AM

## 2024-05-06 NOTE — Patient Instructions (Signed)
 [facility] - A DEPT OF [facility]  Discharge Instructions: Thank you for choosing [facility] to provide your oncology and hematology care.   If you have a lab appointment with the Cancer Center, please go directly to the Cancer Center and check in at the registration area.   Wear comfortable clothing and clothing appropriate for easy access to any Portacath or PICC line.   We strive to give you quality time with your provider. You may need to reschedule your appointment if you arrive late (15 or more minutes).  Arriving late affects you and other patients whose appointments are after yours.  Also, if you miss three or more appointments without notifying the office, you may be dismissed from the clinic at the provider's discretion.      For prescription refill requests, have your pharmacy contact our office and allow 72 hours for refills to be completed.    Today you received the following chemotherapy and/or immunotherapy agents: doxorubicin-liposomal      To help prevent nausea and vomiting after your treatment, we encourage you to take your nausea medication as directed.  BELOW ARE SYMPTOMS THAT SHOULD BE REPORTED IMMEDIATELY: *FEVER GREATER THAN 100.4 F (38 C) OR HIGHER *CHILLS OR SWEATING *NAUSEA AND VOMITING THAT IS NOT CONTROLLED WITH YOUR NAUSEA MEDICATION *UNUSUAL SHORTNESS OF BREATH *UNUSUAL BRUISING OR BLEEDING *URINARY PROBLEMS (pain or burning when urinating, or frequent urination) *BOWEL PROBLEMS (unusual diarrhea, constipation, pain near the anus) TENDERNESS IN MOUTH AND THROAT WITH OR WITHOUT PRESENCE OF ULCERS (sore throat, sores in mouth, or a toothache) UNUSUAL RASH, SWELLING OR PAIN  UNUSUAL VAGINAL DISCHARGE OR ITCHING   Items with * indicate a potential emergency and should be followed up as soon as possible or go to the Emergency Department if any problems should occur.  Please show the CHEMOTHERAPY ALERT CARD or  IMMUNOTHERAPY ALERT CARD at check-in to the Emergency Department and triage nurse.  Should you have questions after your visit or need to cancel or reschedule your appointment, please contact [facility] - A DEPT OF [facility]  Dept: [phone]  and follow the prompts.  Office hours are 8:00 a.m. to 4:30 p.m. Monday - Friday. Please note that voicemails left after 4:00 p.m. may not be returned until the following business day.  We are closed weekends and major holidays. You have access to a nurse at all times for urgent questions. Please call the main number to the clinic Dept: [phone] and follow the prompts.   For any non-urgent questions, you may also contact your provider using MyChart. We now offer e-Visits for anyone 78 and older to request care online for non-urgent symptoms. For details visit [email or web address].   Also download the MyChart app! Go to the app store, search MyChart, open the app, select , and log in with your MyChart username and password.

## 2024-05-06 NOTE — Progress Notes (Signed)
 Patient seen by Dr. Arley Hof today  Vitals are within treatment parameters:Yes   Labs are within treatment parameters: Yes   Treatment plan has been signed: Yes   Per physician team, Patient is ready for treatment and there are NO modifications to the treatment plan. MD has requested dose reduction to 70 mg on Doxil.

## 2024-05-07 ENCOUNTER — Telehealth: Payer: Self-pay | Admitting: Oncology

## 2024-05-12 ENCOUNTER — Encounter: Payer: Self-pay | Admitting: Gastroenterology

## 2024-05-12 ENCOUNTER — Ambulatory Visit (AMBULATORY_SURGERY_CENTER): Admitting: Gastroenterology

## 2024-05-12 VITALS — BP 120/74 | HR 71 | Temp 97.3°F | Resp 16 | Ht 61.0 in | Wt 170.0 lb

## 2024-05-12 DIAGNOSIS — K317 Polyp of stomach and duodenum: Secondary | ICD-10-CM | POA: Diagnosis not present

## 2024-05-12 DIAGNOSIS — K5730 Diverticulosis of large intestine without perforation or abscess without bleeding: Secondary | ICD-10-CM

## 2024-05-12 DIAGNOSIS — D122 Benign neoplasm of ascending colon: Secondary | ICD-10-CM | POA: Diagnosis not present

## 2024-05-12 DIAGNOSIS — K6289 Other specified diseases of anus and rectum: Secondary | ICD-10-CM | POA: Diagnosis not present

## 2024-05-12 DIAGNOSIS — R197 Diarrhea, unspecified: Secondary | ICD-10-CM

## 2024-05-12 DIAGNOSIS — R112 Nausea with vomiting, unspecified: Secondary | ICD-10-CM | POA: Diagnosis not present

## 2024-05-12 DIAGNOSIS — K5710 Diverticulosis of small intestine without perforation or abscess without bleeding: Secondary | ICD-10-CM

## 2024-05-12 DIAGNOSIS — K3189 Other diseases of stomach and duodenum: Secondary | ICD-10-CM | POA: Diagnosis not present

## 2024-05-12 DIAGNOSIS — K648 Other hemorrhoids: Secondary | ICD-10-CM

## 2024-05-12 DIAGNOSIS — G709 Myoneural disorder, unspecified: Secondary | ICD-10-CM | POA: Insufficient documentation

## 2024-05-12 DIAGNOSIS — K449 Diaphragmatic hernia without obstruction or gangrene: Secondary | ICD-10-CM

## 2024-05-12 DIAGNOSIS — K529 Noninfective gastroenteritis and colitis, unspecified: Secondary | ICD-10-CM | POA: Diagnosis not present

## 2024-05-12 DIAGNOSIS — K2270 Barrett's esophagus without dysplasia: Secondary | ICD-10-CM

## 2024-05-12 DIAGNOSIS — K644 Residual hemorrhoidal skin tags: Secondary | ICD-10-CM

## 2024-05-12 DIAGNOSIS — K2950 Unspecified chronic gastritis without bleeding: Secondary | ICD-10-CM

## 2024-05-12 DIAGNOSIS — E039 Hypothyroidism, unspecified: Secondary | ICD-10-CM | POA: Diagnosis not present

## 2024-05-12 MED ORDER — SODIUM CHLORIDE 0.9 % IV SOLN: 500.0000 mL | INTRAVENOUS | Status: DC

## 2024-05-12 MED ORDER — OMEPRAZOLE 40 MG PO CPDR: 40.0000 mg | DELAYED_RELEASE_CAPSULE | Freq: Every day | ORAL | 3 refills | Status: DC

## 2024-05-12 NOTE — Op Note (Signed)
 [facility] Patient Name: Dana [name] Procedure Date: 05/12/2024 1:52 PM MRN: [id number] Endoscopist: Elspeth P. Leigh , MD, [id number] Age: 73 Referring MD:  Date of Birth: 1951-01-26 Gender: Female Account #: [id number] Procedure:                Upper GI endoscopy Indications:              Nausea with vomiting Medicines:                Monitored Anesthesia Care Procedure:                Pre-Anesthesia Assessment:                           - Prior to the procedure, a History and Physical                            was performed, and patient medications and                            allergies were reviewed. The patient's tolerance of                            previous anesthesia was also reviewed. The risks                            and benefits of the procedure and the sedation                            options and risks were discussed with the patient.                            All questions were answered, and informed consent                            was obtained. Prior Anticoagulants: The patient has                            taken no anticoagulant or antiplatelet agents. ASA                            Grade Assessment: III - A patient with severe                            systemic disease. After reviewing the risks and                            benefits, the patient was deemed in satisfactory                            condition to undergo the procedure.                           After obtaining informed consent, the endoscope was  passed under direct vision. Throughout the                            procedure, the patient's blood pressure, pulse, and                            oxygen saturations were monitored continuously. The                            GIF [id number] #[id number] was introduced through the                            mouth, and advanced to the second part of duodenum.                            The upper GI endoscopy  was accomplished without                            difficulty. The patient tolerated the procedure                            well. Scope In: Scope Out: Findings:                 Esophagogastric landmarks were identified: the                            Z-line was found at 19 cm, the gastroesophageal                            junction was found at 30 cm and the upper extent of                            the gastric folds was found at 37 cm from the                            incisors.                           A 7 cm hiatal hernia was present.                           There were esophageal mucosal changes classified as                            Barrett's stage C11-M11 per Prague criteria present                            in the middle third of the esophagus and in the                            lower third of the esophagus. The maximum  longitudinal extent of these mucosal changes was 11                            cm in length. Mucosa was biopsied with a cold                            forceps for histology (4 biopsies every 2 cms).                           The exam of the esophagus was otherwise normal.                           Multiple medium sessile polyps were found in the                            cardia, in the gastric fundus and in the gastric                            body. Grossly consistent with benign fundic gland                            polyps. Biopsies were taken with a cold forceps for                            histology.                           The exam of the stomach was otherwise normal. Of                            note, due to the size of the hernia, there was poor                            air insufflation of the stomach, retroflexed views                            were limited.                           Biopsies were taken with a cold forceps for                            Helicobacter pylori testing.                            A diverticulum was found in the second portion of                            the duodenum.                           The exam of the duodenum was otherwise normal.  Biopsies for histology were taken with a cold                            forceps for evaluation of celiac disease. Complications:            No immediate complications. Estimated blood loss:                            Minimal. Estimated Blood Loss:     Estimated blood loss was minimal. Impression:               - Esophagogastric landmarks identified.                           - 7 cm hiatal hernia.                           - Esophageal mucosal changes classified as                            Barrett's stage C11-M11 per Prague criteria.                            Biopsied.                           - Normal esophagus otherwise.                           - Multiple gastric polyps consistent with benign                            fundic gland polyps. Biopsied.                           - Normal stomach otherwise - biopsies taken to rule                            out H pylori                           - Duodenal diverticulum.                           - Normal duodenum otherwise. Biopsies were taken                            with a cold forceps for evaluation of celiac                            disease. Recommendation:           - Patient has a contact number available for                            emergencies. The signs and symptoms of potential  delayed complications were discussed with the                            patient. Return to normal activities tomorrow.                            Written discharge instructions were provided to the                            patient.                           - Resume previous diet.                           - Continue present medications.                           - Start omeprazole 40mg  / day if not already taking                             PPI (I see pepcid  only)                           - Await pathology results. Elspeth P. Annamaria Salah, MD 05/12/2024 2:44:19 PM This report has been signed electronically.

## 2024-05-12 NOTE — Patient Instructions (Signed)
 Please read handouts provided. Continue present medications. Await pathology results. Start Omeprazole 40 mg daily. Resume previous diet.   YOU HAD AN ENDOSCOPIC PROCEDURE TODAY AT THE [facility]:   Refer to the procedure report that was given to you for any specific questions about what was found during the examination.  If the procedure report does not answer your questions, please call your gastroenterologist to clarify.  If you requested that your care partner not be given the details of your procedure findings, then the procedure report has been included in a sealed envelope for you to review at your convenience later.  YOU SHOULD EXPECT: Some feelings of bloating in the abdomen. Passage of more gas than usual.  Walking can help get rid of the air that was put into your GI tract during the procedure and reduce the bloating. If you had a lower endoscopy (such as a colonoscopy or flexible sigmoidoscopy) you may notice spotting of blood in your stool or on the toilet paper. If you underwent a bowel prep for your procedure, you may not have a normal bowel movement for a few days.  Please Note:  You might notice some irritation and congestion in your nose or some drainage.  This is from the oxygen used during your procedure.  There is no need for concern and it should clear up in a day or so.  SYMPTOMS TO REPORT IMMEDIATELY:  Following lower endoscopy (colonoscopy or flexible sigmoidoscopy):  Excessive amounts of blood in the stool  Significant tenderness or worsening of abdominal pains  Swelling of the abdomen that is new, acute  Fever of 100F or higher  Following upper endoscopy (EGD)  Vomiting of blood or coffee ground material  New chest pain or pain under the shoulder blades  Painful or persistently difficult swallowing  New shortness of breath  Fever of 100F or higher  Black, tarry-looking stools  For urgent or emergent issues, a gastroenterologist can be reached at  any hour by calling [phone]. Do not use MyChart messaging for urgent concerns.    DIET:  We do recommend a small meal at first, but then you may proceed to your regular diet.  Drink plenty of fluids but you should avoid alcoholic beverages for 24 hours.  ACTIVITY:  You should plan to take it easy for the rest of today and you should NOT DRIVE or use heavy machinery until tomorrow (because of the sedation medicines used during the test).    FOLLOW UP: Our staff will call the number listed on your records the next business day following your procedure.  We will call around 7:15- 8:00 am to check on you and address any questions or concerns that you may have regarding the information given to you following your procedure. If we do not reach you, we will leave a message.     If any biopsies were taken you will be contacted by phone or by letter within the next 1-3 weeks.  Please call us  at [phone] if you have not heard about the biopsies in 3 weeks.    SIGNATURES/CONFIDENTIALITY: You and/or your care partner have signed paperwork which will be entered into your electronic medical record.  These signatures attest to the fact that that the information above on your After Visit Summary has been reviewed and is understood.  Full responsibility of the confidentiality of this discharge information lies with you and/or your care-partner.

## 2024-05-12 NOTE — Progress Notes (Signed)
 Called to room to assist during endoscopic procedure.  Patient ID and intended procedure confirmed with present staff. Received instructions for my participation in the procedure from the performing physician.

## 2024-05-12 NOTE — Op Note (Signed)
  [facility] Patient Name: Dana [name] Procedure Date: 05/12/2024 1:51 PM MRN: [id number] Endoscopist: Elspeth P. Leigh , MD, [id number] Age: 73 Referring MD:  Date of Birth: Mar 03, 1951 Gender: Female Account #: [id number] Procedure:                Colonoscopy Indications:              Chronic diarrhea - history of metastatic                            endometrial cancer with history of radiation Medicines:                Monitored Anesthesia Care Procedure:                Pre-Anesthesia Assessment:                           - Prior to the procedure, a History and Physical                            was performed, and patient medications and                            allergies were reviewed. The patient's tolerance of                            previous anesthesia was also reviewed. The risks                            and benefits of the procedure and the sedation                            options and risks were discussed with the patient.                            All questions were answered, and informed consent                            was obtained. Prior Anticoagulants: The patient has                            taken no anticoagulant or antiplatelet agents. ASA                            Grade Assessment: III - A patient with severe                            systemic disease. After reviewing the risks and                            benefits, the patient was deemed in satisfactory                            condition to undergo the procedure.  After obtaining informed consent, the colonoscope                            was passed under direct vision. Throughout the                            procedure, the patient's blood pressure, pulse, and                            oxygen saturations were monitored continuously. The                            Olympus Scope SN [id number] was introduced through the                            anus and  advanced to the the terminal ileum, with                            identification of the appendiceal orifice and IC                            valve. The colonoscopy was performed without                            difficulty. The patient tolerated the procedure                            well. The quality of the bowel preparation was                            good. The terminal ileum, ileocecal valve,                            appendiceal orifice, and rectum were photographed. Scope In: 2:19:22 PM Scope Out: 2:33:38 PM Scope Withdrawal Time: 0 hours 11 minutes 7 seconds  Total Procedure Duration: 0 hours 14 minutes 16 seconds  Findings:                 Skin tags were found on perianal exam.                           The terminal ileum appeared normal.                           A 3 to 4 mm polyp was found in the ascending colon.                            The polyp was sessile. The polyp was removed with a                            cold snare. Resection and retrieval were complete.                           Multiple diverticula  were found in the transverse                            colon and left colon.                           Anal papilla(e) were hypertrophied.                           Internal hemorrhoids were found during                            retroflexion. The hemorrhoids were small.                           The exam was otherwise without abnormality.                           Biopsies for histology were taken with a cold                            forceps for evaluation of microscopic colitis. Complications:            No immediate complications. Estimated blood loss:                            Minimal. Estimated Blood Loss:     Estimated blood loss was minimal. Impression:               - Perianal skin tags found on perianal exam.                           - The examined portion of the ileum was normal.                           - One 3 to 4 mm polyp in the ascending  colon,                            removed with a cold snare. Resected and retrieved.                           - Diverticulosis in the transverse colon and in the                            left colon.                           - Anal papilla(e) were hypertrophied.                           - Internal hemorrhoids.                           - The examination was otherwise normal.                           -  Biopsies were taken with a cold forceps for                            evaluation of microscopic colitis. Recommendation:           - Patient has a contact number available for                            emergencies. The signs and symptoms of potential                            delayed complications were discussed with the                            patient. Return to normal activities tomorrow.                            Written discharge instructions were provided to the                            patient.                           - Resume previous diet.                           - Continue present medications.                           - Await pathology results with further                            recommendations. Elspeth P. Corena Tilson, MD 05/12/2024 2:40:13 PM This report has been signed electronically.

## 2024-05-12 NOTE — Progress Notes (Signed)
  Gastroenterology History and Physical   Primary Care Physician:  Cloretta Arley NOVAK, MD   Reason for Procedure:   Nausea / vomiting, diarrhea  Plan:    EGD and colonoscopy     HPI: Dana [name] is a 73 y.o. female  here for EGD and colonoscopy - see note 9/25 for details. History of metastatic uterine cancer s/p XRT and now just started on chemotherapy. Stool testing negative. Symptoms ongoing for some time, intermittent but quite bothersome. Has not had a colonoscopy in many years. Feels well today, labs last week  normal.   Otherwise feels well without any cardiopulmonary symptoms.   I have discussed risks / benefits of anesthesia and endoscopic procedure with Katha J Wickliffe and they wish to proceed with the exams as outlined today.    Past Medical History:  Diagnosis Date   Arthritis    Asthma    GERD (gastroesophageal reflux disease)    History of blood transfusion    Hypothyroidism    Lymphedema    left leg- ted hose makes it worse   Neuromuscular disorder (HCC)    neuropathy in feet   Thyroid  disease    Uterine cancer (HCC)    Endometrial cancer - surgery and chemo    Past Surgical History:  Procedure Laterality Date   ABDOMINAL HYSTERECTOMY     APPENDECTOMY     COLONOSCOPY W/ POLYPECTOMY     EXCISION MASS LOWER EXTREMETIES Right 12/25/2022   Procedure: EXCISION OF RIGHT THIGH MASS, MALIGNANT AND ADVANCEMENT FLAP CLOSURE;  Surgeon: Aron Shoulders, MD;  Location: [facility];  Service: General;  Laterality: Right;   EYE SURGERY Bilateral    cataracts   IR IMAGING GUIDED PORT INSERTION  05/15/2019   IR IMAGING GUIDED PORT INSERTION  10/29/2023   IR REMOVAL TUN ACCESS W/ PORT W/O FL MOD SED  06/23/2020   LIVER BIOPSY     mass removed     2015 from pelvis   TOTAL HIP ARTHROPLASTY Right 07/13/2016   Procedure: RIGHT TOTAL HIP ARTHROPLASTY ANTERIOR APPROACH;  Surgeon: Kay CHRISTELLA Cummins, MD;  Location: [facility] OR;  Service: Orthopedics;  Laterality: Right;     Prior to Admission medications   Medication Sig Start Date End Date Taking? Authorizing Provider  albuterol (VENTOLIN HFA) 108 (90 Base) MCG/ACT inhaler Inhale 2 puffs into the lungs every 4 (four) hours as needed. 07/30/23  Yes [provider]  levothyroxine  (SYNTHROID ) 88 MCG tablet Take 88 mcg by mouth daily. 04/14/24  Yes [provider]  lidocaine -prilocaine  (EMLA ) cream Apply 1 Application topically as needed. Apply to portacath 1-2 hours prior to use 11/06/23  Yes Debby Olam POUR, NP  losartan  (COZAAR ) 25 MG tablet Take 1 tablet (25 mg total) by mouth daily. 11/30/23  Yes Cloretta Arley NOVAK, MD  prochlorperazine  (COMPAZINE ) 10 MG tablet Take 10 mg by mouth every 6 (six) hours as needed for nausea or vomiting.   Yes [provider]  acetaminophen  (TYLENOL ) 500 MG tablet Take 500 mg by mouth as needed.    [provider]  diphenoxylate -atropine  (LOMOTIL ) 2.5-0.025 MG tablet TAKE ONE TO TWO TABLETS BY MOUTH FOUR TIMES A DAY AS NEEDED FOR DIARRHEA / LOOSE STOOLS 01/25/23   Cloretta Arley NOVAK, MD  famotidine  (PEPCID ) 40 MG tablet Take 1 tablet (40 mg total) by mouth at bedtime. Patient not taking: No sig reported 04/10/24   Craig Alan SAUNDERS, PA-C  HYDROcodone -acetaminophen  (NORCO/VICODIN) 5-325 MG tablet Take 1 tablet by mouth every  6 (six) hours as needed for moderate pain. Patient not taking: No sig reported 12/25/22   Aron Shoulders, MD  hyoscyamine  (LEVSIN  SL) 0.125 MG SL tablet Place 1 tablet (0.125 mg total) under the tongue every 6 (six) hours as needed for cramping (nausea, diarrhea). Patient not taking: No sig reported 04/10/24   Craig Alan SAUNDERS, PA-C    Current Outpatient Medications  Medication Sig Dispense Refill   albuterol (VENTOLIN HFA) 108 (90 Base) MCG/ACT inhaler Inhale 2 puffs into the lungs every 4 (four) hours as needed.     levothyroxine  (SYNTHROID ) 88 MCG tablet Take 88 mcg by mouth daily.     lidocaine -prilocaine  (EMLA ) cream Apply 1  Application topically as needed. Apply to portacath 1-2 hours prior to use 30 g 3   losartan  (COZAAR ) 25 MG tablet Take 1 tablet (25 mg total) by mouth daily. 90 tablet 1   prochlorperazine  (COMPAZINE ) 10 MG tablet Take 10 mg by mouth every 6 (six) hours as needed for nausea or vomiting.     acetaminophen  (TYLENOL ) 500 MG tablet Take 500 mg by mouth as needed.     diphenoxylate -atropine  (LOMOTIL ) 2.5-0.025 MG tablet TAKE ONE TO TWO TABLETS BY MOUTH FOUR TIMES A DAY AS NEEDED FOR DIARRHEA / LOOSE STOOLS 90 tablet 0   famotidine  (PEPCID ) 40 MG tablet Take 1 tablet (40 mg total) by mouth at bedtime. (Patient not taking: No sig reported) 90 tablet 0   HYDROcodone -acetaminophen  (NORCO/VICODIN) 5-325 MG tablet Take 1 tablet by mouth every 6 (six) hours as needed for moderate pain. (Patient not taking: No sig reported) 15 tablet 0   hyoscyamine  (LEVSIN  SL) 0.125 MG SL tablet Place 1 tablet (0.125 mg total) under the tongue every 6 (six) hours as needed for cramping (nausea, diarrhea). (Patient not taking: No sig reported) 50 tablet 1   Current Facility-Administered Medications  Medication Dose Route Frequency Provider Last Rate Last Admin   0.9 %  sodium chloride  infusion  500 mL Intravenous Continuous Rosealyn Little, Elspeth SQUIBB, MD       Facility-Administered Medications Ordered in Other Visits  Medication Dose Route Frequency Provider Last Rate Last Admin   sodium chloride  flush (NS) 0.9 % injection 10 mL  10 mL Intravenous PRN Thomas, Lisa K, NP   10 mL at 12/18/23 0925    Allergies as of 05/12/2024 - Review Complete 05/12/2024  Allergen Reaction Noted   Tetracycline Nausea And Vomiting 10/14/2014   Garlic Diarrhea and Other (See Comments) 07/12/2016   Other Diarrhea and Other (See Comments) 07/03/2016   Oxycodone  Nausea And Vomiting 04/07/2019   Penicillins Rash 06/28/2016    Family History  Problem Relation Age of Onset   Heart disease Mother    Multiple myeloma Mother    Breast cancer Neg Hx     Ovarian cancer Neg Hx    Pancreatic cancer Neg Hx    Colon cancer Neg Hx    Endometrial cancer Neg Hx    Prostate cancer Neg Hx    Esophageal cancer Neg Hx    Rectal cancer Neg Hx    Stomach cancer Neg Hx     Social History   Socioeconomic History   Marital status: Single    Spouse name: Not on file   Number of children: Not on file   Years of education: Not on file   Highest education level: Not on file  Occupational History   Occupation: food social worker  Tobacco Use   Smoking status: Never   Smokeless tobacco:  Never  Vaping Use   Vaping status: Never Used  Substance and Sexual Activity   Alcohol  use: No    Alcohol /week: 0.0 standard drinks of alcohol    Drug use: No   Sexual activity: Not Currently  Other Topics Concern   Not on file  Social History Narrative   Not on file   Social Drivers of Health   Financial Resource Strain: Not on file  Food Insecurity: Not on file  Transportation Needs: Not on file  Physical Activity: Not on file  Stress: Not on file  Social Connections: Not on file  Intimate Partner Violence: Not on file    Review of Systems: All other review of systems negative except as mentioned in the HPI.  Physical Exam: Vital signs BP 127/78   Pulse (!) 102   Temp (!) 97.3 F (36.3 C) (Temporal)   Ht 5' 1 (1.549 m)   Wt 170 lb (77.1 kg)   SpO2 97%   BMI 32.12 kg/m   General:   Alert,  Well-developed, pleasant and cooperative in NAD Lungs:  Clear throughout to auscultation.   Heart:  Regular rate and rhythm Abdomen:  Soft, nontender and nondistended.   Neuro/Psych:  Alert and cooperative. Normal mood and affect. A and O x 3  Marcey Naval, MD [facility]

## 2024-05-12 NOTE — Progress Notes (Signed)
 Report to PACU, RN, vss, BBS= Clear.

## 2024-05-13 ENCOUNTER — Telehealth: Payer: Self-pay

## 2024-05-13 NOTE — Telephone Encounter (Signed)
  Follow up Call-     05/12/2024   12:50 PM  Call back number  Post procedure Call Back phone  # [phone]  Permission to leave phone message Yes     Patient questions:  Do you have a fever, pain , or abdominal swelling? No. Pain Score  0 *  Have you tolerated food without any problems? Yes.    Have you been able to return to your normal activities? Yes.    Do you have any questions about your discharge instructions: Diet   No. Medications  No. Follow up visit  No.  Do you have questions or concerns about your Care? No.  Actions: * If pain score is 4 or above: No action needed, pain <4.

## 2024-05-16 ENCOUNTER — Ambulatory Visit: Payer: Self-pay | Admitting: Gastroenterology

## 2024-05-16 LAB — SURGICAL PATHOLOGY

## 2024-05-22 ENCOUNTER — Inpatient Hospital Stay

## 2024-05-22 ENCOUNTER — Inpatient Hospital Stay: Admitting: Nurse Practitioner

## 2024-05-25 ENCOUNTER — Encounter: Payer: Self-pay | Admitting: Gynecologic Oncology

## 2024-05-25 ENCOUNTER — Other Ambulatory Visit: Payer: Self-pay

## 2024-05-27 ENCOUNTER — Other Ambulatory Visit: Payer: Self-pay | Admitting: Oncology

## 2024-05-27 DIAGNOSIS — C55 Malignant neoplasm of uterus, part unspecified: Secondary | ICD-10-CM

## 2024-06-02 ENCOUNTER — Encounter: Payer: Self-pay | Admitting: *Deleted

## 2024-06-02 ENCOUNTER — Encounter: Payer: Self-pay | Admitting: Oncology

## 2024-06-03 ENCOUNTER — Inpatient Hospital Stay

## 2024-06-03 ENCOUNTER — Inpatient Hospital Stay: Attending: Hematology and Oncology

## 2024-06-03 ENCOUNTER — Inpatient Hospital Stay (HOSPITAL_BASED_OUTPATIENT_CLINIC_OR_DEPARTMENT_OTHER): Admitting: Oncology

## 2024-06-03 VITALS — BP 155/87 | HR 72 | Temp 98.0°F | Resp 18

## 2024-06-03 VITALS — BP 130/84 | HR 85 | Temp 97.8°F | Resp 18 | Ht 61.0 in | Wt 169.3 lb

## 2024-06-03 DIAGNOSIS — Z79632 Long term (current) use of antitumor antibiotic: Secondary | ICD-10-CM | POA: Diagnosis not present

## 2024-06-03 DIAGNOSIS — Z7189 Other specified counseling: Secondary | ICD-10-CM

## 2024-06-03 DIAGNOSIS — C774 Secondary and unspecified malignant neoplasm of inguinal and lower limb lymph nodes: Secondary | ICD-10-CM | POA: Diagnosis not present

## 2024-06-03 DIAGNOSIS — C7951 Secondary malignant neoplasm of bone: Secondary | ICD-10-CM | POA: Diagnosis not present

## 2024-06-03 DIAGNOSIS — Z5111 Encounter for antineoplastic chemotherapy: Secondary | ICD-10-CM | POA: Insufficient documentation

## 2024-06-03 DIAGNOSIS — C55 Malignant neoplasm of uterus, part unspecified: Secondary | ICD-10-CM

## 2024-06-03 DIAGNOSIS — C541 Malignant neoplasm of endometrium: Secondary | ICD-10-CM | POA: Diagnosis not present

## 2024-06-03 LAB — CBC WITH DIFFERENTIAL (CANCER CENTER ONLY)
Abs Immature Granulocytes: 0.01 K/uL (ref 0.00–0.07)
Basophils Absolute: 0 K/uL (ref 0.0–0.1)
Basophils Relative: 1 %
Eosinophils Absolute: 0.1 K/uL (ref 0.0–0.5)
Eosinophils Relative: 2 %
HCT: 36.5 % (ref 36.0–46.0)
Hemoglobin: 12.4 g/dL (ref 12.0–15.0)
Immature Granulocytes: 0 %
Lymphocytes Relative: 28 %
Lymphs Abs: 0.9 K/uL (ref 0.7–4.0)
MCH: 32.5 pg (ref 26.0–34.0)
MCHC: 34 g/dL (ref 30.0–36.0)
MCV: 95.8 fL (ref 80.0–100.0)
Monocytes Absolute: 0.5 K/uL (ref 0.1–1.0)
Monocytes Relative: 16 %
Neutro Abs: 1.7 K/uL (ref 1.7–7.7)
Neutrophils Relative %: 53 %
Platelet Count: 288 K/uL (ref 150–400)
RBC: 3.81 MIL/uL — ABNORMAL LOW (ref 3.87–5.11)
RDW: 13.1 % (ref 11.5–15.5)
WBC Count: 3.3 K/uL — ABNORMAL LOW (ref 4.0–10.5)
nRBC: 0 % (ref 0.0–0.2)

## 2024-06-03 LAB — CMP (CANCER CENTER ONLY)
ALT: 12 U/L (ref 0–44)
AST: 20 U/L (ref 15–41)
Albumin: 3.7 g/dL (ref 3.5–5.0)
Alkaline Phosphatase: 68 U/L (ref 38–126)
Anion gap: 9 (ref 5–15)
BUN: 9 mg/dL (ref 8–23)
CO2: 25 mmol/L (ref 22–32)
Calcium: 9.1 mg/dL (ref 8.9–10.3)
Chloride: 103 mmol/L (ref 98–111)
Creatinine: 0.69 mg/dL (ref 0.44–1.00)
GFR, Estimated: >60 mL/min (ref >60)
Glucose, Bld: 93 mg/dL (ref 70–99)
Potassium: 4.2 mmol/L (ref 3.5–5.1)
Sodium: 137 mmol/L (ref 135–145)
Total Bilirubin: 0.4 mg/dL (ref 0.0–1.2)
Total Protein: 6.2 g/dL — ABNORMAL LOW (ref 6.5–8.1)

## 2024-06-03 MED ORDER — DOXORUBICIN HCL LIPOSOMAL CHEMO INJECTION 2 MG/ML
38.0000 mg/m2 | Freq: Once | INTRAVENOUS | Status: AC
  Administered 2024-06-03: 70 mg via INTRAVENOUS
  Filled 2024-06-03: qty 25

## 2024-06-03 MED ORDER — DEXTROSE 5 % IV SOLN: INTRAVENOUS | Status: DC

## 2024-06-03 MED ORDER — DEXAMETHASONE SOD PHOSPHATE PF 10 MG/ML IJ SOLN
10.0000 mg | Freq: Once | INTRAMUSCULAR | Status: AC
  Administered 2024-06-03: 10 mg via INTRAVENOUS

## 2024-06-03 NOTE — Patient Instructions (Signed)
 [facility] - A DEPT OF . [facility]  Discharge Instructions: Thank you for choosing [facility] to provide your oncology and hematology care.   If you have a lab appointment with the Cancer Center, please go directly to the Cancer Center and check in at the registration area.   Wear comfortable clothing and clothing appropriate for easy access to any Portacath or PICC line.   We strive to give you quality time with your provider. You may need to reschedule your appointment if you arrive late (15 or more minutes).  Arriving late affects you and other patients whose appointments are after yours.  Also, if you miss three or more appointments without notifying the office, you may be dismissed from the clinic at the provider's discretion.      For prescription refill requests, have your pharmacy contact our office and allow 72 hours for refills to be completed.    Today you received the following chemotherapy and/or immunotherapy agents: doxorubicin       To help prevent nausea and vomiting after your treatment, we encourage you to take your nausea medication as directed.  BELOW ARE SYMPTOMS THAT SHOULD BE REPORTED IMMEDIATELY: *FEVER GREATER THAN 100.4 F (38 C) OR HIGHER *CHILLS OR SWEATING *NAUSEA AND VOMITING THAT IS NOT CONTROLLED WITH YOUR NAUSEA MEDICATION *UNUSUAL SHORTNESS OF BREATH *UNUSUAL BRUISING OR BLEEDING *URINARY PROBLEMS (pain or burning when urinating, or frequent urination) *BOWEL PROBLEMS (unusual diarrhea, constipation, pain near the anus) TENDERNESS IN MOUTH AND THROAT WITH OR WITHOUT PRESENCE OF ULCERS (sore throat, sores in mouth, or a toothache) UNUSUAL RASH, SWELLING OR PAIN  UNUSUAL VAGINAL DISCHARGE OR ITCHING   Items with * indicate a potential emergency and should be followed up as soon as possible or go to the Emergency Department if any problems should occur.  Please show the CHEMOTHERAPY ALERT CARD or  IMMUNOTHERAPY ALERT CARD at check-in to the Emergency Department and triage nurse.  Should you have questions after your visit or need to cancel or reschedule your appointment, please contact [facility] - A DEPT OF [facility]  Dept: [phone]  and follow the prompts.  Office hours are 8:00 a.m. to 4:30 p.m. Monday - Friday. Please note that voicemails left after 4:00 p.m. may not be returned until the following business day.  We are closed weekends and major holidays. You have access to a nurse at all times for urgent questions. Please call the main number to the clinic Dept: [phone] and follow the prompts.   For any non-urgent questions, you may also contact your provider using MyChart. We now offer e-Visits for anyone 89 and older to request care online for non-urgent symptoms. For details visit [email or web address].   Also download the MyChart app! Go to the app store, search MyChart, open the app, select [place], and log in with your MyChart username and password.

## 2024-06-03 NOTE — Patient Instructions (Signed)

## 2024-06-03 NOTE — Progress Notes (Signed)
 [facility] OFFICE PROGRESS NOTE   Diagnosis: Endometrial cancer  INTERVAL HISTORY:   Dana [name] returns as scheduled.  She completed cycle 1 Doxil 05/06/2024.  No nausea/vomiting.  She had an ulcer at the lower inner lip.  This did not interfere with the eating.  She used Magic mouthwash.  She has a rash at the right abdominal pannus.  She feels the right groin mass is smaller.  She continues to have pain associated with the mass.  She believes the right thigh mass is slightly larger.  Episodes of nausea and diarrhea remain improved following a change in her diet.  Objective:  Vital signs in last 24 hours:  Blood pressure 130/84, pulse 85, temperature 97.8 F (36.6 C), temperature source Temporal, resp. rate 18, height 5' 1 (1.549 m), weight 169 lb 4.8 oz (76.8 kg), SpO2 100%.    HEENT: No thrush or ulcers Resp: Lungs clear bilaterally Cardio: Regular rate and rhythm GI: No hepatosplenomegaly, firm mass in the right groin with a central fungating component-appears slightly smaller Vascular: Edema throughout the left leg Skin: Brown hyperpigmentation at the right abdominal pannus skin fold, skin thickening at the distal fingers bilaterally, approximate 2.5 cm mass at the right anterior thigh  Portacath/PICC-without erythema  Lab Results:  Lab Results  Component Value Date   WBC 3.3 (L) 06/03/2024   HGB 12.4 06/03/2024   HCT 36.5 06/03/2024   MCV 95.8 06/03/2024   PLT 288 06/03/2024   NEUTROABS 1.7 06/03/2024    CMP  Lab Results  Component Value Date   NA 136 05/06/2024   K 4.2 05/06/2024   CL 101 05/06/2024   CO2 24 05/06/2024   GLUCOSE 93 05/06/2024   BUN 7 (L) 05/06/2024   CREATININE 0.72 05/06/2024   CALCIUM 9.1 05/06/2024   PROT 6.3 (L) 05/06/2024   ALBUMIN 3.9 05/06/2024   AST 17 05/06/2024   ALT 11 05/06/2024   ALKPHOS 72 05/06/2024   BILITOT 0.4 05/06/2024   GFRNONAA >60 05/06/2024   GFRAA >60 04/06/2020     Medications: I have  reviewed the patient's current medications.   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, status post a TAH, BSO, appendectomy, pelvic and periaortic lymphadenectomy, and wedge resection of a liver lesion Gross complete resection, tumor involve both ovaries, lymph nodes negative 6 cycles of adjuvant Taxol /carboplatin  CT abdomen/pelvis 03/28/2019-enlarged right inguinal and iliac nodes 04/10/2019-right inguinal lymph node biopsy-metastatic adenocarcinoma, ER 90%, PR 95%, PD-L1 combined positive score-1, MSS, tumor mutation burden-4 Cisplatin  05/16/2019 20-1 cycle Radiation to the lateral pelvis and 1 lymph nodes November 2020 CT 12/72/020-decrease size of right inguinal and external iliac lymph nodes, no evidence of disease progression Everolimus  and letrozole  07/14/2019 - 04/06/2020 CT 10/20/2019-resolution of right iliac lymph node, decrease in size of right inguinal node CT 03/08/2020-no evidence of recurrent or metastatic disease in the abdomen or pelvis CT 05/31/2020-no evidence of recurrent or metastatic disease CT 03/11/2021-increase right inguinal lymph node, enlargement of bilateral inguinal lymph nodes, subcutaneous soft tissue nodule at the anterior right thigh Carboplatin  04/06/2021, 04/25/2021, 05/16/2021 CT 07/12/2021-mild decrease in size of bilateral inguinal lymphadenopathy, no progressive disease in the abdomen or pelvis, 0.6 cm left inguinal node was 0.8 cm previously Letrozole  08/06/2021 CT abdomen/pelvis 04/10/2022-stable right inguinal lymph node, no new site of metastatic disease CT right femur  04/10/2022 -2.2 x 1 cm anterior subcutaneous mass Letrozole  continued CT right femur 07/19/2022-mild enlargement of right thigh subcutaneous mass stable right inguinal nodal  mass CTs 07/19/2022-stable right inguinal lymph node, no new or progressive disease 11/20/2022-right thigh mass larger and tender, letrozole  discontinued 11/28/2022-significant  enlargement of anterior right thigh soft tissue mass, slightly larger right inguinal mass 12/25/2022-wide local excision of right thigh mass, metastatic endometrial adenocarcinoma, ER positive, PR positive, Her 2 negative(0),negative resection margins Cycle 1 pembrolizumab /lenvatinib  02/05/2023 Cycle 2 Pembrolizumab  02/27/2023, lenvatinib  placed on hold due to mucositis and hypertension Cycle 3 Pembrolizumab  03/20/2023, lenvatinib  resumed at a dose of 10 mg daily Lenvatinib  discontinued 03/30/2023 due to mucositis, hand-foot symptoms Afinitor /letrozole  04/21/2023 CTs 08/10/2023: Increased nodular tissue anterior to the right hip, new 6 mm right lung nodule CT right femur 08/10/2023: No evidence of recurrent tumor at the right anterior thigh, enlargement of right inguinal soft tissue mass with an additional enlarging mass inferiorly Cycle 1 Carboplatin  11/06/2023 Cycle 2 carboplatin  11/26/2023, dose reduction to AUC 4 due to f mild thrombocytopenia, home Decadron  prophylaxis Treatment held due to neutropenia 12/18/2023 Cycle 3 carboplatin  12/25/2023, dose reduction due to neutropenia following cycle 2, patient declines white cell growth factor support Cycle 4 carboplatin  01/14/2024 CT right femur 02/03/2024-enlarging right inguinal mass CTs 02/03/2024: Previous right lower lobe nodule now measures 0.2 x 0.3 cm-favor benign versus treatment response, increased masslike thickening in the right groin 03/25/24: [employer]: FOLR1 negative (5%) 04/23/2024 echocardiogram: LVEF 60-65% Cycle 1 Doxil 05/06/2024 Cycle 2 Doxil 06/03/2024   2.  Left lower extremity lymphedema, improved after therapy in the lymphedema clinic   3.  Chronic diarrhea controlled with Lomotil  05/12/2024 upper endoscopy: Hiatal hernia, Barrett's esophagus-no dysplasia, duodenal diverticulum 05/12/2024 colonoscopy: Diverticula, ascending colon polyp removed-tubular adenoma     Disposition: Dana [name] appears stable.  She tolerated the first  cycle of Doxil well.  The dominant right groin mass appears slightly smaller.  She will complete cycle 2 Doxil today.  She will use Magic mouthwash and Orajel if she develops mouth ulcers following chemotherapy.  The neutrophil count is borderline low today.  She will return for a nadir CBC in 2 weeks.  She will call for any fever or symptoms of an infection.  Dana [name] will be scheduled for an office visit and cycle 3 Doxil in 4 weeks.  Arley Hof, MD  06/03/2024  11:19 AM

## 2024-06-03 NOTE — Progress Notes (Signed)
 Patient seen by Dr. Arley Hof today  Vitals are within treatment parameters:Yes   Labs are within treatment parameters: Yes   Treatment plan has been signed: Yes   Per physician team, Patient is ready for treatment and there are NO modifications to the treatment plan.

## 2024-06-04 ENCOUNTER — Telehealth: Payer: Self-pay | Admitting: *Deleted

## 2024-06-04 NOTE — Telephone Encounter (Signed)
 Patient called to report her schedule is incorrect and wants to speak with scheduler. Notified scheduler to call her, as it is incorrect.

## 2024-06-05 ENCOUNTER — Other Ambulatory Visit: Payer: Self-pay

## 2024-06-09 ENCOUNTER — Ambulatory Visit: Admitting: Gastroenterology

## 2024-06-16 ENCOUNTER — Inpatient Hospital Stay: Admitting: Nutrition

## 2024-06-16 ENCOUNTER — Inpatient Hospital Stay: Admitting: Oncology

## 2024-06-16 ENCOUNTER — Ambulatory Visit: Payer: Self-pay | Admitting: Oncology

## 2024-06-16 ENCOUNTER — Inpatient Hospital Stay

## 2024-06-16 ENCOUNTER — Inpatient Hospital Stay: Attending: Hematology and Oncology

## 2024-06-16 DIAGNOSIS — C7951 Secondary malignant neoplasm of bone: Secondary | ICD-10-CM | POA: Diagnosis not present

## 2024-06-16 DIAGNOSIS — Z5111 Encounter for antineoplastic chemotherapy: Secondary | ICD-10-CM | POA: Diagnosis present

## 2024-06-16 DIAGNOSIS — C55 Malignant neoplasm of uterus, part unspecified: Secondary | ICD-10-CM

## 2024-06-16 DIAGNOSIS — Z79632 Long term (current) use of antitumor antibiotic: Secondary | ICD-10-CM | POA: Insufficient documentation

## 2024-06-16 DIAGNOSIS — C541 Malignant neoplasm of endometrium: Secondary | ICD-10-CM | POA: Diagnosis present

## 2024-06-16 DIAGNOSIS — C774 Secondary and unspecified malignant neoplasm of inguinal and lower limb lymph nodes: Secondary | ICD-10-CM | POA: Insufficient documentation

## 2024-06-16 LAB — CBC WITH DIFFERENTIAL (CANCER CENTER ONLY)
Abs Immature Granulocytes: 0.01 K/uL (ref 0.00–0.07)
Basophils Absolute: 0 K/uL (ref 0.0–0.1)
Basophils Relative: 0 %
Eosinophils Absolute: 0.1 K/uL (ref 0.0–0.5)
Eosinophils Relative: 3 %
HCT: 37.8 % (ref 36.0–46.0)
Hemoglobin: 12.8 g/dL (ref 12.0–15.0)
Immature Granulocytes: 0 %
Lymphocytes Relative: 22 %
Lymphs Abs: 1 K/uL (ref 0.7–4.0)
MCH: 32.1 pg (ref 26.0–34.0)
MCHC: 33.9 g/dL (ref 30.0–36.0)
MCV: 94.7 fL (ref 80.0–100.0)
Monocytes Absolute: 0.3 K/uL (ref 0.1–1.0)
Monocytes Relative: 7 %
Neutro Abs: 3 K/uL (ref 1.7–7.7)
Neutrophils Relative %: 68 %
Platelet Count: 224 K/uL (ref 150–400)
RBC: 3.99 MIL/uL (ref 3.87–5.11)
RDW: 13.2 % (ref 11.5–15.5)
WBC Count: 4.4 K/uL (ref 4.0–10.5)
nRBC: 0 % (ref 0.0–0.2)

## 2024-06-16 NOTE — Progress Notes (Signed)
 Nutrition follow up completed with patient after lab draw.  73 year old patient receiving Doxil  for Endometrial cancer and followed by Dr. Cloretta.  PMH includes GERD, Hypothyroidism, Lymphedema, Neuropathy.  Medications include Lomotil , Pepcid , Synthroid .  Height:5' 1. Weight: 169 pounds 4.8 oz November 18. 180 pounds July 21. UBW: 175- 180 pounds BMI: 31.99.  Spoke with patient after lab draw due to + MST. (Weight loss and poor appetite). She reports that her GI doctor told her she has silent reflux and prescribed Prilosec but it wasn't effective. She now takes Pepcid  and reports it is better but not resolved. She doesn't know what to eat because she doesn't want to feel bad. Also experiences taste alterations. Noted she developed an ulcer on her lower lip and started MMW. Denies nausea and diarrhea. Reports that Enterade really helped her when she took it in the past but she isn't using it now because it is so expensive. She has developed lactose intolerance but can eat cheese. Has been afraid to try ONS because she thought they had milk in them  Nutrition Diagnosis: Unintended weight loss related to cancer and associated treatments as evidenced by 6% loss over 4 months.  Intervention: Educated on diet to improve reflux. Encouraged her to reach out to MD and communicate results of Pepcid . Encouraged her to begin baking soda and salt water gargles to help mouth sores and improve taste alterations. Encouraged small frequent meals and snacks.  Educated that ONS such as Ensure or Boost do not contain lactose. Provided samples and coupons. Recommended she keep a food journal and document foods that are not well tolerated. Nutrition fact sheets provided. Questions answered and contact information given.  Monitoring ,Evaluation, Goals: Tolerate adequate calories and protein to minimize further weight loss  No follow up scheduled. Patient has RD contact information for questions or  concerns.

## 2024-07-01 ENCOUNTER — Inpatient Hospital Stay

## 2024-07-01 ENCOUNTER — Inpatient Hospital Stay: Admitting: Nurse Practitioner

## 2024-07-01 ENCOUNTER — Encounter: Payer: Self-pay | Admitting: Nurse Practitioner

## 2024-07-01 VITALS — BP 138/86 | HR 76 | Temp 97.6°F | Resp 16 | Wt 165.4 lb

## 2024-07-01 VITALS — BP 134/84 | HR 70 | Temp 98.0°F | Resp 18

## 2024-07-01 DIAGNOSIS — C55 Malignant neoplasm of uterus, part unspecified: Secondary | ICD-10-CM

## 2024-07-01 DIAGNOSIS — Z7189 Other specified counseling: Secondary | ICD-10-CM

## 2024-07-01 DIAGNOSIS — Z5111 Encounter for antineoplastic chemotherapy: Secondary | ICD-10-CM | POA: Diagnosis not present

## 2024-07-01 LAB — CMP (CANCER CENTER ONLY)
ALT: 10 U/L (ref 0–44)
AST: 16 U/L (ref 15–41)
Albumin: 3.7 g/dL (ref 3.5–5.0)
Alkaline Phosphatase: 67 U/L (ref 38–126)
Anion gap: 11 (ref 5–15)
BUN: 9 mg/dL (ref 8–23)
CO2: 24 mmol/L (ref 22–32)
Calcium: 9.3 mg/dL (ref 8.9–10.3)
Chloride: 100 mmol/L (ref 98–111)
Creatinine: 0.69 mg/dL (ref 0.44–1.00)
GFR, Estimated: >60 mL/min (ref >60)
Glucose, Bld: 91 mg/dL (ref 70–99)
Potassium: 4.1 mmol/L (ref 3.5–5.1)
Sodium: 135 mmol/L (ref 135–145)
Total Bilirubin: 0.4 mg/dL (ref 0.0–1.2)
Total Protein: 6.4 g/dL — ABNORMAL LOW (ref 6.5–8.1)

## 2024-07-01 LAB — CBC WITH DIFFERENTIAL (CANCER CENTER ONLY)
Abs Immature Granulocytes: 0 K/uL (ref 0.00–0.07)
Basophils Absolute: 0 K/uL (ref 0.0–0.1)
Basophils Relative: 1 %
Eosinophils Absolute: 0.1 K/uL (ref 0.0–0.5)
Eosinophils Relative: 2 %
HCT: 36.7 % (ref 36.0–46.0)
Hemoglobin: 12.5 g/dL (ref 12.0–15.0)
Immature Granulocytes: 0 %
Lymphocytes Relative: 23 %
Lymphs Abs: 0.9 K/uL (ref 0.7–4.0)
MCH: 32.2 pg (ref 26.0–34.0)
MCHC: 34.1 g/dL (ref 30.0–36.0)
MCV: 94.6 fL (ref 80.0–100.0)
Monocytes Absolute: 0.8 K/uL (ref 0.1–1.0)
Monocytes Relative: 20 %
Neutro Abs: 2 K/uL (ref 1.7–7.7)
Neutrophils Relative %: 54 %
Platelet Count: 247 K/uL (ref 150–400)
RBC: 3.88 MIL/uL (ref 3.87–5.11)
RDW: 14.2 % (ref 11.5–15.5)
WBC Count: 3.7 K/uL — ABNORMAL LOW (ref 4.0–10.5)
nRBC: 0 % (ref 0.0–0.2)

## 2024-07-01 MED ORDER — DEXTROSE 5 % IV SOLN: INTRAVENOUS | Status: DC

## 2024-07-01 MED ORDER — DEXAMETHASONE SOD PHOSPHATE PF 10 MG/ML IJ SOLN
10.0000 mg | Freq: Once | INTRAMUSCULAR | Status: AC
  Administered 2024-07-01: 13:00:00 10 mg via INTRAVENOUS

## 2024-07-01 MED ORDER — DOXORUBICIN HCL LIPOSOMAL CHEMO INJECTION 2 MG/ML
38.0000 mg/m2 | Freq: Once | INTRAVENOUS | Status: AC
  Administered 2024-07-01: 14:00:00 70 mg via INTRAVENOUS
  Filled 2024-07-01: qty 10

## 2024-07-01 NOTE — Progress Notes (Signed)
 [facility] OFFICE PROGRESS NOTE   Diagnosis: Endometrial cancer  INTERVAL HISTORY:   Dana [name] returns as scheduled.  She completed cycle 2 Doxil  06/03/2024.  She denies nausea/vomiting.  Intermittent mouth tenderness, no ulcers.  No diarrhea following cycle 2.  She notes a rash at the anterior chest and posterior left axilla.  She has increased pain at the right leg.  She thinks the right thigh mass is larger.  Objective:  Vital signs in last 24 hours:  Blood pressure 138/86, pulse 76, temperature 97.6 F (36.4 C), temperature source Temporal, resp. rate 16, weight 165 lb 6.4 oz (75 kg), SpO2 100%.    HEENT: No thrush or ulcers. Resp: Lungs clear bilaterally. Cardio: Regular rate and rhythm. GI: No hepatosplenomegaly.  Firm mass in the right groin, appears smaller. Vascular: Edema throughout the left leg. Skin: Erythematous macular rash low anterior chest; dry appearing rash/skin hyperpigmentation left greater than right posterior axilla extending to the back.  Right thigh mass may be larger, partially due to a fluctuant component. Port-A-Cath without erythema.  Lab Results:  Lab Results  Component Value Date   WBC 3.7 (L) 07/01/2024   HGB 12.5 07/01/2024   HCT 36.7 07/01/2024   MCV 94.6 07/01/2024   PLT 247 07/01/2024   NEUTROABS 2.0 07/01/2024    Imaging:  No results found.  Medications: I have reviewed the patient's current medications.  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, status post a TAH, BSO, appendectomy, pelvic and periaortic lymphadenectomy, and wedge resection of a liver lesion Gross complete resection, tumor involve both ovaries, lymph nodes negative 6 cycles of adjuvant Taxol /carboplatin  CT abdomen/pelvis 03/28/2019-enlarged right inguinal and iliac nodes 04/10/2019-right inguinal lymph node biopsy-metastatic adenocarcinoma, ER 90%, PR 95%, PD-L1 combined positive score-1, MSS, tumor  mutation burden-4 Cisplatin  05/16/2019 20-1 cycle Radiation to the lateral pelvis and 1 lymph nodes November 2020 CT 12/72/020-decrease size of right inguinal and external iliac lymph nodes, no evidence of disease progression Everolimus  and letrozole  07/14/2019 - 04/06/2020 CT 10/20/2019-resolution of right iliac lymph node, decrease in size of right inguinal node CT 03/08/2020-no evidence of recurrent or metastatic disease in the abdomen or pelvis CT 05/31/2020-no evidence of recurrent or metastatic disease CT 03/11/2021-increase right inguinal lymph node, enlargement of bilateral inguinal lymph nodes, subcutaneous soft tissue nodule at the anterior right thigh Carboplatin  04/06/2021, 04/25/2021, 05/16/2021 CT 07/12/2021-mild decrease in size of bilateral inguinal lymphadenopathy, no progressive disease in the abdomen or pelvis, 0.6 cm left inguinal node was 0.8 cm previously Letrozole  08/06/2021 CT abdomen/pelvis 04/10/2022-stable right inguinal lymph node, no new site of metastatic disease CT right femur  04/10/2022 -2.2 x 1 cm anterior subcutaneous mass Letrozole  continued CT right femur 07/19/2022-mild enlargement of right thigh subcutaneous mass stable right inguinal nodal mass CTs 07/19/2022-stable right inguinal lymph node, no new or progressive disease 11/20/2022-right thigh mass larger and tender, letrozole  discontinued 11/28/2022-significant enlargement of anterior right thigh soft tissue mass, slightly larger right inguinal mass 12/25/2022-wide local excision of right thigh mass, metastatic endometrial adenocarcinoma, ER positive, PR positive, Her 2 negative(0),negative resection margins Cycle 1 pembrolizumab /lenvatinib  02/05/2023 Cycle 2 Pembrolizumab  02/27/2023, lenvatinib  placed on hold due to mucositis and hypertension Cycle 3 Pembrolizumab  03/20/2023, lenvatinib  resumed at a dose of 10 mg daily Lenvatinib  discontinued 03/30/2023 due to mucositis, hand-foot symptoms Afinitor /letrozole   04/21/2023 CTs 08/10/2023: Increased nodular tissue anterior to the right hip, new 6 mm right lung nodule CT right femur 08/10/2023: No evidence of recurrent tumor at the right  anterior thigh, enlargement of right inguinal soft tissue mass with an additional enlarging mass inferiorly Cycle 1 Carboplatin  11/06/2023 Cycle 2 carboplatin  11/26/2023, dose reduction to AUC 4 due to f mild thrombocytopenia, home Decadron  prophylaxis Treatment held due to neutropenia 12/18/2023 Cycle 3 carboplatin  12/25/2023, dose reduction due to neutropenia following cycle 2, patient declines white cell growth factor support Cycle 4 carboplatin  01/14/2024 CT right femur 02/03/2024-enlarging right inguinal mass CTs 02/03/2024: Previous right lower lobe nodule now measures 0.2 x 0.3 cm-favor benign versus treatment response, increased masslike thickening in the right groin 03/25/24: neogenomics: FOLR1 negative (5%) 04/23/2024 echocardiogram: LVEF 60-65% Cycle 1 Doxil  05/06/2024 Cycle 2 Doxil  06/03/2024 Cycle 3 Doxil  07/01/2024   2.  Left lower extremity lymphedema, improved after therapy in the lymphedema clinic   3.  Chronic diarrhea controlled with Lomotil  05/12/2024 upper endoscopy: Hiatal hernia, Barrett's esophagus-no dysplasia, duodenal diverticulum 05/12/2024 colonoscopy: Diverticula, ascending colon polyp removed-tubular adenoma    Disposition: Dana [name] appears stable.  She has completed 2 cycles of Doxil .  Plan to proceed with cycle 3 today as scheduled.  Restaging CTs before next office visit.  CBC and chemistry panel reviewed.  Labs are adequate to proceed with treatment.  She will return for follow-up in 4 weeks.  We are available to see her sooner if needed.  Patient seen with Dr. Cloretta.    Olam Ned ANP/GNP-BC   07/01/2024  11:11 AM  This was a shared visit with Olam Ned.  Dana [name] is interviewed and examined.  She has completed 2 cycles of Doxil .  She has tolerated the Doxil  well.  She has a  mild rash under the arms.  The fungating component and the deeper mass at the right groin appear slightly smaller.  The lesion at the right anterior thigh appears stable in size with a softer distal component.  She will complete a third cycle of Doxil  today.  She will undergo a restaging CT evaluation after this cycle.  Arvella Cloretta, MD

## 2024-07-01 NOTE — Progress Notes (Signed)
 Patient seen by Olam Ned NP today  Vitals are within treatment parameters:Yes   Labs are within treatment parameters: Yes   Treatment plan has been signed: Yes   Per physician team, Patient is ready for treatment and there are NO modifications to the treatment plan.

## 2024-07-01 NOTE — Patient Instructions (Signed)
 [facility] - A DEPT OF . [facility]  Discharge Instructions: Thank you for choosing [facility] to provide your oncology and hematology care.   If you have a lab appointment with the Cancer Center, please go directly to the Cancer Center and check in at the registration area.   Wear comfortable clothing and clothing appropriate for easy access to any Portacath or PICC line.   We strive to give you quality time with your provider. You may need to reschedule your appointment if you arrive late (15 or more minutes).  Arriving late affects you and other patients whose appointments are after yours.  Also, if you miss three or more appointments without notifying the office, you may be dismissed from the clinic at the provider's discretion.      For prescription refill requests, have your pharmacy contact our office and allow 72 hours for refills to be completed.    Today you received the following chemotherapy and/or immunotherapy agents: doxorubicin       To help prevent nausea and vomiting after your treatment, we encourage you to take your nausea medication as directed.  BELOW ARE SYMPTOMS THAT SHOULD BE REPORTED IMMEDIATELY: *FEVER GREATER THAN 100.4 F (38 C) OR HIGHER *CHILLS OR SWEATING *NAUSEA AND VOMITING THAT IS NOT CONTROLLED WITH YOUR NAUSEA MEDICATION *UNUSUAL SHORTNESS OF BREATH *UNUSUAL BRUISING OR BLEEDING *URINARY PROBLEMS (pain or burning when urinating, or frequent urination) *BOWEL PROBLEMS (unusual diarrhea, constipation, pain near the anus) TENDERNESS IN MOUTH AND THROAT WITH OR WITHOUT PRESENCE OF ULCERS (sore throat, sores in mouth, or a toothache) UNUSUAL RASH, SWELLING OR PAIN  UNUSUAL VAGINAL DISCHARGE OR ITCHING   Items with * indicate a potential emergency and should be followed up as soon as possible or go to the Emergency Department if any problems should occur.  Please show the CHEMOTHERAPY ALERT CARD or  IMMUNOTHERAPY ALERT CARD at check-in to the Emergency Department and triage nurse.  Should you have questions after your visit or need to cancel or reschedule your appointment, please contact [facility] - A DEPT OF [facility]  Dept: [phone]  and follow the prompts.  Office hours are 8:00 a.m. to 4:30 p.m. Monday - Friday. Please note that voicemails left after 4:00 p.m. may not be returned until the following business day.  We are closed weekends and major holidays. You have access to a nurse at all times for urgent questions. Please call the main number to the clinic Dept: [phone] and follow the prompts.   For any non-urgent questions, you may also contact your provider using MyChart. We now offer e-Visits for anyone 89 and older to request care online for non-urgent symptoms. For details visit [email or web address].   Also download the MyChart app! Go to the app store, search MyChart, open the app, select [place], and log in with your MyChart username and password.

## 2024-07-08 ENCOUNTER — Other Ambulatory Visit: Payer: Self-pay | Admitting: Physician Assistant

## 2024-07-18 ENCOUNTER — Telehealth: Payer: Self-pay | Admitting: *Deleted

## 2024-07-18 ENCOUNTER — Other Ambulatory Visit (HOSPITAL_BASED_OUTPATIENT_CLINIC_OR_DEPARTMENT_OTHER): Payer: Self-pay

## 2024-07-18 ENCOUNTER — Inpatient Hospital Stay: Attending: Hematology and Oncology

## 2024-07-18 ENCOUNTER — Inpatient Hospital Stay: Attending: Hematology and Oncology | Admitting: Nurse Practitioner

## 2024-07-18 ENCOUNTER — Inpatient Hospital Stay

## 2024-07-18 ENCOUNTER — Encounter: Payer: Self-pay | Admitting: Nurse Practitioner

## 2024-07-18 VITALS — BP 114/82 | HR 98 | Temp 98.4°F | Resp 18 | Ht 61.0 in | Wt 162.0 lb

## 2024-07-18 DIAGNOSIS — R911 Solitary pulmonary nodule: Secondary | ICD-10-CM | POA: Diagnosis not present

## 2024-07-18 DIAGNOSIS — G620 Drug-induced polyneuropathy: Secondary | ICD-10-CM | POA: Insufficient documentation

## 2024-07-18 DIAGNOSIS — C541 Malignant neoplasm of endometrium: Secondary | ICD-10-CM | POA: Insufficient documentation

## 2024-07-18 DIAGNOSIS — C7951 Secondary malignant neoplasm of bone: Secondary | ICD-10-CM | POA: Diagnosis not present

## 2024-07-18 DIAGNOSIS — R1909 Other intra-abdominal and pelvic swelling, mass and lump: Secondary | ICD-10-CM | POA: Insufficient documentation

## 2024-07-18 DIAGNOSIS — T451X5A Adverse effect of antineoplastic and immunosuppressive drugs, initial encounter: Secondary | ICD-10-CM | POA: Diagnosis not present

## 2024-07-18 DIAGNOSIS — L271 Localized skin eruption due to drugs and medicaments taken internally: Secondary | ICD-10-CM | POA: Insufficient documentation

## 2024-07-18 DIAGNOSIS — C55 Malignant neoplasm of uterus, part unspecified: Secondary | ICD-10-CM | POA: Diagnosis not present

## 2024-07-18 DIAGNOSIS — Z79811 Long term (current) use of aromatase inhibitors: Secondary | ICD-10-CM | POA: Insufficient documentation

## 2024-07-18 LAB — CBC WITH DIFFERENTIAL (CANCER CENTER ONLY)
Abs Immature Granulocytes: 0.01 K/uL (ref 0.00–0.07)
Basophils Absolute: 0 K/uL (ref 0.0–0.1)
Basophils Relative: 1 %
Eosinophils Absolute: 0.1 K/uL (ref 0.0–0.5)
Eosinophils Relative: 1 %
HCT: 37.8 % (ref 36.0–46.0)
Hemoglobin: 13 g/dL (ref 12.0–15.0)
Immature Granulocytes: 0 %
Lymphocytes Relative: 17 %
Lymphs Abs: 0.7 K/uL (ref 0.7–4.0)
MCH: 32.1 pg (ref 26.0–34.0)
MCHC: 34.4 g/dL (ref 30.0–36.0)
MCV: 93.3 fL (ref 80.0–100.0)
Monocytes Absolute: 0.4 K/uL (ref 0.1–1.0)
Monocytes Relative: 9 %
Neutro Abs: 3.2 K/uL (ref 1.7–7.7)
Neutrophils Relative %: 72 %
Platelet Count: 235 K/uL (ref 150–400)
RBC: 4.05 MIL/uL (ref 3.87–5.11)
RDW: 14.4 % (ref 11.5–15.5)
WBC Count: 4.4 K/uL (ref 4.0–10.5)
nRBC: 0 % (ref 0.0–0.2)

## 2024-07-18 LAB — CMP (CANCER CENTER ONLY)
ALT: 8 U/L (ref 0–44)
AST: 15 U/L (ref 15–41)
Albumin: 3.9 g/dL (ref 3.5–5.0)
Alkaline Phosphatase: 73 U/L (ref 38–126)
Anion gap: 11 (ref 5–15)
BUN: 7 mg/dL — ABNORMAL LOW (ref 8–23)
CO2: 24 mmol/L (ref 22–32)
Calcium: 9.6 mg/dL (ref 8.9–10.3)
Chloride: 96 mmol/L — ABNORMAL LOW (ref 98–111)
Creatinine: 0.75 mg/dL (ref 0.44–1.00)
GFR, Estimated: >60 mL/min
Glucose, Bld: 106 mg/dL — ABNORMAL HIGH (ref 70–99)
Potassium: 4.1 mmol/L (ref 3.5–5.1)
Sodium: 131 mmol/L — ABNORMAL LOW (ref 135–145)
Total Bilirubin: 0.5 mg/dL (ref 0.0–1.2)
Total Protein: 6.8 g/dL (ref 6.5–8.1)

## 2024-07-18 MED ORDER — SULFAMETHOXAZOLE-TRIMETHOPRIM 800-160 MG PO TABS
1.0000 | ORAL_TABLET | Freq: Two times a day (BID) | ORAL | 0 refills | Status: DC
  Filled 2024-07-18: qty 20, 10d supply, fill #0

## 2024-07-18 NOTE — Telephone Encounter (Signed)
 Ms. Deemer called to request appointment today to be seen for new right groin mass that is golf ball sized and very painful. Started over the weekend and is getting larger w/pain. Can't stand her panties to touch it. Is concerned she has infection (no fever). Mass is not open or red. She also has had progression of her body rash since last treatment.  Informed her that we can work her in today-come now.

## 2024-07-18 NOTE — Progress Notes (Signed)
 " Dana [name] OFFICE PROGRESS NOTE   Diagnosis: Endometrial cancer  INTERVAL HISTORY:   Dana [name] returns prior to scheduled follow-up.  She completed cycle 3 Doxil  07/01/2024.  She contacted the office this morning requesting evaluation of a new right groin mass with associated pain.  Dana [name] reports noticing a big lump with associated pain in the medial right groin 5 to 6 days ago.  Symptoms continue to worsen.  She tried ice which helped a little.  Tylenol  has not been effective.  No fever.  She has felt cold since yesterday.  No shaking chills.  She is having more nausea.  Skin rash is worse.  Objective:  Vital signs in last 24 hours:  Blood pressure 114/82, pulse 98, temperature 98.4 F (36.9 C), temperature source Temporal, resp. rate 18, height 5' 1 (1.549 m), weight 162 lb (73.5 kg), SpO2 99%.    HEENT: No thrush or ulcers. Resp: Lungs clear bilaterally. Cardio: Regular rate and rhythm. GI: No hepatosplenomegaly. Vascular: Edema throughout the left leg. Skin: Erythematous macular rash on the trunk.  Soles are dry appearing.  Palms spared.  Somewhat fluctuant right thigh mass.  Firm masses in the right groin.  Medially there is significant firm edema/erythema, fluctuant component, extending into the pubic region, marked associated tenderness. Port-A-Cath without erythema.  Lab Results:  Lab Results  Component Value Date   WBC 3.7 (L) 07/01/2024   HGB 12.5 07/01/2024   HCT 36.7 07/01/2024   MCV 94.6 07/01/2024   PLT 247 07/01/2024   NEUTROABS 2.0 07/01/2024    Imaging:  No results found.  Medications: I have reviewed the patient's current medications.  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, status post a TAH, BSO, appendectomy, pelvic and periaortic lymphadenectomy, and wedge resection of a liver lesion Gross complete resection, tumor involve both ovaries, lymph nodes negative 6 cycles of  adjuvant Taxol /carboplatin  CT abdomen/pelvis 03/28/2019-enlarged right inguinal and iliac nodes 04/10/2019-right inguinal lymph node biopsy-metastatic adenocarcinoma, ER 90%, PR 95%, PD-L1 combined positive score-1, MSS, tumor mutation burden-4 Cisplatin  05/16/2019 20-1 cycle Radiation to the lateral pelvis and 1 lymph nodes November 2020 CT 12/72/020-decrease size of right inguinal and external iliac lymph nodes, no evidence of disease progression Everolimus  and letrozole  07/14/2019 - 04/06/2020 CT 10/20/2019-resolution of right iliac lymph node, decrease in size of right inguinal node CT 03/08/2020-no evidence of recurrent or metastatic disease in the abdomen or pelvis CT 05/31/2020-no evidence of recurrent or metastatic disease CT 03/11/2021-increase right inguinal lymph node, enlargement of bilateral inguinal lymph nodes, subcutaneous soft tissue nodule at the anterior right thigh Carboplatin  04/06/2021, 04/25/2021, 05/16/2021 CT 07/12/2021-mild decrease in size of bilateral inguinal lymphadenopathy, no progressive disease in the abdomen or pelvis, 0.6 cm left inguinal node was 0.8 cm previously Letrozole  08/06/2021 CT abdomen/pelvis 04/10/2022-stable right inguinal lymph node, no new site of metastatic disease CT right femur  04/10/2022 -2.2 x 1 cm anterior subcutaneous mass Letrozole  continued CT right femur 07/19/2022-mild enlargement of right thigh subcutaneous mass stable right inguinal nodal mass CTs 07/19/2022-stable right inguinal lymph node, no new or progressive disease 11/20/2022-right thigh mass larger and tender, letrozole  discontinued 11/28/2022-significant enlargement of anterior right thigh soft tissue mass, slightly larger right inguinal mass 12/25/2022-wide local excision of right thigh mass, metastatic endometrial adenocarcinoma, ER positive, PR positive, Her 2 negative(0),negative resection margins Cycle 1 pembrolizumab /lenvatinib  02/05/2023 Cycle 2 Pembrolizumab  02/27/2023, lenvatinib   placed on hold due to mucositis and hypertension Cycle 3 Pembrolizumab  03/20/2023, lenvatinib  resumed at  a dose of 10 mg daily Lenvatinib  discontinued 03/30/2023 due to mucositis, hand-foot symptoms Afinitor /letrozole  04/21/2023 CTs 08/10/2023: Increased nodular tissue anterior to the right hip, new 6 mm right lung nodule CT right femur 08/10/2023: No evidence of recurrent tumor at the right anterior thigh, enlargement of right inguinal soft tissue mass with an additional enlarging mass inferiorly Cycle 1 Carboplatin  11/06/2023 Cycle 2 carboplatin  11/26/2023, dose reduction to AUC 4 due to f mild thrombocytopenia, home Decadron  prophylaxis Treatment held due to neutropenia 12/18/2023 Cycle 3 carboplatin  12/25/2023, dose reduction due to neutropenia following cycle 2, patient declines white cell growth factor support Cycle 4 carboplatin  01/14/2024 CT right femur 02/03/2024-enlarging right inguinal mass CTs 02/03/2024: Previous right lower lobe nodule now measures 0.2 x 0.3 cm-favor benign versus treatment response, increased masslike thickening in the right groin 03/25/24: neogenomics: FOLR1 negative (5%) 04/23/2024 echocardiogram: LVEF 60-65% Cycle 1 Doxil  05/06/2024 Cycle 2 Doxil  06/03/2024 Cycle 3 Doxil  07/01/2024   2.  Left lower extremity lymphedema, improved after therapy in the lymphedema clinic   3.  Chronic diarrhea controlled with Lomotil  05/12/2024 upper endoscopy: Hiatal hernia, Barrett's esophagus-no dysplasia, duodenal diverticulum 05/12/2024 colonoscopy: Diverticula, ascending colon polyp removed-tubular adenoma    Disposition: Dana [name] has endometrial cancer.  She has completed 3 cycles of Doxil .  She presents today for an unscheduled visit for evaluation of pain and swelling in the right groin.  She understands symptoms may be due to progression of cancer.  There could be a component of infection.  She will begin a course of Septra .  She is scheduled for restaging CTs 07/20/2024.  She  declines pain medication.  She has Tylenol  with codeine  at home and plans to try this if needed.  She understands to seek evaluation in the emergency department with fever, shaking chills, worsening of current symptoms.  She is scheduled for follow-up 07/29/2024.  She will contact the office in the interim with further problems.  Patient seen with Dr. Cloretta.  Olam Ned ANP/GNP-BC   07/18/2024  11:26 AM This was a shared visit with Olam Ned.  Dana [name] was interviewed and examined.  Increased swelling and erythema in the right groin with associated tenderness.  She may have an infection superimposed on the right inguinal mass.  She will complete a course of antibiotics.  She is scheduled to have restaging CTs 07/20/2024.  Arvella Cloretta, MD       "

## 2024-07-20 ENCOUNTER — Ambulatory Visit (HOSPITAL_BASED_OUTPATIENT_CLINIC_OR_DEPARTMENT_OTHER)
Admission: RE | Admit: 2024-07-20 | Discharge: 2024-07-20 | Disposition: A | Discharge status: Home / Self Care | Source: Ambulatory Visit | Attending: Nurse Practitioner | Admitting: Nurse Practitioner

## 2024-07-20 ENCOUNTER — Ambulatory Visit (HOSPITAL_BASED_OUTPATIENT_CLINIC_OR_DEPARTMENT_OTHER): Admission: RE | Admit: 2024-07-20 | Source: Ambulatory Visit

## 2024-07-20 DIAGNOSIS — C55 Malignant neoplasm of uterus, part unspecified: Secondary | ICD-10-CM | POA: Insufficient documentation

## 2024-07-20 MED ORDER — IOHEXOL 350 MG/ML SOLN
100.0000 mL | Freq: Once | INTRAVENOUS | Status: AC | PRN
  Administered 2024-07-20: 100 mL via INTRAVENOUS

## 2024-07-23 ENCOUNTER — Encounter: Payer: Self-pay | Admitting: Nurse Practitioner

## 2024-07-23 ENCOUNTER — Encounter: Payer: Self-pay | Admitting: Gastroenterology

## 2024-07-23 ENCOUNTER — Ambulatory Visit: Admitting: Gastroenterology

## 2024-07-23 VITALS — BP 114/80 | HR 68 | Ht 61.0 in | Wt 162.2 lb

## 2024-07-23 DIAGNOSIS — R197 Diarrhea, unspecified: Secondary | ICD-10-CM

## 2024-07-23 DIAGNOSIS — K2270 Barrett's esophagus without dysplasia: Secondary | ICD-10-CM | POA: Diagnosis not present

## 2024-07-23 DIAGNOSIS — C55 Malignant neoplasm of uterus, part unspecified: Secondary | ICD-10-CM

## 2024-07-23 DIAGNOSIS — R112 Nausea with vomiting, unspecified: Secondary | ICD-10-CM

## 2024-07-23 DIAGNOSIS — K449 Diaphragmatic hernia without obstruction or gangrene: Secondary | ICD-10-CM | POA: Diagnosis not present

## 2024-07-23 MED ORDER — PANTOPRAZOLE SODIUM 20 MG PO TBEC: 20.0000 mg | DELAYED_RELEASE_TABLET | Freq: Every day | ORAL | 1 refills | Status: AC

## 2024-07-23 NOTE — Progress Notes (Signed)
 "  HPI :  74 year old female here for follow-up visit.  Recall she unfortunately has endometrial cancer that has recurred and she has been treated with radiation and chemotherapy, currently following with oncology.  She was seen in September, had been complaining of some chronic loose stools and abdominal pain at the time.  Was using Lomotil  as needed for her symptoms.  Had tested negative for celiac disease.  Recall she has had imaging suggesting plaque at the origin of the celiac trunk and SMA without critical stenosis or occlusion.  She is also had GERD with history of nausea and vomiting periodically.  Previously Compazine  had helped her, she endorsed intolerance to some PPIs in the past and was put on Pepcid .  For her symptoms she underwent an EGD and colonoscopy with me in October.  Please see details as outlined below.  Endoscopy was remarkable for a large hiatal hernia as well as an 11 cm segment of Barrett's esophagus without dysplasia.  Multiple sites biopsied.  She also tested negative for H. pylori and celiac disease.  Colonoscopy showed a few small polyps, no inflammation in her colon.  Biopsies negative for celiac disease.  I started her on omeprazole  40 mg once daily.  She states she felt jittery on this, and feels that she also had that with Nexium in the past.  She never really has complained of much heartburn or reflux symptoms.  She has been taking Pepcid  nightly which has she thinks helped some of her upper tract symptoms.  She previously took Prilosec for about a week or so.  For her bowel symptoms she also was placed on hyoscyamine  as needed.  She states this is actually really helped her nausea.  She feels that Zofran  gives her headache and Compazine , while previously helping, no longer provides much benefit.  Her nausea and vomiting is quite sporadic and can come and go.  She is rather happy with the hyoscyamine  thus far.  Of note she does have some early satiety which has  limited her eating a bit.  She has lost about 10 pounds in the past few months.  She currently continues to take chemotherapy with oncology, on Doxel.  She has also been using Lomotil  as needed for diarrhea.  She states that has been constipating her lately and has not been taking it too much.  She prefers to take Pepto-Bismol.  She did just have a restaging CT by her oncologist for reassessment of her malignancy on chemotherapy.  That report remains pending  RELEVANT GI HISTORY, IMAGING AND LABS: Results   RADIOLOGY CT angiography: Plaque at celiac trunk and superior mesenteric artery (SMA) without critical stenosis or overt occlusion (01/2024)   2010 colonoscopy in [place] report not available Negative celiac 2016  CTA 02/03/2024 Atheromatous plaque at the origins of the celiac trunk and SMA without critical stenosis or overt occlusion   04/10/24 - alpha gal testing , mildly positive to beef, otherwise negative  04/16/24 - fecal fat negative, pancreatic elastase > 800, C diff negative  EGD 05/12/24: - Esophagogastric landmarks were identified: the Z-line was found at 19 cm, the gastroesophageal junction was found at 30 cm and the upper extent of the gastric folds was found at 37 cm from the incisors. Findings: - A 7 cm hiatal hernia was present. - There were esophageal mucosal changes classified as Barrett's stage C11-M11 per Prague criteria present in the middle third of the esophagus and in the lower third of the esophagus. The maximum  longitudinal extent of these mucosal changes was 11 cm in length. Mucosa was biopsied with a cold forceps for histology (4 biopsies every 2 cms). - The exam of the esophagus was otherwise normal. - Multiple medium sessile polyps were found in the cardia, in the gastric fundus and in the gastric body. Grossly consistent with benign fundic gland polyps. Biopsies were taken with a cold forceps for histology. - The exam of the stomach was otherwise normal. Of note,  due to the size of the hernia, there was poor air insufflation of the stomach, retroflexed views were limited. - Biopsies were taken with a cold forceps for Helicobacter pylori testing. - A diverticulum was found in the second portion of the duodenum. - The exam of the duodenum was otherwise normal. - Biopsies for histology were taken with a cold forceps for evaluation of celiac disease.  Colonoscopy 05/12/24: - Skin tags were found on perianal exam. - The terminal ileum appeared normal. - A 3 to 4 mm polyp was found in the ascending colon. The polyp was sessile. The polyp was removed with a cold snare. Resection and retrieval were complete. - Multiple diverticula were found in the transverse colon and left colon. - Anal papilla(e) were hypertrophied. - Internal hemorrhoids were found during retroflexion. The hemorrhoids were small. - The exam was otherwise without abnormality. - Biopsies for histology were taken with a cold forceps for evaluation of microscopic colitis  FINAL DIAGNOSIS       1. Surgical [P], duodenum :      - BENIGN SMALL BOWEL MUCOSA WITH FOVEOLAR METAPLASIA SUGGESTIVE OF PEPTIC INJURY       2. Surgical [P], random colon :      - BENIGN COLONIC MUCOSA WITH NO SPECIFIC PATHOLOGIC CHANGES      - NEGATIVE FOR INCREASED INTRAEPITHELIAL LYMPHOCYTES OR THICKENED SUBEPITHELIAL      COLLAGEN TABLE      - NEGATIVE FOR DYSPLASIA OR MALIGNANCY       3. Surgical [P], gastric antrum and gastric body :      - MILD CHRONIC GASTRITIS.      - NEGATIVE FOR H. PYLORI ON H&E STAIN      - NO INTESTINAL METAPLASIA, DYSPLASIA, OR MALIGNANCY.       4. Surgical [P], gastric polyps :      - FUNDIC GLAND POLYPS       5. Surgical [P], 30-28 esophagus :      - BARRETT MUCOSA, NO DYSPLASIA       6. Surgical [P], 28-26 esophagus :      - BARRETT MUCOSA, NO DYSPLASIA       7. Surgical [P], 26-24 esophagus :      - BARRETT MUCOSA, NO DYSPLASIA       8. Surgical [P], 24-22 esophagus :      -  BARRETT MUCOSA, NO DYSPLASIA       9. Surgical [P], 22-19 esophagus :      - BARRETT MUCOSA, NO DYSPLASIA       10. Surgical [P], colon, ascending, polyp (1) :      - TUBULAR ADENOMA.      - NO HIGH GRADE DYSPLASIA OR MALIGNANCY.  CT C/A/P 07/20/24 - report pending    Past Medical History:  Diagnosis Date   Arthritis    Asthma    GERD (gastroesophageal reflux disease)    History of blood transfusion    Hypothyroidism    Lymphedema    left leg- ted  hose makes it worse   Neuromuscular disorder (HCC)    neuropathy in feet   Thyroid  disease    Uterine cancer (HCC)    Endometrial cancer - surgery and chemo     Past Surgical History:  Procedure Laterality Date   ABDOMINAL HYSTERECTOMY     APPENDECTOMY     COLONOSCOPY W/ POLYPECTOMY     EXCISION MASS LOWER EXTREMETIES Right 12/25/2022   Procedure: EXCISION OF RIGHT THIGH MASS, MALIGNANT AND ADVANCEMENT FLAP CLOSURE;  Surgeon: Aron Shoulders, MD;  Location: [facility];  Service: General;  Laterality: Right;   EYE SURGERY Bilateral    cataracts   IR IMAGING GUIDED PORT INSERTION  05/15/2019   IR IMAGING GUIDED PORT INSERTION  10/29/2023   IR REMOVAL TUN ACCESS W/ PORT W/O FL MOD SED  06/23/2020   LIVER BIOPSY     mass removed     2015 from pelvis   TOTAL HIP ARTHROPLASTY Right 07/13/2016   Procedure: RIGHT TOTAL HIP ARTHROPLASTY ANTERIOR APPROACH;  Surgeon: Kay CHRISTELLA Cummins, MD;  Location: [facility];  Service: Orthopedics;  Laterality: Right;   Family History  Problem Relation Age of Onset   Heart disease Mother    Multiple myeloma Mother    Breast cancer Neg Hx    Ovarian cancer Neg Hx    Pancreatic cancer Neg Hx    Colon cancer Neg Hx    Endometrial cancer Neg Hx    Prostate cancer Neg Hx    Esophageal cancer Neg Hx    Rectal cancer Neg Hx    Stomach cancer Neg Hx    Social History[1] Current Outpatient Medications  Medication Sig Dispense Refill   acetaminophen  (TYLENOL ) 500 MG tablet Take 500 mg by mouth  as needed. (Patient taking differently: Take 250 mg by mouth as needed.)     diphenoxylate -atropine  (LOMOTIL ) 2.5-0.025 MG tablet TAKE ONE TO TWO TABLETS BY MOUTH FOUR TIMES A DAY AS NEEDED FOR DIARRHEA / LOOSE STOOLS (Patient taking differently: Take 1 tablet by mouth 4 (four) times daily as needed.) 90 tablet 0   famotidine  (PEPCID ) 40 MG tablet TAKE 1 TABLET BY MOUTH AT BEDTIME 90 tablet 0   hyoscyamine  (LEVSIN  SL) 0.125 MG SL tablet Place 1 tablet (0.125 mg total) under the tongue every 6 (six) hours as needed for cramping (nausea, diarrhea). 50 tablet 1   levothyroxine  (SYNTHROID ) 88 MCG tablet Take 88 mcg by mouth daily.     lidocaine -prilocaine  (EMLA ) cream Apply 1 Application topically as needed. Apply to portacath 1-2 hours prior to use 30 g 3   losartan  (COZAAR ) 25 MG tablet TAKE 1 TABLET BY MOUTH DAILY 90 tablet 1   prochlorperazine  (COMPAZINE ) 10 MG tablet Take 10 mg by mouth every 6 (six) hours as needed for nausea or vomiting.     sulfamethoxazole -trimethoprim  (BACTRIM  DS) 800-160 MG tablet Take 1 tablet by mouth 2 (two) times daily. 20 tablet 0   No current facility-administered medications for this visit.   Facility-Administered Medications Ordered in Other Visits  Medication Dose Route Frequency Provider Last Rate Last Admin   sodium chloride  flush (NS) 0.9 % injection 10 mL  10 mL Intravenous PRN Thomas, Lisa K, NP   10 mL at 12/18/23 9074   Allergies[2]   Review of Systems: All systems reviewed and negative except where noted in HPI.   Lab Results  Component Value Date   WBC 4.4 07/18/2024   HGB 13.0 07/18/2024   HCT 37.8 07/18/2024   MCV  93.3 07/18/2024   PLT 235 07/18/2024    Lab Results  Component Value Date   NA 131 (L) 07/18/2024   CL 96 (L) 07/18/2024   K 4.1 07/18/2024   CO2 24 07/18/2024   BUN 7 (L) 07/18/2024   CREATININE 0.75 07/18/2024   GFRNONAA >60 07/18/2024   CALCIUM 9.6 07/18/2024   ALBUMIN 3.9 07/18/2024   GLUCOSE 106 (H) 07/18/2024     Lab Results  Component Value Date   ALT 8 07/18/2024   AST 15 07/18/2024   ALKPHOS 73 07/18/2024   BILITOT 0.5 07/18/2024     Physical Exam: BP 114/80   Pulse 68   Ht 5' 1 (1.549 m)   Wt 162 lb 3.2 oz (73.6 kg)   BMI 30.65 kg/m  Constitutional: Pleasant,well-developed, female in no acute distress. Neurological: Alert and oriented to person place and time. Psychiatric: Normal mood and affect. Behavior is normal.   ASSESSMENT: 74 y.o. female here for assessment of the following  1. Nausea and vomiting, unspecified vomiting type   2. Barrett's esophagus without dysplasia   3. Hiatal hernia   4. Diarrhea, unspecified type   5. Endometrioid adenocarcinoma of uterus (HCC)    Ongoing nausea with occasional vomiting.  Symptoms are not as bad as they used to be but still bother her periodically.  She has an intolerance to Zofran .  Compazine  used to work better but does not work as well as it used to.  Interestingly she takes hyoscyamine  to treat this and that really seems to help her.  She can certainly continue that for now.  Upper endoscopy did reveal a very large hiatal hernia and Barrett's esophagus.  We reviewed her hiatal hernia.  She likely has had silent reflux symptoms for some time, she does not endorse any significant pyrosis, regurgitation.  Only nausea.  Reviewed long segment of Barrett's esophagus, what this is, long-term risks for esophageal cancer.  I do recommend attempt at taking PPI if possible, she has some nonspecific intolerance/jittery feelings.  Would like to put her on a trial of low-dose Protonix  20 mg once daily just to see if she could tolerate this, that I think would be much better for Barrett's esophagus as opposed to Pepcid .  If she tries yet another PPI and has similar intolerance would forego further attempts with that, could consider trial of Voquenza, or leave her on Pepcid  although counseled her a stronger antacid would be better for treatment of  her Barrett's.  Perhaps this would also help her nausea.  She is on chemotherapy and suspect her nausea very well may be related to that and her underlying malignancy.  I do not recommend surgery for her large hiatal hernia given her comorbidities.  Would continue medical therapy for now. It is possible she also may have some underlying gastroparesis, discussed that possibility.  Discussed if he wanted to pursue GES versus empiric trial of Reglan .  She states she is very sensitive to medicines, after discussion of Reglan  she wanted to avoid that and hold off on that for now as she is otherwise seems stable right now.  Her CT scan chest abdomen pelvis for staging of her malignancy remains pending, she will need to follow that up with oncology to exclude progressive disease as cause of her symptoms otherwise.  Regarding her loose stools, Lomotil  works well and in fact can constipate her.  She would prefer to use Pepto-Bismol which does help when she takes it but not as strong.  She can certainly take that daily or as needed, depending on how much this bothers her.  We discussed other options, holding off on Colestid or other options for now given she is pretty stable in this regard.  No concerning findings on colonoscopy.  Otherwise, we discussed consideration for repeat EGD in 3 years for surveillance of Barrett's.  Given her age and status of her malignancy at the time we will determine if we wish to pursue further surveillance endoscopy.  She understands this and agrees.  If she has any dysphagia moving forward or more concerning alarm symptoms he will let me know   PLAN: - suspect nausea from chemo / underlying malignancy, but large HH /silent reflux could also be contributing - hyocyamine helps her, she can take it more frequently as needed - declines Zofran  (intolerance) - discussed possible Reglan , she wants to hold off - discussed Barrett's, risks for EAC - recommend another trial of PPI,  intolerant to omeprazole  / nexium in the past. Trial of protonix  20mg  / day for 30 day trial. If intolerant, I asked her to let me know, so we can try Voquezna or resume pepcid  based on tolerance - lomotil  causes constipation, use PRN for severe symptoms. Colonoscopy reassuring - continue pepto PRN and can use more frequently - holding off on colestid or other options for now - await staging CT scans per oncology - consideration for surveillance EGD in 3 years - given age and status of malignancy will determine if we pursue that  I spent 35 minutes of time, including in depth chart review, face-to-face time with the patient, and documentation.   Marcey Naval, MD [facility]     [1]  Social History Tobacco Use   Smoking status: Never   Smokeless tobacco: Never  Vaping Use   Vaping status: Never Used  Substance Use Topics   Alcohol  use: No    Alcohol /week: 0.0 standard drinks of alcohol    Drug use: No  [2]  Allergies Allergen Reactions   Tetracycline Nausea And Vomiting    Flushed face, sick feeling   Garlic Diarrhea and Other (See Comments)    Claims must have organic   Other Diarrhea and Other (See Comments)    DAIRY PRODUCTS REDNESS Claims must have organic   Oxycodone  Nausea And Vomiting    Other reaction(s): GI Upset,   Penicillins Rash    Has patient had a PCN reaction causing immediate rash, facial/tongue/throat swelling, SOB or lightheadedness with hypotension: Yes Has patient had a PCN reaction causing severe rash involving mucus membranes or skin necrosis: No Has patient had a PCN reaction that required hospitalization No Has patient had a PCN reaction occurring within the last 10 years: Yes If all of the above answers are NO, then may proceed with Cephalosporin use.    "

## 2024-07-23 NOTE — Patient Instructions (Addendum)
 Ok to take hyoscyamine  on a schedule,  instead of as needed.  We have sent the following medications to your pharmacy for you to pick up at your convenience: Pantoprazole  20 fh:Ujxz once daily.  Please let us  know if this is not helpful and we can try something else  Continue Pepto- Bismol   You will be due for an EGD in 04-2027.  We will let you know when it is time to discuss scheduling if appropriate.  Thank you for entrusting me with your care and for choosing [facility], Dr. Elspeth Naval    _______________________________________________________  If your blood pressure at your visit was 140/90 or greater, please contact your primary care physician to follow up on this.  _______________________________________________________  If you are age 73 or older, your body mass index should be between 23-30. Your Body mass index is 30.65 kg/m. If this is out of the aforementioned range listed, please consider follow up with your Primary Care Provider.  If you are age 4 or younger, your body mass index should be between 19-25. Your Body mass index is 30.65 kg/m. If this is out of the aformentioned range listed, please consider follow up with your Primary Care Provider.   ________________________________________________________  The [facility] providers would like to encourage you to use MYCHART to communicate with providers for non-urgent requests or questions.  Due to long hold times on the telephone, sending your provider a message by [facility] may be a faster and more efficient way to get a response.  Please allow 48 business hours for a response.  Please remember that this is for non-urgent requests.  _______________________________________________________  [facility] is using a team-based approach to care.  Your team is made up of your doctor and two to three APPS. Our APPS (Nurse Practitioners and Physician Assistants) work with your physician to ensure care  continuity for you. They are fully qualified to address your health concerns and develop a treatment plan. They communicate directly with your gastroenterologist to care for you. Seeing the Advanced Practice Practitioners on your physician's team can help you by facilitating care more promptly, often allowing for earlier appointments, access to diagnostic testing, procedures, and other specialty referrals.

## 2024-07-26 ENCOUNTER — Other Ambulatory Visit: Payer: Self-pay | Admitting: Oncology

## 2024-07-29 ENCOUNTER — Inpatient Hospital Stay

## 2024-07-29 ENCOUNTER — Inpatient Hospital Stay: Admitting: Oncology

## 2024-07-29 VITALS — BP 131/80 | HR 88 | Temp 98.0°F | Resp 18 | Ht 61.0 in | Wt 164.0 lb

## 2024-07-29 DIAGNOSIS — C55 Malignant neoplasm of uterus, part unspecified: Secondary | ICD-10-CM | POA: Diagnosis not present

## 2024-07-29 DIAGNOSIS — Z7189 Other specified counseling: Secondary | ICD-10-CM

## 2024-07-29 DIAGNOSIS — C541 Malignant neoplasm of endometrium: Secondary | ICD-10-CM | POA: Diagnosis not present

## 2024-07-29 LAB — CMP (CANCER CENTER ONLY)
ALT: 8 U/L (ref 0–44)
AST: 15 U/L (ref 15–41)
Albumin: 3.7 g/dL (ref 3.5–5.0)
Alkaline Phosphatase: 69 U/L (ref 38–126)
Anion gap: 11 (ref 5–15)
BUN: 10 mg/dL (ref 8–23)
CO2: 24 mmol/L (ref 22–32)
Calcium: 9.2 mg/dL (ref 8.9–10.3)
Chloride: 100 mmol/L (ref 98–111)
Creatinine: 0.87 mg/dL (ref 0.44–1.00)
GFR, Estimated: >60 mL/min
Glucose, Bld: 92 mg/dL (ref 70–99)
Potassium: 4.3 mmol/L (ref 3.5–5.1)
Sodium: 134 mmol/L — ABNORMAL LOW (ref 135–145)
Total Bilirubin: <0.2 mg/dL (ref 0.0–1.2)
Total Protein: 6.2 g/dL — ABNORMAL LOW (ref 6.5–8.1)

## 2024-07-29 LAB — CBC WITH DIFFERENTIAL (CANCER CENTER ONLY)
Abs Immature Granulocytes: 0.01 K/uL (ref 0.00–0.07)
Basophils Absolute: 0 K/uL (ref 0.0–0.1)
Basophils Relative: 1 %
Eosinophils Absolute: 0 K/uL (ref 0.0–0.5)
Eosinophils Relative: 1 %
HCT: 35.5 % — ABNORMAL LOW (ref 36.0–46.0)
Hemoglobin: 11.9 g/dL — ABNORMAL LOW (ref 12.0–15.0)
Immature Granulocytes: 0 %
Lymphocytes Relative: 23 %
Lymphs Abs: 0.8 K/uL (ref 0.7–4.0)
MCH: 31.8 pg (ref 26.0–34.0)
MCHC: 33.5 g/dL (ref 30.0–36.0)
MCV: 94.9 fL (ref 80.0–100.0)
Monocytes Absolute: 0.5 K/uL (ref 0.1–1.0)
Monocytes Relative: 17 %
Neutro Abs: 1.9 K/uL (ref 1.7–7.7)
Neutrophils Relative %: 58 %
Platelet Count: 283 K/uL (ref 150–400)
RBC: 3.74 MIL/uL — ABNORMAL LOW (ref 3.87–5.11)
RDW: 14.6 % (ref 11.5–15.5)
WBC Count: 3.3 K/uL — ABNORMAL LOW (ref 4.0–10.5)
nRBC: 0 % (ref 0.0–0.2)

## 2024-07-29 NOTE — Patient Instructions (Signed)

## 2024-07-29 NOTE — Progress Notes (Signed)
 " [facility] OFFICE PROGRESS NOTE   Diagnosis: Uterine cancer  INTERVAL HISTORY:   Dana [name] returns as scheduled.  The skin rash is fading, but remains painful.  She has developed pain at the base of the left great toe since 07/26/2024.  She continues Bactrim .  The right groin mass is unchanged.  Objective:  Vital signs in last 24 hours:  Blood pressure 131/80, pulse 88, temperature 98 F (36.7 C), temperature source Temporal, resp. rate 18, height 5' 1 (1.549 m), weight 164 lb (74.4 kg), SpO2 98%.    HEENT: No thrush or ulcers Resp: Lungs clear bilaterally Cardio: Regular rate and rhythm GI: No hepatosplenomegaly, firm fungating mass in the right groin Vascular: Edema throughout the left leg  Skin: Mild erythema and skin thickening at the first, erythema with skin thickening and superficial desquamation at the soles, erythema with mild fluctuance at the base of the right great toenail, 3 cm mass at the right anterior thigh  Portacath/PICC-without erythema  Lab Results:  Lab Results  Component Value Date   WBC 3.3 (L) 07/29/2024   HGB 11.9 (L) 07/29/2024   HCT 35.5 (L) 07/29/2024   MCV 94.9 07/29/2024   PLT 283 07/29/2024   NEUTROABS 1.9 07/29/2024    CMP  Lab Results  Component Value Date   NA 134 (L) 07/29/2024   K 4.3 07/29/2024   CL 100 07/29/2024   CO2 24 07/29/2024   GLUCOSE 92 07/29/2024   BUN 10 07/29/2024   CREATININE 0.87 07/29/2024   CALCIUM 9.2 07/29/2024   PROT 6.2 (L) 07/29/2024   ALBUMIN 3.7 07/29/2024   AST 15 07/29/2024   ALT 8 07/29/2024   ALKPHOS 69 07/29/2024   BILITOT <0.2 07/29/2024   GFRNONAA >60 07/29/2024   GFRAA >60 04/06/2020     Medications: I have reviewed the patient's current medications.   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, status post a TAH, BSO, appendectomy, pelvic and periaortic lymphadenectomy, and wedge resection of a liver lesion Gross  complete resection, tumor involve both ovaries, lymph nodes negative 6 cycles of adjuvant Taxol /carboplatin  CT abdomen/pelvis 03/28/2019-enlarged right inguinal and iliac nodes 04/10/2019-right inguinal lymph node biopsy-metastatic adenocarcinoma, ER 90%, PR 95%, PD-L1 combined positive score-1, MSS, tumor mutation burden-4 Cisplatin  05/16/2019 20-1 cycle Radiation to the lateral pelvis and 1 lymph nodes November 2020 CT 12/72/020-decrease size of right inguinal and external iliac lymph nodes, no evidence of disease progression Everolimus  and letrozole  07/14/2019 - 04/06/2020 CT 10/20/2019-resolution of right iliac lymph node, decrease in size of right inguinal node CT 03/08/2020-no evidence of recurrent or metastatic disease in the abdomen or pelvis CT 05/31/2020-no evidence of recurrent or metastatic disease CT 03/11/2021-increase right inguinal lymph node, enlargement of bilateral inguinal lymph nodes, subcutaneous soft tissue nodule at the anterior right thigh Carboplatin  04/06/2021, 04/25/2021, 05/16/2021 CT 07/12/2021-mild decrease in size of bilateral inguinal lymphadenopathy, no progressive disease in the abdomen or pelvis, 0.6 cm left inguinal node was 0.8 cm previously Letrozole  08/06/2021 CT abdomen/pelvis 04/10/2022-stable right inguinal lymph node, no new site of metastatic disease CT right femur  04/10/2022 -2.2 x 1 cm anterior subcutaneous mass Letrozole  continued CT right femur 07/19/2022-mild enlargement of right thigh subcutaneous mass stable right inguinal nodal mass CTs 07/19/2022-stable right inguinal lymph node, no new or progressive disease 11/20/2022-right thigh mass larger and tender, letrozole  discontinued 11/28/2022-significant enlargement of anterior right thigh soft tissue mass, slightly larger right inguinal mass 12/25/2022-wide local excision of right thigh mass, metastatic  endometrial adenocarcinoma, ER positive, PR positive, Her 2 negative(0),negative resection margins Cycle 1  pembrolizumab /lenvatinib  02/05/2023 Cycle 2 Pembrolizumab  02/27/2023, lenvatinib  placed on hold due to mucositis and hypertension Cycle 3 Pembrolizumab  03/20/2023, lenvatinib  resumed at a dose of 10 mg daily Lenvatinib  discontinued 03/30/2023 due to mucositis, hand-foot symptoms Afinitor /letrozole  04/21/2023 CTs 08/10/2023: Increased nodular tissue anterior to the right hip, new 6 mm right lung nodule CT right femur 08/10/2023: No evidence of recurrent tumor at the right anterior thigh, enlargement of right inguinal soft tissue mass with an additional enlarging mass inferiorly Cycle 1 Carboplatin  11/06/2023 Cycle 2 carboplatin  11/26/2023, dose reduction to AUC 4 due to f mild thrombocytopenia, home Decadron  prophylaxis Treatment held due to neutropenia 12/18/2023 Cycle 3 carboplatin  12/25/2023, dose reduction due to neutropenia following cycle 2, patient declines white cell growth factor support Cycle 4 carboplatin  01/14/2024 CT right femur 02/03/2024-enlarging right inguinal mass CTs 02/03/2024: Previous right lower lobe nodule now measures 0.2 x 0.3 cm-favor benign versus treatment response, increased masslike thickening in the right groin 03/25/24: neogenomics: FOLR1 negative (5%) 04/23/2024 echocardiogram: LVEF 60-65% Cycle 1 Doxil  05/06/2024 Cycle 2 Doxil  06/03/2024 Cycle 3 Doxil  07/01/2024 07/20/2024 CT right femur: Mild increase in size of right inguinal mass, interval subcutaneous nodule in the lateral proximal right thigh   2.  Left lower extremity lymphedema, improved after therapy in the lymphedema clinic   3.  Chronic diarrhea controlled with Lomotil  05/12/2024 upper endoscopy: Hiatal hernia, Barrett's esophagus-no dysplasia, duodenal diverticulum 05/12/2024 colonoscopy: Diverticula, ascending colon polyp removed-tubular adenoma      Disposition: Dana [name] has metastatic endometrial cancer.  She has completed 3 cycles of Doxil .  The treatment is complicated by a rash and hand/foot syndrome.   The rash appears to be fading.  I suspect the inflammation at the base of the right great toe is related to Doxil  as opposed to an infection.  The right groin mass appears increased in size over the past several months.  There was a significant delay between the baseline July CT and the start of Doxil  05/06/2024.  I reviewed the CT images of the study.  However regardless of the delay from the baseline scan the right groin mass and thigh mass appear increased in size.  She is having significant toxicity from Doxil .  Doxil  will be discontinued.  We are waiting on a final reading of the abdomen/pelvis CT.  I will contact Dr. Viktoria to review salvage options including clinical trials at [facility].  Dana [name] will return for an office visit 08/06/2024.  Arley Hof, MD  07/29/2024  10:26 AM   "

## 2024-07-30 NOTE — Progress Notes (Addendum)
 Dana [name]                                          MRN: [id number]   08/04/2024   The [facility] Quality Team Specialist reviewed this patient medical record for the purposes of chart review for care gap closure. The following were reviewed: Abstracted Colonoscopy.    [facility] Quality Team

## 2024-08-01 ENCOUNTER — Ambulatory Visit: Payer: Self-pay | Admitting: Oncology

## 2024-08-06 ENCOUNTER — Other Ambulatory Visit (HOSPITAL_BASED_OUTPATIENT_CLINIC_OR_DEPARTMENT_OTHER): Payer: Self-pay

## 2024-08-06 ENCOUNTER — Other Ambulatory Visit: Payer: Self-pay

## 2024-08-06 ENCOUNTER — Inpatient Hospital Stay: Admitting: Oncology

## 2024-08-06 VITALS — BP 143/86 | HR 96 | Temp 98.1°F | Resp 18 | Ht 61.0 in | Wt 165.6 lb

## 2024-08-06 DIAGNOSIS — C55 Malignant neoplasm of uterus, part unspecified: Secondary | ICD-10-CM | POA: Diagnosis not present

## 2024-08-06 DIAGNOSIS — C541 Malignant neoplasm of endometrium: Secondary | ICD-10-CM | POA: Diagnosis not present

## 2024-08-06 MED ORDER — MEGESTROL ACETATE 40 MG PO TABS
160.0000 mg | ORAL_TABLET | Freq: Every day | ORAL | 2 refills | Status: AC
  Filled 2024-08-06: qty 72, 18d supply, fill #0
  Filled 2024-08-06: qty 12, 3d supply, fill #0

## 2024-08-06 MED ORDER — TAMOXIFEN CITRATE 20 MG PO TABS
40.0000 mg | ORAL_TABLET | Freq: Every day | ORAL | 2 refills | Status: AC
  Filled 2024-08-06: qty 42, 21d supply, fill #0

## 2024-08-06 NOTE — Progress Notes (Signed)
 Provided Ms. Petree printed information on Megace  and Tamoxifen  and how to take. Scripts sent to [facility]. They have enough Megace  for partial fill and can order more.

## 2024-08-06 NOTE — Progress Notes (Signed)
 "  [facility] OFF[facility] PROGRESS [facility]   Diagnosis: Uterine cancer  INTERVAL HISTORY:   Dana [name] returns as scheduled.  She reports soreness at the right thigh mass.  The right groin lesion bleeds at night.  The feet remain erythematous.  The rash over the trunk is resolving.  Objective:  Vital signs in last 24 hours:  Blood pressure (!) 143/86, pulse 96, temperature 98.1 F (36.7 C), temperature source Temporal, resp. rate 18, height 5' 1 (1.549 m), weight 165 lb 9.6 oz (75.1 kg), SpO2 99%.    Resp: Lungs clear bilaterally Cardio: Regular rate and rhythm GI: Nontender, fungating firm mass in the right inguinal region with erythema and edema extending to the right labia, no bleeding Skin: Fading rash over the trunk, mild skin thickening over the palms, erythema and desquamation at the soles, 3 cm right anterior thigh mass  Portacath/PICC-without erythema  Lab Results:  Lab Results  Component Value Date   WBC 3.3 (L) 07/29/2024   HGB 11.9 (L) 07/29/2024   HCT 35.5 (L) 07/29/2024   MCV 94.9 07/29/2024   PLT 283 07/29/2024   NEUTROABS 1.9 07/29/2024    CMP  Lab Results  Component Value Date   NA 134 (L) 07/29/2024   K 4.3 07/29/2024   CL 100 07/29/2024   CO2 24 07/29/2024   GLUCOSE 92 07/29/2024   BUN 10 07/29/2024   CREATININE 0.87 07/29/2024   CALCIUM 9.2 07/29/2024   PROT 6.2 (L) 07/29/2024   ALBUMIN 3.7 07/29/2024   AST 15 07/29/2024   ALT 8 07/29/2024   ALKPHOS 69 07/29/2024   BILITOT <0.2 07/29/2024   GFRNONAA >60 07/29/2024   GFRAA >60 04/06/2020    Medications: I have reviewed the patient's current medications.   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, status post a TAH, BSO, appendectomy, pelvic and periaortic lymphadenectomy, and wedge resection of a liver lesion Gross complete resection, tumor involve both ovaries, lymph nodes negative 6 cycles of adjuvant Taxol /carboplatin  CT  abdomen/pelvis 03/28/2019-enlarged right inguinal and iliac nodes 04/10/2019-right inguinal lymph node biopsy-metastatic adenocarcinoma, ER 90%, PR 95%, PD-L1 combined positive score-1, MSS, tumor mutation burden-4 Cisplatin  05/16/2019 20-1 cycle Radiation to the lateral pelvis and 1 lymph nodes November 2020 CT 12/72/020-decrease size of right inguinal and external iliac lymph nodes, no evidence of disease progression Everolimus  and letrozole  07/14/2019 - 04/06/2020 CT 10/20/2019-resolution of right iliac lymph node, decrease in size of right inguinal node CT 03/08/2020-no evidence of recurrent or metastatic disease in the abdomen or pelvis CT 05/31/2020-no evidence of recurrent or metastatic disease CT 03/11/2021-increase right inguinal lymph node, enlargement of bilateral inguinal lymph nodes, subcutaneous soft tissue nodule at the anterior right thigh Carboplatin  04/06/2021, 04/25/2021, 05/16/2021 CT 07/12/2021-mild decrease in size of bilateral inguinal lymphadenopathy, no progressive disease in the abdomen or pelvis, 0.6 cm left inguinal node was 0.8 cm previously Letrozole  08/06/2021 CT abdomen/pelvis 04/10/2022-stable right inguinal lymph node, no new site of metastatic disease CT right femur  04/10/2022 -2.2 x 1 cm anterior subcutaneous mass Letrozole  continued CT right femur 07/19/2022-mild enlargement of right thigh subcutaneous mass stable right inguinal nodal mass CTs 07/19/2022-stable right inguinal lymph node, no new or progressive disease 11/20/2022-right thigh mass larger and tender, letrozole  discontinued 11/28/2022-significant enlargement of anterior right thigh soft tissue mass, slightly larger right inguinal mass 12/25/2022-wide local excision of right thigh mass, metastatic endometrial adenocarcinoma, ER positive, PR positive, Her 2 negative(0),negative resection margins Cycle 1 pembrolizumab /lenvatinib  02/05/2023 Cycle 2 Pembrolizumab   02/27/2023, lenvatinib  placed on hold due to mucositis and  hypertension Cycle 3 Pembrolizumab  03/20/2023, lenvatinib  resumed at a dose of 10 mg daily Lenvatinib  discontinued 03/30/2023 due to mucositis, hand-foot symptoms Afinitor /letrozole  04/21/2023 CTs 08/10/2023: Increased nodular tissue anterior to the right hip, new 6 mm right lung nodule CT right femur 08/10/2023: No evidence of recurrent tumor at the right anterior thigh, enlargement of right inguinal soft tissue mass with an additional enlarging mass inferiorly Cycle 1 Carboplatin  11/06/2023 Cycle 2 carboplatin  11/26/2023, dose reduction to AUC 4 due to f mild thrombocytopenia, home Decadron  prophylaxis Treatment held due to neutropenia 12/18/2023 Cycle 3 carboplatin  12/25/2023, dose reduction due to neutropenia following cycle 2, patient declines white cell growth factor support Cycle 4 carboplatin  01/14/2024 CT right femur 02/03/2024-enlarging right inguinal mass CTs 02/03/2024: Previous right lower lobe nodule now measures 0.2 x 0.3 cm-favor benign versus treatment response, increased masslike thickening in the right groin 03/25/24: neogenomics: FOLR1 negative (5%) 04/23/2024 echocardiogram: LVEF 60-65% Cycle 1 Doxil  05/06/2024 Cycle 2 Doxil  06/03/2024 Cycle 3 Doxil  07/01/2024 07/20/2024 CT right femur: Mild increase in size of right inguinal mass, interval subcutaneous nodule in the lateral proximal right thigh 07/20/2024 CT Abdo/pelvis: Progressive masslike thickening of the right groin   2.  Left lower extremity lymphedema, improved after therapy in the lymphedema clinic   3.  Chronic diarrhea controlled with Lomotil  05/12/2024 upper endoscopy: Hiatal hernia, Barrett's esophagus-no dysplasia, duodenal diverticulum 05/12/2024 colonoscopy: Diverticula, ascending colon polyp removed-tubular adenoma      Disposition: Dana [name] has metastatic endometrial cancer.  There is clinical and radiologic evidence of disease progression.  We discussed treatment options again today.  She does not wish to consider  radiation.  She is reluctant to consider surgery, especially since the right thigh mass recurred after resection in 2024.  Chemotherapy options are limited.  She has been treated with carboplatin  and Doxil .  She does not wish to receive paclitaxel  due to severe neuropathy when she last received paclitaxel .  I  I messaged Dr. Viktoria.  She recommends hormonal therapy.  I recommend Megace  alternating with tamoxifen .  We reviewed potential toxicities associated with Megace  including the chance of weight gain, fluid retention, and thromboembolic disease.  We discussed the thromboembolism, hot flashes, and hair loss associated with tamoxifen .  She agrees to proceed.  She will begin a trial of alternating Megace /tamoxifen .  She will return for an office visit in 4 weeks.  She will call for increased bleeding at the right groin mass.  Arley Hof, MD  08/06/2024  2:43 PM   "

## 2024-08-26 ENCOUNTER — Inpatient Hospital Stay: Admitting: Nurse Practitioner

## 2024-08-26 ENCOUNTER — Inpatient Hospital Stay

## 2024-09-03 ENCOUNTER — Inpatient Hospital Stay: Admitting: Nurse Practitioner

## 2024-09-03 ENCOUNTER — Inpatient Hospital Stay

## 2025-06-16 ENCOUNTER — Inpatient Hospital Stay

## 2025-07-01 ENCOUNTER — Inpatient Hospital Stay: Admitting: Oncology
